# Patient Record
Sex: Male | Born: 1935
Health system: Southern US, Community
[De-identification: ages and names within clinical notes are randomized; demographics above are authoritative.]

## PROBLEM LIST (undated history)

## (undated) DIAGNOSIS — I472 Ventricular tachycardia: Secondary | ICD-10-CM

## (undated) DIAGNOSIS — Z955 Presence of coronary angioplasty implant and graft: Secondary | ICD-10-CM

## (undated) DIAGNOSIS — R319 Hematuria, unspecified: Secondary | ICD-10-CM

## (undated) DIAGNOSIS — Z950 Presence of cardiac pacemaker: Secondary | ICD-10-CM

## (undated) DIAGNOSIS — D494 Neoplasm of unspecified behavior of bladder: Secondary | ICD-10-CM

## (undated) DIAGNOSIS — I739 Peripheral vascular disease, unspecified: Secondary | ICD-10-CM

## (undated) DIAGNOSIS — H409 Unspecified glaucoma: Secondary | ICD-10-CM

## (undated) DIAGNOSIS — I519 Heart disease, unspecified: Secondary | ICD-10-CM

## (undated) DIAGNOSIS — C8590 Non-Hodgkin lymphoma, unspecified, unspecified site: Secondary | ICD-10-CM

## (undated) DIAGNOSIS — I10 Essential (primary) hypertension: Secondary | ICD-10-CM

## (undated) DIAGNOSIS — H269 Unspecified cataract: Secondary | ICD-10-CM

## (undated) DIAGNOSIS — K219 Gastro-esophageal reflux disease without esophagitis: Secondary | ICD-10-CM

## (undated) DIAGNOSIS — I2510 Atherosclerotic heart disease of native coronary artery without angina pectoris: Secondary | ICD-10-CM

## (undated) DIAGNOSIS — M1990 Unspecified osteoarthritis, unspecified site: Secondary | ICD-10-CM

## (undated) DIAGNOSIS — Z951 Presence of aortocoronary bypass graft: Secondary | ICD-10-CM

## (undated) DIAGNOSIS — I442 Atrioventricular block, complete: Secondary | ICD-10-CM

## (undated) DIAGNOSIS — Z973 Presence of spectacles and contact lenses: Secondary | ICD-10-CM

## (undated) DIAGNOSIS — I252 Old myocardial infarction: Secondary | ICD-10-CM

## (undated) DIAGNOSIS — E785 Hyperlipidemia, unspecified: Secondary | ICD-10-CM

## (undated) DIAGNOSIS — I779 Disorder of arteries and arterioles, unspecified: Secondary | ICD-10-CM

## (undated) DIAGNOSIS — I255 Ischemic cardiomyopathy: Secondary | ICD-10-CM

## (undated) HISTORY — PX: CORONARY ARTERY BYPASS GRAFT: SHX141

## (undated) HISTORY — DX: Peripheral vascular disease, unspecified: I73.9

## (undated) HISTORY — PX: POPLITEAL ARTERY STENT: SHX2243

## (undated) HISTORY — PX: SALIVARY GLAND SURGERY: SHX768

## (undated) HISTORY — DX: Essential (primary) hypertension: I10

## (undated) HISTORY — PX: CARDIAC PACEMAKER PLACEMENT: SHX583

## (undated) HISTORY — DX: Disorder of arteries and arterioles, unspecified: I77.9

## (undated) HISTORY — DX: Hyperlipidemia, unspecified: E78.5

## (undated) HISTORY — PX: ANAL FISSURE REPAIR: SHX2312

## (undated) HISTORY — DX: Atherosclerotic heart disease of native coronary artery without angina pectoris: I25.10

## (undated) HISTORY — PX: CARDIOVASCULAR STRESS TEST: SHX262

## (undated) HISTORY — DX: Heart disease, unspecified: I51.9

## (undated) HISTORY — DX: Ventricular tachycardia: I47.2

## (undated) HISTORY — PX: CORONARY ANGIOPLASTY WITH STENT PLACEMENT: SHX49

## (undated) HISTORY — PX: OTHER SURGICAL HISTORY: SHX169

---

## 1998-10-17 ENCOUNTER — Ambulatory Visit (HOSPITAL_COMMUNITY): Admission: RE | Admit: 1998-10-17 | Discharge: 1998-10-17 | Payer: Self-pay | Admitting: Cardiology

## 2000-03-13 ENCOUNTER — Encounter: Payer: Self-pay | Admitting: Cardiology

## 2000-03-13 ENCOUNTER — Encounter: Admission: RE | Admit: 2000-03-13 | Discharge: 2000-03-13 | Payer: Self-pay | Admitting: Cardiology

## 2000-04-09 ENCOUNTER — Ambulatory Visit (HOSPITAL_COMMUNITY): Admission: RE | Admit: 2000-04-09 | Discharge: 2000-04-09 | Payer: Self-pay | Admitting: Cardiology

## 2000-04-09 ENCOUNTER — Encounter: Payer: Self-pay | Admitting: Cardiology

## 2000-04-09 HISTORY — PX: CARDIAC CATHETERIZATION: SHX172

## 2000-05-01 ENCOUNTER — Encounter (HOSPITAL_COMMUNITY): Admission: RE | Admit: 2000-05-01 | Discharge: 2000-07-30 | Payer: Self-pay | Admitting: Cardiology

## 2000-10-21 ENCOUNTER — Encounter (HOSPITAL_COMMUNITY): Admission: RE | Admit: 2000-10-21 | Discharge: 2001-01-19 | Payer: Self-pay | Admitting: Cardiology

## 2000-12-09 ENCOUNTER — Ambulatory Visit (HOSPITAL_COMMUNITY): Admission: RE | Admit: 2000-12-09 | Discharge: 2000-12-09 | Payer: Self-pay | Admitting: General Surgery

## 2000-12-31 ENCOUNTER — Ambulatory Visit (HOSPITAL_COMMUNITY): Admission: RE | Admit: 2000-12-31 | Discharge: 2000-12-31 | Payer: Self-pay | Admitting: Cardiovascular Disease

## 2000-12-31 ENCOUNTER — Encounter: Payer: Self-pay | Admitting: Cardiovascular Disease

## 2006-01-01 ENCOUNTER — Encounter: Admission: RE | Admit: 2006-01-01 | Discharge: 2006-01-01 | Payer: Self-pay | Admitting: Cardiology

## 2006-01-04 ENCOUNTER — Ambulatory Visit (HOSPITAL_COMMUNITY): Admission: RE | Admit: 2006-01-04 | Discharge: 2006-01-05 | Payer: Self-pay | Admitting: Cardiology

## 2006-01-04 DIAGNOSIS — Z955 Presence of coronary angioplasty implant and graft: Secondary | ICD-10-CM

## 2006-01-04 HISTORY — DX: Presence of coronary angioplasty implant and graft: Z95.5

## 2008-04-27 ENCOUNTER — Encounter: Admission: RE | Admit: 2008-04-27 | Discharge: 2008-04-27 | Payer: Self-pay | Admitting: Cardiovascular Disease

## 2008-05-04 ENCOUNTER — Ambulatory Visit (HOSPITAL_COMMUNITY): Admission: RE | Admit: 2008-05-04 | Discharge: 2008-05-04 | Payer: Self-pay | Admitting: Cardiovascular Disease

## 2008-08-04 ENCOUNTER — Inpatient Hospital Stay (HOSPITAL_COMMUNITY): Admission: RE | Admit: 2008-08-04 | Discharge: 2008-08-05 | Payer: Self-pay | Admitting: Cardiology

## 2009-05-11 ENCOUNTER — Encounter: Admission: RE | Admit: 2009-05-11 | Discharge: 2009-05-11 | Payer: Self-pay | Admitting: Family Medicine

## 2009-05-17 ENCOUNTER — Other Ambulatory Visit: Admission: RE | Admit: 2009-05-17 | Discharge: 2009-05-17 | Payer: Self-pay | Admitting: Otolaryngology

## 2009-06-22 ENCOUNTER — Ambulatory Visit: Payer: Self-pay | Admitting: Oncology

## 2009-06-23 LAB — CBC WITH DIFFERENTIAL/PLATELET
Basophils Absolute: 0 10*3/uL (ref 0.0–0.1)
Eosinophils Absolute: 0.3 10*3/uL (ref 0.0–0.5)
HGB: 13.3 g/dL (ref 13.0–17.1)
MCV: 91.5 fL (ref 79.3–98.0)
MONO#: 0.7 10*3/uL (ref 0.1–0.9)
NEUT#: 3 10*3/uL (ref 1.5–6.5)
Platelets: 197 10*3/uL (ref 140–400)
RBC: 4.19 10*6/uL — ABNORMAL LOW (ref 4.20–5.82)
RDW: 13.4 % (ref 11.0–14.6)
WBC: 5.3 10*3/uL (ref 4.0–10.3)

## 2009-06-23 LAB — COMPREHENSIVE METABOLIC PANEL
Albumin: 4.4 g/dL (ref 3.5–5.2)
BUN: 18 mg/dL (ref 6–23)
CO2: 29 mEq/L (ref 19–32)
Calcium: 9.4 mg/dL (ref 8.4–10.5)
Glucose, Bld: 103 mg/dL — ABNORMAL HIGH (ref 70–99)
Potassium: 4.1 mEq/L (ref 3.5–5.3)
Sodium: 140 mEq/L (ref 135–145)
Total Protein: 7.5 g/dL (ref 6.0–8.3)

## 2009-06-23 LAB — LACTATE DEHYDROGENASE: LDH: 181 U/L (ref 94–250)

## 2009-06-27 ENCOUNTER — Ambulatory Visit (HOSPITAL_COMMUNITY): Admission: RE | Admit: 2009-06-27 | Discharge: 2009-06-27 | Payer: Self-pay | Admitting: Oncology

## 2009-07-28 ENCOUNTER — Ambulatory Visit: Payer: Self-pay | Admitting: Oncology

## 2009-08-06 HISTORY — PX: SUPERFICIAL LYMPH NODE BIOPSY / EXCISION: SUR127

## 2009-09-27 ENCOUNTER — Ambulatory Visit: Payer: Self-pay | Admitting: Oncology

## 2009-09-28 LAB — COMPREHENSIVE METABOLIC PANEL
ALT: 24 U/L (ref 0–53)
BUN: 18 mg/dL (ref 6–23)
CO2: 26 mEq/L (ref 19–32)
Calcium: 9.3 mg/dL (ref 8.4–10.5)
Chloride: 102 mEq/L (ref 96–112)
Creatinine, Ser: 1.1 mg/dL (ref 0.40–1.50)
Total Bilirubin: 1 mg/dL (ref 0.3–1.2)

## 2009-09-28 LAB — URIC ACID: Uric Acid, Serum: 5.5 mg/dL (ref 4.0–7.8)

## 2009-09-28 LAB — CBC WITH DIFFERENTIAL/PLATELET
BASO%: 0.3 % (ref 0.0–2.0)
Basophils Absolute: 0 10*3/uL (ref 0.0–0.1)
HCT: 40.5 % (ref 38.4–49.9)
HGB: 14.2 g/dL (ref 13.0–17.1)
LYMPH%: 19.4 % (ref 14.0–49.0)
MCH: 31.9 pg (ref 27.2–33.4)
MCHC: 35.1 g/dL (ref 32.0–36.0)
MONO#: 0.6 10*3/uL (ref 0.1–0.9)
NEUT%: 62.4 % (ref 39.0–75.0)
Platelets: 164 10*3/uL (ref 140–400)
WBC: 4.8 10*3/uL (ref 4.0–10.3)

## 2009-09-28 LAB — HEPATITIS B SURFACE ANTIGEN: Hepatitis B Surface Ag: NEGATIVE

## 2009-09-28 LAB — LACTATE DEHYDROGENASE: LDH: 191 U/L (ref 94–250)

## 2009-09-28 LAB — HEPATITIS B CORE ANTIBODY, TOTAL: Hep B Core Total Ab: NEGATIVE

## 2009-10-14 ENCOUNTER — Ambulatory Visit (HOSPITAL_COMMUNITY): Admission: RE | Admit: 2009-10-14 | Discharge: 2009-10-14 | Payer: Self-pay | Admitting: Oncology

## 2009-10-14 LAB — CBC WITH DIFFERENTIAL/PLATELET
Basophils Absolute: 0 10*3/uL (ref 0.0–0.1)
EOS%: 9.5 % — ABNORMAL HIGH (ref 0.0–7.0)
Eosinophils Absolute: 0.3 10*3/uL (ref 0.0–0.5)
HGB: 11.5 g/dL — ABNORMAL LOW (ref 13.0–17.1)
MCH: 32.6 pg (ref 27.2–33.4)
NEUT#: 2.4 10*3/uL (ref 1.5–6.5)
RDW: 13.6 % (ref 11.0–14.6)
WBC: 3.4 10*3/uL — ABNORMAL LOW (ref 4.0–10.3)
lymph#: 0.4 10*3/uL — ABNORMAL LOW (ref 0.9–3.3)

## 2009-10-14 LAB — URINALYSIS, MICROSCOPIC - CHCC
Ketones: NEGATIVE mg/dL
Leukocyte Esterase: NEGATIVE
Nitrite: NEGATIVE
Protein: <30 mg/dL
RBC count: NEGATIVE (ref 0–2)
WBC, UA: NEGATIVE (ref 0–2)

## 2009-10-14 LAB — COMPREHENSIVE METABOLIC PANEL
AST: 34 U/L (ref 0–37)
Albumin: 4.1 g/dL (ref 3.5–5.2)
BUN: 18 mg/dL (ref 6–23)
Calcium: 8.6 mg/dL (ref 8.4–10.5)
Chloride: 101 mEq/L (ref 96–112)
Potassium: 4.7 mEq/L (ref 3.5–5.3)

## 2009-10-15 LAB — URINE CULTURE

## 2009-10-27 ENCOUNTER — Ambulatory Visit: Payer: Self-pay | Admitting: Oncology

## 2009-10-31 LAB — CBC WITH DIFFERENTIAL/PLATELET
BASO%: 1.2 % (ref 0.0–2.0)
EOS%: 5.5 % (ref 0.0–7.0)
HCT: 37.3 % — ABNORMAL LOW (ref 38.4–49.9)
LYMPH%: 18.9 % (ref 14.0–49.0)
MCH: 31.5 pg (ref 27.2–33.4)
MCHC: 35.1 g/dL (ref 32.0–36.0)
MCV: 89.7 fL (ref 79.3–98.0)
NEUT%: 57.4 % (ref 39.0–75.0)
Platelets: 170 10*3/uL (ref 140–400)

## 2009-10-31 LAB — COMPREHENSIVE METABOLIC PANEL
ALT: 42 U/L (ref 0–53)
AST: 40 U/L — ABNORMAL HIGH (ref 0–37)
BUN: 15 mg/dL (ref 6–23)
Creatinine, Ser: 0.97 mg/dL (ref 0.40–1.50)
Total Bilirubin: 1.2 mg/dL (ref 0.3–1.2)

## 2009-11-15 LAB — LIPID PANEL
HDL: 44 mg/dL (ref >39)
LDL Cholesterol: 45 mg/dL (ref 0–99)
Total CHOL/HDL Ratio: 2.7 Ratio

## 2009-11-15 LAB — CBC WITH DIFFERENTIAL/PLATELET
Eosinophils Absolute: 0.2 10*3/uL (ref 0.0–0.5)
HCT: 34.1 % — ABNORMAL LOW (ref 38.4–49.9)
LYMPH%: 13.1 % — ABNORMAL LOW (ref 14.0–49.0)
MCHC: 35.5 g/dL (ref 32.0–36.0)
MCV: 90.7 fL (ref 79.3–98.0)
MONO#: 0.8 10*3/uL (ref 0.1–0.9)
MONO%: 38 % — ABNORMAL HIGH (ref 0.0–14.0)
NEUT%: 38.6 % — ABNORMAL LOW (ref 39.0–75.0)
Platelets: 123 10*3/uL — ABNORMAL LOW (ref 140–400)
WBC: 2.1 10*3/uL — ABNORMAL LOW (ref 4.0–10.3)

## 2009-11-28 ENCOUNTER — Ambulatory Visit: Payer: Self-pay | Admitting: Oncology

## 2009-11-28 LAB — CBC WITH DIFFERENTIAL/PLATELET
BASO%: 1.2 % (ref 0.0–2.0)
MCHC: 35.7 g/dL (ref 32.0–36.0)
MONO#: 0.5 10*3/uL (ref 0.1–0.9)
RBC: 3.95 10*6/uL — ABNORMAL LOW (ref 4.20–5.82)
RDW: 16.4 % — ABNORMAL HIGH (ref 11.0–14.6)
WBC: 2.6 10*3/uL — ABNORMAL LOW (ref 4.0–10.3)
lymph#: 0.5 10*3/uL — ABNORMAL LOW (ref 0.9–3.3)
nRBC: 0 % (ref 0–0)

## 2009-12-05 LAB — CBC WITH DIFFERENTIAL/PLATELET
EOS%: 4.3 % (ref 0.0–7.0)
Eosinophils Absolute: 0.1 10*3/uL (ref 0.0–0.5)
LYMPH%: 16.2 % (ref 14.0–49.0)
MCH: 32.6 pg (ref 27.2–33.4)
MCV: 91.4 fL (ref 79.3–98.0)
MONO%: 19.6 % — ABNORMAL HIGH (ref 0.0–14.0)
Platelets: 85 10*3/uL — ABNORMAL LOW (ref 140–400)
RBC: 3.83 10*6/uL — ABNORMAL LOW (ref 4.20–5.82)
RDW: 16.7 % — ABNORMAL HIGH (ref 11.0–14.6)

## 2009-12-05 LAB — COMPREHENSIVE METABOLIC PANEL
AST: 24 U/L (ref 0–37)
Albumin: 4.7 g/dL (ref 3.5–5.2)
Alkaline Phosphatase: 38 U/L — ABNORMAL LOW (ref 39–117)
BUN: 18 mg/dL (ref 6–23)
Glucose, Bld: 118 mg/dL — ABNORMAL HIGH (ref 70–99)
Potassium: 4.2 mEq/L (ref 3.5–5.3)
Sodium: 140 mEq/L (ref 135–145)
Total Bilirubin: 0.9 mg/dL (ref 0.3–1.2)

## 2009-12-14 LAB — CBC WITH DIFFERENTIAL/PLATELET
Basophils Absolute: 0 10*3/uL (ref 0.0–0.1)
EOS%: 5.3 % (ref 0.0–7.0)
Eosinophils Absolute: 0.2 10*3/uL (ref 0.0–0.5)
HGB: 12.1 g/dL — ABNORMAL LOW (ref 13.0–17.1)
LYMPH%: 9.5 % — ABNORMAL LOW (ref 14.0–49.0)
MCH: 33.1 pg (ref 27.2–33.4)
MCV: 92.9 fL (ref 79.3–98.0)
MONO%: 42 % — ABNORMAL HIGH (ref 0.0–14.0)
NEUT#: 1.2 10*3/uL — ABNORMAL LOW (ref 1.5–6.5)
NEUT%: 42.5 % (ref 39.0–75.0)
Platelets: 86 10*3/uL — ABNORMAL LOW (ref 140–400)

## 2009-12-30 ENCOUNTER — Ambulatory Visit: Payer: Self-pay | Admitting: Oncology

## 2010-01-03 LAB — CBC WITH DIFFERENTIAL/PLATELET
Basophils Absolute: 0 10*3/uL (ref 0.0–0.1)
Eosinophils Absolute: 0.3 10*3/uL (ref 0.0–0.5)
HCT: 35.6 % — ABNORMAL LOW (ref 38.4–49.9)
HGB: 12.6 g/dL — ABNORMAL LOW (ref 13.0–17.1)
LYMPH%: 10 % — ABNORMAL LOW (ref 14.0–49.0)
MCV: 97 fL (ref 79.3–98.0)
MONO#: 0.6 10*3/uL (ref 0.1–0.9)
NEUT#: 4.7 10*3/uL (ref 1.5–6.5)
Platelets: 125 10*3/uL — ABNORMAL LOW (ref 140–400)
RBC: 3.67 10*6/uL — ABNORMAL LOW (ref 4.20–5.82)
WBC: 6.2 10*3/uL (ref 4.0–10.3)

## 2010-01-03 LAB — COMPREHENSIVE METABOLIC PANEL
Alkaline Phosphatase: 49 U/L (ref 39–117)
BUN: 17 mg/dL (ref 6–23)
CO2: 22 mEq/L (ref 19–32)
Creatinine, Ser: 0.97 mg/dL (ref 0.40–1.50)
Glucose, Bld: 103 mg/dL — ABNORMAL HIGH (ref 70–99)
Total Bilirubin: 0.8 mg/dL (ref 0.3–1.2)
Total Protein: 7.1 g/dL (ref 6.0–8.3)

## 2010-01-19 LAB — CBC WITH DIFFERENTIAL/PLATELET
Basophils Absolute: 0 10*3/uL (ref 0.0–0.1)
Eosinophils Absolute: 0.2 10*3/uL (ref 0.0–0.5)
HCT: 33.6 % — ABNORMAL LOW (ref 38.4–49.9)
HGB: 12.1 g/dL — ABNORMAL LOW (ref 13.0–17.1)
LYMPH%: 12.7 % — ABNORMAL LOW (ref 14.0–49.0)
MCV: 100.8 fL — ABNORMAL HIGH (ref 79.3–98.0)
MONO%: 37.5 % — ABNORMAL HIGH (ref 0.0–14.0)
NEUT#: 0.5 10*3/uL — ABNORMAL LOW (ref 1.5–6.5)
Platelets: 148 10*3/uL (ref 140–400)

## 2010-01-19 LAB — COMPREHENSIVE METABOLIC PANEL
Albumin: 4.2 g/dL (ref 3.5–5.2)
Alkaline Phosphatase: 39 U/L (ref 39–117)
BUN: 16 mg/dL (ref 6–23)
Glucose, Bld: 115 mg/dL — ABNORMAL HIGH (ref 70–99)
Potassium: 4.2 mEq/L (ref 3.5–5.3)
Total Bilirubin: 1.3 mg/dL — ABNORMAL HIGH (ref 0.3–1.2)

## 2010-01-20 LAB — MANUAL DIFFERENTIAL
ALC: 0.1 10*3/uL — ABNORMAL LOW (ref 0.9–3.3)
Band Neutrophils: 3 % (ref 0–10)
Blasts: 0 % (ref 0–0)
EOS: 14 % — ABNORMAL HIGH (ref 0–7)
Other Cell: 0 % (ref 0–0)
PLT EST: DECREASED
PROMYELO: 0 % (ref 0–0)
SEG: 40 % (ref 38–77)
Variant Lymph: 0 % (ref 0–0)
nRBC: 0 % (ref 0–0)

## 2010-01-20 LAB — CBC WITH DIFFERENTIAL/PLATELET
HCT: 34.7 % — ABNORMAL LOW (ref 38.4–49.9)
HGB: 12.4 g/dL — ABNORMAL LOW (ref 13.0–17.1)
MCH: 35 pg — ABNORMAL HIGH (ref 27.2–33.4)
WBC: 1.8 10*3/uL — ABNORMAL LOW (ref 4.0–10.3)
nRBC: 0 % (ref 0–0)

## 2010-01-30 ENCOUNTER — Ambulatory Visit: Payer: Self-pay | Admitting: Oncology

## 2010-01-31 LAB — CBC WITH DIFFERENTIAL/PLATELET
BASO%: 1.1 % (ref 0.0–2.0)
Basophils Absolute: 0 10*3/uL (ref 0.0–0.1)
EOS%: 11.7 % — ABNORMAL HIGH (ref 0.0–7.0)
HCT: 34 % — ABNORMAL LOW (ref 38.4–49.9)
HGB: 12.1 g/dL — ABNORMAL LOW (ref 13.0–17.1)
LYMPH%: 23.3 % (ref 14.0–49.0)
MCH: 34.9 pg — ABNORMAL HIGH (ref 27.2–33.4)
MCHC: 35.6 g/dL (ref 32.0–36.0)
MCV: 98 fL (ref 79.3–98.0)
MONO%: 17.2 % — ABNORMAL HIGH (ref 0.0–14.0)
NEUT%: 46.7 % (ref 39.0–75.0)
Platelets: 77 10*3/uL — ABNORMAL LOW (ref 140–400)

## 2010-02-10 LAB — CBC WITH DIFFERENTIAL/PLATELET
Basophils Absolute: 0 10*3/uL (ref 0.0–0.1)
Eosinophils Absolute: 0.1 10*3/uL (ref 0.0–0.5)
HGB: 11.5 g/dL — ABNORMAL LOW (ref 13.0–17.1)
MCV: 97.9 fL (ref 79.3–98.0)
NEUT#: 1.6 10*3/uL (ref 1.5–6.5)
RDW: 14.4 % (ref 11.0–14.6)
lymph#: 0.5 10*3/uL — ABNORMAL LOW (ref 0.9–3.3)

## 2010-03-08 ENCOUNTER — Ambulatory Visit: Payer: Self-pay | Admitting: Oncology

## 2010-03-10 LAB — CBC WITH DIFFERENTIAL/PLATELET
BASO%: 0.1 % (ref 0.0–2.0)
Basophils Absolute: 0 10*3/uL (ref 0.0–0.1)
EOS%: 6.1 % (ref 0.0–7.0)
HGB: 12.3 g/dL — ABNORMAL LOW (ref 13.0–17.1)
MCH: 36.5 pg — ABNORMAL HIGH (ref 27.2–33.4)
RDW: 14.2 % (ref 11.0–14.6)
WBC: 2.3 10*3/uL — ABNORMAL LOW (ref 4.0–10.3)
lymph#: 0.3 10*3/uL — ABNORMAL LOW (ref 0.9–3.3)

## 2010-05-09 ENCOUNTER — Ambulatory Visit: Payer: Self-pay | Admitting: Oncology

## 2010-05-11 LAB — CBC WITH DIFFERENTIAL/PLATELET
BASO%: 0 % (ref 0.0–2.0)
EOS%: 6 % (ref 0.0–7.0)
HCT: 33.7 % — ABNORMAL LOW (ref 38.4–49.9)
MCH: 36.1 pg — ABNORMAL HIGH (ref 27.2–33.4)
MCHC: 36.3 g/dL — ABNORMAL HIGH (ref 32.0–36.0)
NEUT%: 71.7 % (ref 39.0–75.0)
RBC: 3.38 10*6/uL — ABNORMAL LOW (ref 4.20–5.82)
RDW: 13.6 % (ref 11.0–14.6)
lymph#: 0.3 10*3/uL — ABNORMAL LOW (ref 0.9–3.3)

## 2010-06-06 DIAGNOSIS — C8590 Non-Hodgkin lymphoma, unspecified, unspecified site: Secondary | ICD-10-CM

## 2010-06-06 HISTORY — DX: Non-Hodgkin lymphoma, unspecified, unspecified site: C85.90

## 2010-07-27 ENCOUNTER — Ambulatory Visit: Payer: Self-pay | Admitting: Oncology

## 2010-08-01 LAB — CBC WITH DIFFERENTIAL/PLATELET
BASO%: 0.3 % (ref 0.0–2.0)
EOS%: 7.9 % — ABNORMAL HIGH (ref 0.0–7.0)
HCT: 34.2 % — ABNORMAL LOW (ref 38.4–49.9)
MCH: 34.2 pg — ABNORMAL HIGH (ref 27.2–33.4)
MCHC: 36 g/dL (ref 32.0–36.0)
MONO#: 0.4 10*3/uL (ref 0.1–0.9)
RBC: 3.6 10*6/uL — ABNORMAL LOW (ref 4.20–5.82)
RDW: 12.9 % (ref 11.0–14.6)
WBC: 3.2 10*3/uL — ABNORMAL LOW (ref 4.0–10.3)
lymph#: 0.7 10*3/uL — ABNORMAL LOW (ref 0.9–3.3)

## 2010-12-19 NOTE — Discharge Summary (Signed)
David [name], FISCHLER NO.:  [id number]   MEDICAL RECORD NO.:  [id number]          PATIENT TYPE:  INP   LOCATION:  6524                         FACILITY:  [facility]   PHYSICIAN:  Ritta Slot, MD     DATE OF BIRTH:  10/15/1932   DATE OF ADMISSION:  08/04/2008  DATE OF DISCHARGE:  08/05/2008                               DISCHARGE SUMMARY   DISCHARGE DIAGNOSES:  1. Generator change this admission, requiring early revision because      of noncapture, stable at discharge.  2. History of complete heart block, the patient has original pacemaker      implanted in 1995 for Dr. Laneta Simmers, he is pacer dependent.  3. Coronary disease, coronary artery bypass grafting in 1990 with a      stent to the saphenous vein graft to posterior descending artery in      June 2007, this was a drug-eluting stent.  4. Peripheral vascular disease with left superficial femoral artery      occlusion, treated medically.  5. Left ventricular dysfunction with an ejection fraction of 35-45% by      echocardiogram in June 2009, the patient is essentially      asymptomatic from this.   HOSPITAL COURSE:  The patient is a 77 year old male followed by Dr.  Alanda Amass, Dr. Arvilla Market, and Dr. Elsie Lincoln.  He had bypass surgery in the  past.  He had a pacemaker for symptomatic heart block in 1995 with a  generator change in 2002.  He has been followed by Dr. Alanda Amass.  Dr.  Alanda Amass felt that the patient was not getting a full benefit for a  rate responsiveness and that he was near end-of-life and he planned to  admit him for elective generator change.  Please see Dr. Kandis Cocking  complete office note from July 28, 2008.  The patient was admitted  electively on August 04, 2008 for generator change by Dr. Lynnea Ferrier.  See his operative note for complete details.  The patient had a  temporary pacer placed because he is pacer dependent.  Generator was  changed, but within 2 hours in the holding area he had a  noncapture.  He  was taken back to the cath lab and another temporary pacer was placed  and his pacemaker was revised.  He tolerated this well.  On the morning  of August 05, 2008, his pacer has been checked out by the pacer rep  and it is felt to be functioning normally.  He is in DDDR mode.  Dr.  Lynnea Ferrier feels the patient can be discharged.  He will follow up with Dr.  Alanda Amass in a week for wound check.  Dr. Lynnea Ferrier has told of no Plavix  or Pletal until Monday, July 08, 2008.  Dr. Lynnea Ferrier has also  instructed the patient to keep the dressing on until he sees Dr.  Alanda Amass in the office.   ADMISSION LABORATORY DATA:  White count 5.1, hemoglobin 13.5, hematocrit  37.9, and platelets 162.  INR 1.0.  TSH 2.46.  Sodium 145, potassium  4.3, BUN 15, and creatinine 0.8.  Urinalysis unremarkable.  Telemetry is  paced.   DISPOSITION:  The patient is discharged in stable condition and will  have a wound check in a week in the office.   DISCHARGE MEDICATIONS:  1. Lisinopril 20 mg a day.  2. Coreg 12.5 mg b.i.d.  3. Crestor 40 mg a day.  4. Lumigan ophthalmic drops, both eyes daily.  5. Lovaza 1 gram q.i.d.  6. Glucosamine b.i.d.  7. Multivitamin daily.  8. Aspirin 81 mg a day.  9. Zantac 115 mg a day.  10.Claritin 10 mg a day.   His Pletal 50 mg twice a day and Plavix 75 mg a day will be held until  Monday and we have added Augmentin 625 mg b.i.d. for 5 days.      Abelino Derrick, P.A.      Ritta Slot, MD  Electronically Signed    [name]  D:  08/05/2008  T:  08/05/2008  Job:  [id number]   cc:   Gerlene Burdock A. Alanda Amass, M.D.  Madaline Savage, M.D.  Donia Guiles, M.D.

## 2010-12-19 NOTE — Cardiovascular Report (Signed)
NAMEZEUS, MARQUIS                 ACCOUNT NO.:  [id number]   MEDICAL RECORD NO.:  [id number]          PATIENT TYPE:  AMB   LOCATION:  SDS                          FACILITY:  [facility]   PHYSICIAN:  Richard A. Alanda Amass, M.D.DATE OF BIRTH:  [birth year]   DATE OF PROCEDURE:  05/04/2008  DATE OF DISCHARGE:  05/04/2008                            CARDIAC CATHETERIZATION   PERIPHERAL VASCULAR ANGIOGRAM   PROCEDURES:  Retrograde abdominal aortic catheterization, abdominal  aortic angiogram midstream PA projection, bilateral lower extremity  runoff using step table imaging, bilateral iliac angiography PA  projection, spot films of left popliteal angiogram, all done using  digital subtraction angiography imaging.   Visipaque dye used throughout the procedure.   OPERATING PHYSICIAN:  Richard A. Alanda Amass, MD   COMPLICATION:  None.   ACCESS:  Right common femoral artery, percutaneous technique, a 5-French  sheath and catheters.   ANESTHESIA:  Xylocaine 1%, 25 mcg of fentanyl IV, 2 mg of Versed IV, and  5 mg of Valium p.o. premedication.   BRIEF HISTORY:  Mr. Overley is a 77 year old white married father of three  who is a cardiac patient of Dr. Chanda Busing and medical patient of  Dr. Donia Guiles.  He is admitted for peripheral angiography after  informed consent was obtained because of chronic Fontaine IIb left lower  extremity claudication, and Doppler evaluation showing high grade 481  cm/sec velocity in the left popliteal compatible with high-grade left  popliteal stenosis and LABI 0.97 and RABI 1.20 with normal velocities on  duplex imaging on October 10, 2007, with associated Fontaine IIb  claudication.   He also has a history of coronary artery disease with remote CABG in  1995 by Dr. Laneta Simmers and subsequent DES stenting of the SVG to the PDA and  SVG to sequential to the marginal branches of the circumflex in 2007.  LV dysfunction with EF of 35-40% in last 2D echo of June 2009,  without  ischemia on followup Cardiolite of June 2009.  Associated PTVP implanted  by Dr. Laneta Simmers for intermittent CHB in 1995 and EOL generator change with  a Guidant discovered 2 generator by me on Dec 31, 2000, now being  followed for end-of-life characteristics with approximately 6 months  estimated battery life left on TELETRACE.  Associated history of  hypertension and hyperlipidemia on medical therapy.   The patient was brought to the second floor of CP Lab in a  postabsorptive state as a same day admission.  He was premedicated with  5 mg of Valium.  Informed consent was obtained to proceed with  diagnostic angiography.  The CRFA was entered with an anterior puncture  using 18 thin-walled needle and a 5-French short sidearm sheath was  inserted without difficulty where that would probably went through a  calcific plaque, I feel.  There was no undue bleeding during the  procedure and 5-French catheters were used throughout.  A 5-French  pigtail catheter was placed above the renal arteries and the abdominal  aorta and abdominal angiogram was done with DSA at 20 mL, 20 mL per  second.  Catheters  were down above the iliac bifurcation and repeat  angiography was done in the PA projection at 20 mL, 20 mL per second.  Bilateral lower extremity runoff was done using DSA and step table  imaging at 77 mL for 7 mL per second.  Because of delayed imaging at the  left popliteal obstruction,  it was elected to take spot films.  Using  guidewire exchange, the pigtail catheter was exchanged for a 5-French  crossover IMA catheter with access to the left iliac system.  The  guidewire was used to access the left external iliac, and the catheter  was exchanged for a 5-French indwelling catheter.  Spot DSA imaging of  the left popliteal, which was done at 10 mL through this catheter in the  oblique projection.  Digital cines were reviewed by myself and Dr.  Allyson Sabal.  Sidearm sheath was flushed, catheter  was removed, and the  patient was transferred to the holding area for sheath removal and  pressure hemostasis.  He tolerated the procedure well.   Arterial pressures were monitored throughout the procedure and average  150-160 mmHg.  He was AV pacing with full capture and a pacer-induced  LVBBB throughout the procedure.   Abdominal aortic angiogram in the midstream PA projection showed a  patent celiac and SMA axis.  Both renal arteries were widely patent and  single with no stenosis.  The abdominal aorta was calcified diffusely in  both lateral margins at about 3+, but this was nonobstructive.  The IMA  was intact.  The calcification of the infrarenal aorta extended into the  proximal iliacs bilaterally.   The common iliacs were tortuous and widely patent bilaterally.  Hypogastrics were intact.  No significant stenosis bilaterally.   The external iliacs showed one area of 20% eccentric smooth narrowing of  the EIA with a normal appearing, but tortuous LEIA.   The SFA profunda junctions were intact and the profundas were intact  bilaterally.   The right SFA had 40-50% fairly focal mid narrowing and 50% narrowing at  [facility] canal of the SFA.  There was diffuse calcification, but good  flow.  The right popliteal had no significant stenosis.  There was a 70%  lesion of eccentric of the right mid tibial peroneal extending into the  PTA branch.  There was delayed filling and only in the proximal peroneal  and RAT were well visualized.  The only vessel visualized late at the  foot was the posterior tibial, but the patient had good Doppler flow in  the other vessels preoperatively.   The left SFA had 50% mid lesion, diffuse irregularity throughout the  SFA, and then there was total occlusion of the distal SFA popliteal  junction above the knee.  This was relatively short-segment occlusion of  approximately 3-4 cm; however, there was a very large well-formed  collateral at the proximal  origin of the occlusion, which filled the  peroneal and the right posterior tibial.  Geniculate collaterals then  filled the anterior tibial artery.  The tibial peroneal was underfilled,  but did appear to be good collateral flow there, but we cannot comment  on any possible lesions in that area, and there was some underfilling of  the infragenicular popliteal artery, but this was patent below the  occlusion.  The occlusion extended to the knee joint line.  There was  three-vessel runoff to the left lower extremity.   DISCUSSION:  The patient's history is as outlined above.  He has  symptomatic Fontaine IIb claudication of the left lower extremity, which  is chronic.  He has total relatively short-segment occlusion of the  distal left SFA popliteal extending to the knee joint with collaterals  filling the tibial vessels as outlined.  These are well-formed  collaterals, so I suspect that this is a chronic total occlusion.  This  was unfavorable to consider peripheral intervention because of absence  of any nubbin at the proximal area of occlusion, and the fact that this  does cross the knee, and we would like to try to avoid stenting of this  area.  Possibility of attempting recanalization and atherectomy would  probably be the most favorable PPI consideration; however,  angiographically, it appears relatively unfavorable for intervention.   He is a potential candidate for distal fem-pop bypass consideration  depending upon his level of symptoms.  At present, we will continue  medical therapy and see him back as an outpatient.  He is on chronic  aspirin and Plavix, which we will continue and consider adding Pletal to  his regimen along with regular walking exercise program and see if I can  follow.  We will also follow him closely for end of life of his  pacemaker.   CATHETERIZATION DIAGNOSES:  1. Peripheral arterial disease - chronic left lower extremity Fontaine      IIb  claudication.  2. Total occlusion, distal superficial femoral artery popliteal on the      left with well-formed collaterals as outlined above and three-      vessel runoff in left lower extremity.  3. Moderate right tibial peroneal disease on the right with preserved      three-vessel runoff on the right.  4. Calcific abdominal aorta without stenosis or aneurysm formation.  5. Hypertension; single normal renal arteries bilaterally.  6. Remote coronary artery bypass graft in 1995.  7. Drug-eluting stent stenting saphenous vein graft to posterior      descending artery and saphenous vein graft to obtuse marginal      artery - obtuse marginal artery in 2007.  8. Percutaneous transluminal valvuloplasty in 1995, Dr. Laneta Simmers for      complete heart block with end-of-life generator change, Guidant      discovered 2 generator on Dec 31, 2000, nearing end of life with      close outpatient surveillance.  9. Hypertension.  10.Hyperlipidemia, on therapy.  11.Negative Cardiolite for ischemia in June 2009.  12.Left ventricular dysfunction without congestive heart failure.      Richard A. Alanda Amass, M.D.  Electronically Signed     RAW/MEDQ  D:  05/04/2008  T:  05/05/2008  Job:  [id number]   cc:   [facility]  Donia Guiles, M.D.  Madaline Savage, M.D.

## 2010-12-22 NOTE — Cardiovascular Report (Signed)
[place]  Patient:    David [name], David [name]                        MRN: [id number] Proc. Date: 04/09/00 Adm. Date:  [id number] Attending:  Ophelia Shoulder CC:         Desma Maxim, M.D.             [facility]                        Cardiac Catheterization  PROCEDURES PERFORMED 1. Selective coronary angiography by Judkins technique. 2. Retrograde left heart catheterization. 3. Left ventricular angiography. 4. Selective visualization of the left internal mammary artery graft. 5. Selective visualization of multiple saphenous vein grafts.  COMPLICATIONS:  None.  ENTRY SITE:  Right femoral.  DYE USED:  Omnipaque.  PATIENT PROFILE:  [name] is a 77 year old gentleman who underwent coronary artery bypass grafting in 1990; he has also had a permanent pacemaker implanted for complete heart block.  He recently complained of exertional chest discomfort for which he did not take nitroglycerin; he also complains of some increasing shortness of breath. We scheduled him for cardiac catheterization electively today.  RESULTS Pressures:  The left ventricular pressure was 140/13.  Central aortic pressure was 140/75 with a mean of 105.  Angiographic results:  The left main coronary artery is normal.  The left anterior descending coronary artery contains a 75% stenosis in the midportion of the vessel.  There is a diagonal branch arising distally that is normal. The left internal mammary artery graft fills in a retrograde fashion.  The distal LAD beyond the left internal mammary insertion site is small and essentially occluded but he does get retrograde filling to the apical LAD by way of collateral flow from a right coronary artery acute marginal branch.  The circumflex is 100% occluded at the ostium.  The right coronary artery is 100% occluded in the midportion of the vessel.  The left ventricle shows apical and inferoapical  dyskinesis, compatible with early aneurysm formation. I estimate ejection fraction 45%.  The saphenous vein graft to the first obtuse marginal branch, second obtuse marginal branch and intermediate ramus branch of the circumflex is widely patent with good runoff.  There is some tapering in the proximal portion of the graft but this does not appear to be a diseased segment.  The saphenous vein graft to the distal right coronary artery is also widely patent with good runoff.  The internal mammary artery graft to the LAD contains no lesions along its course.  This graft is becoming slightly atrophic.  The distal anastomotic site is fairly normal.  The distal runoff is poor, as previously stated, but there is collateral flow from the right coronary artery.  FINAL DIAGNOSES 1. Severe native coronary artery disease.    a. Seventy-five percent stenosis of the mid left anterior descending       coronary artery.    b. Right coronary artery occlusion in the midportion of the vessel.    c. One hundred percent occlusion of the proximal left circumflex coronary       artery. 2. Left ventricular dysfunction, new since last catheterization, with a drop    in ejection fraction from 60% to 45% and apical dyskinesis, more severe    than previously noted. 3. Patent coronary artery bypass grafts, as stated above.  PLAN:  The patient  is already on Norvasc, Prinivil and aspirin.  We will add a long-acting nitrate for preload and afterload effects and consider adding Lanoxin if symptoms require it.  Diet and exercise will continue to be stressed. DD:  04/09/00 TD:  04/09/00 Job: [id number]

## 2010-12-22 NOTE — Procedures (Signed)
[place]  Patient:    David [name], GROS                        MRN: [id number] Proc. Date: 12/31/00 Adm. Date:  [id number] Attending:  Ruta Hinds CC:         Madaline Savage, M.D.  [facility]  Desma Maxim, M.D.   Procedure Report  PROCEDURES:  Explantation of expired end-of-life DDDR Intermedics Relay [phone], serial number [id number], generator (implanted November 15, 1993), and implantation of new Guidant Discovery II-DR, model [id number], serial number [id number]. Trouble-shooting and threshold testing existing Intermedics electrodes.  IMPLANTING PHYSICIAN:  Richard A. Alanda Amass, M.D.  COMPLICATIONS:  None.  ESTIMATED BLOOD LOSS:  Approximately 30 cc.  ANESTHESIA:  5 mg Valium p.o. premedication, intermittent Versed and Nubain IV titrated during the procedure for sedation, 1% local Xylocaine.  PREOPERATIVE DIAGNOSES: 1. Implantation of permanent DDDR generator November 15, 1993, Dr. Laneta Simmers, for    complete heart block, symptomatic. 2. End-of-life generator, outpatient surveillance reaching ERI and subsequent    EOL with reversion to VVI mode. 3. Status post coronary artery bypass graft, Dr. Particia Lather, 1995. 4. Systemic hypertension. 5. Hyperlipidemia, on therapy. 6. Recent patent grafts on catheterization April 09, 2000. 7. Left ventricular dysfunction, ejection fraction 45% at last    catheterization, with mild pacer-induced left bundle branch block, not    symptomatic.  POSTOPERATIVE DIAGNOSES: 1. Implantation of permanent DDDR generator November 15, 1993, Dr. Laneta Simmers, for    complete heart block, symptomatic. 2. End-of-life generator, outpatient surveillance reaching ERI and subsequent    EOL with reversion to VVI mode. 3. Status post coronary artery bypass graft, Dr. Particia Lather, 1995. 4. Systemic hypertension. 5. Hyperlipidemia, on therapy. 6. Recent patent grafts on catheterization April 09, 2000. 7. Left ventricular  dysfunction, ejection fraction 45% at last    catheterization, with mild pacer-induced left bundle branch block, not    symptomatic.  BRIEF HISTORY:  Mr. Swing recently had his pacemaker revert to VVI mode.  In retrospect, this was probably related to electrocautery at the time of anal surgery.  He did, however, reach ERI indicators as indicated by full pacemaker interrogation, and was reprogrammed to the DDD mode.  He has chronic _____ incontinence with atrial tracking and ventricular pacing, and it had been programmed to the unipolar atrial output because of high bipolar atrial impedance and relatively increased atrial thresholds.  There was some suggestion of possible internal insulation defect.  It was programmed to the unipolar mode with excellent chronic threshold as an outpatient and good sensing of intrinsic P-waves.  At the time of generator change, we planned to do full electrode testing for adequacy of his currently implanted electrodes.  Atrial electrode is a Pacesetter screw-in, No. 438-05, serial number [id number].  Ventricular electrode 430-07; serial number .  DESCRIPTION OF PROCEDURE:  Patient brought to the sixth floor EP lab in a postabsorptive state.  He was inadvertently not given Valium p.o. on call to the lab, so he was given 2 mg of Versed initially before starting the procedure.  Then he was given intermittent Versed and Nubain for sedation, titrated during the procedure.  The previously-placed generator was in the left chest.  A left infraclavicular curvilinear incision was made below the previous incision in the left subclavicular region.  It was brought back down through the subcutaneous tissue using blunt dissection and electrocautery to control hemostasis.  The fibrous capsule of  the pulse generator was then identified.  The capsule was incised and eventually debrided and removed.  The generator was delivered from the pocket.  The patient was pacing  bipolar.  The ventricular electrode was removed, and temporary pacing was established.  The patient had his own intrinsic rhythm at about 35 per minute with underlying complete heart block.  The electrodes were removed from the generator by opening the locking caps initially.  The electrodes were intact and on inspection appeared to be intact with no insulation defects.  The IS1 pin connectors appeared intact, and there was no blood in the leads.  The atrial electrode was freed up from the proximal underlying fibrous tissue to be further inspected.  Ventricular thresholds were then done.  Unipolar ventricular:  R equals 13.4 MV, resistance 538 Ohms, ventricular thresholds at capture 0.5 V.  Bipolar ventricular:  R equals 13.0 MV, resistance 700 Ohms, minimal threshold 0.6 V.  Atrial thresholds were then recorded.  Unipolar atrial:  P equals 8.0 MV, resistance 308 Ohms, threshold 1.03.  Bipolar atrial:  P equals 6.6 MV, resistance 1480 Ohms, threshold 2.1 V. There was no pocket or pectoral stimulation or diaphragmatic stimulation on PSA testing.  It was noted that the atrial electrode was screwed into the lateral atrial wall but was stable for fluoroscopically, and the RV electrode was in the RV apex, oriented inferiorly, but there was no diaphragmatic stimulation.  The generator conformed to manufacturers specifications on PSA testing with output of 3.2 volts atrium, 3.4 volts ventricle, at pulse width of 0.4 milliseconds.  The pocket was irrigated with 500 mg of kanamycin solution. The generator was then hooked to the electrodes in the proper sequence with the two hex nuts tightened for each electrode.  The generator was delivered into the pocket with the electrodes looped behind.  Sponge count was correct, and the pocket was dry.  Subcutaneous tissue was closed with two separate  running layers of 2-0 Dexon suture, and the skin was closed with 5-0 subcuticular Dexon suture.   Steri-Strips were applied.  Magnet rate at BOL equals 100 ppm.  Magnet rate at [facility] will drop to 85 ppm.  The patient was transferred to the holding area for postoperative programming in stable condition. DD:  12/31/00 TD:  12/31/00 Job: [id number]

## 2010-12-22 NOTE — Op Note (Signed)
[facility]  Patient:    [name], David [name]                        MRN: [id number] Proc. Date: 12/09/00 Adm. Date:  [id number] Attending:  Tempie Donning CC:         Desma Maxim, M.D.   Operative Report  PREOPERATIVE DIAGNOSIS:  Chronic posterior midline anal abscess.  POSTOPERATIVE DIAGNOSIS:  Chronic posterior midline anal abscess.  OPERATION PERFORMED:  Examination under anesthesia, anoscopy, posterior anal fissurectomy and debridement of chronic anal abscess.  SURGEON:  Gita Kudo, M.D.  ANESTHESIA:  General.  INDICATIONS FOR PROCEDURE:  The patient is a 77 year old man with chronic anorectal problems.  Many years ago, he had surgery at another state for this problem.  I have been following him in the office for several months and treated him with conservative local measures for a posterior anal infection. He returned on April 25 having had some pain and drainage.  Physical examination showed posterior midline induration and he comes in for examination under anesthesia.  OPERATIVE FINDINGS:  Anoscopic exam showed no other intra-anorectal pathology except some mild hemorrhoids that did not need therapy.  He did have a posterior midline chronic fissure and small abscess cavity.  DESCRIPTION OF PROCEDURE:  Under satisfactory general anesthesia, the patient was placed in the lithotomy position.  He was prepped and draped in standard fashion.  Careful digital examination showed a  normal feeling prostate and no anorectal masses.  There was induration in the posterior midline.  Anoscope then inserted and careful examination revealed the findings above.  With good exposure, the fissure area was probed carefully and it only went out caudally. I then put a TB syringe with peroxide in the depths and instilled peroxide and saw no opening.  Therefore, I did not think we had any fistula.  Following this, with good exposure, using the  cautery, a midline transverse band of scar tissue was opened, the length of the chronic abscess cavity and the granulation tissue identified the extent.  Again, probing did not reveal any further tracts.  The base was then cauterized and the wound infiltrated with Marcaine with epinephrine for postoperative analgesia.  I would have preferred to do a sphincterotomy at this time but he has had previous surgery and I am not sure what that entailed.  Therefore, I did not wish to do sphincterotomy with the possibility of making  him incontinent.  We will follow him as an outpatient.  There were no complications. DD:  12/09/00 TD:  12/09/00 Job: [id number]

## 2010-12-22 NOTE — Cardiovascular Report (Signed)
David [name], David [name] NO.:  [id number]   MEDICAL RECORD NO.:  [id number]          PATIENT TYPE:  OIB   LOCATION:  2899                         FACILITY:  [facility]   PHYSICIAN:  David [name], M.D.DATE OF BIRTH:  04-06-1933   DATE OF PROCEDURE:  01/04/2006  DATE OF DISCHARGE:                              CARDIAC CATHETERIZATION   PROCEDURES PERFORMED:  1.  Selective coronary angiography by Judkins technique.  2.  Retrograde left heart catheterization.  3.  Left ventricular angiography.  4.  Selective visualization of multiple saphenous vein grafts.  5.  Selective visualization of the left internal mammary artery graft.  6.  Percutaneous coronary stenting of the mid saphenous vein graft to right      coronary artery PDA  7.  Percutaneous coronary stenting of the proximal saphenous vein graft to      obtuse marginal branches  #1, #2, and intermediate ramus.  8.  Right percutaneous femoral AngioSeal closure.   COMPLICATIONS:  None.   ENTRY SITE:  Right femoral.   DYE USED:  Omnipaque.   CATHETERS USED:  6-French catheters for diagnostic and for percutaneous  intervention.   MEDICATIONS GIVEN:  Intravenous Integrilin, intravenous heparin, intravenous  fentanyl for sedation, and Plavix was given orally with intravenous Pepcid.  300 mg on the Plavix and 40 mg total of Pepcid were given.   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 Donia [name].  He saw me in my office on Jan 01, 2006, reporting recent episodes of exertional pain relieved by rest or with  nitroglycerin or both.  He had had 3 or 4 of these episodes in the last 1-2  weeks before seen me in the office on Jan 01, 2006.  An EKG in the office  showed a paced rhythm with AV sequential pacing.  I discussed the  possibility of hospitalization, which the patient preferred not to do, and  we set up the current cath on an outpatient basis today.  He  has no known  drug allergies.  Past medical history shows he underwent coronary bypass  grafting September 10, 1988, by Dr. Particia [name].  A left internal mammary  artery graft was placed to the LAD.  A saphenous vein graft was placed to  the intermediate ramus branch and to obtuse marginal branches 1 and #2, and  a second saphenous vein graft was placed to the posterior descending branch  of the RCA.   Today's cardiac cath procedure resulted in not only diagnostic procedure  being done, but percutaneous stenting of the 2 vein grafts from 1990.  The  patient tolerated this procedure very well.  There was anginal pain during  the procedure, but no significant pain beyond balloon inflations.  The  patient was transferred from the cath lab table to the holding area with  stable vital signs at the end of the procedure.  He also was found to have  favorable location of his right femoral artery sheath that will allow for  AngioSeal closure which  was accomplished without complications.   RESULTS:  The left main coronary artery was calcified proximally, as was the  proximal LAD.   The left main coronary artery contained no more than a 20% lesion.   The LAD was 100% occluded proximally.   Left circumflex coronary artery was 100% occluded proximally.  Atrial  circumflex branches were noted from the circumflex, and I did see a faint  visualization of the septal perforator branch from the LAD.   RCA was 100% occluded midway down the vessel.  Before acute marginal branch  #1, there was a long stenosis of 90%.  There was intracoronary collaterals  from proximal right to distal right but very faint.  There was also a well-  developed collateral from the acute marginal branch to the apical LAD and a  right-to-left collateral flow-type pattern.   The saphenous vein graft to PDA was widely patent except in the midportion  of the vessel near a surgical clip which was 99% stenosed and fairly focal;   no calcification there.  The distal runoff into the RCA was good, and there  was some very distal disease in this vessel but it was too far be amenable  to intervention.   Saphenous vein graft sequentially placed to obtuse marginal branches 1 and  2, and intermediate ramus was proximal throughout except for the proximal  portion of the vein graft which contains a hypodense concentric 75%  stenosis.  The remainder of the vessel looked normal.   The left internal mammary artery graft was widely patent throughout and  inserted into the LAD which then blood flow was directed proximally and down  a diagonal.  The LAD about 10 mm beyond the insertion site of the LIMA was  occluded, but as one would recall, there was collateral to the apical LAD by  way of RCA graft.   Left ventricular angiogram showed a low ejection fraction estimated at 30%.  There was vigorous anterolateral wall motion, apex was slightly dyskinetic,  inferior and basal segments were severely hypokinetic.  I did not see LV  thrombus.  I did not see mitral regurgitation.  Percutaneous intervention  was accomplished by way of the right femoral artery using 6-French sheath  and guide catheters and was successful.   The RCA graft was approached with a Transport planner that was  multipurpose in configuration with use with a Prowater wire and was directly  stented using a 3.0 x 16 mm Cypher stent.  This required post dilatation  with a 3.75 Quantum Maverick to make all portions of the stent well-deployed  against the vessel wall, and a 3.75 Quantum Maverick was inflated to 17  atmospheres of pressure; thereafter, the 99% stenosis was reduced to 0%  residual and TIMI III flow was maintained.   Percutaneous intervention on the saphenous vein graft that was sequential to  OM1, OM2 and intermediate ramus was approached with a left coronary bypass guide catheter by Cordis., the same Prowater wire used with the previous   lesion and was directly stented after a filter wire was strategically placed  in a straight portion of the obtuse marginal vein graft.  I then directly  stented this vessel with a 4.0 20 Liberte balloon and then dilated that  Liberte balloon to a peak inflation pressure to ensure an anticipated  diameter of 4.5 mm.  There was preservation of TIMI III flow in this vessel  and the 75% lesion was reduced to 0% residual.  No complications occurred.  The patient tolerated the procedure well.  AngioSeal closure was accomplished in the right femoral artery and then the  patient was transferred to the holding area.   FINAL DIAGNOSES:  1.  Stable angina for the last 2 weeks in this delightful gentleman.  2.  Status post coronary bypass grafting in 1990.  3.  Three-vessel coronary artery disease.      1.  100% proximal left anterior descending occlusion      2.  100% proximal circumflex occlusion.      3.  Mid right coronary artery occlusion.  4.  Patent internal mammary artery graft to left anterior descending with      mid-to-distal left anterior descending occlusion beyond the left      internal mammary artery, but there was retrograde collateral flow to the      apical left anterior descending by way of right coronary artery      collaterals.  5.  Successful direct percutaneous stenting of the stenosed saphenous vein      graft to distal posterior descending artery with reduction of a 99%      lesion to 0% residual.  6.  Successful stenting of the saphenous vein graft to obtuse marginal      branch 1, 2 and intermediate ramus.  7.  Left ventricular systolic function is moderately depressed, ejection      fraction 30%.   PLAN:  Patient will be discharged tomorrow after continuation of Integrilin  today and then Plavix loading will be continued.  The patient needs to be  considered for an ICD at some point down the road.           ______________________________  David [name],  M.D.     WHG/MEDQ  D:  01/04/2006  T:  01/04/2006  Job:  [id number]

## 2011-01-05 ENCOUNTER — Other Ambulatory Visit: Payer: Self-pay | Admitting: Oncology

## 2011-01-05 ENCOUNTER — Encounter (HOSPITAL_BASED_OUTPATIENT_CLINIC_OR_DEPARTMENT_OTHER): Payer: Medicare Other | Admitting: Oncology

## 2011-01-05 DIAGNOSIS — D696 Thrombocytopenia, unspecified: Secondary | ICD-10-CM

## 2011-01-05 DIAGNOSIS — D702 Other drug-induced agranulocytosis: Secondary | ICD-10-CM

## 2011-01-05 DIAGNOSIS — C8298 Follicular lymphoma, unspecified, lymph nodes of multiple sites: Secondary | ICD-10-CM

## 2011-01-05 DIAGNOSIS — R21 Rash and other nonspecific skin eruption: Secondary | ICD-10-CM

## 2011-01-05 DIAGNOSIS — D6959 Other secondary thrombocytopenia: Secondary | ICD-10-CM

## 2011-01-05 DIAGNOSIS — C8291 Follicular lymphoma, unspecified, lymph nodes of head, face, and neck: Secondary | ICD-10-CM

## 2011-01-05 LAB — CBC WITH DIFFERENTIAL/PLATELET
BASO%: 0.5 % (ref 0.0–2.0)
Basophils Absolute: 0 10*3/uL (ref 0.0–0.1)
EOS%: 4.6 % (ref 0.0–7.0)
HCT: 36.2 % — ABNORMAL LOW (ref 38.4–49.9)
HGB: 12.8 g/dL — ABNORMAL LOW (ref 13.0–17.1)
MCH: 32.6 pg (ref 27.2–33.4)
MONO#: 0.5 10*3/uL (ref 0.1–0.9)
NEUT#: 2.8 10*3/uL (ref 1.5–6.5)
NEUT%: 67.1 % (ref 39.0–75.0)
RDW: 13.2 % (ref 11.0–14.6)
WBC: 4.2 10*3/uL (ref 4.0–10.3)
lymph#: 0.7 10*3/uL — ABNORMAL LOW (ref 0.9–3.3)

## 2011-05-07 ENCOUNTER — Other Ambulatory Visit: Payer: Self-pay | Admitting: Oncology

## 2011-05-07 ENCOUNTER — Encounter (HOSPITAL_BASED_OUTPATIENT_CLINIC_OR_DEPARTMENT_OTHER): Payer: Medicare Other | Admitting: Oncology

## 2011-05-07 DIAGNOSIS — C8291 Follicular lymphoma, unspecified, lymph nodes of head, face, and neck: Secondary | ICD-10-CM

## 2011-05-07 DIAGNOSIS — C8401 Mycosis fungoides, lymph nodes of head, face, and neck: Secondary | ICD-10-CM

## 2011-05-07 LAB — CBC WITH DIFFERENTIAL/PLATELET
BASO%: 0.2 % (ref 0.0–2.0)
EOS%: 4.7 % (ref 0.0–7.0)
HCT: 36.3 % — ABNORMAL LOW (ref 38.4–49.9)
LYMPH%: 13.5 % — ABNORMAL LOW (ref 14.0–49.0)
MCH: 32.3 pg (ref 27.2–33.4)
MCHC: 35.1 g/dL (ref 32.0–36.0)
NEUT%: 69.5 % (ref 39.0–75.0)
Platelets: 143 10*3/uL (ref 140–400)

## 2011-09-08 ENCOUNTER — Telehealth: Payer: Self-pay | Admitting: Oncology

## 2011-09-08 NOTE — Telephone Encounter (Signed)
Called pt, left message 10/29/11, lab and MD

## 2011-10-17 DIAGNOSIS — E782 Mixed hyperlipidemia: Secondary | ICD-10-CM | POA: Diagnosis not present

## 2011-10-26 ENCOUNTER — Encounter: Payer: Self-pay | Admitting: *Deleted

## 2011-10-26 DIAGNOSIS — E782 Mixed hyperlipidemia: Secondary | ICD-10-CM | POA: Diagnosis not present

## 2011-10-26 DIAGNOSIS — I739 Peripheral vascular disease, unspecified: Secondary | ICD-10-CM | POA: Diagnosis not present

## 2011-10-26 DIAGNOSIS — I2510 Atherosclerotic heart disease of native coronary artery without angina pectoris: Secondary | ICD-10-CM | POA: Diagnosis not present

## 2011-10-29 ENCOUNTER — Ambulatory Visit (HOSPITAL_BASED_OUTPATIENT_CLINIC_OR_DEPARTMENT_OTHER): Payer: Medicare Other | Admitting: Oncology

## 2011-10-29 ENCOUNTER — Telehealth: Payer: Self-pay | Admitting: Oncology

## 2011-10-29 ENCOUNTER — Other Ambulatory Visit: Payer: Medicare Other | Admitting: Lab

## 2011-10-29 VITALS — BP 117/65 | HR 60 | Temp 96.8°F | Ht 70.0 in | Wt 206.3 lb

## 2011-10-29 DIAGNOSIS — C8589 Other specified types of non-Hodgkin lymphoma, extranodal and solid organ sites: Secondary | ICD-10-CM | POA: Diagnosis not present

## 2011-10-29 LAB — CBC WITH DIFFERENTIAL/PLATELET
Basophils Absolute: 0 10*3/uL (ref 0.0–0.1)
Eosinophils Absolute: 0.2 10*3/uL (ref 0.0–0.5)
HGB: 13.4 g/dL (ref 13.0–17.1)
LYMPH%: 14.5 % (ref 14.0–49.0)
MCV: 88.7 fL (ref 79.3–98.0)
MONO%: 13.3 % (ref 0.0–14.0)
NEUT#: 3 10*3/uL (ref 1.5–6.5)
Platelets: 123 10*3/uL — ABNORMAL LOW (ref 140–400)

## 2011-10-29 NOTE — Telephone Encounter (Signed)
gv pt appt for sept2013 

## 2011-10-29 NOTE — Progress Notes (Signed)
OFFICE PROGRESS NOTE   INTERVAL HISTORY:   He returns as scheduled. He feels well. No fever, night sweats, or anorexia. He appreciates slight fullness in the left submandibular area. Otherwise no palpable lymph nodes.  Objective:  Vital signs in last 24 hours:  Blood pressure 117/65, pulse 60, temperature 96.8 F (36 C), temperature source Oral, height 5\' 10"  (1.778 m), weight 206 lb 4.8 oz (93.577 kg).    HEENT: Oropharynx without visible mass, neck without mass, the submandibular glands appear symmetric Lymphatics: No cervical, supraclavicular, or inguinal nodes. Left greater than right axillary fat pad versus soft mobile 1-2 cm nodes Resp: Lungs clear bilaterally Cardio: Regular rate and rhythm GI: No hepatosplenomegaly Vascular: No leg edema  Lab Results:  Lab Results  Component Value Date   WBC 4.4 10/29/2011   HGB 13.4 10/29/2011   HCT 37.6* 10/29/2011   MCV 88.7 10/29/2011   PLT 123* 10/29/2011   ANC 3.0   Medications: I have reviewed the patient's current medications.  Assessment/Plan: 1. Non-Hodgkin's lymphoma, follicular B-cell, low grade, involving a left cervical lymph node with a staging evaluation revealing evidence for advanced stage disease.  There was cervical, mediastinal, and abdominal lymphadenopathy and a question of splenic involvement on staging scans.  He completed 4 cycles of fludarabine/rituximab with resolution of the palpable lymphadenopathy.  The last chemotherapy was given in June 2011.   2. Skin rash following cycle #1 of fludarabine/rituximab, likely related to allopurinol or Bactrim.  The Bactrim prophylaxis was discontinued. 3. Thrombocytopenia secondary chemotherapy and non-Hodgkin's lymphoma, improved. 4. History of neutropenia secondary to chemotherapy. 5. Injection site reaction to a pneumococcal vaccine in October 2011. 6. History of coronary artery disease, followed by Dr. Alanda Amass.    Disposition:  There is no clinical evidence for  disease progression. The plan is to continue an observation approach. He will return for an office visit in 6 months.   Lucile Shutters, MD  10/29/2011  12:26 PM

## 2011-10-31 DIAGNOSIS — M722 Plantar fascial fibromatosis: Secondary | ICD-10-CM | POA: Diagnosis not present

## 2011-11-30 DIAGNOSIS — M722 Plantar fascial fibromatosis: Secondary | ICD-10-CM | POA: Diagnosis not present

## 2011-12-18 DIAGNOSIS — M722 Plantar fascial fibromatosis: Secondary | ICD-10-CM | POA: Diagnosis not present

## 2012-01-24 DIAGNOSIS — H40059 Ocular hypertension, unspecified eye: Secondary | ICD-10-CM | POA: Diagnosis not present

## 2012-01-24 DIAGNOSIS — H259 Unspecified age-related cataract: Secondary | ICD-10-CM | POA: Diagnosis not present

## 2012-01-24 DIAGNOSIS — H3500 Unspecified background retinopathy: Secondary | ICD-10-CM | POA: Diagnosis not present

## 2012-01-29 DIAGNOSIS — M722 Plantar fascial fibromatosis: Secondary | ICD-10-CM | POA: Diagnosis not present

## 2012-02-21 DIAGNOSIS — J069 Acute upper respiratory infection, unspecified: Secondary | ICD-10-CM | POA: Diagnosis not present

## 2012-02-25 ENCOUNTER — Other Ambulatory Visit: Payer: Self-pay | Admitting: Family Medicine

## 2012-02-25 ENCOUNTER — Ambulatory Visit
Admission: RE | Admit: 2012-02-25 | Discharge: 2012-02-25 | Disposition: A | Discharge status: Home / Self Care | Payer: Medicare Other | Source: Ambulatory Visit | Attending: Family Medicine | Admitting: Family Medicine

## 2012-02-25 DIAGNOSIS — R509 Fever, unspecified: Secondary | ICD-10-CM | POA: Diagnosis not present

## 2012-02-25 DIAGNOSIS — R0602 Shortness of breath: Secondary | ICD-10-CM

## 2012-02-25 DIAGNOSIS — R05 Cough: Secondary | ICD-10-CM | POA: Diagnosis not present

## 2012-02-25 DIAGNOSIS — J209 Acute bronchitis, unspecified: Secondary | ICD-10-CM | POA: Diagnosis not present

## 2012-02-25 DIAGNOSIS — R4182 Altered mental status, unspecified: Secondary | ICD-10-CM | POA: Diagnosis not present

## 2012-02-25 DIAGNOSIS — H109 Unspecified conjunctivitis: Secondary | ICD-10-CM | POA: Diagnosis not present

## 2012-03-04 DIAGNOSIS — E871 Hypo-osmolality and hyponatremia: Secondary | ICD-10-CM | POA: Diagnosis not present

## 2012-04-24 DIAGNOSIS — Z23 Encounter for immunization: Secondary | ICD-10-CM | POA: Diagnosis not present

## 2012-05-01 ENCOUNTER — Other Ambulatory Visit (HOSPITAL_BASED_OUTPATIENT_CLINIC_OR_DEPARTMENT_OTHER): Payer: Medicare Other

## 2012-05-01 ENCOUNTER — Ambulatory Visit (HOSPITAL_BASED_OUTPATIENT_CLINIC_OR_DEPARTMENT_OTHER): Payer: Medicare Other | Admitting: Oncology

## 2012-05-01 VITALS — BP 126/70 | HR 62 | Temp 97.1°F | Resp 18 | Ht 70.0 in | Wt 203.4 lb

## 2012-05-01 DIAGNOSIS — R21 Rash and other nonspecific skin eruption: Secondary | ICD-10-CM | POA: Diagnosis not present

## 2012-05-01 DIAGNOSIS — D696 Thrombocytopenia, unspecified: Secondary | ICD-10-CM

## 2012-05-01 DIAGNOSIS — C8589 Other specified types of non-Hodgkin lymphoma, extranodal and solid organ sites: Secondary | ICD-10-CM

## 2012-05-01 DIAGNOSIS — D702 Other drug-induced agranulocytosis: Secondary | ICD-10-CM | POA: Diagnosis not present

## 2012-05-01 DIAGNOSIS — C8299 Follicular lymphoma, unspecified, extranodal and solid organ sites: Secondary | ICD-10-CM | POA: Diagnosis not present

## 2012-05-01 DIAGNOSIS — I70219 Atherosclerosis of native arteries of extremities with intermittent claudication, unspecified extremity: Secondary | ICD-10-CM | POA: Diagnosis not present

## 2012-05-01 DIAGNOSIS — E782 Mixed hyperlipidemia: Secondary | ICD-10-CM | POA: Diagnosis not present

## 2012-05-01 DIAGNOSIS — I2510 Atherosclerotic heart disease of native coronary artery without angina pectoris: Secondary | ICD-10-CM | POA: Diagnosis not present

## 2012-05-01 LAB — CBC WITH DIFFERENTIAL/PLATELET
Basophils Absolute: 0 10*3/uL (ref 0.0–0.1)
EOS%: 5.2 % (ref 0.0–7.0)
HGB: 13.1 g/dL (ref 13.0–17.1)
MCH: 31.5 pg (ref 27.2–33.4)
MCHC: 34.7 g/dL (ref 32.0–36.0)
MCV: 90.6 fL (ref 79.3–98.0)
MONO%: 13.4 % (ref 0.0–14.0)
RBC: 4.16 10*6/uL — ABNORMAL LOW (ref 4.20–5.82)
RDW: 13.8 % (ref 11.0–14.6)

## 2012-05-01 NOTE — Progress Notes (Signed)
   [facility]    OFFICE PROGRESS NOTE   INTERVAL HISTORY:   He returns as scheduled. He feels well. No fever, night sweats, or anorexia. No palpable lymph nodes. He has claudication in the left leg. This is managed by Dr. Alanda Amass.  Objective:  Vital signs in last 24 hours:  Blood pressure 126/70, pulse 62, temperature 97.1 F (36.2 C), temperature source Oral, resp. rate 18, height 5\' 10"  (1.778 m), weight 203 lb 6.4 oz (92.262 kg).    HEENT: Neck without mass Lymphatics: No cervical, supraclavicular, or inguinal nodes. Prominent axillary fat pads versus a soft mobile 1-2 cm nodes. Resp: Lungs clear bilaterally Cardio: Regular rate and rhythm GI: No hepatosplenomegaly Vascular: No leg edema  Portacath/PICC-without erythema  Lab Results:  Lab Results  Component Value Date   WBC 4.0 05/01/2012   HGB 13.1 05/01/2012   HCT 37.7* 05/01/2012   MCV 90.6 05/01/2012   PLT 124* 05/01/2012   ANC 2.5    Medications: I have reviewed the patient's current medications.  Assessment/Plan: 1. Non-Hodgkin's lymphoma, follicular B-cell, low grade, involving a left cervical lymph node with a staging evaluation revealing evidence for advanced stage disease. There was cervical, mediastinal, and abdominal lymphadenopathy and a question of splenic involvement on staging scans. He completed 4 cycles of fludarabine/rituximab with resolution of the palpable lymphadenopathy. The last chemotherapy was given in June 2011.  2. Skin rash following cycle #1 of fludarabine/rituximab, likely related to allopurinol or Bactrim. The Bactrim prophylaxis was discontinued. 3. Thrombocytopenia secondary chemotherapy and non-Hodgkin's lymphoma, improved. 4. History of neutropenia secondary to chemotherapy. 5. Injection site reaction to a pneumococcal vaccine in October 2011. 6. History of coronary artery disease and peripheral vascular disease, followed by Dr. Alanda Amass.   Disposition:  He  remains in clinical remission from the non-Hodgkin's lymphoma. He will return for an office visit in 8 months.   Thornton Papas, MD  05/01/2012  3:12 PM

## 2012-05-02 ENCOUNTER — Telehealth: Payer: Self-pay | Admitting: Oncology

## 2012-05-02 NOTE — Telephone Encounter (Signed)
lvm for pt regarding may appt....mailed may appt schedule.

## 2012-05-05 DIAGNOSIS — E782 Mixed hyperlipidemia: Secondary | ICD-10-CM | POA: Diagnosis not present

## 2012-05-14 DIAGNOSIS — I2510 Atherosclerotic heart disease of native coronary artery without angina pectoris: Secondary | ICD-10-CM | POA: Diagnosis not present

## 2012-05-14 DIAGNOSIS — I10 Essential (primary) hypertension: Secondary | ICD-10-CM | POA: Diagnosis not present

## 2012-05-14 DIAGNOSIS — I739 Peripheral vascular disease, unspecified: Secondary | ICD-10-CM | POA: Diagnosis not present

## 2012-05-16 DIAGNOSIS — I739 Peripheral vascular disease, unspecified: Secondary | ICD-10-CM | POA: Diagnosis not present

## 2012-05-16 DIAGNOSIS — I70219 Atherosclerosis of native arteries of extremities with intermittent claudication, unspecified extremity: Secondary | ICD-10-CM | POA: Diagnosis not present

## 2012-06-12 DIAGNOSIS — I6529 Occlusion and stenosis of unspecified carotid artery: Secondary | ICD-10-CM | POA: Diagnosis not present

## 2012-06-30 DIAGNOSIS — L739 Follicular disorder, unspecified: Secondary | ICD-10-CM | POA: Diagnosis not present

## 2012-07-15 DIAGNOSIS — M779 Enthesopathy, unspecified: Secondary | ICD-10-CM | POA: Diagnosis not present

## 2012-08-07 DIAGNOSIS — C8588 Other specified types of non-Hodgkin lymphoma, lymph nodes of multiple sites: Secondary | ICD-10-CM | POA: Diagnosis not present

## 2012-08-07 DIAGNOSIS — Z1211 Encounter for screening for malignant neoplasm of colon: Secondary | ICD-10-CM | POA: Diagnosis not present

## 2012-08-07 DIAGNOSIS — I10 Essential (primary) hypertension: Secondary | ICD-10-CM | POA: Diagnosis not present

## 2012-08-07 DIAGNOSIS — I739 Peripheral vascular disease, unspecified: Secondary | ICD-10-CM | POA: Diagnosis not present

## 2012-08-07 DIAGNOSIS — Z0000 Encounter for general adult medical examination without abnormal findings: Secondary | ICD-10-CM | POA: Diagnosis not present

## 2012-08-07 DIAGNOSIS — E782 Mixed hyperlipidemia: Secondary | ICD-10-CM | POA: Diagnosis not present

## 2012-08-07 DIAGNOSIS — I2510 Atherosclerotic heart disease of native coronary artery without angina pectoris: Secondary | ICD-10-CM | POA: Diagnosis not present

## 2012-08-19 DIAGNOSIS — Z1211 Encounter for screening for malignant neoplasm of colon: Secondary | ICD-10-CM | POA: Diagnosis not present

## 2012-10-21 ENCOUNTER — Other Ambulatory Visit (HOSPITAL_COMMUNITY): Payer: Self-pay | Admitting: Cardiovascular Disease

## 2012-10-21 DIAGNOSIS — I519 Heart disease, unspecified: Secondary | ICD-10-CM

## 2012-10-21 DIAGNOSIS — R011 Cardiac murmur, unspecified: Secondary | ICD-10-CM

## 2012-10-21 DIAGNOSIS — E782 Mixed hyperlipidemia: Secondary | ICD-10-CM | POA: Diagnosis not present

## 2012-10-21 DIAGNOSIS — I447 Left bundle-branch block, unspecified: Secondary | ICD-10-CM

## 2012-10-21 DIAGNOSIS — I7389 Other specified peripheral vascular diseases: Secondary | ICD-10-CM | POA: Diagnosis not present

## 2012-10-21 DIAGNOSIS — I2510 Atherosclerotic heart disease of native coronary artery without angina pectoris: Secondary | ICD-10-CM | POA: Diagnosis not present

## 2012-10-24 DIAGNOSIS — H259 Unspecified age-related cataract: Secondary | ICD-10-CM | POA: Diagnosis not present

## 2012-10-24 DIAGNOSIS — H40019 Open angle with borderline findings, low risk, unspecified eye: Secondary | ICD-10-CM | POA: Diagnosis not present

## 2012-10-24 DIAGNOSIS — H40059 Ocular hypertension, unspecified eye: Secondary | ICD-10-CM | POA: Diagnosis not present

## 2012-10-24 DIAGNOSIS — H43819 Vitreous degeneration, unspecified eye: Secondary | ICD-10-CM | POA: Diagnosis not present

## 2012-11-03 DIAGNOSIS — J309 Allergic rhinitis, unspecified: Secondary | ICD-10-CM | POA: Diagnosis not present

## 2012-11-03 DIAGNOSIS — IMO0002 Reserved for concepts with insufficient information to code with codable children: Secondary | ICD-10-CM | POA: Diagnosis not present

## 2012-11-04 ENCOUNTER — Ambulatory Visit (HOSPITAL_COMMUNITY)
Admission: RE | Admit: 2012-11-04 | Discharge: 2012-11-04 | Disposition: A | Discharge status: Home / Self Care | Payer: Medicare Other | Source: Ambulatory Visit | Attending: Cardiovascular Disease | Admitting: Cardiovascular Disease

## 2012-11-04 DIAGNOSIS — I519 Heart disease, unspecified: Secondary | ICD-10-CM

## 2012-11-04 DIAGNOSIS — R011 Cardiac murmur, unspecified: Secondary | ICD-10-CM | POA: Diagnosis not present

## 2012-11-04 DIAGNOSIS — I2510 Atherosclerotic heart disease of native coronary artery without angina pectoris: Secondary | ICD-10-CM | POA: Insufficient documentation

## 2012-11-04 DIAGNOSIS — I2789 Other specified pulmonary heart diseases: Secondary | ICD-10-CM | POA: Diagnosis not present

## 2012-11-04 DIAGNOSIS — I059 Rheumatic mitral valve disease, unspecified: Secondary | ICD-10-CM | POA: Diagnosis not present

## 2012-11-04 DIAGNOSIS — I10 Essential (primary) hypertension: Secondary | ICD-10-CM | POA: Diagnosis not present

## 2012-11-04 DIAGNOSIS — IMO0002 Reserved for concepts with insufficient information to code with codable children: Secondary | ICD-10-CM | POA: Diagnosis not present

## 2012-11-04 DIAGNOSIS — I079 Rheumatic tricuspid valve disease, unspecified: Secondary | ICD-10-CM | POA: Diagnosis not present

## 2012-11-04 DIAGNOSIS — E785 Hyperlipidemia, unspecified: Secondary | ICD-10-CM | POA: Insufficient documentation

## 2012-11-04 DIAGNOSIS — M549 Dorsalgia, unspecified: Secondary | ICD-10-CM | POA: Diagnosis not present

## 2012-11-04 DIAGNOSIS — I447 Left bundle-branch block, unspecified: Secondary | ICD-10-CM | POA: Diagnosis not present

## 2012-11-04 DIAGNOSIS — K5900 Constipation, unspecified: Secondary | ICD-10-CM | POA: Diagnosis not present

## 2012-11-04 NOTE — Progress Notes (Signed)
2D Echo Performed 11/04/2012    David [name], RCS  

## 2012-11-20 DIAGNOSIS — L0291 Cutaneous abscess, unspecified: Secondary | ICD-10-CM | POA: Diagnosis not present

## 2012-11-20 DIAGNOSIS — M76899 Other specified enthesopathies of unspecified lower limb, excluding foot: Secondary | ICD-10-CM | POA: Diagnosis not present

## 2012-11-20 DIAGNOSIS — M25569 Pain in unspecified knee: Secondary | ICD-10-CM | POA: Diagnosis not present

## 2012-12-03 DIAGNOSIS — M9420 Chondromalacia, unspecified site: Secondary | ICD-10-CM | POA: Diagnosis not present

## 2012-12-03 DIAGNOSIS — M25569 Pain in unspecified knee: Secondary | ICD-10-CM | POA: Diagnosis not present

## 2012-12-08 DIAGNOSIS — M779 Enthesopathy, unspecified: Secondary | ICD-10-CM | POA: Diagnosis not present

## 2012-12-30 ENCOUNTER — Telehealth: Payer: Self-pay | Admitting: Oncology

## 2012-12-30 ENCOUNTER — Other Ambulatory Visit (HOSPITAL_BASED_OUTPATIENT_CLINIC_OR_DEPARTMENT_OTHER): Payer: Medicare Other | Admitting: Lab

## 2012-12-30 ENCOUNTER — Ambulatory Visit (HOSPITAL_BASED_OUTPATIENT_CLINIC_OR_DEPARTMENT_OTHER): Payer: Medicare Other | Admitting: Oncology

## 2012-12-30 VITALS — BP 112/65 | HR 62 | Temp 97.0°F | Resp 18 | Ht 70.0 in | Wt 207.6 lb

## 2012-12-30 DIAGNOSIS — C8589 Other specified types of non-Hodgkin lymphoma, extranodal and solid organ sites: Secondary | ICD-10-CM | POA: Diagnosis not present

## 2012-12-30 LAB — CBC WITH DIFFERENTIAL/PLATELET
BASO%: 0.4 % (ref 0.0–2.0)
LYMPH%: 19.5 % (ref 14.0–49.0)
MCHC: 34.7 g/dL (ref 32.0–36.0)
MONO#: 0.6 10*3/uL (ref 0.1–0.9)
MONO%: 12.3 % (ref 0.0–14.0)
Platelets: 117 10*3/uL — ABNORMAL LOW (ref 140–400)
RBC: 4.24 10*6/uL (ref 4.20–5.82)
WBC: 4.6 10*3/uL (ref 4.0–10.3)
nRBC: 0 % (ref 0–0)

## 2012-12-30 NOTE — Progress Notes (Signed)
   [facility]    OFFICE PROGRESS NOTE   INTERVAL HISTORY:   He returns as scheduled. He feels well. A right axillary "cyst "was recently drained by Dr. Clelia Croft. He has a history of intermittent sebaceous cyst. Good appetite and energy level.  Objective:  Vital signs in last 24 hours:  Blood pressure 112/65, pulse 62, temperature 97 F (36.1 C), temperature source Oral, resp. rate 18, height 5\' 10"  (1.778 m), weight 207 lb 9.6 oz (94.167 kg).    HEENT: Neck without mass Lymphatics: No cervical, supraclavicular, or inguinal nodes. Prominent, left greater than right, axillary fat pad versus a soft mobile 1-2 cm nodes. Resp: Lungs clear bilaterally Cardio: Regular rate and rhythm GI: No hepatosplenomegaly Vascular: No leg edema   Lab Results:  Lab Results  Component Value Date   WBC 4.6 12/30/2012   HGB 13.3 12/30/2012   HCT 38.3* 12/30/2012   MCV 90.3 12/30/2012   PLT 117* 12/30/2012   ANC 2.8    Medications: I have reviewed the patient's current medications.  Assessment/Plan: 1. Non-Hodgkin's lymphoma, follicular B-cell, low grade, involving a left cervical lymph node with a staging evaluation revealing evidence for advanced stage disease. There was cervical, mediastinal, and abdominal lymphadenopathy and a question of splenic involvement on staging scans. He completed 4 cycles of fludarabine/rituximab with resolution of the palpable lymphadenopathy. The last chemotherapy was given in June 2011.  2. Skin rash following cycle #1 of fludarabine/rituximab, likely related to allopurinol or Bactrim. The Bactrim prophylaxis was discontinued. 3. Thrombocytopenia secondary chemotherapy and non-Hodgkin's lymphoma, stable. 4. History of neutropenia secondary to chemotherapy. 5. Injection site reaction to a pneumococcal vaccine in October 2011. 6. History of coronary artery disease and peripheral vascular disease, followed by Dr. Alanda Amass.       Disposition:  He  remains in clinical remission from non-Hodgkin's lymphoma. He will periodically check the axillary areas. He will contact us for enlarging lymph nodes. Mr. Tanzi will return for an office visit in 8 months.   Thornton Papas, MD  12/30/2012  1:33 PM

## 2012-12-30 NOTE — Telephone Encounter (Signed)
gv and printed appt sched and avs for pt for 1.2015

## 2013-01-09 ENCOUNTER — Telehealth: Payer: Self-pay | Admitting: Cardiovascular Disease

## 2013-01-09 NOTE — Telephone Encounter (Signed)
Please call new prescriptions in to [facility]-He needs his Crestor 40 mg-[facility] said they had faxed twice!

## 2013-01-09 NOTE — Telephone Encounter (Signed)
ERx crestor to [facility]

## 2013-02-05 ENCOUNTER — Other Ambulatory Visit: Payer: Self-pay | Admitting: *Deleted

## 2013-02-05 ENCOUNTER — Encounter: Payer: Self-pay | Admitting: Cardiovascular Disease

## 2013-02-05 DIAGNOSIS — I739 Peripheral vascular disease, unspecified: Secondary | ICD-10-CM

## 2013-02-05 DIAGNOSIS — R079 Chest pain, unspecified: Secondary | ICD-10-CM

## 2013-02-05 DIAGNOSIS — E782 Mixed hyperlipidemia: Secondary | ICD-10-CM | POA: Diagnosis not present

## 2013-02-05 DIAGNOSIS — I2510 Atherosclerotic heart disease of native coronary artery without angina pectoris: Secondary | ICD-10-CM | POA: Diagnosis not present

## 2013-02-10 ENCOUNTER — Ambulatory Visit (HOSPITAL_COMMUNITY)
Admission: RE | Admit: 2013-02-10 | Discharge: 2013-02-10 | Disposition: A | Discharge status: Home / Self Care | Payer: Medicare Other | Source: Ambulatory Visit | Attending: Cardiovascular Disease | Admitting: Cardiovascular Disease

## 2013-02-10 DIAGNOSIS — I739 Peripheral vascular disease, unspecified: Secondary | ICD-10-CM | POA: Diagnosis not present

## 2013-02-10 DIAGNOSIS — I6529 Occlusion and stenosis of unspecified carotid artery: Secondary | ICD-10-CM | POA: Diagnosis not present

## 2013-02-10 NOTE — Progress Notes (Signed)
Carotid Duplex Completed. Esco Joslyn, RDMS, RVT  

## 2013-02-13 ENCOUNTER — Encounter: Payer: Self-pay | Admitting: Cardiovascular Disease

## 2013-02-13 ENCOUNTER — Ambulatory Visit (HOSPITAL_COMMUNITY)
Admission: RE | Admit: 2013-02-13 | Discharge: 2013-02-13 | Disposition: A | Discharge status: Home / Self Care | Payer: Medicare Other | Source: Ambulatory Visit | Attending: Cardiovascular Disease | Admitting: Cardiovascular Disease

## 2013-02-13 DIAGNOSIS — R079 Chest pain, unspecified: Secondary | ICD-10-CM | POA: Diagnosis not present

## 2013-02-13 DIAGNOSIS — I252 Old myocardial infarction: Secondary | ICD-10-CM | POA: Insufficient documentation

## 2013-02-13 DIAGNOSIS — I509 Heart failure, unspecified: Secondary | ICD-10-CM | POA: Insufficient documentation

## 2013-02-13 DIAGNOSIS — R0602 Shortness of breath: Secondary | ICD-10-CM | POA: Insufficient documentation

## 2013-02-13 DIAGNOSIS — R0609 Other forms of dyspnea: Secondary | ICD-10-CM | POA: Diagnosis not present

## 2013-02-13 DIAGNOSIS — R0989 Other specified symptoms and signs involving the circulatory and respiratory systems: Secondary | ICD-10-CM | POA: Insufficient documentation

## 2013-02-13 DIAGNOSIS — I2510 Atherosclerotic heart disease of native coronary artery without angina pectoris: Secondary | ICD-10-CM

## 2013-02-13 MED ORDER — TECHNETIUM TC 99M SESTAMIBI GENERIC - CARDIOLITE
10.8000 | Freq: Once | INTRAVENOUS | Status: AC | PRN
  Administered 2013-02-13: 11 via INTRAVENOUS

## 2013-02-13 MED ORDER — REGADENOSON 0.4 MG/5ML IV SOLN
0.4000 mg | Freq: Once | INTRAVENOUS | Status: AC
  Administered 2013-02-13: 0.4 mg via INTRAVENOUS

## 2013-02-13 MED ORDER — TECHNETIUM TC 99M SESTAMIBI GENERIC - CARDIOLITE
30.1000 | Freq: Once | INTRAVENOUS | Status: AC | PRN
  Administered 2013-02-13: 30.1 via INTRAVENOUS

## 2013-02-13 NOTE — Procedures (Addendum)
[facility] [address] [place] [address] [phone]  Cardiology Nuclear Med Study  David [name] is [name] 79 y.o. male     MRN : [id number]     DOB: 02-05-1933  Procedure Date: 02/13/2013  Nuclear Med Background Indication for Stress Test:  Graft Patency, Stent Patency and PTCA Patency History:  CHF;CAD;MI-1990;CABG X3--1990;STENT/PTCA--2007;PACER X3 Cardiac Risk Factors: Family History - CAD, History of Smoking, Hypertension, NIDDM and PVD  Symptoms:  Chest Pain, DOE and SOB   Nuclear Pre-Procedure Caffeine/Decaff Intake:  1:00am NPO After: 11AM   IV Site: R Hand  IV 0.9% NS with Angio Cath:  22g  Chest Size (in):  42"  IV Started by: David Pomfret, RN  Height: 5\' 10"  (1.778 m)  Cup Size: [name]  BMI:  Body mass index is 29.7 kg/(m^2). Weight:  207 lb (93.895 kg)   Tech Comments:  [name]    Nuclear Med Study 1 or 2 day study: 1 day  Stress Test Type:  Lexiscan  Order Authorizing Provider:  Susa Griffins, MD   Resting Radionuclide: Technetium 52m Sestamibi  Resting Radionuclide Dose: 10.8 mCi   Stress Radionuclide:  Technetium 86m Sestamibi  Stress Radionuclide Dose: 30.1 mCi           Stress Protocol Rest HR: 60 Stress HR: 68  Rest BP: 158/92 Stress BP: 172/79  Exercise Time (min): [name] METS: [name]   Predicted Max HR: 142 bpm % Max HR: 47.89 bpm Rate Pressure Product: 72536  Dose of Adenosine (mg):  [name] Dose of Lexiscan: 0.4 mg  Dose of Atropine (mg): [name] Dose of Dobutamine: [name] mcg/kg/min (at max HR)  Stress Test Technologist: Esperanza Sheets, CCT Nuclear Technologist: Gonzella Lex, CNMT   Rest Procedure:  Myocardial perfusion imaging was performed at rest 45 minutes following the intravenous administration of Technetium 61m Sestamibi. Stress Procedure:  The patient received IV Lexiscan 0.4 mg over 15-seconds.  Technetium 68m Sestamibi injected at 30-seconds.  There were no significant changes with Lexiscan.   Quantitative spect images were obtained after [name] 45 minute delay.  Transient Ischemic Dilatation (Normal <1.22):  0.93 Lung/Heart Ratio (Normal <0.45):  0.36 QGS EDV:  105 ml QGS ESV:  54 ml LV Ejection Fraction: 48%  Signed by       Rest ECG: AV paced rhythm  Stress ECG: No significant change from baseline ECG  QPS Raw Data Images:  Normal; no motion artifact; normal heart/lung ratio. Stress Images:  Large apical defect, moderate inferior to inferolateral defect from apex to base and mild distal anterolateral defect Rest Images:  Similar apical  And mid-distal inferior defect with improved perfusion in th upper mid to basal inferior wall and distal aneterolateral wall. Subtraction (SDS):  Moderate apical  and inferior scar with ischemia in the upper mid to basal inferior/inferolateral walls as well as apical anterolaterrally.  Impression Exercise Capacity:  Lexiscan with no exercise. BP Response:  Normal blood pressure response. Clinical Symptoms:  Mild shortness of breath ECG Impression:  Nondiagnostic ECG due to paced rhythm Comparison with Prior Nuclear Study: Similar apical scar, but Inferior scar is less and ischemia is now present in the distal anterolateral apical region and the upper mid to basal inferior/inferolateral wall.  Overall Impression:  High risk stress nuclear study demonstrated old extensive scar (similar extent 27%) with additional ischemia in multiple vascular distributions in areas that previously were felt to be scar.  LV Wall Motion:  Mild LV dysfunction with  apical hypo-akinesis, EF 48%   David Mungin A, MD  02/13/2013 6:59 PM

## 2013-02-23 ENCOUNTER — Telehealth: Payer: Self-pay | Admitting: Cardiovascular Disease

## 2013-02-23 NOTE — Telephone Encounter (Signed)
David [name] is returning your call from this am.

## 2013-02-25 ENCOUNTER — Encounter (HOSPITAL_COMMUNITY): Payer: Self-pay | Admitting: Pharmacist

## 2013-02-25 ENCOUNTER — Encounter: Payer: Self-pay | Admitting: Cardiovascular Disease

## 2013-02-26 ENCOUNTER — Telehealth: Payer: Self-pay | Admitting: Cardiovascular Disease

## 2013-02-26 ENCOUNTER — Other Ambulatory Visit: Payer: Self-pay | Admitting: Cardiovascular Disease

## 2013-02-26 ENCOUNTER — Other Ambulatory Visit: Payer: Self-pay | Admitting: *Deleted

## 2013-02-26 DIAGNOSIS — R5383 Other fatigue: Secondary | ICD-10-CM | POA: Diagnosis not present

## 2013-02-26 DIAGNOSIS — Z01812 Encounter for preprocedural laboratory examination: Secondary | ICD-10-CM

## 2013-02-26 DIAGNOSIS — R6889 Other general symptoms and signs: Secondary | ICD-10-CM | POA: Diagnosis not present

## 2013-02-26 DIAGNOSIS — R5381 Other malaise: Secondary | ICD-10-CM | POA: Diagnosis not present

## 2013-02-26 DIAGNOSIS — Z01818 Encounter for other preprocedural examination: Secondary | ICD-10-CM | POA: Diagnosis not present

## 2013-02-26 LAB — COMPREHENSIVE METABOLIC PANEL
AST: 21 U/L (ref 0–37)
Alkaline Phosphatase: 47 U/L (ref 39–117)
BUN: 18 mg/dL (ref 6–23)
Creat: 1.25 mg/dL (ref 0.50–1.35)

## 2013-02-26 NOTE — Telephone Encounter (Signed)
Need lab order

## 2013-02-27 ENCOUNTER — Ambulatory Visit
Admission: RE | Admit: 2013-02-27 | Discharge: 2013-02-27 | Disposition: A | Discharge status: Home / Self Care | Payer: Medicare Other | Source: Ambulatory Visit | Attending: Cardiovascular Disease | Admitting: Cardiovascular Disease

## 2013-02-27 ENCOUNTER — Telehealth: Payer: Self-pay | Admitting: Cardiovascular Disease

## 2013-02-27 DIAGNOSIS — R079 Chest pain, unspecified: Secondary | ICD-10-CM

## 2013-02-27 DIAGNOSIS — Z01810 Encounter for preprocedural cardiovascular examination: Secondary | ICD-10-CM

## 2013-02-27 DIAGNOSIS — R0602 Shortness of breath: Secondary | ICD-10-CM | POA: Diagnosis not present

## 2013-02-27 DIAGNOSIS — Z01818 Encounter for other preprocedural examination: Secondary | ICD-10-CM | POA: Diagnosis not present

## 2013-02-27 LAB — URINALYSIS, ROUTINE W REFLEX MICROSCOPIC
Bilirubin Urine: NEGATIVE
Glucose, UA: NEGATIVE mg/dL
Leukocytes, UA: NEGATIVE
Specific Gravity, Urine: 1.018 (ref 1.005–1.030)
Urobilinogen, UA: 0.2 mg/dL (ref 0.0–1.0)

## 2013-02-27 LAB — CBC WITH DIFFERENTIAL/PLATELET
Basophils Absolute: 0 10*3/uL (ref 0.0–0.1)
Basophils Relative: 1 % (ref 0–1)
Hemoglobin: 12.6 g/dL — ABNORMAL LOW (ref 13.0–17.0)
MCHC: 34 g/dL (ref 30.0–36.0)
Neutro Abs: 3.3 10*3/uL (ref 1.7–7.7)
Neutrophils Relative %: 65 % (ref 43–77)
Platelets: 136 10*3/uL — ABNORMAL LOW (ref 150–400)
RDW: 14 % (ref 11.5–15.5)

## 2013-02-27 LAB — PROTIME-INR
INR: 0.99 (ref <1.50)
Prothrombin Time: 13.1 seconds (ref 11.6–15.2)

## 2013-02-27 NOTE — Telephone Encounter (Signed)
Does not have an order and the patient is there waiting .Marland Kitchen Thanks

## 2013-02-27 NOTE — Telephone Encounter (Signed)
Placed ordered and notified [place] inaging

## 2013-03-04 ENCOUNTER — Ambulatory Visit (HOSPITAL_COMMUNITY)
Admission: RE | Admit: 2013-03-04 | Discharge: 2013-03-05 | Disposition: A | Discharge status: Home / Self Care | Payer: Medicare Other | Source: Ambulatory Visit | Attending: Cardiovascular Disease | Admitting: Cardiovascular Disease

## 2013-03-04 ENCOUNTER — Encounter (HOSPITAL_COMMUNITY): Payer: Self-pay | Admitting: General Practice

## 2013-03-04 ENCOUNTER — Ambulatory Visit (HOSPITAL_COMMUNITY): Payer: Medicare Other

## 2013-03-04 ENCOUNTER — Encounter (HOSPITAL_COMMUNITY)
Admission: RE | Disposition: A | Discharge status: Home / Self Care | Payer: Self-pay | Source: Ambulatory Visit | Attending: Cardiovascular Disease

## 2013-03-04 DIAGNOSIS — C8589 Other specified types of non-Hodgkin lymphoma, extranodal and solid organ sites: Secondary | ICD-10-CM | POA: Insufficient documentation

## 2013-03-04 DIAGNOSIS — R9439 Abnormal result of other cardiovascular function study: Secondary | ICD-10-CM | POA: Diagnosis not present

## 2013-03-04 DIAGNOSIS — Z95820 Peripheral vascular angioplasty status with implants and grafts: Secondary | ICD-10-CM

## 2013-03-04 DIAGNOSIS — I200 Unstable angina: Secondary | ICD-10-CM | POA: Diagnosis not present

## 2013-03-04 DIAGNOSIS — Z955 Presence of coronary angioplasty implant and graft: Secondary | ICD-10-CM

## 2013-03-04 DIAGNOSIS — I25810 Atherosclerosis of coronary artery bypass graft(s) without angina pectoris: Secondary | ICD-10-CM | POA: Diagnosis not present

## 2013-03-04 DIAGNOSIS — E785 Hyperlipidemia, unspecified: Secondary | ICD-10-CM | POA: Diagnosis present

## 2013-03-04 DIAGNOSIS — Z950 Presence of cardiac pacemaker: Secondary | ICD-10-CM | POA: Insufficient documentation

## 2013-03-04 DIAGNOSIS — R931 Abnormal findings on diagnostic imaging of heart and coronary circulation: Secondary | ICD-10-CM | POA: Diagnosis present

## 2013-03-04 DIAGNOSIS — I2589 Other forms of chronic ischemic heart disease: Secondary | ICD-10-CM | POA: Diagnosis not present

## 2013-03-04 DIAGNOSIS — C8590 Non-Hodgkin lymphoma, unspecified, unspecified site: Secondary | ICD-10-CM | POA: Diagnosis present

## 2013-03-04 DIAGNOSIS — I442 Atrioventricular block, complete: Secondary | ICD-10-CM | POA: Diagnosis present

## 2013-03-04 DIAGNOSIS — I2510 Atherosclerotic heart disease of native coronary artery without angina pectoris: Secondary | ICD-10-CM | POA: Diagnosis not present

## 2013-03-04 DIAGNOSIS — Z79899 Other long term (current) drug therapy: Secondary | ICD-10-CM | POA: Insufficient documentation

## 2013-03-04 DIAGNOSIS — I739 Peripheral vascular disease, unspecified: Secondary | ICD-10-CM | POA: Diagnosis present

## 2013-03-04 DIAGNOSIS — I209 Angina pectoris, unspecified: Secondary | ICD-10-CM | POA: Diagnosis present

## 2013-03-04 DIAGNOSIS — Z01812 Encounter for preprocedural laboratory examination: Secondary | ICD-10-CM

## 2013-03-04 DIAGNOSIS — I10 Essential (primary) hypertension: Secondary | ICD-10-CM | POA: Diagnosis not present

## 2013-03-04 DIAGNOSIS — I255 Ischemic cardiomyopathy: Secondary | ICD-10-CM | POA: Diagnosis present

## 2013-03-04 HISTORY — PX: PERCUTANEOUS CORONARY STENT INTERVENTION (PCI-S): SHX5485

## 2013-03-04 HISTORY — DX: Atrioventricular block, complete: I44.2

## 2013-03-04 HISTORY — DX: Non-Hodgkin lymphoma, unspecified, unspecified site: C85.90

## 2013-03-04 HISTORY — PX: LEFT HEART CATHETERIZATION WITH CORONARY/GRAFT ANGIOGRAM: SHX5450

## 2013-03-04 HISTORY — DX: Presence of cardiac pacemaker: Z95.0

## 2013-03-04 HISTORY — DX: Unspecified osteoarthritis, unspecified site: M19.90

## 2013-03-04 SURGERY — LEFT HEART CATHETERIZATION WITH CORONARY/GRAFT ANGIOGRAM
Anesthesia: LOCAL

## 2013-03-04 MED ORDER — SODIUM CHLORIDE 0.9 % IV SOLN
INTRAVENOUS | Status: DC
  Administered 2013-03-04: 10:00:00 via INTRAVENOUS

## 2013-03-04 MED ORDER — ASPIRIN 81 MG PO CHEW
324.0000 mg | CHEWABLE_TABLET | ORAL | Status: AC
  Administered 2013-03-04: 324 mg via ORAL
  Filled 2013-03-04: qty 4

## 2013-03-04 MED ORDER — DIAZEPAM 5 MG PO TABS
5.0000 mg | ORAL_TABLET | ORAL | Status: AC
  Administered 2013-03-04: 5 mg via ORAL
  Filled 2013-03-04: qty 1

## 2013-03-04 MED ORDER — CLOPIDOGREL BISULFATE 75 MG PO TABS: 75.0000 mg | ORAL_TABLET | Freq: Every day | ORAL | Status: DC

## 2013-03-04 MED ORDER — CARVEDILOL 6.25 MG PO TABS
6.2500 mg | ORAL_TABLET | Freq: Two times a day (BID) | ORAL | Status: DC
  Filled 2013-03-04 (×2): qty 1

## 2013-03-04 MED ORDER — FAMOTIDINE 20 MG PO TABS
20.0000 mg | ORAL_TABLET | Freq: Every day | ORAL | Status: DC
  Administered 2013-03-04 – 2013-03-05 (×2): 20 mg via ORAL
  Filled 2013-03-04 (×2): qty 1

## 2013-03-04 MED ORDER — MIDAZOLAM HCL 2 MG/2ML IJ SOLN
INTRAMUSCULAR | Status: AC
  Filled 2013-03-04: qty 2

## 2013-03-04 MED ORDER — ACETAMINOPHEN 325 MG PO TABS: 650.0000 mg | ORAL_TABLET | ORAL | Status: DC | PRN

## 2013-03-04 MED ORDER — NITROGLYCERIN 0.2 MG/ML ON CALL CATH LAB
INTRAVENOUS | Status: AC
  Filled 2013-03-04: qty 1

## 2013-03-04 MED ORDER — BIMATOPROST 0.01 % OP SOLN
1.0000 [drp] | Freq: Every day | OPHTHALMIC | Status: DC
  Administered 2013-03-04: 1 [drp] via OPHTHALMIC
  Filled 2013-03-04: qty 2.5

## 2013-03-04 MED ORDER — LORATADINE 10 MG PO TABS
10.0000 mg | ORAL_TABLET | Freq: Every day | ORAL | Status: DC | PRN
  Administered 2013-03-04: 21:00:00 10 mg via ORAL
  Filled 2013-03-04: qty 1

## 2013-03-04 MED ORDER — LISINOPRIL 20 MG PO TABS
20.0000 mg | ORAL_TABLET | Freq: Every day | ORAL | Status: DC
  Administered 2013-03-05: 10:00:00 20 mg via ORAL
  Filled 2013-03-04: qty 1

## 2013-03-04 MED ORDER — FENTANYL CITRATE 0.05 MG/ML IJ SOLN
INTRAMUSCULAR | Status: AC
  Filled 2013-03-04: qty 2

## 2013-03-04 MED ORDER — ISOSORBIDE MONONITRATE ER 30 MG PO TB24
30.0000 mg | ORAL_TABLET | Freq: Every day | ORAL | Status: DC
  Administered 2013-03-05: 10:00:00 30 mg via ORAL
  Filled 2013-03-04 (×3): qty 1

## 2013-03-04 MED ORDER — CARVEDILOL 12.5 MG PO TABS
12.5000 mg | ORAL_TABLET | Freq: Two times a day (BID) | ORAL | Status: DC
  Administered 2013-03-04 – 2013-03-05 (×2): 12.5 mg via ORAL
  Filled 2013-03-04 (×3): qty 1

## 2013-03-04 MED ORDER — SODIUM CHLORIDE 0.9 % IV SOLN
INTRAVENOUS | Status: DC
  Administered 2013-03-04: 15:00:00 via INTRAVENOUS

## 2013-03-04 MED ORDER — SODIUM CHLORIDE 0.9 % IV SOLN
0.2500 mg/kg/h | INTRAVENOUS | Status: AC
  Filled 2013-03-04: qty 250

## 2013-03-04 MED ORDER — ASPIRIN EC 81 MG PO TBEC
81.0000 mg | DELAYED_RELEASE_TABLET | Freq: Every day | ORAL | Status: DC
  Filled 2013-03-04 (×2): qty 1

## 2013-03-04 MED ORDER — CILOSTAZOL 100 MG PO TABS
100.0000 mg | ORAL_TABLET | Freq: Two times a day (BID) | ORAL | Status: DC
  Administered 2013-03-04 – 2013-03-05 (×2): 100 mg via ORAL
  Filled 2013-03-04 (×5): qty 1

## 2013-03-04 MED ORDER — OMEGA-3-ACID ETHYL ESTERS 1 G PO CAPS
2.0000 g | ORAL_CAPSULE | Freq: Two times a day (BID) | ORAL | Status: DC
  Administered 2013-03-04 – 2013-03-05 (×2): 2 g via ORAL
  Filled 2013-03-04 (×3): qty 2

## 2013-03-04 MED ORDER — RANOLAZINE ER 500 MG PO TB12
500.0000 mg | ORAL_TABLET | Freq: Two times a day (BID) | ORAL | Status: DC
  Administered 2013-03-04 – 2013-03-05 (×2): 500 mg via ORAL
  Filled 2013-03-04 (×3): qty 1

## 2013-03-04 MED ORDER — LIDOCAINE HCL (PF) 1 % IJ SOLN
INTRAMUSCULAR | Status: AC
  Filled 2013-03-04: qty 30

## 2013-03-04 MED ORDER — CLOPIDOGREL BISULFATE 300 MG PO TABS
ORAL_TABLET | ORAL | Status: AC
  Filled 2013-03-04: qty 1

## 2013-03-04 MED ORDER — ONDANSETRON HCL 4 MG/2ML IJ SOLN: 4.0000 mg | Freq: Four times a day (QID) | INTRAMUSCULAR | Status: DC | PRN

## 2013-03-04 MED ORDER — SODIUM CHLORIDE 0.9 % IJ SOLN: 3.0000 mL | INTRAMUSCULAR | Status: DC | PRN

## 2013-03-04 MED ORDER — BIVALIRUDIN 250 MG IV SOLR
INTRAVENOUS | Status: AC
  Filled 2013-03-04: qty 250

## 2013-03-04 MED ORDER — CLOPIDOGREL BISULFATE 75 MG PO TABS
75.0000 mg | ORAL_TABLET | Freq: Every day | ORAL | Status: DC
  Administered 2013-03-05: 75 mg via ORAL
  Filled 2013-03-04: qty 1

## 2013-03-04 MED ORDER — HEPARIN (PORCINE) IN NACL 2-0.9 UNIT/ML-% IJ SOLN
INTRAMUSCULAR | Status: AC
  Filled 2013-03-04: qty 1000

## 2013-03-04 NOTE — Interval H&P Note (Signed)
Cath Lab Visit (complete for each Cath Lab visit)  Clinical Evaluation Leading to the Procedure:   ACS: no  Non-ACS:    Anginal Classification: CCS III  Anti-ischemic medical therapy: Minimal Therapy (1 class of medications)  Non-Invasive Test Results: High-risk stress test findings: cardiac mortality >3%/year  Prior CABG: Previous CABG      History and Physical Interval Note:  03/04/2013 10:31 AM  David [name]  has presented today for surgery, with the diagnosis of cad/cabg  The various methods of treatment have been discussed with the patient and family. After consideration of risks, benefits and other options for treatment, the patient has consented to  Procedure(s): LEFT HEART CATHETERIZATION WITH CORONARY/GRAFT ANGIOGRAM ([name]) as [name] surgical intervention .  The patient's history has been reviewed, patient examined, no change in status, stable for surgery.  I have reviewed the patient's chart and labs.  Questions were answered to the patient's satisfaction.     David [name]

## 2013-03-04 NOTE — H&P (Signed)
Updated H&P:  See Dr Kandis Cocking complete dictated note from 02/05/13. Pt seen and examined today. No change in PEx. He has class 3 anginal symptoms on medical thrapy, 24 yrs s/p CABG and remote PCI to SVG in past. Labs reviewed. Discussed cath/?PCI  with pt and family who wish to proceed as scheduled today.

## 2013-03-04 NOTE — Progress Notes (Signed)
Site area: right groin  Site Prior to Removal:  Level 0  Pressure Applied For 25 MINUTES    Minutes Beginning at 1710  Manual:   yes  Patient Status During Pull:  AA O X 4  Post Pull Groin Site:  Level 0  Post Pull Instructions Given:  yes  Post Pull Pulses Present:  yes  Dressing Applied:  yes  Comments:  Tolerated procedure well

## 2013-03-05 DIAGNOSIS — I255 Ischemic cardiomyopathy: Secondary | ICD-10-CM | POA: Diagnosis present

## 2013-03-05 DIAGNOSIS — I2510 Atherosclerotic heart disease of native coronary artery without angina pectoris: Secondary | ICD-10-CM | POA: Diagnosis not present

## 2013-03-05 DIAGNOSIS — R9439 Abnormal result of other cardiovascular function study: Secondary | ICD-10-CM | POA: Diagnosis not present

## 2013-03-05 DIAGNOSIS — I25810 Atherosclerosis of coronary artery bypass graft(s) without angina pectoris: Secondary | ICD-10-CM | POA: Diagnosis not present

## 2013-03-05 DIAGNOSIS — I442 Atrioventricular block, complete: Secondary | ICD-10-CM | POA: Diagnosis present

## 2013-03-05 DIAGNOSIS — Z95820 Peripheral vascular angioplasty status with implants and grafts: Secondary | ICD-10-CM

## 2013-03-05 DIAGNOSIS — I200 Unstable angina: Secondary | ICD-10-CM | POA: Diagnosis not present

## 2013-03-05 DIAGNOSIS — I209 Angina pectoris, unspecified: Secondary | ICD-10-CM | POA: Diagnosis present

## 2013-03-05 DIAGNOSIS — E785 Hyperlipidemia, unspecified: Secondary | ICD-10-CM | POA: Diagnosis present

## 2013-03-05 DIAGNOSIS — C8590 Non-Hodgkin lymphoma, unspecified, unspecified site: Secondary | ICD-10-CM | POA: Diagnosis present

## 2013-03-05 DIAGNOSIS — I739 Peripheral vascular disease, unspecified: Secondary | ICD-10-CM | POA: Diagnosis present

## 2013-03-05 DIAGNOSIS — R931 Abnormal findings on diagnostic imaging of heart and coronary circulation: Secondary | ICD-10-CM | POA: Diagnosis present

## 2013-03-05 DIAGNOSIS — I10 Essential (primary) hypertension: Secondary | ICD-10-CM | POA: Diagnosis present

## 2013-03-05 LAB — BASIC METABOLIC PANEL
CO2: 23 mEq/L (ref 19–32)
Chloride: 108 mEq/L (ref 96–112)
Glucose, Bld: 109 mg/dL — ABNORMAL HIGH (ref 70–99)
Potassium: 4.1 mEq/L (ref 3.5–5.1)
Sodium: 140 mEq/L (ref 135–145)

## 2013-03-05 LAB — CBC
Hemoglobin: 11.9 g/dL — ABNORMAL LOW (ref 13.0–17.0)
MCH: 32.3 pg (ref 26.0–34.0)
RBC: 3.68 MIL/uL — ABNORMAL LOW (ref 4.22–5.81)

## 2013-03-05 MED ORDER — ACETAMINOPHEN 325 MG PO TABS: 650.0000 mg | ORAL_TABLET | ORAL | Status: DC | PRN

## 2013-03-05 MED ORDER — RANOLAZINE ER 500 MG PO TB12: 500.0000 mg | ORAL_TABLET | Freq: Two times a day (BID) | ORAL | Status: DC

## 2013-03-05 MED ORDER — NITROGLYCERIN 0.4 MG SL SUBL: 0.4000 mg | SUBLINGUAL_TABLET | SUBLINGUAL | Status: DC | PRN

## 2013-03-05 MED ORDER — ROSUVASTATIN CALCIUM 40 MG PO TABS: 40.0000 mg | ORAL_TABLET | Freq: Every day | ORAL | Status: DC

## 2013-03-05 MED ORDER — ISOSORBIDE MONONITRATE ER 30 MG PO TB24: 30.0000 mg | ORAL_TABLET | Freq: Every day | ORAL | Status: DC

## 2013-03-05 MED FILL — Sodium Chloride IV Soln 0.9%: INTRAVENOUS | Qty: 50 | Status: AC

## 2013-03-05 NOTE — Progress Notes (Signed)
CARDIAC REHAB PHASE I   PRE:  Rate/Rhythm: 61 pacing    BP: sitting 150/60    SaO2:   MODE:  Ambulation: 500 ft   POST:  Rate/Rhythm: 74 SR    BP: sitting 172/70     SaO2:   Slow walk due to tendinitis/fascitis. No angina or claudication sx. Ed completed encouraging CRPII so to build up ex tol and hopefully not have foot pain on machines. Pt agreeable and requests his name be sent to [facility] CRPII.  [id number]   Harriet Masson CES, ACSM 03/05/2013 8:32 AM

## 2013-03-05 NOTE — Discharge Summary (Signed)
Patient ID: David [name],  MRN: [id number], DOB/AGE: 10/05/1932 79 y.o.  Admit date: 03/04/2013 Discharge date: 03/05/2013  Primary Care Provider:  Primary Cardiologist: Dr Alanda Amass  Discharge Diagnoses Principal Problem:   Unstable angina Active Problems:   S/P angioplasty with stent SVG-OM 7/30./14   Abnormal nuclear cardiac imaging test- high risk Myoview 02/13/13   CAD, CABG '95. S-PD/PL and S-OM PCI in '07.  Low risk Myoview 10/13   Cardiomyopathy, ischemic- EF 40-45% by echo 4/14   Complete heart block- PTVDP '95 with Gen change '02 and '09 (BS). Pacer dependednt   PVD - Lt popliteal occlusion with collaterals '09   Non Hodgkin's lymphoma- Feb 2011- radiation Rx   Dyslipidemia   HTN (hypertension)    Procedures: Cath/ SVG-OM PCI, DES   Hospital Course:  79 y/o male followed by Dr Alanda Amass with a history of remote CABG in 1995. He had an SVG-OM1 OM2 and SVG-PD/PL PCI in 2007. Myoview Aug 2013 was low risk. Recently he had an episode of angina at church. He saw Dr Alanda Amass and a Myoview was done 02/13/13 which was read as high risk. He was admitted 03/04/13 for diagnostic cath. This was done by Dr Tresa Endo. This revealed a patent LIMA-LAD, patent SVG-PD/PL, and 80% ISR in the SVG-OM with a 90% just distal to the stent. This was treated with HSRA and placement of an overlapping DES. The pt tolerated the procedure well. Dr Tresa Endo added Imdur and Ranexa. I spoke with Dr Tresa Endo by phone the morning of the 31st and he feels the pt can be discharged. He will follow up with Dr Alanda Amass in 1-2 weeks. He has a small amount of ecchymosis at his Rt groin but no hematoma.  Discharge Vitals:  Blood pressure 127/53, pulse 60, temperature 97.7 F (36.5 C), temperature source Oral, resp. rate 20, height 5' 9.5" (1.765 m), weight 94.1 kg (207 lb 7.3 oz), SpO2 95.00%.    Labs: Results for orders placed during the hospital encounter of 03/04/13 (from the past 48 hour(s))  POCT ACTIVATED CLOTTING TIME      Status: None   Collection Time    03/04/13 11:33 AM      Result Value Range   Activated Clotting Time 385    CBC     Status: Abnormal   Collection Time    03/05/13  4:40 AM      Result Value Range   WBC 4.6  4.0 - 10.5 K/uL   RBC 3.68 (*) 4.22 - 5.81 MIL/uL   Hemoglobin 11.9 (*) 13.0 - 17.0 g/dL   HCT 30.8 (*) 65.7 - 84.6 %   MCV 90.8  78.0 - 100.0 fL   MCH 32.3  26.0 - 34.0 pg   MCHC 35.6  30.0 - 36.0 g/dL   RDW 96.2  95.2 - 84.1 %   Platelets 101 (*) 150 - 400 K/uL   Comment: PLATELET COUNT CONFIRMED BY SMEAR  BASIC METABOLIC PANEL     Status: Abnormal   Collection Time    03/05/13  4:40 AM      Result Value Range   Sodium 140  135 - 145 mEq/L   Potassium 4.1  3.5 - 5.1 mEq/L   Chloride 108  96 - 112 mEq/L   CO2 23  19 - 32 mEq/L   Glucose, Bld 109 (*) 70 - 99 mg/dL   BUN 13  6 - 23 mg/dL   Creatinine, Ser 3.24  0.50 - 1.35 mg/dL   Calcium 8.4  8.4 - 10.5 mg/dL   GFR calc non Af Amer 69 (*) >90 mL/min   GFR calc Af Amer 80 (*) >90 mL/min   Comment:            The eGFR has been calculated     using the CKD EPI equation.     This calculation has not been     validated in all clinical     situations.     eGFR's persistently     <90 mL/min signify     possible Chronic Kidney Disease.    Disposition:  Follow-up Information   Follow up with Governor Rooks, MD. (office will call you)    Contact information:   [address] [place] [address] [phone]       Discharge Medications:    Medication List         acetaminophen 325 MG tablet  Commonly known as:  TYLENOL  Take 2 tablets (650 mg total) by mouth every 4 (four) hours as needed for pain or fever.     aspirin EC 81 MG tablet  Take 81 mg by mouth at bedtime.     bimatoprost 0.01 % Soln  Commonly known as:  LUMIGAN  Place 1 drop into both eyes at bedtime.     carvedilol 12.5 MG tablet  Commonly known as:  COREG  Take 12.5 mg by mouth 2 (two) times daily with a meal.      cilostazol 100 MG tablet  Commonly known as:  PLETAL  Take 100 mg by mouth 2 (two) times daily.     clopidogrel 75 MG tablet  Commonly known as:  PLAVIX  Take 75 mg by mouth daily.     Glucosamine-Chondroitin 750-600 MG Tabs  Take 1 tablet by mouth 2 (two) times daily.     isosorbide mononitrate 30 MG 24 hr tablet  Commonly known as:  IMDUR  Take 1 tablet (30 mg total) by mouth daily.     lisinopril 20 MG tablet  Commonly known as:  PRINIVIL,ZESTRIL  Take 20 mg by mouth daily.     loratadine 10 MG tablet  Commonly known as:  CLARITIN  Take 10 mg by mouth daily as needed for allergies.     nitroGLYCERIN 0.4 MG SL tablet  Commonly known as:  NITROSTAT  Place 1 tablet (0.4 mg total) under the tongue every 5 (five) minutes as needed for chest pain.     omega-3 acid ethyl esters 1 G capsule  Commonly known as:  LOVAZA  Take 2 g by mouth 2 (two) times daily.     ranitidine 150 MG tablet  Commonly known as:  ZANTAC  Take 150 mg by mouth daily as needed for heartburn.     ranolazine 500 MG 12 hr tablet  Commonly known as:  RANEXA  Take 1 tablet (500 mg total) by mouth 2 (two) times daily.     rosuvastatin 40 MG tablet  Commonly known as:  CRESTOR  Take 1 tablet (40 mg total) by mouth at bedtime.     SENIOR MULTIVITAMIN PLUS PO  Take 1 tablet by mouth daily.         Duration of Discharge Encounter: Greater than 30 minutes including physician time.  Jolene Provost PA-C 03/05/2013 10:47 AM

## 2013-03-05 NOTE — Progress Notes (Signed)
Subjective:  No chest pain.  Objective:  Vital Signs in the last 24 hours: Temp:  [97.5 F (36.4 C)-97.7 F (36.5 C)] 97.7 F (36.5 C) (07/31 0555) Pulse Rate:  [60-61] 60 (07/31 0555) Resp:  [16-20] 20 (07/31 0555) BP: (112-145)/(53-70) 127/53 mmHg (07/31 0555) SpO2:  [95 %-98 %] 95 % (07/31 0555) Weight:  [94.1 kg (207 lb 7.3 oz)] 94.1 kg (207 lb 7.3 oz) (07/31 0011)  Intake/Output from previous day:  Intake/Output Summary (Last 24 hours) at 03/05/13 1005 Last data filed at 03/05/13 0714  Gross per 24 hour  Intake 1833.75 ml  Output   2720 ml  Net -886.25 ml    Physical Exam: General appearance: alert, cooperative and no distress Lungs: clear to auscultation bilaterally Heart: regular rate and rhythm Extremities: some eccymosis Rt groin, no hematoma   Rate: 70  Rhythm: Paced, 4 bts NSVT  Lab Results:  Recent Labs  03/05/13 0440  WBC 4.6  HGB 11.9*  PLT 101*    Recent Labs  03/05/13 0440  NA 140  K 4.1  CL 108  CO2 23  GLUCOSE 109*  BUN 13  CREATININE 1.01   No results found for this basename: TROPONINI, CK, MB,  in the last 72 hours Hepatic Function Panel No results found for this basename: PROT, ALBUMIN, AST, ALT, ALKPHOS, BILITOT, BILIDIR, IBILI,  in the last 72 hours No results found for this basename: CHOL,  in the last 72 hours No results found for this basename: INR,  in the last 72 hours  Imaging: Imaging results have been reviewed  Cardiac Studies:  Assessment/Plan:   Principal Problem:   Unstable angina Active Problems:   S/P angioplasty with stent SVG-OM 7/30./14   Abnormal nuclear cardiac imaging test- high risk Myoview 02/13/13   CAD, CABG '95. S-PD/PL and S-OM PCI in '07.  Low risk Myoview 10/13   Cardiomyopathy, ischemic- EF 40-45% by echo 4/14   Complete heart block- PTVDP '95 with Gen change '02 and '09 (BS). Pacer dependednt   PVD - Lt popliteal occlusion with collaterals '09   Non Hodgkin's lymphoma- Feb 2011- radiation  Rx   Dyslipidemia   HTN (hypertension)    PLAN: Will discuss with MD- probably OK to discharge, follow up with Dr Alanda Amass.  Corine Shelter PA-C Beeper [phone] 03/05/2013, 10:05 AM   I have seen and examined the patient along with Corine Shelter PA-C.  I have reviewed the chart, notes and new data.  I agree with PA's note.  Key new complaints: no angina, same level of intermittent claudication, no groin complaints Key examination changes: healthy access site  PLAN: Discussed role of isosorbide and ranolazine in angina prevention as well as potential side effects and drug interactions. He asked a lot of questions re: PAD and we discussed role of exercise in promoting collateral formation and role of meds. Mandatory uninterrupted dual antiplatelet therapy x 1 year reinforced.  Thurmon Fair, MD, [facility] [phone] 03/05/2013, 11:19 AM

## 2013-03-05 NOTE — Progress Notes (Signed)
Pt had 4 beats of V-tach, pt in no distress, denied SOB, Denied CP, VSS as charted will continue to monitor patient.

## 2013-03-10 ENCOUNTER — Other Ambulatory Visit: Payer: Self-pay

## 2013-03-10 MED ORDER — PANTOPRAZOLE SODIUM 40 MG PO TBEC: 40.0000 mg | DELAYED_RELEASE_TABLET | Freq: Every day | ORAL | Status: DC

## 2013-03-10 MED ORDER — CLOPIDOGREL BISULFATE 75 MG PO TABS: 75.0000 mg | ORAL_TABLET | Freq: Every day | ORAL | Status: DC

## 2013-03-10 NOTE — Telephone Encounter (Signed)
Rx was sent to pharmacy electronically via [employer].  

## 2013-03-17 DIAGNOSIS — I739 Peripheral vascular disease, unspecified: Secondary | ICD-10-CM | POA: Diagnosis not present

## 2013-03-17 DIAGNOSIS — Z4502 Encounter for adjustment and management of automatic implantable cardiac defibrillator: Secondary | ICD-10-CM | POA: Diagnosis not present

## 2013-03-17 DIAGNOSIS — I2510 Atherosclerotic heart disease of native coronary artery without angina pectoris: Secondary | ICD-10-CM | POA: Diagnosis not present

## 2013-03-17 DIAGNOSIS — E782 Mixed hyperlipidemia: Secondary | ICD-10-CM | POA: Diagnosis not present

## 2013-03-17 LAB — PACEMAKER DEVICE OBSERVATION

## 2013-03-19 ENCOUNTER — Encounter (HOSPITAL_COMMUNITY)
Admission: RE | Admit: 2013-03-19 | Discharge: 2013-03-19 | Disposition: A | Discharge status: Home / Self Care | Payer: Medicare Other | Source: Ambulatory Visit | Attending: Cardiovascular Disease | Admitting: Cardiovascular Disease

## 2013-03-19 NOTE — Progress Notes (Signed)
Cardiac Rehab Medication Review by a Pharmacist  Does the patient  feel that his/her medications are working for him/her?  yes  Has the patient been experiencing any side effects to the medications prescribed?  no  Does the patient measure his/her own blood pressure or blood glucose at home?  no   Does the patient have any problems obtaining medications due to transportation or finances?   no  Understanding of regimen: excellent Understanding of indications: excellent Potential of compliance: excellent    Pharmacist comments:  [name] is a 79 yo male who presents with list of medications, saying he "always has it." He was able to list his medications without help and displayed very good understanding of his indications. He does not take his BP at home because he says it has been good last couple times he has checked. I have updated his list of medications and have reviewed his allergies. The only changes made involved patient no longer taking protonix and reporting not having taken tylenol in a long time.  David [name]. David [name], PharmD Clinical Pharmacist - Resident Pager: [phone] Phone: [phone] 03/19/2013 8:39 AM

## 2013-03-23 ENCOUNTER — Telehealth: Payer: Self-pay | Admitting: Cardiovascular Disease

## 2013-03-23 ENCOUNTER — Encounter (HOSPITAL_COMMUNITY)
Admission: RE | Admit: 2013-03-23 | Discharge: 2013-03-23 | Disposition: A | Discharge status: Home / Self Care | Payer: Medicare Other | Source: Ambulatory Visit | Attending: Cardiovascular Disease | Admitting: Cardiovascular Disease

## 2013-03-23 NOTE — Telephone Encounter (Signed)
Byrd Hesselbach was calling in regards to this patient and asked that you call her back.

## 2013-03-23 NOTE — Progress Notes (Signed)
Roe Coombs reported for his first day of exercise at cardiac rehab today.  Resting rhythm  AV paced  Rate noted at 60.  Don began exercise on the nustep Don heart rate went up to the 130's.  Roe Coombs said he felt his heart beating and some slight chest discomfort.  Exercise stopped. Upon inspection of ECG unable to determine whether Don's rhythm was paced.  Exercise stopped. [name] PAC called and notified of Patient's rhythm with exercise and symptoms.  [name] came to cardiac rehab and evaluated Mr Dowis and his rhythm strips and determined that Mr Amano is okay to go home. Mr Goldfarb heart rate went back down to the 60's at rest. [name] PAc took the EKG tracing to the [facility] cardiologist in the hospital for review. [name] said that Mr Willette can return to exercise on Wednesday.  Roe Coombs did not have any complaints upon exit from cardiac rehab. Will fax exercise flow sheets to Dr. Alanda Amass office for review.

## 2013-03-25 ENCOUNTER — Telehealth (HOSPITAL_COMMUNITY): Payer: Self-pay | Admitting: Family Medicine

## 2013-03-25 ENCOUNTER — Encounter (HOSPITAL_COMMUNITY): Payer: Medicare Other

## 2013-03-25 ENCOUNTER — Telehealth (HOSPITAL_COMMUNITY): Payer: Self-pay | Admitting: *Deleted

## 2013-03-25 ENCOUNTER — Telehealth: Payer: Self-pay | Admitting: Cardiovascular Disease

## 2013-03-25 NOTE — Telephone Encounter (Signed)
Needs to speak to you about this pt

## 2013-03-26 ENCOUNTER — Telehealth: Payer: Self-pay | Admitting: Cardiovascular Disease

## 2013-03-26 NOTE — Telephone Encounter (Signed)
i talked to [facility] about this pt. And gave her the instructions from dr. Alanda Amass

## 2013-03-26 NOTE — Telephone Encounter (Signed)
Pt was told to call us by cardiac rehab to change his medications. Pt asked to speak to [facility].

## 2013-03-26 NOTE — Telephone Encounter (Signed)
Dr. Alanda Amass has spoken to [facility] at cardiac rehab.

## 2013-03-27 ENCOUNTER — Encounter (HOSPITAL_COMMUNITY): Payer: Medicare Other

## 2013-03-27 NOTE — Telephone Encounter (Signed)
Pt still waiting to find out how to take his Betablocker-need to know this asap.

## 2013-03-27 NOTE — Telephone Encounter (Signed)
Returned call.  Pt stated he was told by Byrd Hesselbach in Cardiac Rehab that she talked to Dr. Alanda Amass and he was going to increase his beta blocker.  Stated he doesn't know what the increase is and can't go to rehab until he does.  Pt informed Dr. Epimenio Sarin, LPN are not in the office today and will be out on Monday as well in the [place] office.  Pt also informed JC did document that Dr. Alanda Amass spoke w/ Byrd Hesselbach, but not what the plan was.  Pt advised to hold Cardiac Rehab until he hears from JC next week.  Also informed RN will call JC on Monday to discuss.  Pt verbalized understanding and agreed w/ plan.

## 2013-03-30 ENCOUNTER — Telehealth (HOSPITAL_COMMUNITY): Payer: Self-pay | Admitting: *Deleted

## 2013-03-30 ENCOUNTER — Encounter (HOSPITAL_COMMUNITY): Payer: Medicare Other

## 2013-03-30 NOTE — Telephone Encounter (Signed)
JC, LPN will discuss w/ Dr. Alanda Amass.

## 2013-03-30 NOTE — Telephone Encounter (Signed)
Still have not gotten the directions for increasing his Beta Blocker-been  waiting for several days-this is holding him up from his Rehab.

## 2013-03-30 NOTE — Telephone Encounter (Signed)
JC, LPN notified this morning and stated he will call pt.

## 2013-03-31 NOTE — Telephone Encounter (Signed)
Pt. Informed of corrected dose of coreg. Pt. Stated understanding of indtructions

## 2013-03-31 NOTE — Telephone Encounter (Signed)
See previous note

## 2013-04-01 ENCOUNTER — Encounter: Payer: Self-pay | Admitting: Cardiovascular Disease

## 2013-04-01 ENCOUNTER — Encounter (HOSPITAL_COMMUNITY): Payer: Medicare Other

## 2013-04-01 ENCOUNTER — Telehealth (HOSPITAL_COMMUNITY): Payer: Self-pay | Admitting: *Deleted

## 2013-04-01 NOTE — Telephone Encounter (Signed)
Absent today from exercise.  Inquired regarding status.  Advised to please call rehab department.

## 2013-04-03 ENCOUNTER — Encounter (HOSPITAL_COMMUNITY): Admission: RE | Admit: 2013-04-03 | Payer: Medicare Other | Source: Ambulatory Visit

## 2013-04-03 ENCOUNTER — Telehealth (HOSPITAL_COMMUNITY): Payer: Self-pay | Admitting: *Deleted

## 2013-04-03 NOTE — Telephone Encounter (Signed)
Message left on answering machine for cardiac rehab.  Pt will be absent today form exercise due to loose stools last evening.

## 2013-04-03 NOTE — Telephone Encounter (Signed)
Pt returned cal from message left on his home answering machine.  Pt did communicate with [facility] regarding medication change.  Pt is to double his usual dosage of correg.  Pt plans to start rehab on Friday 8/29.  Pt has plans to go out of town next Friday the 5th for two weeks.

## 2013-04-06 ENCOUNTER — Encounter (HOSPITAL_COMMUNITY): Payer: Medicare Other

## 2013-04-08 ENCOUNTER — Telehealth: Payer: Self-pay | Admitting: Cardiovascular Disease

## 2013-04-08 ENCOUNTER — Encounter (HOSPITAL_COMMUNITY)
Admission: RE | Admit: 2013-04-08 | Discharge: 2013-04-08 | Disposition: A | Discharge status: Home / Self Care | Payer: Medicare Other | Source: Ambulatory Visit | Attending: Cardiovascular Disease | Admitting: Cardiovascular Disease

## 2013-04-08 NOTE — Telephone Encounter (Signed)
Dr. Alanda Amass spoke to David [name] at cardiac rehab and spoke with David [name] about his systems and instructed pt. To make an appt.

## 2013-04-08 NOTE — Telephone Encounter (Signed)
Pt is at Rehab -having chest pains-need to be seen asap.

## 2013-04-08 NOTE — Telephone Encounter (Deleted)
Message forwarded to J.C. Wildman, LPN.  

## 2013-04-08 NOTE — Progress Notes (Signed)
David [name] reported for his second day of exercise today at cardiac rehab. Don experienced chest tightness on the nustep  telemetry rhythm paced rate went up to 130. Exercise stopped.  Heart rate returned to the 70's after activity stopped. [name] chest discomfort went away when he stopped exercising. Dr Kandis Cocking office called and notified. Dr Alanda Amass spoke with [name] over the phone and is okay with David [name] Going home. David [name] was planning to go to [facility] tomorrow for 2 weeks.  Dr Alanda Amass told [name] over the phone that he is okay with him going to [place] and wants David [name] to have a stress test when he returns.  We will discharge Don from cardiac rehab at this time and bring him back when after he is cleared by Dr Alanda Amass to exercise.

## 2013-04-08 NOTE — Telephone Encounter (Signed)
Patient has called to schedule a stress test that was ordered by Dr. Alanda Amass.(  I do not see a stress test mentioned in the note).  Also, he patient states that he had a similar episode a couple of weeks ago and Dr. Alanda Amass increased his Coreg to 3 daily.  This did not seem to help--can he go back to taking only 2 daily?

## 2013-04-08 NOTE — Telephone Encounter (Signed)
Message forwarded to J.C. Wildman, LPN.  

## 2013-04-08 NOTE — Telephone Encounter (Signed)
No changes in meds pt to let me know when he gets back from [place] and we will set up a stress test per Dr. Mancel Parsons instructions

## 2013-04-10 ENCOUNTER — Encounter (HOSPITAL_COMMUNITY): Payer: Medicare Other

## 2013-04-13 ENCOUNTER — Encounter (HOSPITAL_COMMUNITY): Payer: Medicare Other

## 2013-04-15 ENCOUNTER — Encounter (HOSPITAL_COMMUNITY): Payer: Medicare Other

## 2013-04-17 ENCOUNTER — Encounter (HOSPITAL_COMMUNITY): Payer: Medicare Other

## 2013-04-20 ENCOUNTER — Encounter (HOSPITAL_COMMUNITY): Payer: Medicare Other

## 2013-04-20 NOTE — Telephone Encounter (Signed)
Pt needs an order put in for a stress test. Pt will switch to Dr. Salena Saner for his physician

## 2013-04-22 ENCOUNTER — Encounter (HOSPITAL_COMMUNITY): Payer: Medicare Other

## 2013-04-24 ENCOUNTER — Encounter (HOSPITAL_COMMUNITY): Payer: Medicare Other

## 2013-04-27 ENCOUNTER — Other Ambulatory Visit (HOSPITAL_COMMUNITY): Payer: Self-pay | Admitting: Cardiovascular Disease

## 2013-04-27 ENCOUNTER — Telehealth: Payer: Self-pay | Admitting: Cardiovascular Disease

## 2013-04-27 ENCOUNTER — Encounter (HOSPITAL_COMMUNITY): Payer: Medicare Other

## 2013-04-27 ENCOUNTER — Encounter: Payer: Self-pay | Admitting: Cardiovascular Disease

## 2013-04-27 DIAGNOSIS — Z23 Encounter for immunization: Secondary | ICD-10-CM | POA: Diagnosis not present

## 2013-04-27 DIAGNOSIS — R079 Chest pain, unspecified: Secondary | ICD-10-CM

## 2013-04-27 NOTE — Telephone Encounter (Signed)
Message forwarded to [facility]. Berlinda Last, LPN to discuss w/ Dr. Alanda Amass.  This note and paper chart# [id number] placed on Dr. Kandis Cocking cart.

## 2013-04-27 NOTE — Telephone Encounter (Signed)
Pt. Will be scheduled for a lexiscan this week and he is aware of this

## 2013-04-27 NOTE — Telephone Encounter (Signed)
Patient states he needs a stress test per communication between cardiac rehad and Dr. Alanda Amass.  There is no order for a stress test.

## 2013-04-29 ENCOUNTER — Encounter (HOSPITAL_COMMUNITY): Payer: Medicare Other

## 2013-04-30 DIAGNOSIS — H259 Unspecified age-related cataract: Secondary | ICD-10-CM | POA: Diagnosis not present

## 2013-04-30 DIAGNOSIS — H40059 Ocular hypertension, unspecified eye: Secondary | ICD-10-CM | POA: Diagnosis not present

## 2013-04-30 DIAGNOSIS — H02059 Trichiasis without entropian unspecified eye, unspecified eyelid: Secondary | ICD-10-CM | POA: Diagnosis not present

## 2013-05-01 ENCOUNTER — Ambulatory Visit (HOSPITAL_COMMUNITY)
Admission: RE | Admit: 2013-05-01 | Discharge: 2013-05-01 | Disposition: A | Discharge status: Home / Self Care | Payer: Medicare Other | Source: Ambulatory Visit | Attending: Cardiovascular Disease | Admitting: Cardiovascular Disease

## 2013-05-01 ENCOUNTER — Encounter (HOSPITAL_COMMUNITY): Payer: Medicare Other

## 2013-05-01 DIAGNOSIS — R079 Chest pain, unspecified: Secondary | ICD-10-CM | POA: Insufficient documentation

## 2013-05-01 MED ORDER — REGADENOSON 0.4 MG/5ML IV SOLN
0.4000 mg | Freq: Once | INTRAVENOUS | Status: AC
  Administered 2013-05-01: 0.4 mg via INTRAVENOUS

## 2013-05-01 MED ORDER — TECHNETIUM TC 99M SESTAMIBI GENERIC - CARDIOLITE
10.8000 | Freq: Once | INTRAVENOUS | Status: AC | PRN
  Administered 2013-05-01: 11 via INTRAVENOUS

## 2013-05-01 MED ORDER — TECHNETIUM TC 99M SESTAMIBI GENERIC - CARDIOLITE
31.2000 | Freq: Once | INTRAVENOUS | Status: AC | PRN
  Administered 2013-05-01: 31.2 via INTRAVENOUS

## 2013-05-01 MED ORDER — AMINOPHYLLINE 25 MG/ML IV SOLN
75.0000 mg | Freq: Once | INTRAVENOUS | Status: AC
  Administered 2013-05-01: 75 mg via INTRAVENOUS

## 2013-05-01 NOTE — Procedures (Addendum)
[facility]ULAR [facility] [address] [place] [address] [place] [address] [phone]  Cardiology Nuclear Med Study  David [name] is a 79 y.o. male     MRN : [id number]     DOB: 1933-02-09  Procedure Date: 05/01/2013  Nuclear Med Background Indication for Stress Test:  Graft Patency and Stent Patency History:  CAD;MI--1989;CABG X5--1995;STENT/PTCA--03/04/13;PACER;CHF;LVD;CHB;ISCHEMIC CARDIOMYOPATHY;CARDIOVERSION/DEFIBRILLATION--02/05/2013 Cardiac Risk Factors: Claudication, Family History - CAD, History of Smoking, Hypertension, Lipids, Overweight and PVD  Symptoms:  Chest Pain, Dizziness, DOE and SOB   Nuclear Pre-Procedure Caffeine/Decaff Intake:  7:00pm NPO After: 5:00am   IV Site: R Forearm  IV 0.9% NS with Angio Cath:  22g  Chest Size (in):  42"  IV Started by: Emmit Pomfret, RN  Height: 5\' 10"  (1.778 m)  Cup Size: n/a  BMI:  Body mass index is 29.41 kg/(m^2). Weight:  205 lb (92.987 kg)   Tech Comments:  N/A    Nuclear Med Study 1 or 2 day study: 1 day  Stress Test Type:  Lexiscan  Order Authorizing Provider:  Hazeline Junker   Resting Radionuclide: Technetium 79m Sestamibi  Resting Radionuclide Dose: 10.8 mCi   Stress Radionuclide:  Technetium 18m Sestamibi  Stress Radionuclide Dose: 31.2 mCi           Stress Protocol Rest HR:60 Stress HR: 70  Rest BP:113/59 Stress BP: 135/69  Exercise Time (min): n/a METS: n/a          Dose of Adenosine (mg):  n/a Dose of Lexiscan: 0.4 mg  Dose of Atropine (mg): n/a Dose of Dobutamine: n/a mcg/kg/min (at max HR)  Stress Test Technologist: Ernestene Mention, CCT Nuclear Technologist: Gonzella Lex, CNMT   Rest Procedure:  Myocardial perfusion imaging was performed at rest 45 minutes following the intravenous administration of Technetium 44m Sestamibi. Stress Procedure:  The patient received IV Lexiscan 0.4 mg over 15-seconds.  Technetium 59m Sestamibi injected at 30-seconds.  Due to patient's  shortness of breath, stomach pains, drop in blood pressure and light-headedness, he was given IV Aminophylline 75 mg. Symptoms were resolved during recovery.  There were no significant changes with Lexiscan.  Quantitative spect images were obtained after a 45 minute delay.  Transient Ischemic Dilatation (Normal <1.22):  1.06 Lung/Heart Ratio (Normal <0.45):  0.34 QGS EDV:  105 ml QGS ESV:  59 ml LV Ejection Fraction: 44%  Signed by      Rest ECG: AV sequential paced rhythm  Stress ECG: No significant change from baseline ECG  QPS Raw Data Images:  Normal; no motion artifact; normal heart/lung ratio. Stress Images:  There is a moderate to large area of severe reduction in tracer uptake in the apex. There is a large area of moderate to severe reduction in tracer uptake in the inferior wall. Rest Images:  Comparison with the stress images reveals no significant change. Subtraction (SDS):  Fixed defects are compatible with prior infarction in the apex and inferior wall. No evidence of reversible ischemia. LV Wall Motion:  Apical dyskinesisInferior hypokinesis. Mildly depressed overall systolic function.Calculated EF is 44%, but may be an overestimation. Visually, LVEF is approximately 35-40%.  Impression Exercise Capacity:  Lexiscan with no exercise. BP Response:  Normal blood pressure response. Clinical Symptoms:  No significant symptoms noted. ECG Impression:  Paced rhythm  Comparison with Prior Nuclear Study: The previous area of inferolateral ischemia now shows normal perfusion. Otherwise, no significant change from previous study   Overall Impression:  Low risk stress nuclear study Extensive areas  of scar in the distribution of the distal LAD artery and right coronary artery. No reversible abnormalities are seen.Thurmon Fair, MD  05/01/2013 12:41 PM

## 2013-05-04 ENCOUNTER — Encounter (HOSPITAL_COMMUNITY): Payer: Medicare Other

## 2013-05-06 ENCOUNTER — Encounter (HOSPITAL_COMMUNITY): Payer: Medicare Other

## 2013-05-07 NOTE — CV Procedure (Signed)
ZAC TORTI is a 79 y.o. male    [id number]  [id number] LOCATION:  FACILITY: [facility]  PHYSICIAN: Lennette Bihari, MD, [facility] 04/26/1933   DATE OF PROCEDURE:  03/04/2013    [facility]  CARDIAC CATHETERIZATION     HISTORY:  Mr. Jahan Friedlander is a 79 year old male who has known coronary artery disease. He underwent CABG revascularization surgery in 1995 with LIMA to the LAD, vein to the PDA PL, and vein sequentially to the OM1 and OM 2 vessel. His last cardiac catheterization was in 2007. He status post stenting to the vein to the PDA PL vessel and the proximal portion of the vein graft supplying the circumflex marginal system. He recently experienced an episode of chest pain which led to a nuclear perfusion study prior to a trip to [place]. This was interpreted as high risk and he now presents for definitive cardiac catheterization and possible percutaneous coronary intervention. The patient also has a history of complete heart block and is status post initial permanent pacemaker insertion in 1995, with generator changes subsequently in 2002 and in 2009. Mr Petta has a history of non-Hodgkin's lymphoma, hypertension, and dyslipidemia.   PROCEDURE:  The patient was brought to the second floor [place] [facility] cath lab in the postabsorptive state. He  Was premedicated with Versed 2 mg and fentanyl micrograms intravenously. His right groin was prepped and shaved in usual sterile fashion. Xylocaine 1% was used for local anesthesia. A 5 French sheath was inserted into the right femoral artery. Diagnostic catheterizatiion was done with 5French LF4, FR4, LIMA and pigtail catheters. Left ventriculography was done with 25 cc Omnipaque contrast. With the demonstration of significant stenosis in the vein graft supplying the circumflex system 90% in-stent stenosis followed by diffuse 90% stenosis just beyond the proximally placed stent decision was made to pursue intervention.    Arterial sheath was exchanged for 6 French sheath. Angiomax bolus plus infusion was administered in addition to 300 mg of Plavix. A 6 French left bypass graft guide was used. A filter wire was able to be advanced into the vein graft distally after documentation of therapeutic anticoagulation initially to reduce potential for distal embolization during the procedure in this 79 year old graft.  A 3.5x15 mm Angiosculpt balloon was inserted and several dilatations at 4,5,7,and8 atmospheres were made within the stented segment. A 3.5x28 mm Promus Premier DES stent was then inserted which covered both the 90% in-stent stenosis site as well as the  90% stenosis just beyond the stented segment. This was dilated x2 at 15 and 16 atmospheres. A 4.0x15 mm [place] Trek was used for post stent dilatation up to 14 atmospheres. Scout angiography confirmed an excellent angiographic result. There was brisk TIMI-3 flow. There is no evidence for dissection. During the procedure the patient also received IC nitroglycerin and also received an additional milligram of Versed and fentanyl 25 mcg for sedation. He left the catheterization laboratory in stable condition with the sheath in place with plans for sheath removal later today per  HEMODYNAMICS:   Central Aorta: 122/57   Left Ventricle: 122/13  ANGIOGRAPHY:  Fluoroscopy revealed significant coronary calcification.  1. Left main: Normal vessel which essentially gave rise to an intermediate vessel and left circumflex. The LAD was occluded at its origin.  2. LAD: Occluded at the origin arising from the left main. 3. Left circumflex: Total proximal occlusion. 4. Right coronary artery: Diffusely diseased proximally then totally occluded after a small marginal branch in its mid  segment. There is faint collateralization to distal circumflex from a RV marginal branch 5.LIMA TO LAD: Widely patent supplying the mid LAD 6. SVG sequentially to the OM1 and on 2 vessel and 4  previously placed proximal stent with in-stent focal narrowing of 90% followed by narrowing of 90% beyond the stented segment proximally. The vessel and an end-to-side anastomosis of the OM1 vessel and end-to-end anastomosis and CO2 vessel. There is mild percent narrowing in the O2 vessel beyond the anastomosis  7. SVG to RCA: Patent with only visualization of the proximal limb to the PDA vessel. There was 20% mild narrowing in the distal portion of the vein graft prior to anastomosing into the PDA vessel. The sequential limb to the PLA was not visualized and was felt to be occluded.       8.  Left ventriculography revealed moderate LV dysfunction with ejection fraction approximately 40-45%. There is an area of apical akinesis to dyskinesis as well as hypokinesis in the distal inferior wall.   Following successful percutaneous coronary intervention to the vein graft supplying the circumflex system, a 90% focal in-stent stenosis followed by 90% stenosis beyond the stented segment were reduced to 0%. There was brisk TIMI-3 flow. There is no evidence for dissection.  IMPRESSION:  Moderate LV dysfunction with ejection fraction of approximately 40-45% and evidence for severe akinesis to dyskinesis involving the apex as well as hypokinesis involving the distal inferior wall.  Significant native coronary artery disease with severely calcified coronary arteries and total occlusion of the LAD at its ostium, total occlusion of the proximal circumflex coronary artery, and total occlusion of the mid right coronary artery with faint marginal collateralization to the distal circumflex.   Patent LIMA to the LAD   Patent vein graft to the PDA but with occluded sequential limb which previously supplied the PLA vessel.  Vein graft supplying the OM1 and O2 vessel with 90% focal mid in-stent restenosis  of 90% stenosis distal to the previously placed proximal stent  Successful Cutting Balloon/stenting withl 90%  segmental stenoses being reduced to 0% with insertion of a 3.5x28 mm Promus Premier DES stent post dilated to approximately 4.0 mm.  Bivalirudin/Plavix 300 mg/IC nitroglycerin.  Lennette Bihari, MD, [facility] 05/07/2013 8:45 PM

## 2013-05-08 ENCOUNTER — Encounter (HOSPITAL_COMMUNITY): Payer: Medicare Other

## 2013-05-11 ENCOUNTER — Encounter (HOSPITAL_COMMUNITY): Payer: Medicare Other

## 2013-05-13 ENCOUNTER — Encounter (HOSPITAL_COMMUNITY): Payer: Medicare Other

## 2013-05-15 ENCOUNTER — Encounter (HOSPITAL_COMMUNITY): Payer: Medicare Other

## 2013-05-15 ENCOUNTER — Telehealth: Payer: Self-pay | Admitting: *Deleted

## 2013-05-15 NOTE — Telephone Encounter (Signed)
Signed cardiac rehab order faxed to [facility]

## 2013-05-18 ENCOUNTER — Encounter (HOSPITAL_COMMUNITY): Payer: Medicare Other

## 2013-05-20 ENCOUNTER — Encounter (HOSPITAL_COMMUNITY): Payer: Medicare Other

## 2013-05-22 ENCOUNTER — Encounter (HOSPITAL_COMMUNITY): Payer: Medicare Other

## 2013-05-25 ENCOUNTER — Encounter (HOSPITAL_COMMUNITY): Payer: Medicare Other

## 2013-05-26 ENCOUNTER — Telehealth: Payer: Self-pay | Admitting: Cardiovascular Disease

## 2013-05-26 ENCOUNTER — Other Ambulatory Visit: Payer: Self-pay | Admitting: Cardiovascular Disease

## 2013-05-26 DIAGNOSIS — Z79899 Other long term (current) drug therapy: Secondary | ICD-10-CM | POA: Diagnosis not present

## 2013-05-26 NOTE — Telephone Encounter (Signed)
Paper chart requested as pt is a former [place] pt and no lab orders in Epic.

## 2013-05-26 NOTE — Telephone Encounter (Signed)
Pt has a question about his bloodwork. Can someone call him to let him know if his test is fasting.

## 2013-05-26 NOTE — Telephone Encounter (Signed)
Returned call.  Left message that call returned to mobile and message given not receiving calls and to call back before 4pm.  Lab slip in chart does not require fasting.

## 2013-05-26 NOTE — Telephone Encounter (Signed)
Pt called back and confirmed tests in chart.  Fasting not required.  Pt verbalized understanding and agreed w/ plan.

## 2013-05-27 ENCOUNTER — Encounter (HOSPITAL_COMMUNITY): Payer: Medicare Other

## 2013-05-27 LAB — CBC WITH DIFFERENTIAL/PLATELET
Basophils Relative: 0 % (ref 0–1)
Eosinophils Absolute: 0.2 10*3/uL (ref 0.0–0.7)
MCH: 30.9 pg (ref 26.0–34.0)
MCHC: 34.3 g/dL (ref 30.0–36.0)
Neutrophils Relative %: 64 % (ref 43–77)
Platelets: 152 10*3/uL (ref 150–400)
RBC: 3.88 MIL/uL — ABNORMAL LOW (ref 4.22–5.81)

## 2013-05-27 LAB — COMPREHENSIVE METABOLIC PANEL
ALT: 22 U/L (ref 0–53)
AST: 19 U/L (ref 0–37)
Alkaline Phosphatase: 46 U/L (ref 39–117)
Sodium: 139 mEq/L (ref 135–145)
Total Bilirubin: 0.7 mg/dL (ref 0.3–1.2)
Total Protein: 6.8 g/dL (ref 6.0–8.3)

## 2013-05-28 ENCOUNTER — Encounter (HOSPITAL_COMMUNITY)
Admission: RE | Admit: 2013-05-28 | Discharge: 2013-05-28 | Disposition: A | Discharge status: Home / Self Care | Payer: Medicare Other | Source: Ambulatory Visit | Attending: Cardiovascular Disease | Admitting: Cardiovascular Disease

## 2013-05-28 DIAGNOSIS — I739 Peripheral vascular disease, unspecified: Secondary | ICD-10-CM | POA: Insufficient documentation

## 2013-05-28 DIAGNOSIS — R9439 Abnormal result of other cardiovascular function study: Secondary | ICD-10-CM | POA: Insufficient documentation

## 2013-05-28 DIAGNOSIS — Z79899 Other long term (current) drug therapy: Secondary | ICD-10-CM | POA: Insufficient documentation

## 2013-05-28 DIAGNOSIS — Z950 Presence of cardiac pacemaker: Secondary | ICD-10-CM | POA: Insufficient documentation

## 2013-05-28 DIAGNOSIS — Z5189 Encounter for other specified aftercare: Secondary | ICD-10-CM | POA: Insufficient documentation

## 2013-05-28 DIAGNOSIS — I2589 Other forms of chronic ischemic heart disease: Secondary | ICD-10-CM | POA: Insufficient documentation

## 2013-05-28 DIAGNOSIS — Z9861 Coronary angioplasty status: Secondary | ICD-10-CM | POA: Insufficient documentation

## 2013-05-28 DIAGNOSIS — I25810 Atherosclerosis of coronary artery bypass graft(s) without angina pectoris: Secondary | ICD-10-CM | POA: Insufficient documentation

## 2013-05-28 DIAGNOSIS — I200 Unstable angina: Secondary | ICD-10-CM | POA: Insufficient documentation

## 2013-05-28 NOTE — Progress Notes (Signed)
Cardiac Rehab Medication Review by a Pharmacist  Does the patient  feel that his/her medications are working for him/her?  yes  Has the patient been experiencing any side effects to the medications prescribed?  no  Does the patient measure his/her own blood pressure or blood glucose at home?  no  Does the patient have any problems obtaining medications due to transportation or finances?   no  Understanding of regimen: excellent Understanding of indications: good Potential of compliance: excellent    Pharmacist comments: Mr. Baranowski demonstrates an excellent understanding of his medication regimen, directions, and indications. He states he does not check his BP at home but has frequent doctor visits so he gets it checked often. Mr. Martinson describes excellent adherence.  Jaselle Pryer C. Shawnese Magner, PharmD Clinical Pharmacist-Resident Pager: [phone] Pharmacy: [phone] 05/28/2013 8:44 AM

## 2013-05-29 ENCOUNTER — Encounter (HOSPITAL_COMMUNITY): Payer: Medicare Other

## 2013-06-01 ENCOUNTER — Encounter (HOSPITAL_COMMUNITY): Payer: Medicare Other

## 2013-06-01 ENCOUNTER — Telehealth (HOSPITAL_COMMUNITY): Payer: Self-pay | Admitting: Family Medicine

## 2013-06-03 ENCOUNTER — Encounter (HOSPITAL_COMMUNITY)
Admission: RE | Admit: 2013-06-03 | Discharge: 2013-06-03 | Disposition: A | Discharge status: Home / Self Care | Payer: Medicare Other | Source: Ambulatory Visit | Attending: Cardiovascular Disease | Admitting: Cardiovascular Disease

## 2013-06-03 ENCOUNTER — Encounter (HOSPITAL_COMMUNITY): Payer: Medicare Other

## 2013-06-03 ENCOUNTER — Telehealth: Payer: Self-pay | Admitting: Cardiovascular Disease

## 2013-06-03 DIAGNOSIS — I739 Peripheral vascular disease, unspecified: Secondary | ICD-10-CM | POA: Diagnosis not present

## 2013-06-03 DIAGNOSIS — I2589 Other forms of chronic ischemic heart disease: Secondary | ICD-10-CM | POA: Diagnosis not present

## 2013-06-03 DIAGNOSIS — Z5189 Encounter for other specified aftercare: Secondary | ICD-10-CM | POA: Diagnosis not present

## 2013-06-03 DIAGNOSIS — Z950 Presence of cardiac pacemaker: Secondary | ICD-10-CM | POA: Diagnosis not present

## 2013-06-03 DIAGNOSIS — I200 Unstable angina: Secondary | ICD-10-CM | POA: Diagnosis not present

## 2013-06-03 DIAGNOSIS — Z9861 Coronary angioplasty status: Secondary | ICD-10-CM | POA: Diagnosis not present

## 2013-06-03 DIAGNOSIS — I25810 Atherosclerosis of coronary artery bypass graft(s) without angina pectoris: Secondary | ICD-10-CM | POA: Diagnosis not present

## 2013-06-03 DIAGNOSIS — R9439 Abnormal result of other cardiovascular function study: Secondary | ICD-10-CM | POA: Diagnosis not present

## 2013-06-03 DIAGNOSIS — Z79899 Other long term (current) drug therapy: Secondary | ICD-10-CM | POA: Diagnosis not present

## 2013-06-03 NOTE — Telephone Encounter (Signed)
Call from [facility] in Cardiac Rehab.  Stated today is pt's first day at rehab and HR in 130s.  Stated pt has pacer and target HR is 114.  Pt was seen by Dr. Alanda Amass, but Dr. [name] is on new orders.  Would like parameters for HR.  Informed will notify Dr. [name] for further instructions.  Dr. [name] notified and paper chart requested.    Paper chart was received and RN was handling two urgent calls and unable to notify Dr. [name].  Per [name] called back and stated she to the information she needed from [place].

## 2013-06-03 NOTE — Progress Notes (Addendum)
Pt in today for his first day of exercise at the 9:45 exercise class time.  Pt tolerated exercise with no complaints of cp or sob.  Heart rate however did easily exceed his target HR based upon 80% of age predicted max. Heart rate of 89.  Contacted [facility] in [facility] for Dr. Rubie Maid.  Okay for heart rate during exercise- 140.  Medications reviewed PHQ2 - 0.  Continue to monitor. Alanson Aly, BSN

## 2013-06-05 ENCOUNTER — Encounter (HOSPITAL_COMMUNITY): Payer: Medicare Other

## 2013-06-05 ENCOUNTER — Encounter (HOSPITAL_COMMUNITY)
Admission: RE | Admit: 2013-06-05 | Discharge: 2013-06-05 | Disposition: A | Discharge status: Home / Self Care | Payer: Medicare Other | Source: Ambulatory Visit | Attending: Cardiovascular Disease | Admitting: Cardiovascular Disease

## 2013-06-08 ENCOUNTER — Encounter (HOSPITAL_COMMUNITY): Payer: Medicare Other

## 2013-06-08 ENCOUNTER — Ambulatory Visit: Payer: Medicare Other | Admitting: Cardiovascular Disease

## 2013-06-08 ENCOUNTER — Encounter (HOSPITAL_COMMUNITY)
Admission: RE | Admit: 2013-06-08 | Discharge: 2013-06-08 | Disposition: A | Discharge status: Home / Self Care | Payer: Medicare Other | Source: Ambulatory Visit | Attending: Cardiovascular Disease | Admitting: Cardiovascular Disease

## 2013-06-08 DIAGNOSIS — R9439 Abnormal result of other cardiovascular function study: Secondary | ICD-10-CM | POA: Insufficient documentation

## 2013-06-08 DIAGNOSIS — I200 Unstable angina: Secondary | ICD-10-CM | POA: Diagnosis not present

## 2013-06-08 DIAGNOSIS — I25810 Atherosclerosis of coronary artery bypass graft(s) without angina pectoris: Secondary | ICD-10-CM | POA: Diagnosis not present

## 2013-06-08 DIAGNOSIS — I2589 Other forms of chronic ischemic heart disease: Secondary | ICD-10-CM | POA: Insufficient documentation

## 2013-06-08 DIAGNOSIS — Z950 Presence of cardiac pacemaker: Secondary | ICD-10-CM | POA: Insufficient documentation

## 2013-06-08 DIAGNOSIS — Z5189 Encounter for other specified aftercare: Secondary | ICD-10-CM | POA: Insufficient documentation

## 2013-06-08 DIAGNOSIS — Z79899 Other long term (current) drug therapy: Secondary | ICD-10-CM | POA: Insufficient documentation

## 2013-06-08 DIAGNOSIS — I739 Peripheral vascular disease, unspecified: Secondary | ICD-10-CM | POA: Insufficient documentation

## 2013-06-08 DIAGNOSIS — Z9861 Coronary angioplasty status: Secondary | ICD-10-CM | POA: Diagnosis not present

## 2013-06-08 NOTE — Progress Notes (Signed)
Today is David [name]'s third day of exercise at cardiac rehab. David [name] reports having some mild chest tightness on the nustep rated a 1 on a 1-10 scale. David [name] does not have any discomfort anywhere else except on the nustep at cardiac rehab. When David [name] exercises using the nustep without arms, he does not having any difficulties. I called [name] to notify. [name] says the discomfort may be musculoskeletal in nature. [name] says David [name] is okay to continue exercise. David [name] has a follow up appointment with Dr [name] this Thursday. No further complaints voiced during exercise.Will continue to monitor the patient throughout  the program.

## 2013-06-09 ENCOUNTER — Encounter: Payer: Self-pay | Admitting: *Deleted

## 2013-06-10 ENCOUNTER — Encounter (HOSPITAL_COMMUNITY)
Admission: RE | Admit: 2013-06-10 | Discharge: 2013-06-10 | Disposition: A | Discharge status: Home / Self Care | Payer: Medicare Other | Source: Ambulatory Visit | Attending: Cardiovascular Disease | Admitting: Cardiovascular Disease

## 2013-06-10 ENCOUNTER — Encounter (HOSPITAL_COMMUNITY): Payer: Medicare Other

## 2013-06-10 NOTE — Progress Notes (Signed)
Reviewed home exercise with pt today.  Pt plans to continue walking at home and using treadmill for exercise.  Reviewed THR, pulse, RPE, sign and symptoms, NTG use, and when to call 911 or MD.  Pt voiced understanding. Fabio Pierce, MA, ACSM RCEP

## 2013-06-11 ENCOUNTER — Other Ambulatory Visit: Payer: Self-pay

## 2013-06-11 ENCOUNTER — Ambulatory Visit (INDEPENDENT_AMBULATORY_CARE_PROVIDER_SITE_OTHER): Payer: Medicare Other | Admitting: Cardiovascular Disease

## 2013-06-11 ENCOUNTER — Encounter: Payer: Self-pay | Admitting: Cardiovascular Disease

## 2013-06-11 VITALS — BP 108/60 | HR 60 | Resp 16 | Ht 70.0 in | Wt 208.5 lb

## 2013-06-11 DIAGNOSIS — E785 Hyperlipidemia, unspecified: Secondary | ICD-10-CM | POA: Diagnosis not present

## 2013-06-11 DIAGNOSIS — I6529 Occlusion and stenosis of unspecified carotid artery: Secondary | ICD-10-CM

## 2013-06-11 DIAGNOSIS — I442 Atrioventricular block, complete: Secondary | ICD-10-CM

## 2013-06-11 DIAGNOSIS — I6521 Occlusion and stenosis of right carotid artery: Secondary | ICD-10-CM

## 2013-06-11 DIAGNOSIS — I2589 Other forms of chronic ischemic heart disease: Secondary | ICD-10-CM

## 2013-06-11 DIAGNOSIS — I739 Peripheral vascular disease, unspecified: Secondary | ICD-10-CM | POA: Diagnosis not present

## 2013-06-11 DIAGNOSIS — I25810 Atherosclerosis of coronary artery bypass graft(s) without angina pectoris: Secondary | ICD-10-CM

## 2013-06-11 DIAGNOSIS — I255 Ischemic cardiomyopathy: Secondary | ICD-10-CM

## 2013-06-11 LAB — PACEMAKER DEVICE OBSERVATION

## 2013-06-11 NOTE — Patient Instructions (Signed)
Since it appears you have had excellent relief from angina, we will try to gradually simplify your medication regimen: - Stop taking isosorbide mononitrate (imdur) - After one to two weeks, if angina has not returned, reduce carvedilol back to your previous dose of 12.5 mg twice a day - After one to two weeks, if angina has not returned, stop Ranexa  If at any point exertional chest pain returns, please call to let us know and restart taking the medication you had just stopped.  Your physician recommends that you schedule a follow-up appointment in: 6 months with in office pacemaker check

## 2013-06-12 ENCOUNTER — Encounter (HOSPITAL_COMMUNITY)
Admission: RE | Admit: 2013-06-12 | Discharge: 2013-06-12 | Disposition: A | Discharge status: Home / Self Care | Payer: Medicare Other | Source: Ambulatory Visit | Attending: Cardiovascular Disease | Admitting: Cardiovascular Disease

## 2013-06-12 ENCOUNTER — Encounter (HOSPITAL_COMMUNITY): Payer: Medicare Other

## 2013-06-14 ENCOUNTER — Encounter: Payer: Self-pay | Admitting: Cardiovascular Disease

## 2013-06-14 DIAGNOSIS — I6521 Occlusion and stenosis of right carotid artery: Secondary | ICD-10-CM | POA: Insufficient documentation

## 2013-06-14 NOTE — Assessment & Plan Note (Signed)
Clinically he does not have manifestations of congestive heart. He is on appropriate carvedilol and ACE inhibitor therapy. He does not require diuretic therapy at this time.

## 2013-06-14 NOTE — Assessment & Plan Note (Signed)
Last had Doppler ultrasonography in July of this year with a 50-69% proximal ICA stenosis; less than 50% on the left side, antegrade flow in vertebrals bilaterally. Monitor yearly

## 2013-06-14 NOTE — Assessment & Plan Note (Addendum)
[employer] Alt[employer]a 2009. He is pacemaker dependent. He has complete heart block but also paces almost 100% of the time the atrium. His leads are unipolar and this is causing some issues with false mode switch due to noise on the atrial channel. The atrial sensitivity is ordered he said fairly high and I don't think we should increase any further since we may miss the opportunity to diagnose atrial fibrillation. The generator will be due for change out in another couple of years and we will discuss whether it is worthwhile putting in a new atrial lead at that time.

## 2013-06-14 NOTE — Progress Notes (Signed)
Patient ID: David [name], male   DOB: 1933-07-05, 79 y.o.   MRN: [id number]      Reason for office visit CAD, complete heart block status post pacemaker, PAD  [name] (he pronounces "OAKS") is here to establish new cardiology followup after David [name] retirement.  [name] returns roughly 3 months status post basement of a stent in the saphenous vein graft to the oblique marginal artery. He is now almost 20 years status post coronary bypass surgery. The same bypass graft had received a stent in 2007. He has mild ischemic cardiomyopathy with an ejection fraction of 40-45%, without clinically evident congestive heart failure. Activities are mostly limited by pain in his left calf and he is known to have occlusion of the popliteal artery in that limb with distal reconstitution via collaterals.  He is now participating in cardiac rehabilitation and occasionally has a little chest tightness, but clearly the claudication is a more important limiting factor. In fact his current chest tightness appears to be different from his previous angina. He wants to reduce the number of medications that he is taking. We've repeated a nuclear stress test on September 26. This shows resolution of the previously reversible inferolateral ischemic defect and no change in the previously described there is a scar in the apex and inferior wall. LVEF is calculated at 44%.  He is pacemaker dependent secondary to complete heart block and his dual chamber [employer] (gen change 2009) is likely to reach ERI in the next couple of years. Note that both his atrial and ventricular leads were placed in 1995 and are unipolar. (Atrial model 438-05, ventricular model 430-07). Interrogation of his device today does show some issues with "noise" on the atrial channel but none on the ventricular channel. Overall device function is normal and he has virtually 100% atrial pacing as well as 100% ventricular pacing.  Mild  carotid artery disease (50-69% proximal internal carotid artery stenosis on the right, mild plaque on the left)  He has a history of non-Hodgkin's lymphoma with a large neck mass but also mediastinal and abdominal lymphadenopathy treated with radiotherapy to the cervical area in 2011 followed by chemotherapy with Rituxan and fludarabine. He is considered to be in remission.   Allergies  Allergen Reactions  . Azithromycin Diarrhea  . Lactose Intolerance (Gi)   . Niacin And Related Diarrhea  . Sulfa Antibiotics Rash    Current Outpatient Prescriptions  Medication Sig Dispense Refill  . acetaminophen (TYLENOL) 325 MG tablet Take 2 tablets (650 mg total) by mouth every 4 (four) hours as needed for pain or fever.      Marland Kitchen aspirin EC 81 MG tablet Take 81 mg by mouth at bedtime.      . bimatoprost (LUMIGAN) 0.01 % SOLN Place 1 drop into both eyes at bedtime.       . carvedilol (COREG) 12.5 MG tablet 2 tablets (25 mg) in the morning, 1 tablet (12.5 mg) in the evening      . cilostazol (PLETAL) 100 MG tablet Take 100 mg by mouth 2 (two) times daily.      . clopidogrel (PLAVIX) 75 MG tablet Take 1 tablet (75 mg total) by mouth daily.  90 tablet  3  . Glucosamine-Chondroitin 750-600 MG TABS Take 1 tablet by mouth 2 (two) times daily.      . isosorbide mononitrate (IMDUR) 30 MG 24 hr tablet Take 1 tablet (30 mg total) by mouth daily.  30 tablet  11  .  lisinopril (PRINIVIL,ZESTRIL) 20 MG tablet Take 20 mg by mouth daily.      Marland Kitchen loratadine (CLARITIN) 10 MG tablet Take 10 mg by mouth daily as needed for allergies.       . Multiple Vitamins-Minerals (SENIOR MULTIVITAMIN PLUS PO) Take 1 tablet by mouth daily.      . nitroGLYCERIN (NITROSTAT) 0.4 MG SL tablet Place 1 tablet (0.4 mg total) under the tongue every 5 (five) minutes as needed for chest pain.  25 tablet  2  . omega-3 acid ethyl esters (LOVAZA) 1 G capsule Take 2 g by mouth 2 (two) times daily.       . ranitidine (ZANTAC) 150 MG tablet Take 150 mg  by mouth daily as needed for heartburn.       . ranolazine (RANEXA) 500 MG 12 hr tablet Take 1 tablet (500 mg total) by mouth 2 (two) times daily.  60 tablet  11  . rosuvastatin (CRESTOR) 40 MG tablet Take 1 tablet (40 mg total) by mouth at bedtime.      . pantoprazole (PROTONIX) 40 MG tablet Take 1 tablet (40 mg total) by mouth daily.  90 tablet  3   No current facility-administered medications for this visit.    Past Medical History  Diagnosis Date  . CAD (coronary artery disease)   . Hypertension   . Peripheral vascular disease with claudication   . Hyperlipidemia   . Left ventricular dysfunction   . Complete heart block   . Pacemaker   . Anginal pain   . Myocardial infarction 1989  . Arthritis     "hands" (03/04/2013)  . Non Hodgkin's lymphoma 06/2010    "excised from left neck then tx'd w/chemo" (03/04/2013)    Past Surgical History  Procedure Laterality Date  . Anal fissure repair  1990's  . 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 Cardiac catheterization  1989;   . Coronary angioplasty with stent placement  2007; 03/04/2013    ""2 +1"  . Coronary artery bypass graft  09/10/1988    "CABG X5" (03/04/2013) LIMA to LAD,SVG to imtermediate OM1 OM II,SVG to PDA  . Insert / replace / remove pacemaker  1995; 2002; 2009    [employer]  . Superficial lymph node biopsy / excision Left 2011    "neck" (03/04/2013    Family History  Problem Relation Age of Onset  . Diabetes Mother   . Stroke Mother   . Heart attack Father   . Hypertension Sister   . Hyperlipidemia Sister   . Hypertension Sister   . Hyperlipidemia Sister     History   Social History  . Marital Status: Married    Spouse Name: N/A    Number of Children: N/A  . Years of Education: N/A   Occupational History  . Not on file.   Social History Main Topics  . Smoking status: Former Smoker -- 0.12 packs/day for 1 years    Types: Cigarettes    Quit date: 08/06/1956  . Smokeless  tobacco: Never Used  . Alcohol Use: 2.4 oz/week    4 Glasses of wine per week  . Drug Use: No  . Sexual Activity: No   Other Topics Concern  . Not on file   Social History Narrative  . No narrative on file    Review of systems: Left calf intermittent claudication and possible exertional angina pectoris The patient specifically denies any chest pain at rest, dyspnea at rest or with  exertion, orthopnea, paroxysmal nocturnal dyspnea, syncope, palpitations, focal neurological deficits, lower extremity edema, unexplained weight gain, cough, hemoptysis or wheezing.  The patient also denies abdominal pain, nausea, vomiting, dysphagia, diarrhea, constipation, polyuria, polydipsia, dysuria, hematuria, frequency, urgency, abnormal bleeding or bruising, fever, chills, unexpected weight changes, mood swings, change in skin or hair texture, change in voice quality, auditory or visual problems, allergic reactions or rashes, new musculoskeletal complaints other than usual "aches and pains".   PHYSICAL EXAM BP 108/60  Pulse 123  Resp 16  Ht 5\' 10"  (1.778 m)  Wt 208 lb 8 oz (94.575 kg)  BMI 29.92 kg/m2  General: Alert, oriented x3, no distress Head: no evidence of trauma, PERRL, EOMI, no exophtalmos or lid lag, no myxedema, no xanthelasma; normal ears, nose and oropharynx Neck: normal jugular venous pulsations and no hepatojugular reflux; brisk carotid pulses without delay and bilateral carotid bruits Chest: clear to auscultation, no signs of consolidation by percussion or palpation, normal fremitus, symmetrical and full respiratory excursions; healed sternotomy and pacemaker scars Cardiovascular: normal position and quality of the apical impulse, regular rhythm, normal first and paradoxically split second heart sounds, no murmurs, rubs or gallops Abdomen: no tenderness or distention, no masses by palpation, no abnormal pulsatility or arterial bruits, normal bowel sounds, no  hepatosplenomegaly Extremities: no clubbing, cyanosis or edema; 2+ radial, ulnar and brachial pulses bilaterally; 2+ right femoral, posterior tibial and dorsalis pedis pulses; 2+ left femoral, posterior tibial and dorsalis pedis pulses; no subclavian or femoral bruits Neurological: grossly nonfocal   EKG: 100% atrioventricular sequential pacing  Lipid Panel     Component Value Date/Time   CHOL 115 05/07/2011 0903   TRIG 114 05/07/2011 0903   HDL 37* 05/07/2011 0903   CHOLHDL 3.1 05/07/2011 0903   VLDL 23 05/07/2011 0903   LDLCALC 55 05/07/2011 0903    BMET    Component Value Date/Time   NA 139 05/26/2013 1424   K 4.3 05/26/2013 1424   CL 104 05/26/2013 1424   CO2 29 05/26/2013 1424   GLUCOSE 73 05/26/2013 1424   BUN 18 05/26/2013 1424   CREATININE 1.20 05/26/2013 1424   CREATININE 1.01 03/05/2013 0440   CALCIUM 9.0 05/26/2013 1424   GFRNONAA 69* 03/05/2013 0440   GFRAA 80* 03/05/2013 0440     ASSESSMENT AND PLAN Complete heart block- PTVDP '95 with Gen change '02 and '09 (BS). Pacer dependednt [employer] Altrua 2009. He is pacemaker dependent. He has complete heart block but also paces almost 100% of the time the atrium. His leads are unipolar and this is causing some issues with false mode switch due to noise on the atrial channel. The atrial sensitivity is ordered he said fairly high and I don't think we should increase any further since we may miss the opportunity to diagnose atrial fibrillation. The generator will be due for change out in another couple of years and we will discuss whether it is worthwhile putting in a new atrial lead at that time.  CAD s/p CABG 1995, s/p stent SVG-OM 2007 and July 2014 Despite receiving a drug-eluting stent, there is still considerable risk of development of in-stent restenosis in this 79 year-old saphenous vein graft that has sort of been stented in 2007. At this point in time his angina is minimal and his nuclear perfusion study performed just a  month ago does not show any major areas of reversible ischemia. Would not be surprising for him to continue to have angina from a variety of small branches  that her disease. He is receiving near comprehensive anti-anginal therapy with carvedilol, long-acting nitrates, ranolazine, but the doses of all these medications could be increased if necessary. In fact he would like to cut down on the morning dose of carvedilol that was recently increased since he feels that this makes and sluggish. He specifically requests that we try to reduce the number of medications that he is taking. I have given him a timetable to try to wean off some of his antianginal medications.  Cardiomyopathy, ischemic- EF 40-45% by echo 4/14 Clinically he does not have manifestations of congestive heart. He is on appropriate carvedilol and ACE inhibitor therapy. He does not require diuretic therapy at this time.  Dyslipidemia Excellent lipid profile except for slightly low HDL. No changes to medications are recommended.  PVD - Lt popliteal occlusion with collaterals '09    Asymptomatic stenosis of right carotid artery without infarction Last had Doppler ultrasonography in July of this year with a 50-69% proximal ICA stenosis; less than 50% on the left side, antegrade flow in vertebrals bilaterally. Monitor yearly   Patient Instructions  Since it appears you have had excellent relief from angina, we will try to gradually simplify your medication regimen: - Stop taking isosorbide mononitrate (imdur) - After one to two weeks, if angina has not returned, reduce carvedilol back to your previous dose of 12.5 mg twice a day - After one to two weeks, if angina has not returned, stop Ranexa  If at any point exertional chest pain returns, please call to let us know and restart taking the medication you had just stopped.  Your physician recommends that you schedule a follow-up appointment in: 6 months with in office pacemaker  check     Emmanuelle Hibbitts  Thurmon Fair, MD, [facility] [phone] office [phone] pager

## 2013-06-14 NOTE — Assessment & Plan Note (Signed)
Excellent lipid profile except for slightly low HDL. No changes to medications are recommended.

## 2013-06-14 NOTE — Assessment & Plan Note (Addendum)
Despite receiving a drug-eluting stent, there is still considerable risk of development of in-stent restenosis in this 79 year-old saphenous vein graft that has sort of been stented in 2007. At this point in time his angina is minimal and his nuclear perfusion study performed just a month ago does not show any major areas of reversible ischemia. Would not be surprising for him to continue to have angina from a variety of small branches that her disease. He is receiving near comprehensive anti-anginal therapy with carvedilol, long-acting nitrates, ranolazine, but the doses of all these medications could be increased if necessary. In fact he would like to cut down on the morning dose of carvedilol that was recently increased since he feels that this makes and sluggish. He specifically requests that we try to reduce the number of medications that he is taking. I have given him a timetable to try to wean off some of his antianginal medications.

## 2013-06-15 ENCOUNTER — Encounter: Payer: Self-pay | Admitting: Cardiovascular Disease

## 2013-06-15 ENCOUNTER — Encounter (HOSPITAL_COMMUNITY): Payer: Medicare Other

## 2013-06-15 ENCOUNTER — Encounter (HOSPITAL_COMMUNITY)
Admission: RE | Admit: 2013-06-15 | Discharge: 2013-06-15 | Disposition: A | Discharge status: Home / Self Care | Payer: Medicare Other | Source: Ambulatory Visit | Attending: Cardiovascular Disease | Admitting: Cardiovascular Disease

## 2013-06-16 ENCOUNTER — Telehealth (HOSPITAL_COMMUNITY): Payer: Self-pay | Admitting: *Deleted

## 2013-06-17 ENCOUNTER — Encounter (HOSPITAL_COMMUNITY): Payer: Medicare Other

## 2013-06-17 ENCOUNTER — Encounter (HOSPITAL_COMMUNITY)
Admission: RE | Admit: 2013-06-17 | Discharge: 2013-06-17 | Disposition: A | Discharge status: Home / Self Care | Payer: Medicare Other | Source: Ambulatory Visit | Attending: Cardiovascular Disease | Admitting: Cardiovascular Disease

## 2013-06-17 LAB — MDC_IDC_ENUM_SESS_TYPE_INCLINIC
Brady Statistic RV Percent Paced: 100 %
Lead Channel Impedance Value: 470 Ohm
Lead Channel Pacing Threshold Amplitude: 0.7 V
Lead Channel Setting Pacing Amplitude: 2.4 V
Lead Channel Setting Pacing Amplitude: 2.8 V
Lead Channel Setting Sensing Sensitivity: 3.5 mV

## 2013-06-17 NOTE — Progress Notes (Signed)
David [name] 79 y.o. male Nutrition Note Spoke with pt.  Nutrition Plan and Nutrition Survey goals reviewed with pt. Pt is following Step 2 of the Therapeutic Lifestyle Changes diet. Pt wants to lose wt. Pt has been trying to lose wt by decreasing portion sizes. Wt loss tips reviewed. Pt expressed understanding of the information reviewed. Pt aware of nutrition education classes offered.  Nutrition Diagnosis   Food-and nutrition-related knowledge deficit related to lack of exposure to information as related to diagnosis of: ? CVD   Obesity related to excessive energy intake as evidenced by a BMI of 30.2  Nutrition RX/ Estimated Daily Nutrition Needs for: wt loss  1500-2000 Kcal, 40-55 gm fat, 10-13 gm sat fat, 1.5-2.0 gm trans-fat, <1500 mg sodium   Nutrition Intervention   Pt's individual nutrition plan including cholesterol goals reviewed with pt.   Benefits of adopting Therapeutic Lifestyle Changes discussed when Medficts reviewed.   Pt to attend the Portion Distortion class   Pt to attend the  ? Nutrition I class                     ? Nutrition II class - met 06/16/13   Continue client-centered nutrition education by RD, as part of interdisciplinary care.  Goal(s)   Pt to identify food quantities necessary to achieve: ? wt loss to a goal wt of 184-202 lb (83.8-92.0 kg) at graduation from cardiac rehab.   Monitor and Evaluate progress toward nutrition goal with team. Nutrition Risk:  Low   Mickle Plumb, M.Ed, RD, LDN, CDE 06/17/2013 10:36 AM

## 2013-06-19 ENCOUNTER — Encounter (HOSPITAL_COMMUNITY): Payer: Medicare Other

## 2013-06-19 ENCOUNTER — Encounter (HOSPITAL_COMMUNITY)
Admission: RE | Admit: 2013-06-19 | Discharge: 2013-06-19 | Disposition: A | Discharge status: Home / Self Care | Payer: Medicare Other | Source: Ambulatory Visit | Attending: Cardiovascular Disease | Admitting: Cardiovascular Disease

## 2013-06-22 ENCOUNTER — Encounter (HOSPITAL_COMMUNITY)
Admission: RE | Admit: 2013-06-22 | Discharge: 2013-06-22 | Disposition: A | Discharge status: Home / Self Care | Payer: Medicare Other | Source: Ambulatory Visit | Attending: Cardiovascular Disease | Admitting: Cardiovascular Disease

## 2013-06-22 ENCOUNTER — Encounter (HOSPITAL_COMMUNITY): Payer: Medicare Other

## 2013-06-24 ENCOUNTER — Encounter (HOSPITAL_COMMUNITY)
Admission: RE | Admit: 2013-06-24 | Discharge: 2013-06-24 | Disposition: A | Discharge status: Home / Self Care | Payer: Medicare Other | Source: Ambulatory Visit | Attending: Cardiovascular Disease | Admitting: Cardiovascular Disease

## 2013-06-24 ENCOUNTER — Encounter (HOSPITAL_COMMUNITY): Payer: Medicare Other

## 2013-06-26 ENCOUNTER — Encounter (HOSPITAL_COMMUNITY)
Admission: RE | Admit: 2013-06-26 | Discharge: 2013-06-26 | Disposition: A | Discharge status: Home / Self Care | Payer: Medicare Other | Source: Ambulatory Visit | Attending: Cardiovascular Disease | Admitting: Cardiovascular Disease

## 2013-06-26 ENCOUNTER — Encounter (HOSPITAL_COMMUNITY): Payer: Medicare Other

## 2013-06-29 ENCOUNTER — Encounter (HOSPITAL_COMMUNITY): Admission: RE | Admit: 2013-06-29 | Payer: Medicare Other | Source: Ambulatory Visit

## 2013-06-29 ENCOUNTER — Encounter (HOSPITAL_COMMUNITY)
Admission: RE | Admit: 2013-06-29 | Discharge: 2013-06-29 | Disposition: A | Discharge status: Home / Self Care | Payer: Medicare Other | Source: Ambulatory Visit | Attending: Cardiovascular Disease | Admitting: Cardiovascular Disease

## 2013-06-30 ENCOUNTER — Other Ambulatory Visit (HOSPITAL_COMMUNITY): Payer: Self-pay | Admitting: Cardiovascular Disease

## 2013-06-30 ENCOUNTER — Ambulatory Visit (HOSPITAL_COMMUNITY)
Admission: RE | Admit: 2013-06-30 | Discharge: 2013-06-30 | Disposition: A | Discharge status: Home / Self Care | Payer: Medicare Other | Source: Ambulatory Visit | Attending: Cardiovascular Disease | Admitting: Cardiovascular Disease

## 2013-06-30 ENCOUNTER — Telehealth: Payer: Self-pay | Admitting: Internal Medicine

## 2013-06-30 DIAGNOSIS — I739 Peripheral vascular disease, unspecified: Secondary | ICD-10-CM

## 2013-06-30 DIAGNOSIS — I70219 Atherosclerosis of native arteries of extremities with intermittent claudication, unspecified extremity: Secondary | ICD-10-CM

## 2013-06-30 NOTE — Progress Notes (Signed)
Arterial Duplex Lower Ext. Completed. Carmin Dibartolo, BS, RDMS, RVT  

## 2013-06-30 NOTE — Telephone Encounter (Signed)
Pt had

## 2013-07-01 ENCOUNTER — Encounter (HOSPITAL_COMMUNITY)
Admission: RE | Admit: 2013-07-01 | Discharge: 2013-07-01 | Disposition: A | Discharge status: Home / Self Care | Payer: Medicare Other | Source: Ambulatory Visit | Attending: Cardiovascular Disease | Admitting: Cardiovascular Disease

## 2013-07-01 ENCOUNTER — Encounter (HOSPITAL_COMMUNITY): Payer: Medicare Other

## 2013-07-06 ENCOUNTER — Encounter (HOSPITAL_COMMUNITY): Payer: Medicare Other

## 2013-07-06 ENCOUNTER — Encounter (HOSPITAL_COMMUNITY)
Admission: RE | Admit: 2013-07-06 | Discharge: 2013-07-06 | Disposition: A | Discharge status: Home / Self Care | Payer: Medicare Other | Source: Ambulatory Visit | Attending: Cardiovascular Disease | Admitting: Cardiovascular Disease

## 2013-07-06 DIAGNOSIS — I25810 Atherosclerosis of coronary artery bypass graft(s) without angina pectoris: Secondary | ICD-10-CM | POA: Diagnosis not present

## 2013-07-06 DIAGNOSIS — I200 Unstable angina: Secondary | ICD-10-CM | POA: Insufficient documentation

## 2013-07-06 DIAGNOSIS — I2589 Other forms of chronic ischemic heart disease: Secondary | ICD-10-CM | POA: Diagnosis not present

## 2013-07-06 DIAGNOSIS — Z9861 Coronary angioplasty status: Secondary | ICD-10-CM | POA: Insufficient documentation

## 2013-07-06 DIAGNOSIS — Z5189 Encounter for other specified aftercare: Secondary | ICD-10-CM | POA: Insufficient documentation

## 2013-07-06 DIAGNOSIS — Z79899 Other long term (current) drug therapy: Secondary | ICD-10-CM | POA: Insufficient documentation

## 2013-07-06 DIAGNOSIS — Z950 Presence of cardiac pacemaker: Secondary | ICD-10-CM | POA: Diagnosis not present

## 2013-07-06 DIAGNOSIS — R9439 Abnormal result of other cardiovascular function study: Secondary | ICD-10-CM | POA: Diagnosis not present

## 2013-07-06 DIAGNOSIS — I739 Peripheral vascular disease, unspecified: Secondary | ICD-10-CM | POA: Insufficient documentation

## 2013-07-06 NOTE — Progress Notes (Signed)
[facility] for LE doppler results.

## 2013-07-08 ENCOUNTER — Encounter (HOSPITAL_COMMUNITY): Payer: Medicare Other

## 2013-07-08 ENCOUNTER — Encounter (HOSPITAL_COMMUNITY)
Admission: RE | Admit: 2013-07-08 | Discharge: 2013-07-08 | Disposition: A | Discharge status: Home / Self Care | Payer: Medicare Other | Source: Ambulatory Visit | Attending: Cardiovascular Disease | Admitting: Cardiovascular Disease

## 2013-07-09 NOTE — Progress Notes (Signed)
Appt. Scheduled w/Dr. Allyson Sabal 07/23/13.

## 2013-07-10 ENCOUNTER — Encounter (HOSPITAL_COMMUNITY)
Admission: RE | Admit: 2013-07-10 | Discharge: 2013-07-10 | Disposition: A | Discharge status: Home / Self Care | Payer: Medicare Other | Source: Ambulatory Visit | Attending: Cardiovascular Disease | Admitting: Cardiovascular Disease

## 2013-07-10 ENCOUNTER — Encounter (HOSPITAL_COMMUNITY): Payer: Medicare Other

## 2013-07-13 ENCOUNTER — Encounter (HOSPITAL_COMMUNITY)
Admission: RE | Admit: 2013-07-13 | Discharge: 2013-07-13 | Disposition: A | Discharge status: Home / Self Care | Payer: Medicare Other | Source: Ambulatory Visit | Attending: Cardiovascular Disease | Admitting: Cardiovascular Disease

## 2013-07-13 ENCOUNTER — Encounter (HOSPITAL_COMMUNITY): Payer: Medicare Other

## 2013-07-15 ENCOUNTER — Encounter (HOSPITAL_COMMUNITY): Payer: Medicare Other

## 2013-07-15 ENCOUNTER — Encounter (HOSPITAL_COMMUNITY)
Admission: RE | Admit: 2013-07-15 | Discharge: 2013-07-15 | Disposition: A | Discharge status: Home / Self Care | Payer: Medicare Other | Source: Ambulatory Visit | Attending: Cardiovascular Disease | Admitting: Cardiovascular Disease

## 2013-07-17 ENCOUNTER — Encounter (HOSPITAL_COMMUNITY)
Admission: RE | Admit: 2013-07-17 | Discharge: 2013-07-17 | Disposition: A | Discharge status: Home / Self Care | Payer: Medicare Other | Source: Ambulatory Visit | Attending: Cardiovascular Disease | Admitting: Cardiovascular Disease

## 2013-07-17 ENCOUNTER — Encounter (HOSPITAL_COMMUNITY): Payer: Medicare Other

## 2013-07-20 ENCOUNTER — Encounter (HOSPITAL_COMMUNITY)
Admission: RE | Admit: 2013-07-20 | Discharge: 2013-07-20 | Disposition: A | Discharge status: Home / Self Care | Payer: Medicare Other | Source: Ambulatory Visit | Attending: Cardiovascular Disease | Admitting: Cardiovascular Disease

## 2013-07-20 ENCOUNTER — Encounter (HOSPITAL_COMMUNITY): Payer: Medicare Other

## 2013-07-22 ENCOUNTER — Encounter (HOSPITAL_COMMUNITY): Payer: Medicare Other

## 2013-07-22 ENCOUNTER — Telehealth (HOSPITAL_COMMUNITY): Payer: Self-pay | Admitting: Family Medicine

## 2013-07-23 ENCOUNTER — Encounter: Payer: Self-pay | Admitting: Cardiovascular Disease

## 2013-07-23 ENCOUNTER — Ambulatory Visit (INDEPENDENT_AMBULATORY_CARE_PROVIDER_SITE_OTHER): Payer: Medicare Other | Admitting: Cardiovascular Disease

## 2013-07-23 VITALS — BP 130/70 | HR 62 | Ht 69.75 in | Wt 208.0 lb

## 2013-07-23 DIAGNOSIS — D689 Coagulation defect, unspecified: Secondary | ICD-10-CM | POA: Diagnosis not present

## 2013-07-23 DIAGNOSIS — Z79899 Other long term (current) drug therapy: Secondary | ICD-10-CM | POA: Diagnosis not present

## 2013-07-23 DIAGNOSIS — R5381 Other malaise: Secondary | ICD-10-CM | POA: Diagnosis not present

## 2013-07-23 DIAGNOSIS — I739 Peripheral vascular disease, unspecified: Secondary | ICD-10-CM

## 2013-07-23 DIAGNOSIS — Z01818 Encounter for other preprocedural examination: Secondary | ICD-10-CM

## 2013-07-23 DIAGNOSIS — R5383 Other fatigue: Secondary | ICD-10-CM

## 2013-07-23 NOTE — Assessment & Plan Note (Signed)
Patient has a known occluded popliteal artery dating back at least to his angiogram performed by Dr. Alanda Amass 05/04/08. He had large geniculate collaterals with three-vessel runoff. He does have a Lendell Caprice claudication which has improved somewhat on Pletal however he wishes to have this percutaneously revascularized. Most recent Dopplers performed 06/30/13 revealed a right ABI of 1, left of 0.7 with a popliteal artery. The plans are to perform angiography and potential percutaneous transposition using the Viance CTO catheter plus or minus directional atherectomy to debulk plus or minus IDEV stenting.

## 2013-07-23 NOTE — Progress Notes (Signed)
07/23/2013 David [name]   [birth year]  [id number]  Primary Physician Lupita Raider, MD Primary Cardiologist: Runell Gess MD Roseanne Reno   HPI:  Mr. Recinos is  a35 year old married Caucasian male father of 3 children is retired Transport planner for a company. He was initially patient of Dr. Annette Stable Gamble's followed by Dr. Susa Griffins and now Dr. [name]. He has a history of peripheral arterial disease with known moderate right internal carotid artery stenosis but with ultrasound as well as an occluded left popliteal artery by angiography back in 2009. He has geniculate collaterals with three-vessel runoff and lifestyle limiting claudication. His other problems include a history of hypertension and hyperlipidemia. He had coronary bypass grafting 09/10/88 and has had 3 stent procedures since most recently by Dr. Daphene Jaeger on 03/04/13 which time he placed a drug-eluting stent in the obtuse marginal vein graft. He denies chest pain or shortness of breath. He is currently displaying chronic rehabilitation.   Current Outpatient Prescriptions  Medication Sig Dispense Refill  . acetaminophen (TYLENOL) 325 MG tablet Take 2 tablets (650 mg total) by mouth every 4 (four) hours as needed for pain or fever.      Marland Kitchen aspirin EC 81 MG tablet Take 81 mg by mouth at bedtime.      . bimatoprost (LUMIGAN) 0.01 % SOLN Place 1 drop into both eyes at bedtime.       . carvedilol (COREG) 12.5 MG tablet Take 12.5 mg by mouth 2 (two) times daily with a meal.       . cetirizine (ZYRTEC) 10 MG tablet Take 10 mg by mouth daily as needed for allergies.      . cilostazol (PLETAL) 100 MG tablet Take 100 mg by mouth 2 (two) times daily.      . clopidogrel (PLAVIX) 75 MG tablet Take 1 tablet (75 mg total) by mouth daily.  90 tablet  3  . Glucosamine-Chondroitin 750-600 MG TABS Take 1 tablet by mouth 2 (two) times daily.      Marland Kitchen lisinopril (PRINIVIL,ZESTRIL) 20 MG tablet Take 20 mg by mouth daily.      .  Multiple Vitamins-Minerals (SENIOR MULTIVITAMIN PLUS PO) Take 1 tablet by mouth daily.      . nitroGLYCERIN (NITROSTAT) 0.4 MG SL tablet Place 1 tablet (0.4 mg total) under the tongue every 5 (five) minutes as needed for chest pain.  25 tablet  2  . omega-3 acid ethyl esters (LOVAZA) 1 G capsule Take 2 g by mouth 2 (two) times daily.       . pantoprazole (PROTONIX) 40 MG tablet Take 1 tablet (40 mg total) by mouth daily.  90 tablet  3  . rosuvastatin (CRESTOR) 40 MG tablet Take 1 tablet (40 mg total) by mouth at bedtime.       No current facility-administered medications for this visit.    Allergies  Allergen Reactions  . Azithromycin Diarrhea  . Lactose Intolerance (Gi)   . Niacin And Related Diarrhea  . Sulfa Antibiotics Rash    History   Social History  . Marital Status: Married    Spouse Name: N/A    Number of Children: N/A  . Years of Education: N/A   Occupational History  . Not on file.   Social History Main Topics  . Smoking status: Former Smoker -- 0.12 packs/day for 1 years    Types: Cigarettes    Quit date: 08/06/1956  . Smokeless tobacco: Never Used  . Alcohol Use: 2.4 oz/week  4 Glasses of wine per week  . Drug Use: No  . Sexual Activity: No   Other Topics Concern  . Not on file   Social History Narrative  . No narrative on file     Review of Systems: General: negative for chills, fever, night sweats or weight changes.  Cardiovascular: negative for chest pain, dyspnea on exertion, edema, orthopnea, palpitations, paroxysmal nocturnal dyspnea or shortness of breath Dermatological: negative for rash Respiratory: negative for cough or wheezing Urologic: negative for hematuria Abdominal: negative for nausea, vomiting, diarrhea, bright red blood per rectum, melena, or hematemesis Neurologic: negative for visual changes, syncope, or dizziness All other systems reviewed and are otherwise negative except as noted above.    Blood pressure 130/70, pulse 62,  height 5' 9.75" (1.772 m), weight 208 lb (94.348 kg).  General appearance: alert and no distress Neck: no adenopathy, no JVD, supple, symmetrical, trachea midline, thyroid not enlarged, symmetric, no tenderness/mass/nodules and well right carotid bruit Lungs: clear to auscultation bilaterally Heart: regular rate and rhythm, S1, S2 normal, no murmur, click, rub or gallop Extremities: extremities normal, atraumatic, no cyanosis or edema  EKG not performed today  ASSESSMENT AND PLAN:   PVD - Lt popliteal occlusion with collaterals '09 Patient has a known occluded popliteal artery dating back at least to his angiogram performed by Dr. Alanda Amass 05/04/08. He had large geniculate collaterals with three-vessel runoff. He does have a Lendell Caprice claudication which has improved somewhat on Pletal however he wishes to have this percutaneously revascularized. Most recent Dopplers performed 06/30/13 revealed a right ABI of 1, left of 0.7 with a popliteal artery. The plans are to perform angiography and potential percutaneous transposition using the Viance CTO catheter plus or minus directional atherectomy to debulk plus or minus IDEV stenting.      Runell Gess MD FACP,FACC,FAHA, [facility] 07/23/2013 2:55 PM

## 2013-07-23 NOTE — Patient Instructions (Addendum)
Dr. Allyson Sabal has ordered a peripheral angiogram to be done at [facility].  This procedure is going to look at the bloodflow in your lower extremities.  If Dr. Allyson Sabal is able to open up the arteries, you will have to spend one night in the hospital.  If he is not able to open the arteries, you will be able to go home that same day.    After the procedure, you will not be allowed to drive for 3 days or push, pull, or lift anything greater than 10 lbs for one week.    You will be required to have bloodwork prior to your procedure.  Our scheduler will advise you on when these items need to be done.     [employer]

## 2013-07-24 ENCOUNTER — Encounter (HOSPITAL_COMMUNITY): Payer: Medicare Other

## 2013-07-24 ENCOUNTER — Encounter (HOSPITAL_COMMUNITY)
Admission: RE | Admit: 2013-07-24 | Discharge: 2013-07-24 | Disposition: A | Discharge status: Home / Self Care | Payer: Medicare Other | Source: Ambulatory Visit | Attending: Cardiovascular Disease | Admitting: Cardiovascular Disease

## 2013-07-27 ENCOUNTER — Encounter (HOSPITAL_COMMUNITY): Payer: Medicare Other

## 2013-07-27 ENCOUNTER — Encounter (HOSPITAL_COMMUNITY)
Admission: RE | Admit: 2013-07-27 | Discharge: 2013-07-27 | Disposition: A | Discharge status: Home / Self Care | Payer: Medicare Other | Source: Ambulatory Visit | Attending: Cardiovascular Disease | Admitting: Cardiovascular Disease

## 2013-07-29 ENCOUNTER — Encounter (HOSPITAL_COMMUNITY)
Admission: RE | Admit: 2013-07-29 | Discharge: 2013-07-29 | Disposition: A | Discharge status: Home / Self Care | Payer: Medicare Other | Source: Ambulatory Visit | Attending: Cardiovascular Disease | Admitting: Cardiovascular Disease

## 2013-07-29 ENCOUNTER — Encounter (HOSPITAL_COMMUNITY): Payer: Medicare Other

## 2013-08-03 ENCOUNTER — Encounter (HOSPITAL_COMMUNITY)
Admission: RE | Admit: 2013-08-03 | Discharge: 2013-08-03 | Disposition: A | Discharge status: Home / Self Care | Payer: Medicare Other | Source: Ambulatory Visit | Attending: Cardiovascular Disease | Admitting: Cardiovascular Disease

## 2013-08-03 ENCOUNTER — Encounter (HOSPITAL_COMMUNITY): Payer: Medicare Other

## 2013-08-05 ENCOUNTER — Encounter (HOSPITAL_COMMUNITY)
Admission: RE | Admit: 2013-08-05 | Discharge: 2013-08-05 | Disposition: A | Discharge status: Home / Self Care | Payer: Medicare Other | Source: Ambulatory Visit | Attending: Cardiovascular Disease | Admitting: Cardiovascular Disease

## 2013-08-05 ENCOUNTER — Encounter (HOSPITAL_COMMUNITY): Payer: Medicare Other

## 2013-08-07 ENCOUNTER — Encounter (HOSPITAL_COMMUNITY): Payer: Medicare Other

## 2013-08-07 ENCOUNTER — Encounter (HOSPITAL_COMMUNITY)
Admission: RE | Admit: 2013-08-07 | Discharge: 2013-08-07 | Disposition: A | Discharge status: Home / Self Care | Payer: Medicare Other | Source: Ambulatory Visit | Attending: Cardiovascular Disease | Admitting: Cardiovascular Disease

## 2013-08-07 DIAGNOSIS — I200 Unstable angina: Secondary | ICD-10-CM | POA: Insufficient documentation

## 2013-08-07 DIAGNOSIS — I739 Peripheral vascular disease, unspecified: Secondary | ICD-10-CM | POA: Diagnosis not present

## 2013-08-07 DIAGNOSIS — I2589 Other forms of chronic ischemic heart disease: Secondary | ICD-10-CM | POA: Insufficient documentation

## 2013-08-07 DIAGNOSIS — Z950 Presence of cardiac pacemaker: Secondary | ICD-10-CM | POA: Diagnosis not present

## 2013-08-07 DIAGNOSIS — R9439 Abnormal result of other cardiovascular function study: Secondary | ICD-10-CM | POA: Diagnosis not present

## 2013-08-07 DIAGNOSIS — Z5189 Encounter for other specified aftercare: Secondary | ICD-10-CM | POA: Diagnosis not present

## 2013-08-07 DIAGNOSIS — Z79899 Other long term (current) drug therapy: Secondary | ICD-10-CM | POA: Diagnosis not present

## 2013-08-07 DIAGNOSIS — Z9861 Coronary angioplasty status: Secondary | ICD-10-CM | POA: Insufficient documentation

## 2013-08-07 DIAGNOSIS — I25810 Atherosclerosis of coronary artery bypass graft(s) without angina pectoris: Secondary | ICD-10-CM | POA: Diagnosis not present

## 2013-08-10 ENCOUNTER — Encounter (HOSPITAL_COMMUNITY)
Admission: RE | Admit: 2013-08-10 | Discharge: 2013-08-10 | Disposition: A | Discharge status: Home / Self Care | Payer: Medicare Other | Source: Ambulatory Visit | Attending: Cardiovascular Disease | Admitting: Cardiovascular Disease

## 2013-08-10 ENCOUNTER — Encounter (HOSPITAL_COMMUNITY): Payer: Medicare Other

## 2013-08-12 ENCOUNTER — Encounter (HOSPITAL_COMMUNITY)
Admission: RE | Admit: 2013-08-12 | Discharge: 2013-08-12 | Disposition: A | Discharge status: Home / Self Care | Payer: Medicare Other | Source: Ambulatory Visit | Attending: Cardiovascular Disease | Admitting: Cardiovascular Disease

## 2013-08-12 ENCOUNTER — Encounter (HOSPITAL_COMMUNITY): Payer: Medicare Other

## 2013-08-13 ENCOUNTER — Other Ambulatory Visit (HOSPITAL_BASED_OUTPATIENT_CLINIC_OR_DEPARTMENT_OTHER): Payer: Medicare Other

## 2013-08-13 ENCOUNTER — Telehealth: Payer: Self-pay | Admitting: Oncology

## 2013-08-13 ENCOUNTER — Ambulatory Visit (HOSPITAL_BASED_OUTPATIENT_CLINIC_OR_DEPARTMENT_OTHER): Payer: Medicare Other | Admitting: Oncology

## 2013-08-13 VITALS — BP 121/65 | HR 59 | Temp 97.2°F | Resp 18 | Ht 69.75 in | Wt 207.0 lb

## 2013-08-13 DIAGNOSIS — T451X5A Adverse effect of antineoplastic and immunosuppressive drugs, initial encounter: Secondary | ICD-10-CM

## 2013-08-13 DIAGNOSIS — C8590 Non-Hodgkin lymphoma, unspecified, unspecified site: Secondary | ICD-10-CM

## 2013-08-13 DIAGNOSIS — C8589 Other specified types of non-Hodgkin lymphoma, extranodal and solid organ sites: Secondary | ICD-10-CM | POA: Diagnosis not present

## 2013-08-13 DIAGNOSIS — D6959 Other secondary thrombocytopenia: Secondary | ICD-10-CM

## 2013-08-13 DIAGNOSIS — I2510 Atherosclerotic heart disease of native coronary artery without angina pectoris: Secondary | ICD-10-CM

## 2013-08-13 LAB — CBC WITH DIFFERENTIAL/PLATELET
BASO%: 0.3 % (ref 0.0–2.0)
BASOS ABS: 0 10*3/uL (ref 0.0–0.1)
EOS ABS: 0.2 10*3/uL (ref 0.0–0.5)
EOS%: 6 % (ref 0.0–7.0)
HEMATOCRIT: 36.5 % — AB (ref 38.4–49.9)
HEMOGLOBIN: 12.8 g/dL — AB (ref 13.0–17.1)
LYMPH#: 0.6 10*3/uL — AB (ref 0.9–3.3)
LYMPH%: 14 % (ref 14.0–49.0)
MCH: 32 pg (ref 27.2–33.4)
MCHC: 35.1 g/dL (ref 32.0–36.0)
MCV: 91.3 fL (ref 79.3–98.0)
MONO#: 0.5 10*3/uL (ref 0.1–0.9)
MONO%: 12.5 % (ref 0.0–14.0)
NEUT%: 67.2 % (ref 39.0–75.0)
NEUTROS ABS: 2.7 10*3/uL (ref 1.5–6.5)
Platelets: 119 10*3/uL — ABNORMAL LOW (ref 140–400)
RBC: 4 10*6/uL — ABNORMAL LOW (ref 4.20–5.82)
RDW: 13.5 % (ref 11.0–14.6)
WBC: 4 10*3/uL (ref 4.0–10.3)
nRBC: 0 % (ref 0–0)

## 2013-08-13 NOTE — Progress Notes (Signed)
   David [name]    OFFICE PROGRESS NOTE   INTERVAL HISTORY:   He returns for scheduled followup of non-Hodgkin's lymphoma. He had a cardiac stent placed in July. He is in the [facility] program at [facility]. He has left lower leg claudication and will undergo an intervention procedure within the next few weeks. No fever, night sweats, or anorexia. No palpable lymph nodes.  Objective:  Vital signs in last 24 hours:  Blood pressure 121/65, pulse 59, temperature 97.2 F (36.2 C), temperature source Oral, resp. rate 18, height 5' 9.75" (1.772 m), weight 207 lb (93.895 kg).    HEENT: Neck without mass Lymphatics: No cervical, supraclavicular, or inguinal nodes. Prominent, left greater than right axillary fat pads versus a soft mobile 1-2 cm nodes. Resp: Lungs clear bilaterally Cardio: Regular rate and rhythm GI: No hepatosplenomegaly Vascular: No leg edema  Lab Results:  Lab Results  Component Value Date   WBC 4.0 08/13/2013   HGB 12.8* 08/13/2013   HCT 36.5* 08/13/2013   MCV 91.3 08/13/2013   PLT 119* 08/13/2013   NEUTROABS 2.7 08/13/2013      Medications: I have reviewed the patient's current medications.  Assessment/Plan: 1. Non-Hodgkin's lymphoma, follicular B-cell, low grade, involving a left cervical lymph node with a staging evaluation revealing evidence for advanced stage disease. There was cervical, mediastinal, and abdominal lymphadenopathy and a question of splenic involvement on staging scans. He completed 4 cycles of fludarabine/rituximab with resolution of the palpable lymphadenopathy. The last chemotherapy was given in June 2011.  2. Skin rash following cycle #1 of fludarabine/rituximab, likely related to allopurinol or Bactrim. The Bactrim prophylaxis was discontinued. 3. Chronic Thrombocytopenia secondary chemotherapy and non-Hodgkin's lymphoma, stable. 4. History of neutropenia secondary to chemotherapy. 5. Injection site reaction to a pneumococcal  vaccine in October 2011. 6. History of coronary artery disease and peripheral vascular disease, followed by cardiology  Disposition:  He remains in clinical remission from non-Hodgkin's lymphoma. He will return for an office and lab visit in 8 months.   Betsy Coder, MD  08/13/2013  6:16 PM

## 2013-08-13 NOTE — Telephone Encounter (Signed)
Gave pt appt for lab and MD for Septrember 2015

## 2013-08-14 ENCOUNTER — Encounter (HOSPITAL_COMMUNITY)
Admission: RE | Admit: 2013-08-14 | Discharge: 2013-08-14 | Disposition: A | Discharge status: Home / Self Care | Payer: Medicare Other | Source: Ambulatory Visit | Attending: Cardiovascular Disease | Admitting: Cardiovascular Disease

## 2013-08-14 ENCOUNTER — Encounter (HOSPITAL_COMMUNITY): Payer: Medicare Other

## 2013-08-17 ENCOUNTER — Encounter (HOSPITAL_COMMUNITY): Payer: Medicare Other

## 2013-08-17 ENCOUNTER — Encounter (HOSPITAL_COMMUNITY)
Admission: RE | Admit: 2013-08-17 | Discharge: 2013-08-17 | Disposition: A | Discharge status: Home / Self Care | Payer: Medicare Other | Source: Ambulatory Visit | Attending: Cardiovascular Disease | Admitting: Cardiovascular Disease

## 2013-08-19 ENCOUNTER — Encounter (HOSPITAL_COMMUNITY): Payer: Medicare Other

## 2013-08-19 ENCOUNTER — Encounter (HOSPITAL_COMMUNITY)
Admission: RE | Admit: 2013-08-19 | Discharge: 2013-08-19 | Disposition: A | Discharge status: Home / Self Care | Payer: Medicare Other | Source: Ambulatory Visit | Attending: Cardiovascular Disease | Admitting: Cardiovascular Disease

## 2013-08-20 ENCOUNTER — Encounter (HOSPITAL_COMMUNITY): Payer: Self-pay | Admitting: Pharmacy Technician

## 2013-08-21 ENCOUNTER — Encounter (HOSPITAL_COMMUNITY)
Admission: RE | Admit: 2013-08-21 | Discharge: 2013-08-21 | Disposition: A | Discharge status: Home / Self Care | Payer: Medicare Other | Source: Ambulatory Visit | Attending: Cardiovascular Disease | Admitting: Cardiovascular Disease

## 2013-08-21 ENCOUNTER — Encounter (HOSPITAL_COMMUNITY): Payer: Medicare Other

## 2013-08-24 ENCOUNTER — Encounter (HOSPITAL_COMMUNITY)
Admission: RE | Admit: 2013-08-24 | Discharge: 2013-08-24 | Disposition: A | Discharge status: Home / Self Care | Payer: Medicare Other | Source: Ambulatory Visit | Attending: Cardiovascular Disease | Admitting: Cardiovascular Disease

## 2013-08-24 ENCOUNTER — Encounter (HOSPITAL_COMMUNITY): Payer: Medicare Other

## 2013-08-25 DIAGNOSIS — R5381 Other malaise: Secondary | ICD-10-CM | POA: Diagnosis not present

## 2013-08-25 DIAGNOSIS — R5383 Other fatigue: Secondary | ICD-10-CM | POA: Diagnosis not present

## 2013-08-25 DIAGNOSIS — Z79899 Other long term (current) drug therapy: Secondary | ICD-10-CM | POA: Diagnosis not present

## 2013-08-25 DIAGNOSIS — D689 Coagulation defect, unspecified: Secondary | ICD-10-CM | POA: Diagnosis not present

## 2013-08-25 LAB — CBC
HCT: 39.4 % (ref 39.0–52.0)
Hemoglobin: 13.5 g/dL (ref 13.0–17.0)
MCH: 31.5 pg (ref 26.0–34.0)
MCHC: 34.3 g/dL (ref 30.0–36.0)
MCV: 91.8 fL (ref 78.0–100.0)
Platelets: 138 10*3/uL — ABNORMAL LOW (ref 150–400)
RBC: 4.29 MIL/uL (ref 4.22–5.81)
RDW: 14.4 % (ref 11.5–15.5)
WBC: 4.8 10*3/uL (ref 4.0–10.5)

## 2013-08-25 LAB — BASIC METABOLIC PANEL
BUN: 17 mg/dL (ref 6–23)
CO2: 29 mEq/L (ref 19–32)
Calcium: 8.8 mg/dL (ref 8.4–10.5)
Chloride: 105 mEq/L (ref 96–112)
Creat: 1.11 mg/dL (ref 0.50–1.35)
Glucose, Bld: 92 mg/dL (ref 70–99)
Potassium: 4.3 mEq/L (ref 3.5–5.3)
Sodium: 140 mEq/L (ref 135–145)

## 2013-08-25 LAB — PROTIME-INR
INR: 1.04 (ref <1.50)
Prothrombin Time: 13.5 seconds (ref 11.6–15.2)

## 2013-08-25 LAB — APTT: aPTT: 32 seconds (ref 24–37)

## 2013-08-26 ENCOUNTER — Encounter (HOSPITAL_COMMUNITY)
Admission: RE | Admit: 2013-08-26 | Discharge: 2013-08-26 | Disposition: A | Discharge status: Home / Self Care | Payer: Medicare Other | Source: Ambulatory Visit | Attending: Cardiovascular Disease | Admitting: Cardiovascular Disease

## 2013-08-26 ENCOUNTER — Encounter (HOSPITAL_COMMUNITY): Payer: Medicare Other

## 2013-08-26 LAB — TSH: TSH: 2.428 u[IU]/mL (ref 0.350–4.500)

## 2013-08-27 ENCOUNTER — Telehealth: Payer: Self-pay | Admitting: *Deleted

## 2013-08-27 NOTE — Telephone Encounter (Signed)
Cardiac rehab notified that Dr. Loletha Grayer review the strips that were faxed over and he is not changing any meds and he wants David [name] to continue exercising.  Voiced understanding.

## 2013-08-28 ENCOUNTER — Encounter (HOSPITAL_COMMUNITY)
Admission: RE | Admit: 2013-08-28 | Discharge: 2013-08-28 | Disposition: A | Discharge status: Home / Self Care | Payer: Medicare Other | Source: Ambulatory Visit | Attending: Cardiovascular Disease | Admitting: Cardiovascular Disease

## 2013-08-28 ENCOUNTER — Encounter (HOSPITAL_COMMUNITY): Payer: Medicare Other

## 2013-08-28 NOTE — Progress Notes (Signed)
Pt graduates today from the phase II cardiac rehab with the completion of 36 exercise sessions.  Pt has made excellent progress and will do well with home exercise.  Medication list reconciled.  Repeat PHQ2 score 0.  Pt commented that he was a bit scared at the beginning because of his heart.  Pt remarked that attending cardiac rehab gave him the confidence that he was ok and what symptoms he should report to his doctor.  Pt plans to continue home exercise with treadmill that he has at home and silver sneaker.  Cherre Huger, BSN

## 2013-08-31 ENCOUNTER — Encounter (HOSPITAL_COMMUNITY)
Admission: RE | Disposition: A | Discharge status: Home / Self Care | Payer: Self-pay | Source: Ambulatory Visit | Attending: Cardiovascular Disease

## 2013-08-31 ENCOUNTER — Encounter (HOSPITAL_COMMUNITY): Payer: Medicare Other

## 2013-08-31 ENCOUNTER — Ambulatory Visit (HOSPITAL_COMMUNITY)
Admission: RE | Admit: 2013-08-31 | Discharge: 2013-09-01 | Disposition: A | Discharge status: Home / Self Care | Payer: Medicare Other | Source: Ambulatory Visit | Attending: Cardiovascular Disease | Admitting: Cardiovascular Disease

## 2013-08-31 DIAGNOSIS — Z950 Presence of cardiac pacemaker: Secondary | ICD-10-CM | POA: Diagnosis not present

## 2013-08-31 DIAGNOSIS — I252 Old myocardial infarction: Secondary | ICD-10-CM | POA: Diagnosis not present

## 2013-08-31 DIAGNOSIS — Z9861 Coronary angioplasty status: Secondary | ICD-10-CM | POA: Diagnosis not present

## 2013-08-31 DIAGNOSIS — E785 Hyperlipidemia, unspecified: Secondary | ICD-10-CM | POA: Diagnosis not present

## 2013-08-31 DIAGNOSIS — C8581 Other specified types of non-Hodgkin lymphoma, lymph nodes of head, face, and neck: Secondary | ICD-10-CM | POA: Diagnosis not present

## 2013-08-31 DIAGNOSIS — I442 Atrioventricular block, complete: Secondary | ICD-10-CM | POA: Diagnosis not present

## 2013-08-31 DIAGNOSIS — Z951 Presence of aortocoronary bypass graft: Secondary | ICD-10-CM | POA: Diagnosis not present

## 2013-08-31 DIAGNOSIS — Z95820 Peripheral vascular angioplasty status with implants and grafts: Secondary | ICD-10-CM

## 2013-08-31 DIAGNOSIS — M19049 Primary osteoarthritis, unspecified hand: Secondary | ICD-10-CM | POA: Diagnosis not present

## 2013-08-31 DIAGNOSIS — I70219 Atherosclerosis of native arteries of extremities with intermittent claudication, unspecified extremity: Secondary | ICD-10-CM | POA: Diagnosis not present

## 2013-08-31 DIAGNOSIS — I7092 Chronic total occlusion of artery of the extremities: Secondary | ICD-10-CM | POA: Insufficient documentation

## 2013-08-31 DIAGNOSIS — I25810 Atherosclerosis of coronary artery bypass graft(s) without angina pectoris: Secondary | ICD-10-CM

## 2013-08-31 DIAGNOSIS — I10 Essential (primary) hypertension: Secondary | ICD-10-CM | POA: Diagnosis present

## 2013-08-31 DIAGNOSIS — I519 Heart disease, unspecified: Secondary | ICD-10-CM | POA: Insufficient documentation

## 2013-08-31 DIAGNOSIS — I2510 Atherosclerotic heart disease of native coronary artery without angina pectoris: Secondary | ICD-10-CM | POA: Diagnosis not present

## 2013-08-31 DIAGNOSIS — Z7982 Long term (current) use of aspirin: Secondary | ICD-10-CM | POA: Insufficient documentation

## 2013-08-31 DIAGNOSIS — Z01818 Encounter for other preprocedural examination: Secondary | ICD-10-CM

## 2013-08-31 DIAGNOSIS — Z9221 Personal history of antineoplastic chemotherapy: Secondary | ICD-10-CM | POA: Diagnosis not present

## 2013-08-31 DIAGNOSIS — Z87891 Personal history of nicotine dependence: Secondary | ICD-10-CM | POA: Diagnosis not present

## 2013-08-31 DIAGNOSIS — C8590 Non-Hodgkin lymphoma, unspecified, unspecified site: Secondary | ICD-10-CM | POA: Diagnosis present

## 2013-08-31 DIAGNOSIS — Z7902 Long term (current) use of antithrombotics/antiplatelets: Secondary | ICD-10-CM | POA: Insufficient documentation

## 2013-08-31 DIAGNOSIS — I6529 Occlusion and stenosis of unspecified carotid artery: Secondary | ICD-10-CM | POA: Insufficient documentation

## 2013-08-31 DIAGNOSIS — I2589 Other forms of chronic ischemic heart disease: Secondary | ICD-10-CM | POA: Diagnosis not present

## 2013-08-31 DIAGNOSIS — I739 Peripheral vascular disease, unspecified: Secondary | ICD-10-CM

## 2013-08-31 DIAGNOSIS — I255 Ischemic cardiomyopathy: Secondary | ICD-10-CM | POA: Diagnosis present

## 2013-08-31 HISTORY — PX: LOWER EXTREMITY ANGIOGRAM: SHX5508

## 2013-08-31 LAB — POCT ACTIVATED CLOTTING TIME
ACTIVATED CLOTTING TIME: 199 s
ACTIVATED CLOTTING TIME: 227 s
Activated Clotting Time: 155 seconds

## 2013-08-31 SURGERY — ANGIOGRAM, LOWER EXTREMITY
Anesthesia: LOCAL

## 2013-08-31 MED ORDER — ASPIRIN 81 MG PO CHEW
CHEWABLE_TABLET | ORAL | Status: AC
  Filled 2013-08-31: qty 1

## 2013-08-31 MED ORDER — CARVEDILOL 12.5 MG PO TABS
12.5000 mg | ORAL_TABLET | Freq: Two times a day (BID) | ORAL | Status: DC
  Administered 2013-08-31 – 2013-09-01 (×2): 12.5 mg via ORAL
  Filled 2013-08-31 (×4): qty 1

## 2013-08-31 MED ORDER — DIAZEPAM 5 MG PO TABS
ORAL_TABLET | ORAL | Status: AC
  Filled 2013-08-31: qty 1

## 2013-08-31 MED ORDER — HEPARIN (PORCINE) IN NACL 2-0.9 UNIT/ML-% IJ SOLN
INTRAMUSCULAR | Status: AC
  Filled 2013-08-31: qty 1000

## 2013-08-31 MED ORDER — HEPARIN SODIUM (PORCINE) 1000 UNIT/ML IJ SOLN
INTRAMUSCULAR | Status: AC
  Filled 2013-08-31: qty 1

## 2013-08-31 MED ORDER — LORATADINE 10 MG PO TABS
10.0000 mg | ORAL_TABLET | Freq: Every day | ORAL | Status: DC
  Filled 2013-08-31 (×2): qty 1

## 2013-08-31 MED ORDER — SODIUM CHLORIDE 0.9 % IJ SOLN: 3.0000 mL | INTRAMUSCULAR | Status: DC | PRN

## 2013-08-31 MED ORDER — NITROGLYCERIN 0.4 MG SL SUBL: 0.4000 mg | SUBLINGUAL_TABLET | SUBLINGUAL | Status: DC | PRN

## 2013-08-31 MED ORDER — CILOSTAZOL 100 MG PO TABS
100.0000 mg | ORAL_TABLET | Freq: Two times a day (BID) | ORAL | Status: DC
  Administered 2013-08-31: 17:00:00 100 mg via ORAL
  Filled 2013-08-31 (×3): qty 1

## 2013-08-31 MED ORDER — FENTANYL CITRATE 0.05 MG/ML IJ SOLN
INTRAMUSCULAR | Status: AC
  Filled 2013-08-31: qty 2

## 2013-08-31 MED ORDER — SODIUM CHLORIDE 0.9 % IV SOLN
INTRAVENOUS | Status: AC
  Administered 2013-08-31: 17:00:00 via INTRAVENOUS

## 2013-08-31 MED ORDER — SODIUM CHLORIDE 0.9 % IV SOLN
INTRAVENOUS | Status: DC
  Administered 2013-08-31: 10:00:00 via INTRAVENOUS

## 2013-08-31 MED ORDER — MIDAZOLAM HCL 2 MG/2ML IJ SOLN
INTRAMUSCULAR | Status: AC
  Filled 2013-08-31: qty 2

## 2013-08-31 MED ORDER — ACETAMINOPHEN 325 MG PO TABS: 650.0000 mg | ORAL_TABLET | ORAL | Status: DC | PRN

## 2013-08-31 MED ORDER — DIAZEPAM 5 MG PO TABS
5.0000 mg | ORAL_TABLET | ORAL | Status: AC
  Administered 2013-08-31: 5 mg via ORAL

## 2013-08-31 MED ORDER — CLOPIDOGREL BISULFATE 75 MG PO TABS
75.0000 mg | ORAL_TABLET | Freq: Every day | ORAL | Status: DC
  Administered 2013-08-31: 75 mg via ORAL
  Filled 2013-08-31: qty 1

## 2013-08-31 MED ORDER — ASPIRIN EC 81 MG PO TBEC
81.0000 mg | DELAYED_RELEASE_TABLET | Freq: Every day | ORAL | Status: DC
  Filled 2013-08-31 (×3): qty 1

## 2013-08-31 MED ORDER — MORPHINE SULFATE 2 MG/ML IJ SOLN
1.0000 mg | INTRAMUSCULAR | Status: DC | PRN
  Filled 2013-08-31: qty 1

## 2013-08-31 MED ORDER — PANTOPRAZOLE SODIUM 40 MG PO TBEC
40.0000 mg | DELAYED_RELEASE_TABLET | Freq: Every day | ORAL | Status: DC
  Administered 2013-08-31: 40 mg via ORAL
  Filled 2013-08-31: qty 1

## 2013-08-31 MED ORDER — ONDANSETRON HCL 4 MG/2ML IJ SOLN: 4.0000 mg | Freq: Four times a day (QID) | INTRAMUSCULAR | Status: DC | PRN

## 2013-08-31 MED ORDER — LISINOPRIL 20 MG PO TABS
20.0000 mg | ORAL_TABLET | Freq: Every day | ORAL | Status: DC
  Filled 2013-08-31 (×2): qty 1

## 2013-08-31 MED ORDER — LIDOCAINE HCL (PF) 1 % IJ SOLN
INTRAMUSCULAR | Status: AC
  Filled 2013-08-31: qty 30

## 2013-08-31 MED ORDER — HYDRALAZINE HCL 20 MG/ML IJ SOLN
10.0000 mg | Freq: Once | INTRAMUSCULAR | Status: AC
  Administered 2013-08-31: 16:00:00 10 mg via INTRAVENOUS

## 2013-08-31 MED ORDER — ACETAMINOPHEN 325 MG PO TABS
650.0000 mg | ORAL_TABLET | ORAL | Status: DC | PRN
  Administered 2013-09-01: 650 mg via ORAL
  Filled 2013-08-31: qty 2

## 2013-08-31 MED ORDER — ASPIRIN 81 MG PO CHEW: 81.0000 mg | CHEWABLE_TABLET | ORAL | Status: DC

## 2013-08-31 MED ORDER — HYDRALAZINE HCL 20 MG/ML IJ SOLN
INTRAMUSCULAR | Status: AC
  Administered 2013-08-31: 20:00:00 10 mg
  Filled 2013-08-31: qty 1

## 2013-08-31 NOTE — Progress Notes (Signed)
Site area: right groin  Site Prior to Removal:  Level 0  Pressure Applied For 20 MINUTES    Minutes Beginning at 1610  Manual:   yes  Patient Status During Pull:  stable  Post Pull Groin Site:  Level 0  Post Pull Instructions Given:  yes  Post Pull Pulses Present:  yes  Dressing Applied:  yes  Comments:

## 2013-08-31 NOTE — H&P (Signed)
  The patient presents today for abdominal aortography, bifemoral runoff potential endovascular treatment of chronic totally occluded left popliteal artery.  Pt was reexamined and existing H & P reviewed. No changes found.  Lorretta Harp, MD [facility] 08/31/2013 11:08 AM

## 2013-08-31 NOTE — CV Procedure (Signed)
David [name] is a 80 y.o. male    [id number] LOCATION:  FACILITY: [facility]  PHYSICIAN: David [name], M.D. [birth year]   DATE OF PROCEDURE:  08/31/2013  DATE OF DISCHARGE:     PV Angiogram/Intervention    History obtained from chart review.David [name] is a50 year old married Caucasian male father of 3 children is retired Tree surgeon for a company. He was initially patient of Dr. Rush Landmark [name]'s followed by Dr. Terance [name] and now Dr. Sallyanne [name]. He has a history of peripheral arterial disease with known moderate right internal carotid artery stenosis but with ultrasound as well as an occluded left popliteal artery by angiography back in 2009. He has geniculate collaterals with three-vessel runoff and lifestyle limiting claudication. His other problems include a history of hypertension and hyperlipidemia. He had coronary bypass grafting 09/10/88 and has had 3 stent procedures since most recently by Dr. Ellouise [name] on 03/04/13 which time he placed a drug-eluting stent in the obtuse marginal vein graft. He denies chest pain or shortness of breath. He is currently displaying chronic rehabilitation. Because of ongoing claudication left greater than right as well as advances in technology it was decided to attempt percutaneous transposition of the left popliteal chronic total occlusion.    PROCEDURE DESCRIPTION:   The patient was brought to the second floor [facility] cath lab in the postabsorptive state. He was  premedicated with Valium 5 mg by mouth, IV Versed and fentanyl. His right groin was prepped and shaved in usual sterile fashion. Xylocaine 1% was used for local anesthesia. A 5 French sheath was inserted into the right common femoral artery using standard Seldinger technique.the 5 French pigtail catheter was placed in the distal abdominal aorta. Abdominal aortography, bilateral iliac angiography and bifemoral runoff using posthaste digital subtraction step table technique was performed.  Visipaque dye was used for the entirety of the case. Retrograde aortic pressure was monitored during the case.   HEMODYNAMICS:    AO SYSTOLIC/AO DIASTOLIC: 086/57   Angiographic Data:   1: Abdominal aortogram-the distal abdominal aorta was free of significant disease  2: Left lower extremity-there is a heavily calcified fluoroscopically left popliteal artery chronic total occlusion beginning in the P1 segment and extending to the P3 segment. There was a 95% tibioperoneal trunk stenosis as well. The tibial vessels filled by geniculate collaterals.  3: Right lower extremity-there was a 95-99% calcified fairly focal popliteal artery stenosis (P2 segment) with three-vessel runoff.  IMPRESSION:moderately long-standing calcified chronic total occlusion left popliteal artery. Will proceed with attempts at crossing using the [place] catheter.  Procedure Description:contralateral access obtained with a 5 French crossover catheter, 035 Rosen wire, and 7 French/65 cm long destination sheath. A total of 6000 units of heparin was administered and ACT of 227. Total contrast administered the patient was 155 cc. I was able to get down to the proximal In the P3 segment with an 014 [place] core wire loaded in the Viance crossing catheter. I attempted for a significant time to cross the CTO with the crossing catheter unsuccessfully. I ultimately aborted the case.  Final Impression: unsuccessful attempt at crossing a chronic total occlusion in the left popliteal artery in the setting of lifestyle limiting claudication. The patient would be a candidate for above to below the knee left femoropopliteal bypass grafting. He is complaining of claudication in his right calf as well and I believe his popliteal lesion on the right is more easily revascularizable percutaneously. Because I used a 7 [place] sheath and he was  fully heparinized I plan to keep him overnight and discharged him home in the morning. The sheath  will be removed once the ACT falls below 170 pressure will be held on the groin to achieve hemostasis.    Lorretta Harp MD, [facility] 08/31/2013 1:04 PM

## 2013-09-01 ENCOUNTER — Encounter (HOSPITAL_COMMUNITY): Payer: Self-pay | Admitting: *Deleted

## 2013-09-01 DIAGNOSIS — I25810 Atherosclerosis of coronary artery bypass graft(s) without angina pectoris: Secondary | ICD-10-CM

## 2013-09-01 DIAGNOSIS — I739 Peripheral vascular disease, unspecified: Secondary | ICD-10-CM

## 2013-09-01 LAB — CBC
HCT: 33.1 % — ABNORMAL LOW (ref 39.0–52.0)
Hemoglobin: 11.6 g/dL — ABNORMAL LOW (ref 13.0–17.0)
MCH: 31.9 pg (ref 26.0–34.0)
MCHC: 35 g/dL (ref 30.0–36.0)
MCV: 90.9 fL (ref 78.0–100.0)
PLATELETS: 113 10*3/uL — AB (ref 150–400)
RBC: 3.64 MIL/uL — AB (ref 4.22–5.81)
RDW: 13.2 % (ref 11.5–15.5)
WBC: 5.3 10*3/uL (ref 4.0–10.5)

## 2013-09-01 LAB — BASIC METABOLIC PANEL
BUN: 16 mg/dL (ref 6–23)
CALCIUM: 8.2 mg/dL — AB (ref 8.4–10.5)
CO2: 23 meq/L (ref 19–32)
CREATININE: 1.16 mg/dL (ref 0.50–1.35)
Chloride: 102 mEq/L (ref 96–112)
GFR calc non Af Amer: 58 mL/min — ABNORMAL LOW (ref >90)
GFR, EST AFRICAN AMERICAN: 68 mL/min — AB (ref >90)
Glucose, Bld: 110 mg/dL — ABNORMAL HIGH (ref 70–99)
Potassium: 4.3 mEq/L (ref 3.7–5.3)
SODIUM: 138 meq/L (ref 137–147)

## 2013-09-01 NOTE — H&P (Signed)
Patient ID: David [name] MRN: [id number], DOB/AGE: [birth year]   Admit date: 08/31/2013   Primary Physician: Mayra Neer, MD Primary Cardiologist: Dr Gwenlyn Found Dr Croitoru  HPI: David [name] is a80 year old married Caucasian male father of 3 children is retired Tree surgeon for a company. He was initially patient of Dr. Merri Ray, then followed by Dr. Terance Ice and now Dr. [name]. He has a history of peripheral arterial disease with known moderate right internal carotid artery stenosis but with ultrasound, as well as an occluded left popliteal artery by angiography back in 2009. He has geniculate collaterals with three-vessel runoff and lifestyle limiting claudication. Because of ongoing claudication, left greater than right, as well as advances in technology it was decided to attempt percutaneous transposition of the left popliteal chronic total occlusion.            His other problems include a history of hypertension and hyperlipidemia. He had coronary bypass grafting 09/10/88 and has had 3 stent procedures since, most recently by Dr. Ellouise Newer 03/04/13 at which time he had a DES placed to the obtuse marginal vein graft. Myoview in Sept 2014 was low risk. He denies chest pain or shortness of breath.     Problem List: Past Medical History  Diagnosis Date  . CAD (coronary artery disease)   . Hypertension   . Peripheral vascular disease with claudication   . Hyperlipidemia   . Left ventricular dysfunction   . Complete heart block   . Pacemaker   . Anginal pain   . Myocardial infarction 1989  . Arthritis     "hands" (03/04/2013)  . Non Hodgkin's lymphoma 06/2010    "excised from left neck then tx'd w/chemo" (03/04/2013)  . Carotid artery disease     Past Surgical History  Procedure Laterality Date  . Anal fissure repair  1990's  . 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 Cardiac catheterization  1989;   . Coronary angioplasty with stent placement  2007;  03/04/2013    ""2 +1"  . Coronary artery bypass graft  09/10/1988    "CABG X5" (03/04/2013) LIMA to LAD,SVG to imtermediate OM1 OM II,SVG to PDA  . Insert / replace / remove pacemaker  1995; 2002; 2009    [employer]  . Superficial lymph node biopsy / excision Left 2011    "neck" (03/04/2013     Allergies:  Allergies  Allergen Reactions  . Azithromycin Diarrhea  . Lactose Intolerance (Gi)   . Niacin And Related Diarrhea  . Sulfa Antibiotics Rash     Home Medications Current Facility-Administered Medications  Medication Dose Route Frequency Provider Last Rate Last Dose  . acetaminophen (TYLENOL) tablet 650 mg  650 mg Oral Q4H PRN Lorretta Harp, MD   650 mg at 09/01/13 0525  . aspirin EC tablet 81 mg  81 mg Oral QHS Lorretta Harp, MD      . carvedilol (COREG) tablet 12.5 mg  12.5 mg Oral BID WC Lorretta Harp, MD   12.5 mg at 09/01/13 0109  . cilostazol (PLETAL) tablet 100 mg  100 mg Oral BID Lorretta Harp, MD   100 mg at 08/31/13 1721  . clopidogrel (PLAVIX) tablet 75 mg  75 mg Oral Daily Lorretta Harp, MD   75 mg at 08/31/13 1720  . lisinopril (PRINIVIL,ZESTRIL) tablet 20 mg  20 mg Oral Daily Lorretta Harp, MD      . loratadine (CLARITIN) tablet 10 mg  10 mg  Oral Daily Lorretta Harp, MD      . morphine 2 MG/ML injection 1 mg  1 mg Intravenous Q1H PRN Lorretta Harp, MD      . nitroGLYCERIN (NITROSTAT) SL tablet 0.4 mg  0.4 mg Sublingual Q5 min PRN Lorretta Harp, MD      . ondansetron [facility]) injection 4 mg  4 mg Intravenous Q6H PRN Lorretta Harp, MD      . pantoprazole (PROTONIX) EC tablet 40 mg  40 mg Oral Daily Lorretta Harp, MD   40 mg at 08/31/13 1721     Family History  Problem Relation Age of Onset  . Diabetes Mother   . Stroke Mother   . Heart attack Father   . Hypertension Sister   . Hyperlipidemia Sister   . Hypertension Sister   . Hyperlipidemia Sister      History   Social History  . Marital Status: Married    Spouse  Name: N/A    Number of Children: N/A  . Years of Education: N/A   Occupational History  . Not on file.   Social History Main Topics  . Smoking status: Former Smoker -- 0.12 packs/day for 1 years    Types: Cigarettes    Quit date: 08/06/1956  . Smokeless tobacco: Never Used  . Alcohol Use: 2.4 oz/week    4 Glasses of wine per week  . Drug Use: No  . Sexual Activity: No   Other Topics Concern  . Not on file   Social History Narrative  . No narrative on file     Review of Systems: General: negative for chills, fever, night sweats or weight changes.  Cardiovascular: negative for chest pain, dyspnea on exertion, edema, orthopnea, palpitations, paroxysmal nocturnal dyspnea or shortness of breath Dermatological: negative for rash Respiratory: negative for cough or wheezing Urologic: negative for hematuria Abdominal: negative for nausea, vomiting, diarrhea, bright red blood per rectum, melena, or hematemesis Neurologic: negative for visual changes, syncope, or dizziness All other systems reviewed and are otherwise negative except as noted above.  Physical Exam: Blood pressure 116/60, pulse 61, temperature 97.4 F (36.3 C), temperature source Oral, resp. rate 18, height 5\' 10"  (1.778 m), weight 206 lb 9.1 oz (93.7 kg), SpO2 94.00%.  General appearance: alert, cooperative and no distress Neck: no JVD Lungs: clear to auscultation bilaterally Heart: regular rate and rhythm Abdomen: soft, non-tender; bowel sounds normal; no masses,  no organomegaly Extremities: no edema Pulses: diminnished Skin: Skin color, texture, turgor normal. No rashes or lesions Neurologic: Grossly normal    Labs:   Results for orders placed during the hospital encounter of 08/31/13 (from the past 24 hour(s))  POCT ACTIVATED CLOTTING TIME     Status: None   Collection Time    08/31/13 12:17 PM      Result Value Range   Activated Clotting Time 199    POCT ACTIVATED CLOTTING TIME     Status: None    Collection Time    08/31/13 12:35 PM      Result Value Range   Activated Clotting Time 227    POCT ACTIVATED CLOTTING TIME     Status: None   Collection Time    08/31/13  2:43 PM      Result Value Range   Activated Clotting Time 155    CBC     Status: Abnormal   Collection Time    09/01/13  7:25 AM      Result Value Range  WBC 5.3  4.0 - 10.5 K/uL   RBC 3.64 (*) 4.22 - 5.81 MIL/uL   Hemoglobin 11.6 (*) 13.0 - 17.0 g/dL   HCT 33.1 (*) 39.0 - 52.0 %   MCV 90.9  78.0 - 100.0 fL   MCH 31.9  26.0 - 34.0 pg   MCHC 35.0  30.0 - 36.0 g/dL   RDW 13.2  11.5 - 15.5 %   Platelets 113 (*) 150 - 400 K/uL  BASIC METABOLIC PANEL     Status: Abnormal   Collection Time    09/01/13  7:25 AM      Result Value Range   Sodium 138  137 - 147 mEq/L   Potassium 4.3  3.7 - 5.3 mEq/L   Chloride 102  96 - 112 mEq/L   CO2 23  19 - 32 mEq/L   Glucose, Bld 110 (*) 70 - 99 mg/dL   BUN 16  6 - 23 mg/dL   Creatinine, Ser 1.16  0.50 - 1.35 mg/dL   Calcium 8.2 (*) 8.4 - 10.5 mg/dL   GFR calc non Af Amer 58 (*) >90 mL/min   GFR calc Af Amer 68 (*) >90 mL/min     Radiology/Studies: No results found.  EKG: Paced  ASSESSMENT AND PLAN:  Active Problems:   Claudication- unsuccessful PTA attempt Lt popliteal 08/31/13   CAD s/p CABG 1995, s/p stent SVG-OM 2007 and July 2014   S/P angioplasty with stent SVG-OM 7/30./14   PVD - Lt popliteal occlusion with collaterals '09   Cardiomyopathy, ischemic- EF 40-45% by echo 4/14   Complete heart block- PTVDP '95 with Gen change '02 and '09 (BS). Pacer dependednt   Non Hodgkin's lymphoma- Feb 2011- radiation Rx   Dyslipidemia   HTN (hypertension)   PLAN:  Admit for elective PV angiogram.   Signed, Erlene Quan, PA-C 09/01/2013, 10:20 AM

## 2013-09-01 NOTE — Progress Notes (Addendum)
Subjective:  No complaints  Objective:  Vital Signs in the last 24 hours: Temp:  [97.4 F (36.3 C)-98 F (36.7 C)] 97.4 F (36.3 C) (01/27 0752) Pulse Rate:  [59-70] 61 (01/27 0752) Resp:  [16-20] 18 (01/27 0752) BP: (96-189)/(41-71) 116/60 mmHg (01/27 0752) SpO2:  [94 %-100 %] 94 % (01/27 0752) Weight:  [206 lb 9.1 oz (93.7 kg)] 206 lb 9.1 oz (93.7 kg) (01/27 0048)  Intake/Output from previous day:  Intake/Output Summary (Last 24 hours) at 09/01/13 0958 Last data filed at 09/01/13 9326  Gross per 24 hour  Intake   1095 ml  Output    600 ml  Net    495 ml    Physical Exam: General appearance: alert, cooperative and no distress No JVD Lungs: clear to auscultation bilaterally Heart: regular rate and rhythm Abd: soft: BS + Extremities: Rt groin without hematoma   Rate: 62  Rhythm: normal sinus rhythm  Lab Results:  Recent Labs  09/01/13 0725  WBC 5.3  HGB 11.6*  PLT 113*    Recent Labs  09/01/13 0725  NA 138  K 4.3  CL 102  CO2 23  GLUCOSE 110*  BUN 16  CREATININE 1.16   No results found for this basename: TROPONINI, CK, MB,  in the last 72 hours No results found for this basename: INR,  in the last 72 hours  Imaging: Imaging results have been reviewed  Cardiac Studies:  Assessment/Plan:   Active Problems:   CAD s/p CABG 1995, s/p stent SVG-OM 2007 and July 2014   S/P angioplasty with stent SVG-OM 7/30./14   Complete heart block- PTVDP '95 with Gen change '02 and '09 (BS). Pacer dependednt   PVD - Lt popliteal occlusion with collaterals '09   Non Hodgkin's lymphoma- Feb 2011- radiation Rx   Dyslipidemia   HTN (hypertension)   Cardiomyopathy, ischemic- EF 40-45% by echo 4/14   Claudication- unsuccessful PTA attempt Lt popliteal 08/31/13    PLAN: Follow up with Dr Gwenlyn Found.   Kerin Ransom PA-C [facility] [phone] 09/01/2013, 9:58 AM   Patient seen and examined. Agree with assessment and plan. No chest pain/ s/p PCI to svg-om in July. Attempted  PCI to chronically occluded popliteal artery since 2009. Pt has been hydrated overnight. Cr 1.16.  DC today with f/u with Dr. Gwenlyn Found to arrange for subsequent potential fem-pop surgery.   Troy Sine, MD, [facility] 09/01/2013 9:58 AM

## 2013-09-01 NOTE — Discharge Summary (Signed)
Patient ID: David [name],  MRN: [id number], DOB/AGE: Jan 02, 1933 80 y.o.  Admit date: 08/31/2013 Discharge date: 09/01/2013  Primary Care Provider: Dr Serita Grammes Primary Cardiologist: Dr Gwenlyn Found Dr Croitoru  Discharge Diagnoses Active Problems:   Claudication- unsuccessful PTA attempt Lt popliteal 08/31/13   CAD s/p CABG 1995, s/p stent SVG-OM 2007 and July 2014   S/P angioplasty with stent SVG-OM 7/30./14   PVD - Lt popliteal occlusion with collaterals '09   Cardiomyopathy, ischemic- EF 40-45% by echo 4/14   Complete heart block- PTVDP '95 with Gen change '02 and '09 (BS). 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 He was initially patient of Dr. Merri Ray, then  followed by Dr. Terance Ice and now Dr. [name]. He has a history of peripheral arterial disease with known moderate right internal carotid artery stenosis but with ultrasound, as well as an occluded left popliteal artery by angiography back in 2009. He has geniculate collaterals with three-vessel runoff and lifestyle limiting claudication. Because of ongoing claudication, left greater than right, as well as advances in technology it was decided to attempt percutaneous transposition of the left popliteal chronic total occlusion.                 His other problems include a history of hypertension and hyperlipidemia. He had coronary bypass grafting 09/10/88 and has had 3 stent procedures since, most recently by Dr. Ellouise Newer 03/04/13 at which time he had a DES placed to the obtuse marginal vein graft. Myoview in Sept 2014 was low risk. He denies chest pain or shortness of breath.                 On 08/31/13 he underwent PV angiogram with an unsuccessful attempt at crossing a chronic total  occlusion in the left popliteal artery. The patient would be a candidate for above to below the knee left femoropopliteal bypass grafting. He is complaining of claudication in his right calf as well and Dr Gwenlyn Found believes his popliteal lesion on the right is more easily revascularizable percutaneously. He was kept overnight and felt to be stable for discharge 09/01/13. He'll follow up with Dr Gwenlyn Found in 1-2 weeks.     Discharge Vitals:  Blood pressure 116/60, pulse 61, temperature 97.4 F (36.3 C), temperature source Oral, resp. rate 18, height 5' 10" (1.778 m), weight 206 lb 9.1 oz (93.7 kg), SpO2 94.00%.    Labs: Results for orders placed during the hospital encounter of 08/31/13 (from the past 48 hour(s))  POCT ACTIVATED CLOTTING TIME     Status: None   Collection Time    08/31/13 12:17 PM      Result Value Range   Activated Clotting Time 199    POCT ACTIVATED CLOTTING TIME     Status: None   Collection Time    08/31/13 12:35 PM      Result Value Range   Activated Clotting Time 227    POCT ACTIVATED CLOTTING TIME     Status: None   Collection Time    08/31/13  2:43 PM      Result Value Range   Activated Clotting Time 155    CBC     Status: Abnormal   Collection Time    09/01/13  7:25 AM      Result Value Range   WBC 5.3  4.0 - 10.5 K/uL   RBC 3.64 (*) 4.22 - 5.81 MIL/uL   Hemoglobin 11.6 (*) 13.0 - 17.0 g/dL   HCT 33.1 (*) 39.0 - 52.0 %   MCV 90.9  78.0 - 100.0 fL   MCH 31.9  26.0 - 34.0 pg   MCHC 35.0  30.0 - 36.0 g/dL   RDW 13.2  11.5 - 15.5 %   Platelets 113 (*) 150 - 400 K/uL   Comment: CONSISTENT WITH PREVIOUS RESULT  BASIC METABOLIC PANEL     Status: Abnormal   Collection Time    09/01/13  7:25 AM      Result Value Range   Sodium 138  137 - 147 mEq/L   Potassium 4.3  3.7 - 5.3 mEq/L   Chloride 102  96 - 112 mEq/L   CO2 23  19 - 32 mEq/L   Glucose, Bld 110 (*) 70 - 99 mg/dL   BUN 16  6 - 23 mg/dL   Creatinine, Ser 1.16  0.50 - 1.35 mg/dL   Calcium 8.2 (*) 8.4 -  10.5 mg/dL   GFR calc non Af Amer 58 (*) >90 mL/min   GFR calc Af Amer 68 (*) >90 mL/min   Comment: (NOTE)     The eGFR has been calculated using the CKD EPI equation.     This calculation has not been validated in all clinical situations.     eGFR's persistently <90 mL/min signify possible Chronic Kidney     Disease.    Disposition: Discharged in stable condition   Discharge Medications:    Medication List    ASK your doctor about these medications       acetaminophen 325 MG tablet  Commonly known as:  TYLENOL  Take 2 tablets (650 mg total) by mouth every 4 (four) hours as needed for pain or fever.     aspirin EC 81 MG tablet  Take 81 mg by mouth at bedtime.     bimatoprost 0.01 % Soln  Commonly known as:  LUMIGAN  Place 1 drop into both eyes at bedtime.     carvedilol 12.5 MG tablet  Commonly known as:  COREG  Take 12.5 mg by mouth 2 (two) times daily with a meal.     cetirizine 10 MG tablet  Commonly known as:  ZYRTEC  Take 10 mg by mouth daily as needed for allergies.     cilostazol 100 MG tablet  Commonly known as:  PLETAL  Take 100 mg by mouth 2 (two) times daily.     clopidogrel 75 MG tablet  Commonly known as:  PLAVIX  Take 1 tablet (75 mg total) by mouth daily.     Glucosamine-Chondroitin 750-600 MG Tabs  Take 1 tablet by mouth 2 (two) times daily.     lisinopril 20 MG tablet  Commonly known as:  PRINIVIL,ZESTRIL  Take 20 mg by mouth daily.     nitroGLYCERIN 0.4 MG SL tablet  Commonly known as:  NITROSTAT  Place 1 tablet (0.4 mg total) under the tongue every 5 (five) minutes as needed for chest pain.     omega-3 acid ethyl esters 1 G capsule  Commonly known as:  LOVAZA  Take 2 g by mouth 2 (two) times daily.     pantoprazole 40 MG tablet  Commonly known as:  PROTONIX  Take 1 tablet (40 mg total) by mouth daily.  rosuvastatin 40 MG tablet  Commonly known as:  CRESTOR  Take 1 tablet (40 mg total) by mouth at bedtime.     SENIOR  MULTIVITAMIN PLUS PO  Take 1 tablet by mouth daily.         Duration of Discharge Encounter: Greater than 30 minutes including physician time.  Angelena Form PA-C 09/01/2013 9:55 AM

## 2013-09-02 ENCOUNTER — Encounter (HOSPITAL_COMMUNITY): Payer: Medicare Other

## 2013-09-04 ENCOUNTER — Encounter (HOSPITAL_COMMUNITY): Payer: Medicare Other

## 2013-09-08 ENCOUNTER — Telehealth: Payer: Self-pay | Admitting: Cardiovascular Disease

## 2013-09-08 NOTE — Telephone Encounter (Signed)
Returned call and pt verified x 2.  Pt c/o small lump in groin area that is tender to touch.  Stated he was calling b/c of the directions he was given and wasn't sure if he needed to be seen.  Pt informed he is scheduled for next week, but this does warrant a sooner appt.  Next availalble on 2.5.15 at 10:30am w/ Lyda Jester, PA-C.  Pt advised to call back for sooner appt if increased swelling, tenderness, hardness at site.  Pt verbalized understanding and agreed w/ plan.

## 2013-09-08 NOTE — Telephone Encounter (Signed)
He had an angiogram last week,he has a hard lump in the groin area, It is only sore when he touch it,wonders if he need to be seen?

## 2013-09-10 ENCOUNTER — Ambulatory Visit (INDEPENDENT_AMBULATORY_CARE_PROVIDER_SITE_OTHER): Payer: Medicare Other | Admitting: Cardiology

## 2013-09-10 VITALS — BP 112/70 | HR 62 | Ht 70.0 in | Wt 207.6 lb

## 2013-09-10 DIAGNOSIS — I739 Peripheral vascular disease, unspecified: Secondary | ICD-10-CM

## 2013-09-10 DIAGNOSIS — IMO0002 Reserved for concepts with insufficient information to code with codable children: Secondary | ICD-10-CM

## 2013-09-10 DIAGNOSIS — I97410 Intraoperative hemorrhage and hematoma of a circulatory system organ or structure complicating a cardiac catheterization: Secondary | ICD-10-CM

## 2013-09-10 DIAGNOSIS — S301XXA Contusion of abdominal wall, initial encounter: Secondary | ICD-10-CM

## 2013-09-10 NOTE — Patient Instructions (Signed)
Refrain from heavy lifting  Call our office if you notice any increased swelling, bruising of develop severe groin or low back pain Follow-up with Dr. Gwenlyn Found in March

## 2013-09-11 ENCOUNTER — Encounter: Payer: Self-pay | Admitting: Cardiology

## 2013-09-11 DIAGNOSIS — S301XXA Contusion of abdominal wall, initial encounter: Secondary | ICD-10-CM | POA: Insufficient documentation

## 2013-09-11 NOTE — Assessment & Plan Note (Signed)
Stable. + ecchymosis but surrounding area is moderately soft. Non tender. No bruit. BP stable.

## 2013-09-11 NOTE — Assessment & Plan Note (Addendum)
He continues to have bilateral claudication, however it has improved slightly with cilostazol. Right femoral access site continues to have small but stable hematoma. No pain. No bruit.

## 2013-09-11 NOTE — Progress Notes (Signed)
Patient ID: David [name], male   DOB: October 07, 1932, 80 y.o.   MRN: [id number] 09/11/2013 David [name]   July 18, 1933  [id number]  Primary Physicia SHAW,KIMBERLEE, MD Primary Cardiologist: Dr. [name]  HPI:  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 He was initially patient of Dr. Merri Ray, then followed by Dr. Terance Ice and now Dr. [name]. He has a history of peripheral arterial disease with known moderate right internal carotid artery stenosis by ultrasound, as well as an occluded left popliteal artery by angiography back in 2009. He has geniculate collaterals with three-vessel runoff and lifestyle limiting claudication. Because of ongoing claudication, left greater than right, as well as advances in technology it was decided to attempt percutaneous transposition of the left popliteal chronic total occlusion.  His other problems include a history of hypertension and hyperlipidemia. He had coronary bypass grafting 09/10/88 and has had 3 stent procedures since, most recently by Dr. Ellouise Newer 03/04/13 at which time he had a DES placed to the obtuse marginal vein graft. Myoview in Sept 2014 was low risk. He denies chest pain or shortness of breath.   On 08/31/13 he underwent PV angiogram with an unsuccessful attempt at crossing a chronic total occlusion in the left popliteal artery. He returns to clinic today for post-hospital f/u. He continues to have bilateral lower extremity claudication, but this has improved some with the addition of cilostazol. He continues to have a small groin hematoma and mild ecchymosis, but he denies anterior groin, flank and back pain. He denies dizziness, SOB, syncope/ near syncope.    Current Outpatient Prescriptions  Medication Sig Dispense Refill  . acetaminophen (TYLENOL) 325 MG tablet Take 2 tablets (650 mg total) by mouth every 4 (four) hours as needed for pain or fever.      Marland Kitchen aspirin EC 81 MG  tablet Take 81 mg by mouth at bedtime.      . bimatoprost (LUMIGAN) 0.01 % SOLN Place 1 drop into both eyes at bedtime.       . carvedilol (COREG) 12.5 MG tablet Take 12.5 mg by mouth 2 (two) times daily with a meal.       . cetirizine (ZYRTEC) 10 MG tablet Take 10 mg by mouth daily as needed for allergies.      . cilostazol (PLETAL) 100 MG tablet Take 100 mg by mouth 2 (two) times daily.      . clopidogrel (PLAVIX) 75 MG tablet Take 1 tablet (75 mg total) by mouth daily.  90 tablet  3  . Glucosamine-Chondroitin 750-600 MG TABS Take 1 tablet by mouth 2 (two) times daily.      Marland Kitchen lisinopril (PRINIVIL,ZESTRIL) 20 MG tablet Take 20 mg by mouth daily.      . Multiple Vitamins-Minerals (SENIOR MULTIVITAMIN PLUS PO) Take 1 tablet by mouth daily.      . nitroGLYCERIN (NITROSTAT) 0.4 MG SL tablet Place 1 tablet (0.4 mg total) under the tongue every 5 (five) minutes as needed for chest pain.  25 tablet  2  . omega-3 acid ethyl esters (LOVAZA) 1 G capsule Take 2 g by mouth 2 (two) times daily.       . pantoprazole (PROTONIX) 40 MG tablet Take 1 tablet (40 mg total) by mouth daily.  90 tablet  3  . rosuvastatin (CRESTOR) 40 MG tablet Take 1 tablet (40 mg total) by mouth at bedtime.       No current  facility-administered medications for this visit.    Allergies  Allergen Reactions  . Azithromycin Diarrhea  . Lactose Intolerance (Gi)   . Niacin And Related Diarrhea  . Sulfa Antibiotics Rash    History   Social History  . Marital Status: Married    Spouse Name: N/A    Number of Children: N/A  . Years of Education: N/A   Occupational History  . Not on file.   Social History Main Topics  . Smoking status: Former Smoker -- 0.12 packs/day for 1 years    Types: Cigarettes    Quit date: 08/06/1956  . Smokeless tobacco: Never Used  . Alcohol Use: 2.4 oz/week    4 Glasses of wine per week  . Drug Use: No  . Sexual Activity: No   Other Topics Concern  . Not on file   Social History Narrative   . No narrative on file     Review of Systems: General: negative for chills, fever, night sweats or weight changes.  Cardiovascular: negative for chest pain, dyspnea on exertion, edema, orthopnea, palpitations, paroxysmal nocturnal dyspnea or shortness of breath Dermatological: negative for rash Respiratory: negative for cough or wheezing Urologic: negative for hematuria Abdominal: negative for nausea, vomiting, diarrhea, bright red blood per rectum, melena, or hematemesis Neurologic: negative for visual changes, syncope, or dizziness All other systems reviewed and are otherwise negative except as noted above.    Blood pressure 112/70, pulse 62, height 5\' 10"  (1.778 m), weight 207 lb 9.6 oz (94.167 kg).  Physical Exam  Constitutional: He is well-developed, well-nourished, and in no distress. No distress.  Neck: No JVD present. Carotid bruit is not present.  Cardiovascular: Normal rate, regular rhythm, normal heart sounds and intact distal pulses.  Exam reveals no gallop.   No murmur heard. Pulses:      Radial pulses are 2+ on the right side, and 2+ on the left side.       Dorsalis pedis pulses are 1+ on the right side, and 1+ on the left side.  Pulmonary/Chest: Effort normal and breath sounds normal. No respiratory distress. He has no wheezes. He has no rales.  Musculoskeletal: He exhibits no edema.  Neurological: He is alert.  Skin: Skin is warm and dry. He is not diaphoretic.  Psychiatric: Mood, memory, affect and judgment normal.     ASSESSMENT AND PLAN:   Claudication- unsuccessful PTA attempt Lt popliteal 08/31/13 He continues to have bilateral claudication, however it has improved slightly with cilostazol. Right femoral access site continues to have small but stable hematoma. No pain. No bruit.   Hematoma of groin -right sided; s/p PV angio Stable. + ecchymosis but surrounding area is moderately soft. Non tender. No bruit. BP stable.     PLAN  Pt returns for  post-hospital f/u after undergoing a PV angio. Attempt to intervene  on the left popliteal was unsuccessful. The patient may be a candidate for above to below the knee left femoropopliteal bypass grafting. We will have him keep his f/u appointment with Dr. Gwenlyn Found in a few weeks to discuss options. I reassured him that his right groin appears stable and informed him that it will take several weeks for hematoma and ecchymosis to completely resolve. I did instruct him to continue to refrain from heavy lifting for the next week, as an extra precaution.    SIMMONS, BRITTAINYPA-C 09/11/2013 4:09 PM

## 2013-09-15 NOTE — H&P (Signed)
Lorretta Harp, M.D., [place], [facility], [place] [facility] [address] [place], [place]  [id number]  [phone] 09/15/2013 2:44 PM

## 2013-09-17 ENCOUNTER — Ambulatory Visit: Payer: Medicare Other | Admitting: Cardiology

## 2013-09-17 ENCOUNTER — Telehealth: Payer: Self-pay | Admitting: Cardiovascular Disease

## 2013-09-17 NOTE — Telephone Encounter (Signed)
Does not think the pacemaker is not working right.. Heartbeat slowing down likes he does not have a pacemaker , then it will come back .Marland Kitchen Please Call

## 2013-09-17 NOTE — Telephone Encounter (Signed)
Appointment made for 09-18-13 @ 11:15 with [address] [facility]. Patient voiced understanding. I advised patient to call 911 if sxms develop before appointment.

## 2013-09-18 ENCOUNTER — Ambulatory Visit (INDEPENDENT_AMBULATORY_CARE_PROVIDER_SITE_OTHER): Payer: Medicare Other | Admitting: *Deleted

## 2013-09-18 DIAGNOSIS — Z950 Presence of cardiac pacemaker: Secondary | ICD-10-CM

## 2013-09-18 LAB — MDC_IDC_ENUM_SESS_TYPE_INCLINIC
Brady Statistic RA Percent Paced: 96 %
Brady Statistic RV Percent Paced: 100 %
Date Time Interrogation Session: 20150213050000
Implantable Pulse Generator Serial Number: 594681
Lead Channel Impedance Value: 470 Ohm
Lead Channel Pacing Threshold Amplitude: 0.8 V
Lead Channel Pacing Threshold Amplitude: 1.6 V
Lead Channel Pacing Threshold Pulse Width: 0.4 ms
Lead Channel Pacing Threshold Pulse Width: 0.8 ms
Lead Channel Setting Pacing Amplitude: 3.2 V
Lead Channel Setting Pacing Pulse Width: 0.4 ms
MDC IDC MSMT LEADCHNL RV IMPEDANCE VALUE: 640 Ohm
MDC IDC SET LEADCHNL RV PACING AMPLITUDE: 2.4 V
MDC IDC SET LEADCHNL RV SENSING SENSITIVITY: 3.5 mV

## 2013-09-18 NOTE — Progress Notes (Signed)
Pt c/o of pulse rate in the 40s with intermittent accompanying head aches.  Presenting rhythm is Ap/Vp w/ bigeminal PVCs. PVC count 76.5K out of 20.5+ million since 02/05/2013.  Pacemaker check in clinic. Normal device function. Thresholds, sensing, impedances consistent with previous measurements. Device programmed to maximize longevity. 1.1K mode switches, longest 0.39min, avg 0.54min---noise--decreased Atrial sensitivity from 3.0 to 4.57mV. No high ventricular rates noted. Device programmed at appropriate safety margins---changed Atrial pulse width from 0.6 to 0.82ms, Atrial output from 2.8 to 3.2V. Histogram distribution appropriate for patient activity level. Device programmed to optimize intrinsic conduction. Estimated longevity 2.2yrs.   ROV w/ Dr. Loletha Grayer 12/18/13 @ 9:00

## 2013-10-13 ENCOUNTER — Encounter: Payer: Self-pay | Admitting: Cardiovascular Disease

## 2013-10-13 ENCOUNTER — Ambulatory Visit (INDEPENDENT_AMBULATORY_CARE_PROVIDER_SITE_OTHER): Payer: Medicare Other | Admitting: Cardiovascular Disease

## 2013-10-13 ENCOUNTER — Encounter: Payer: Self-pay | Admitting: *Deleted

## 2013-10-13 VITALS — BP 148/82 | HR 64 | Ht 70.0 in | Wt 208.0 lb

## 2013-10-13 DIAGNOSIS — D689 Coagulation defect, unspecified: Secondary | ICD-10-CM

## 2013-10-13 DIAGNOSIS — I739 Peripheral vascular disease, unspecified: Secondary | ICD-10-CM

## 2013-10-13 DIAGNOSIS — Z01818 Encounter for other preprocedural examination: Secondary | ICD-10-CM | POA: Diagnosis not present

## 2013-10-13 DIAGNOSIS — Z79899 Other long term (current) drug therapy: Secondary | ICD-10-CM

## 2013-10-13 NOTE — Patient Instructions (Signed)
Dr. Gwenlyn Found has ordered a peripheral angiogram to be done at [facility].  This procedure is going to look at the bloodflow in your lower extremities.  If Dr. Gwenlyn Found is able to open up the arteries, you will have to spend one night in the hospital.  If he is not able to open the arteries, you will be able to go home that same day.    After the procedure, you will not be allowed to drive for 3 days or push, pull, or lift anything greater than 10 lbs for one week.    You will be required to have bloodwork prior to your procedure.  Our scheduler will advise you on when this item need to be done.    REPS: Randall Hiss and Altamese Dilling

## 2013-10-13 NOTE — Progress Notes (Signed)
10/13/2013 David [name]   1932-09-03  [id number]  Primary Physician Mayra Neer, MD Primary Cardiologist: Lorretta Harp MD Renae Gloss   HPI:  .David [name] is a69 year old married Caucasian male father of 3 children is retired Tree surgeon for a company. He was initially patient of Dr. Rush Landmark [name]'s followed by Dr. Terance [name] and now Dr. Sallyanne [name]. He has a history of peripheral arterial disease with known moderate right internal carotid artery stenosis but with ultrasound as well as an occluded left popliteal artery by angiography back in 2009. He has geniculate collaterals with three-vessel runoff and lifestyle limiting claudication. His other problems include a history of hypertension and hyperlipidemia. He had coronary bypass grafting 09/10/88 and has had 3 stent procedures since most recently by Dr. Ellouise [name] on 03/04/13 which time he placed a drug-eluting stent in the obtuse marginal vein graft. He denies chest pain or shortness of breath. He is currently displaying chronic rehabilitation. Because of ongoing claudication left greater than right as well as advances in technology it was decided to attempt percutaneous revascularization of the left popliteal chronic total occlusion.I attempted this on 08/31/2013 unsuccessfully. The patient is returns in followup of left greater than right lower extremity lifestyle limiting claudication. The decision was to attempt percutaneous transposition of the right popliteal artery and then potentially refer him to Dr. Brunetta [name] at [facility] in [place] for attempt at tibial pedal access retrograde recanalization of the left popliteal artery    Current Outpatient Prescriptions  Medication Sig Dispense Refill  . acetaminophen (TYLENOL) 325 MG tablet Take 2 tablets (650 mg total) by mouth every 4 (four) hours as needed for pain or fever.      Marland Kitchen aspirin EC 81 MG tablet Take 81 mg by mouth at bedtime.      . bimatoprost (LUMIGAN)  0.01 % SOLN Place 1 drop into both eyes at bedtime.       . carvedilol (COREG) 12.5 MG tablet Take 12.5 mg by mouth 2 (two) times daily with a meal.       . cetirizine (ZYRTEC) 10 MG tablet Take 10 mg by mouth daily as needed for allergies.      . cilostazol (PLETAL) 100 MG tablet Take 100 mg by mouth 2 (two) times daily.      . clopidogrel (PLAVIX) 75 MG tablet Take 1 tablet (75 mg total) by mouth daily.  90 tablet  3  . Glucosamine-Chondroitin 750-600 MG TABS Take 1 tablet by mouth 2 (two) times daily.      Marland Kitchen lisinopril (PRINIVIL,ZESTRIL) 20 MG tablet Take 20 mg by mouth daily.      . Multiple Vitamins-Minerals (SENIOR MULTIVITAMIN PLUS PO) Take 1 tablet by mouth daily.      . nitroGLYCERIN (NITROSTAT) 0.4 MG SL tablet Place 1 tablet (0.4 mg total) under the tongue every 5 (five) minutes as needed for chest pain.  25 tablet  2  . omega-3 acid ethyl esters (LOVAZA) 1 G capsule Take 2 g by mouth 2 (two) times daily.       . pantoprazole (PROTONIX) 40 MG tablet Take 1 tablet (40 mg total) by mouth daily.  90 tablet  3  . rosuvastatin (CRESTOR) 40 MG tablet Take 1 tablet (40 mg total) by mouth at bedtime.       No current facility-administered medications for this visit.    Allergies  Allergen Reactions  . Azithromycin Diarrhea  . Lactose Intolerance (Gi)   . Niacin And Related  Diarrhea  . Sulfa Antibiotics Rash    History   Social History  . Marital Status: Married    Spouse Name: N/A    Number of Children: N/A  . Years of Education: N/A   Occupational History  . Not on file.   Social History Main Topics  . Smoking status: Former Smoker -- 0.12 packs/day for 1 years    Types: Cigarettes    Quit date: 08/06/1956  . Smokeless tobacco: Never Used  . Alcohol Use: 2.4 oz/week    4 Glasses of wine per week  . Drug Use: No  . Sexual Activity: No   Other Topics Concern  . Not on file   Social History Narrative  . No narrative on file     Review of Systems: General:  negative for chills, fever, night sweats or weight changes.  Cardiovascular: negative for chest pain, dyspnea on exertion, edema, orthopnea, palpitations, paroxysmal nocturnal dyspnea or shortness of breath Dermatological: negative for rash Respiratory: negative for cough or wheezing Urologic: negative for hematuria Abdominal: negative for nausea, vomiting, diarrhea, bright red blood per rectum, melena, or hematemesis Neurologic: negative for visual changes, syncope, or dizziness All other systems reviewed and are otherwise negative except as noted above.    Blood pressure 148/82, pulse 64, height 5\' 10"  (1.778 m), weight 208 lb (94.348 kg).  General appearance: alert and no distress Neck: no adenopathy, no carotid bruit, no JVD, supple, symmetrical, trachea midline and thyroid not enlarged, symmetric, no tenderness/mass/nodules Lungs: clear to auscultation bilaterally Heart: regular rate and rhythm, S1, S2 normal, no murmur, click, rub or gallop Extremities: extremities normal, atraumatic, no cyanosis or edema  EKG not performed today  ASSESSMENT AND PLAN:   PVD - Lt popliteal occlusion with collaterals '09 Status post failed attempt at antegrade recanalization of a chronic totally occluded distal left the same popliteal artery which was calcified. He had not represent right popliteal artery stenosis that was highly calcified as well. He does have left greater than right lower extremity was followed and claudication. He was admitted for coronary bypass grafting I'm hesitant to recommend him below the knee bypass grafting using his only remaining conduits in the event that he should need it these in the future for his heart. We talked about percutaneously fixing his right popliteal artery using diamondback will atherectomy potentially having him see Dr. Brunetta [name] at [facility] for consideration of tibial pedal access retrograde recanalization.      Lorretta Harp MD  FACP,FACC,FAHA, [facility] 10/13/2013 3:39 PM .David [name]

## 2013-10-13 NOTE — Assessment & Plan Note (Signed)
Status post failed attempt at antegrade recanalization of a chronic totally occluded distal left the same popliteal artery which was calcified. He had not represent right popliteal artery stenosis that was highly calcified as well. He does have left greater than right lower extremity was followed and claudication. He was admitted for coronary bypass grafting I'm hesitant to recommend him below the knee bypass grafting using his only remaining conduits in the event that he should need it these in the future for his heart. We talked about percutaneously fixing his right popliteal artery using diamondback will atherectomy potentially having him see Dr. Brunetta Jeans at [facility] for consideration of tibial pedal access retrograde recanalization.

## 2013-10-14 DIAGNOSIS — D689 Coagulation defect, unspecified: Secondary | ICD-10-CM | POA: Diagnosis not present

## 2013-10-14 DIAGNOSIS — I739 Peripheral vascular disease, unspecified: Secondary | ICD-10-CM | POA: Diagnosis not present

## 2013-10-14 DIAGNOSIS — Z0000 Encounter for general adult medical examination without abnormal findings: Secondary | ICD-10-CM | POA: Diagnosis not present

## 2013-10-14 DIAGNOSIS — C8588 Other specified types of non-Hodgkin lymphoma, lymph nodes of multiple sites: Secondary | ICD-10-CM | POA: Diagnosis not present

## 2013-10-14 DIAGNOSIS — E782 Mixed hyperlipidemia: Secondary | ICD-10-CM | POA: Diagnosis not present

## 2013-10-14 DIAGNOSIS — I2510 Atherosclerotic heart disease of native coronary artery without angina pectoris: Secondary | ICD-10-CM | POA: Diagnosis not present

## 2013-10-14 DIAGNOSIS — Z1331 Encounter for screening for depression: Secondary | ICD-10-CM | POA: Diagnosis not present

## 2013-10-14 DIAGNOSIS — I119 Hypertensive heart disease without heart failure: Secondary | ICD-10-CM | POA: Diagnosis not present

## 2013-10-14 DIAGNOSIS — Z1211 Encounter for screening for malignant neoplasm of colon: Secondary | ICD-10-CM | POA: Diagnosis not present

## 2013-10-19 ENCOUNTER — Encounter (HOSPITAL_COMMUNITY): Payer: Self-pay | Admitting: General Practice

## 2013-10-19 ENCOUNTER — Encounter (HOSPITAL_COMMUNITY)
Admission: RE | Disposition: A | Discharge status: Home / Self Care | Payer: Self-pay | Source: Ambulatory Visit | Attending: Cardiovascular Disease

## 2013-10-19 ENCOUNTER — Ambulatory Visit (HOSPITAL_COMMUNITY)
Admission: RE | Admit: 2013-10-19 | Discharge: 2013-10-21 | Disposition: A | Discharge status: Home / Self Care | Payer: Medicare Other | Source: Ambulatory Visit | Attending: Cardiovascular Disease | Admitting: Cardiovascular Disease

## 2013-10-19 DIAGNOSIS — Z7982 Long term (current) use of aspirin: Secondary | ICD-10-CM | POA: Insufficient documentation

## 2013-10-19 DIAGNOSIS — Z95820 Peripheral vascular angioplasty status with implants and grafts: Secondary | ICD-10-CM

## 2013-10-19 DIAGNOSIS — Y9210 Unspecified residential institution as the place of occurrence of the external cause: Secondary | ICD-10-CM | POA: Insufficient documentation

## 2013-10-19 DIAGNOSIS — I428 Other cardiomyopathies: Secondary | ICD-10-CM | POA: Diagnosis not present

## 2013-10-19 DIAGNOSIS — D62 Acute posthemorrhagic anemia: Secondary | ICD-10-CM

## 2013-10-19 DIAGNOSIS — Y849 Medical procedure, unspecified as the cause of abnormal reaction of the patient, or of later complication, without mention of misadventure at the time of the procedure: Secondary | ICD-10-CM | POA: Insufficient documentation

## 2013-10-19 DIAGNOSIS — E785 Hyperlipidemia, unspecified: Secondary | ICD-10-CM | POA: Diagnosis present

## 2013-10-19 DIAGNOSIS — I739 Peripheral vascular disease, unspecified: Secondary | ICD-10-CM

## 2013-10-19 DIAGNOSIS — Z951 Presence of aortocoronary bypass graft: Secondary | ICD-10-CM | POA: Insufficient documentation

## 2013-10-19 DIAGNOSIS — Z87898 Personal history of other specified conditions: Secondary | ICD-10-CM | POA: Diagnosis not present

## 2013-10-19 DIAGNOSIS — I729 Aneurysm of unspecified site: Secondary | ICD-10-CM | POA: Diagnosis not present

## 2013-10-19 DIAGNOSIS — Z9861 Coronary angioplasty status: Secondary | ICD-10-CM | POA: Diagnosis not present

## 2013-10-19 DIAGNOSIS — I442 Atrioventricular block, complete: Secondary | ICD-10-CM | POA: Diagnosis present

## 2013-10-19 DIAGNOSIS — Z7902 Long term (current) use of antithrombotics/antiplatelets: Secondary | ICD-10-CM | POA: Insufficient documentation

## 2013-10-19 DIAGNOSIS — IMO0002 Reserved for concepts with insufficient information to code with codable children: Secondary | ICD-10-CM | POA: Insufficient documentation

## 2013-10-19 DIAGNOSIS — I10 Essential (primary) hypertension: Secondary | ICD-10-CM | POA: Insufficient documentation

## 2013-10-19 DIAGNOSIS — S301XXA Contusion of abdominal wall, initial encounter: Secondary | ICD-10-CM

## 2013-10-19 DIAGNOSIS — I70219 Atherosclerosis of native arteries of extremities with intermittent claudication, unspecified extremity: Secondary | ICD-10-CM

## 2013-10-19 DIAGNOSIS — C8590 Non-Hodgkin lymphoma, unspecified, unspecified site: Secondary | ICD-10-CM | POA: Diagnosis present

## 2013-10-19 DIAGNOSIS — Z01818 Encounter for other preprocedural examination: Secondary | ICD-10-CM

## 2013-10-19 DIAGNOSIS — I25810 Atherosclerosis of coronary artery bypass graft(s) without angina pectoris: Secondary | ICD-10-CM

## 2013-10-19 DIAGNOSIS — I255 Ischemic cardiomyopathy: Secondary | ICD-10-CM | POA: Diagnosis present

## 2013-10-19 DIAGNOSIS — T81718A Complication of other artery following a procedure, not elsewhere classified, initial encounter: Secondary | ICD-10-CM

## 2013-10-19 HISTORY — DX: Peripheral vascular disease, unspecified: I73.9

## 2013-10-19 LAB — CBC
HEMATOCRIT: 37.8 % — AB (ref 39.0–52.0)
Hemoglobin: 13.3 g/dL (ref 13.0–17.0)
MCH: 31.8 pg (ref 26.0–34.0)
MCHC: 35.2 g/dL (ref 30.0–36.0)
MCV: 90.4 fL (ref 78.0–100.0)
Platelets: 130 10*3/uL — ABNORMAL LOW (ref 150–400)
RBC: 4.18 MIL/uL — ABNORMAL LOW (ref 4.22–5.81)
RDW: 13.5 % (ref 11.5–15.5)
WBC: 4.2 10*3/uL (ref 4.0–10.5)

## 2013-10-19 LAB — POCT ACTIVATED CLOTTING TIME
ACTIVATED CLOTTING TIME: 171 s
ACTIVATED CLOTTING TIME: 299 s
ACTIVATED CLOTTING TIME: 155 s
Activated Clotting Time: 243 seconds
Activated Clotting Time: 210 seconds

## 2013-10-19 LAB — BASIC METABOLIC PANEL
BUN: 20 mg/dL (ref 6–23)
CHLORIDE: 102 meq/L (ref 96–112)
CO2: 23 mEq/L (ref 19–32)
Calcium: 9.1 mg/dL (ref 8.4–10.5)
Creatinine, Ser: 1.05 mg/dL (ref 0.50–1.35)
GFR, EST AFRICAN AMERICAN: 76 mL/min — AB (ref >90)
GFR, EST NON AFRICAN AMERICAN: 66 mL/min — AB (ref >90)
Glucose, Bld: 112 mg/dL — ABNORMAL HIGH (ref 70–99)
POTASSIUM: 4.2 meq/L (ref 3.7–5.3)
SODIUM: 141 meq/L (ref 137–147)

## 2013-10-19 LAB — PROTIME-INR
INR: 1.04 (ref 0.00–1.49)
Prothrombin Time: 13.4 seconds (ref 11.6–15.2)

## 2013-10-19 LAB — APTT: APTT: 30 s (ref 24–37)

## 2013-10-19 SURGERY — ATHERECTOMY PERIPHERAL ARTERY
Laterality: Right

## 2013-10-19 MED ORDER — ASPIRIN EC 81 MG PO TBEC: 81.0000 mg | DELAYED_RELEASE_TABLET | Freq: Every day | ORAL | Status: DC

## 2013-10-19 MED ORDER — MORPHINE SULFATE 2 MG/ML IJ SOLN
2.0000 mg | INTRAMUSCULAR | Status: DC | PRN
  Administered 2013-10-19 – 2013-10-20 (×2): 2 mg via INTRAVENOUS
  Filled 2013-10-19 (×2): qty 1

## 2013-10-19 MED ORDER — CARVEDILOL 12.5 MG PO TABS
12.5000 mg | ORAL_TABLET | Freq: Two times a day (BID) | ORAL | Status: DC
  Administered 2013-10-19 – 2013-10-21 (×4): 12.5 mg via ORAL
  Filled 2013-10-19 (×6): qty 1

## 2013-10-19 MED ORDER — CLOPIDOGREL BISULFATE 75 MG PO TABS
75.0000 mg | ORAL_TABLET | Freq: Every day | ORAL | Status: DC
  Administered 2013-10-20: 17:00:00 75 mg via ORAL
  Filled 2013-10-19 (×2): qty 1

## 2013-10-19 MED ORDER — HYDRALAZINE HCL 20 MG/ML IJ SOLN
10.0000 mg | INTRAMUSCULAR | Status: DC
  Administered 2013-10-19: 10 mg via INTRAVENOUS
  Filled 2013-10-19: qty 1

## 2013-10-19 MED ORDER — DIAZEPAM 5 MG PO TABS
5.0000 mg | ORAL_TABLET | ORAL | Status: AC
  Administered 2013-10-19: 5 mg via ORAL
  Filled 2013-10-19: qty 1

## 2013-10-19 MED ORDER — FENTANYL CITRATE 0.05 MG/ML IJ SOLN
INTRAMUSCULAR | Status: AC
Start: 2013-10-19 — End: 2013-10-19
  Filled 2013-10-19: qty 2

## 2013-10-19 MED ORDER — ACETAMINOPHEN 325 MG PO TABS: 650.0000 mg | ORAL_TABLET | ORAL | Status: DC | PRN

## 2013-10-19 MED ORDER — NITROGLYCERIN 0.4 MG SL SUBL: 0.4000 mg | SUBLINGUAL_TABLET | SUBLINGUAL | Status: DC | PRN

## 2013-10-19 MED ORDER — ASPIRIN 81 MG PO CHEW
81.0000 mg | CHEWABLE_TABLET | ORAL | Status: AC
  Administered 2013-10-19: 81 mg via ORAL
  Filled 2013-10-19: qty 1

## 2013-10-19 MED ORDER — CLOPIDOGREL BISULFATE 75 MG PO TABS
75.0000 mg | ORAL_TABLET | Freq: Once | ORAL | Status: AC
  Administered 2013-10-19: 17:00:00 75 mg via ORAL

## 2013-10-19 MED ORDER — SODIUM CHLORIDE 0.9 % IJ SOLN: 3.0000 mL | INTRAMUSCULAR | Status: DC | PRN

## 2013-10-19 MED ORDER — PANTOPRAZOLE SODIUM 40 MG PO TBEC
40.0000 mg | DELAYED_RELEASE_TABLET | Freq: Every day | ORAL | Status: DC
  Administered 2013-10-20 – 2013-10-21 (×2): 40 mg via ORAL
  Filled 2013-10-19 (×3): qty 1

## 2013-10-19 MED ORDER — MIDAZOLAM HCL 2 MG/2ML IJ SOLN
INTRAMUSCULAR | Status: AC
  Filled 2013-10-19: qty 2

## 2013-10-19 MED ORDER — CLOPIDOGREL BISULFATE 75 MG PO TABS: 75.0000 mg | ORAL_TABLET | Freq: Every day | ORAL | Status: DC

## 2013-10-19 MED ORDER — LIDOCAINE HCL (PF) 1 % IJ SOLN
INTRAMUSCULAR | Status: AC
  Filled 2013-10-19: qty 30

## 2013-10-19 MED ORDER — LORATADINE 10 MG PO TABS
10.0000 mg | ORAL_TABLET | Freq: Every day | ORAL | Status: DC
  Filled 2013-10-19 (×3): qty 1

## 2013-10-19 MED ORDER — VERAPAMIL HCL 2.5 MG/ML IV SOLN
INTRAVENOUS | Status: AC
  Filled 2013-10-19: qty 2

## 2013-10-19 MED ORDER — SODIUM CHLORIDE 0.9 % IV SOLN
INTRAVENOUS | Status: DC
Start: 2013-10-20 — End: 2013-10-19
  Administered 2013-10-19: 09:00:00 via INTRAVENOUS

## 2013-10-19 MED ORDER — ASPIRIN EC 325 MG PO TBEC
325.0000 mg | DELAYED_RELEASE_TABLET | Freq: Every day | ORAL | Status: DC
  Filled 2013-10-19: qty 1

## 2013-10-19 MED ORDER — HEPARIN (PORCINE) IN NACL 2-0.9 UNIT/ML-% IJ SOLN
INTRAMUSCULAR | Status: AC
  Filled 2013-10-19: qty 1000

## 2013-10-19 MED ORDER — LISINOPRIL 20 MG PO TABS
20.0000 mg | ORAL_TABLET | Freq: Every day | ORAL | Status: DC
  Filled 2013-10-19: qty 1

## 2013-10-19 MED ORDER — HEPARIN SODIUM (PORCINE) 1000 UNIT/ML IJ SOLN
INTRAMUSCULAR | Status: AC
  Filled 2013-10-19: qty 1

## 2013-10-19 MED ORDER — CILOSTAZOL 100 MG PO TABS
100.0000 mg | ORAL_TABLET | Freq: Two times a day (BID) | ORAL | Status: DC
  Administered 2013-10-19 – 2013-10-21 (×4): 100 mg via ORAL
  Filled 2013-10-19 (×5): qty 1

## 2013-10-19 MED ORDER — SODIUM CHLORIDE 0.9 % IV SOLN
INTRAVENOUS | Status: AC
  Administered 2013-10-19: 13:00:00 via INTRAVENOUS

## 2013-10-19 NOTE — CV Procedure (Signed)
David [name] is a 80 y.o. male    [id number] LOCATION:  FACILITY: [facility]  PHYSICIAN: Quay Burow, M.D. 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 He was initially patient of Dr. Rush Landmark Gamble's followed by Dr. Terance Ice and now Dr. [name]. He has a history of peripheral arterial disease with known moderate right internal carotid artery stenosis but with ultrasound as well as an occluded left popliteal artery by angiography back in 2009. He has geniculate collaterals with three-vessel runoff and lifestyle limiting claudication. His other problems include a history of hypertension and hyperlipidemia. He had coronary bypass grafting 09/10/88 and has had 3 stent procedures since most recently by Dr. Ellouise Newer on 03/04/13 which time he placed a drug-eluting stent in the obtuse marginal vein graft. He denies chest pain or shortness of breath. He is currently displaying chronic rehabilitation. Because of ongoing claudication left greater than right as well as advances in technology it was decided to attempt percutaneous revascularization of the left popliteal chronic total occlusion.I attempted this on 08/31/2013 unsuccessfully. The patient is returns in followup of left greater than right lower extremity lifestyle limiting claudication. The decision was to attempt percutaneous transposition of the right popliteal artery and then potentially refer him to Dr. Brunetta Jeans at [facility] in [place] for attempt at tibial pedal access retrograde recanalization of the left popliteal artery    PROCEDURE DESCRIPTION:   The patient was brought to the second floor [facility] cath lab in the postabsorptive state. He was premedicated with Valium 5 mg by mouth and IV Versed and fentanyl. His left  groinwas prepped and shaved in usual sterile fashion. Xylocaine 1% was used for local anesthesia. A 5 French sheath was inserted into the left common femoral artery using standard Seldinger technique.contralateral access was obtained with a 5 [place] crossover catheter, 0.35 Versed for wire and a 7 French/55 cm Ansel Sheath. Visipaque allergies for the entirety of the case (155 cc the patient total), retrograde aortic pressure was monitored during the case.   HEMODYNAMICS:    AO SYSTOLIC/AO DIASTOLIC: 063/01   Angiographic Data:   The patient received 10,000 units of heparin intravenously with an ACT of 210. I was able to cross the highly calcified high-grade popliteal lesion with an 0.35 angled Navicross endhole catheter and a 0.35  angled Glidewire. I then exchanged for a 0.14/300 cm length viper wire. I performed diamondback orbital rotational atherectomy with a 2 mm Stealth Burr up to 120,000 rpm's. This resulted in significant debulking of the highly calcified segment. Prior to performing atherectomy I placed a NAV  6 filter for distal protection just above the takeoff of the anterior tibial artery. I then performed into possibly with a 6 mm x 40 mm long chocolate balloon upgrading to a 7 mm x 60 mm long sterling balloon and finally stenting with a 6.5 mm x 60 mm long IDEV self-expanding stent. Copious amount of intra-arterial NTG  was given during atherectomy. Completion angiography was then performed revealing reduction of a 99% calcified popliteal artery stenosis to 0% residual with excellent flow. The sheath was then withdrawn across the bifurcation exchanged over a 0.35 wire for a short 7 [place] sheath.  IMPRESSION:successful diamondback orbital rotational atherectomy, PTA and stenting using an IDEV stent for high-grade calcified  right popliteal artery stenosis in the setting of lifestyle limiting claudication. The patient is already on dual antibiotic therapy. The sheath will be removed once the  ACT falls below 170 and he will be hydrated overnight, remain recumbent for 6 hours. He will be discharged home in the morning and will get lower extremity arterial Doppler studies in our [place] office after which he will see me back.   Lorretta Harp MD, [facility] 10/19/2013 1:02 PM

## 2013-10-19 NOTE — Progress Notes (Signed)
Site area: left groin  Site Prior to Removal:  Level 1  Pressure Applied For 30 MINUTES    Minutes Beginning at 1500  Manual:   yes  Patient Status During Pull:  see comment  Post Pull Groin Site:  Level 2  Post Pull Instructions Given:  yes  Post Pull Pulses Present:  yes  Dressing Applied:  yes  Comments:  Hematoma prior to pull; increased with sheath removal.  Pressure held above and below site with minor bleeding.  Small hematoma remains with stable margins; margins marked.  Pedal pulses bilateral by doppler; unchanged vs pre sheath removal.  At 1615 patient experienced pressure drop (delayed vagal?) relieved by 10" T-berg and 200 cc NS bolus.  Patient resting comfortably at this time.

## 2013-10-20 DIAGNOSIS — I724 Aneurysm of artery of lower extremity: Secondary | ICD-10-CM | POA: Diagnosis not present

## 2013-10-20 DIAGNOSIS — D62 Acute posthemorrhagic anemia: Secondary | ICD-10-CM | POA: Diagnosis not present

## 2013-10-20 DIAGNOSIS — I25810 Atherosclerosis of coronary artery bypass graft(s) without angina pectoris: Secondary | ICD-10-CM

## 2013-10-20 DIAGNOSIS — I739 Peripheral vascular disease, unspecified: Secondary | ICD-10-CM | POA: Diagnosis not present

## 2013-10-20 LAB — URINALYSIS, ROUTINE W REFLEX MICROSCOPIC
Bilirubin Urine: NEGATIVE
Glucose, UA: NEGATIVE mg/dL
Hgb urine dipstick: NEGATIVE
KETONES UR: NEGATIVE mg/dL
LEUKOCYTES UA: NEGATIVE
NITRITE: NEGATIVE
PH: 6.5 (ref 5.0–8.0)
Protein, ur: NEGATIVE mg/dL
Specific Gravity, Urine: 1.008 (ref 1.005–1.030)
Urobilinogen, UA: 0.2 mg/dL (ref 0.0–1.0)

## 2013-10-20 LAB — CBC
HCT: 31.4 % — ABNORMAL LOW (ref 39.0–52.0)
HCT: 32.1 % — ABNORMAL LOW (ref 39.0–52.0)
HEMOGLOBIN: 10.9 g/dL — AB (ref 13.0–17.0)
Hemoglobin: 11.3 g/dL — ABNORMAL LOW (ref 13.0–17.0)
MCH: 31.5 pg (ref 26.0–34.0)
MCH: 32 pg (ref 26.0–34.0)
MCHC: 34.7 g/dL (ref 30.0–36.0)
MCHC: 35.2 g/dL (ref 30.0–36.0)
MCV: 90.8 fL (ref 78.0–100.0)
MCV: 90.9 fL (ref 78.0–100.0)
PLATELETS: 110 10*3/uL — AB (ref 150–400)
PLATELETS: 115 10*3/uL — AB (ref 150–400)
RBC: 3.46 MIL/uL — ABNORMAL LOW (ref 4.22–5.81)
RBC: 3.53 MIL/uL — AB (ref 4.22–5.81)
RDW: 13.8 % (ref 11.5–15.5)
RDW: 13.7 % (ref 11.5–15.5)
WBC: 5.8 10*3/uL (ref 4.0–10.5)
WBC: 5.2 10*3/uL (ref 4.0–10.5)

## 2013-10-20 LAB — BASIC METABOLIC PANEL
BUN: 20 mg/dL (ref 6–23)
CALCIUM: 8.6 mg/dL (ref 8.4–10.5)
CO2: 23 mEq/L (ref 19–32)
Chloride: 103 mEq/L (ref 96–112)
Creatinine, Ser: 1.11 mg/dL (ref 0.50–1.35)
GFR calc Af Amer: 71 mL/min — ABNORMAL LOW (ref >90)
GFR calc non Af Amer: 62 mL/min — ABNORMAL LOW (ref >90)
Glucose, Bld: 115 mg/dL — ABNORMAL HIGH (ref 70–99)
Potassium: 3.9 mEq/L (ref 3.7–5.3)
Sodium: 140 mEq/L (ref 137–147)

## 2013-10-20 MED ORDER — SODIUM CHLORIDE 0.9 % IV SOLN
INTRAVENOUS | Status: AC
  Administered 2013-10-20: 12:00:00 via INTRAVENOUS

## 2013-10-20 MED ORDER — ASPIRIN EC 81 MG PO TBEC
81.0000 mg | DELAYED_RELEASE_TABLET | Freq: Every day | ORAL | Status: DC
  Administered 2013-10-20 – 2013-10-21 (×2): 81 mg via ORAL
  Filled 2013-10-20 (×2): qty 1

## 2013-10-20 NOTE — Progress Notes (Addendum)
Vascular duplex noted; pseudoaneurysm present with 2 chambers already thrombosed. Hopeful that 3rd chamber will spontaneously close. Will repeat duplex in AM. Dayna Dunn PA-C  Addendum at 2:29 PM: CBC stable. Plan as above. Dayna Dunn PA-C

## 2013-10-20 NOTE — Progress Notes (Signed)
VASCULAR LAB PRELIMINARY  PRELIMINARY  PRELIMINARY  PRELIMINARY  Left femoral artery duplex completed.    Preliminary report:  Positive for a multi chambered pseudoaneurysm 3 chambers, Two have thrombosed and the third is small with a small short neck. Unable to visualize the native vessel due to bruising.  Danni Leabo, RVS 10/20/2013, 11:40 AM

## 2013-10-20 NOTE — Progress Notes (Signed)
  Patient: David [name] / Admit Date: 10/19/2013 / Date of Encounter: 10/20/2013, 7:09 AM  Subjective  Feeling tired. Small amount of bleeding at femoral site this AM with moderate ecchymosis. No CP, SOB, leg pain.  Objective   Telemetry: AV paced, rare PVC  Physical Exam: Blood pressure 120/98, pulse 60, temperature 97.5 F (36.4 C), temperature source Oral, resp. rate 20, height 5' 9.5" (1.765 m), weight 206 lb 2.1 oz (93.5 kg), SpO2 93.00%. General: Well developed, well nourished WM, in no acute distress. Head: Normocephalic, atraumatic, sclera non-icteric, no xanthomas, nares are without discharge. Neck: JVP not elevated. Lungs: Clear bilaterally to auscultation without wheezes, rales, or rhonchi. Breathing is unlabored. Heart: RRR S1 S2 without murmurs, rubs, or gallops.  Abdomen: Soft, non-tender, non-distended with normoactive bowel sounds. No rebound/guarding. Extremities: No clubbing or cyanosis. No edema. Pedal pulses diminished bilaterally, unable to palpate pulse on L. L groin with moderate ecchymosis from crook of groin extending around to LLQ abdomen. No extension of hematoma beyond axillary line or back. Bleeding has stopped. No bruit. Neuro: Alert and oriented X 3. Moves all extremities spontaneously. Psych:  Responds to questions appropriately with a normal affect.   Intake/Output Summary (Last 24 hours) at 10/20/13 0709 Last data filed at 10/20/13 0645  Gross per 24 hour  Intake 1722.5 ml  Output   1625 ml  Net   97.5 ml    Inpatient Medications:  . aspirin EC  325 mg Oral Daily  . carvedilol  12.5 mg Oral BID WC  . cilostazol  100 mg Oral BID  . clopidogrel  75 mg Oral Q breakfast  . hydrALAZINE  10 mg Intravenous UD  . lisinopril  20 mg Oral Daily  . loratadine  10 mg Oral Daily  . pantoprazole  40 mg Oral Daily   Infusions:    Labs:  Recent Labs  10/19/13 0857  NA 141  K 4.2  CL 102  CO2 23  GLUCOSE 112*  BUN 20  CREATININE 1.05  CALCIUM 9.1      Recent Labs  10/19/13 0857  WBC 4.2  HGB 13.3  HCT 37.8*  MCV 90.4  PLT 130*   Radiology/Studies:  PV angio   Assessment and Plan  1. PVD with lifestyle-limiting claudication and prior intervention - s/p successful diamondback orbital rotational atherectomy, PTA, stenting to right popliteal artery stenosis 2. Post-angio ecchymosis and bleeding 3. Hypotension during sheath pull ? vagal response  4. CAD s/p prior CABG, PCI  Will get MD opinion on ecchymosis. Oozing has stopped. BP is much better this AM. Continue home regimen. Await followup labs this AM. Dr. Claiborne Billings - should we decrease ASA to 81mg  daily? He is on 325mg  daily (in addition to Plavix and Pletal).  Signed, Melina Copa PA-C  Patient seen and examined. Agree with assessment and plan. No chest pain. Moderate ecchymosis at left groin site. No oozing or hematoma prior to sheath pull; suspect developed with sheath pull. No bruit. Doubt pseudoaneurysm or AV fistula. Await labs today. Probable dc later today if H/H not significantly dropped. Decrease ASA to 81 mg.   Troy Sine, MD, [facility] 10/20/2013 8:04 AM

## 2013-10-20 NOTE — Progress Notes (Signed)
Hgb 13.3->10.9, Plt 130->110. D/w Dr. Claiborne Billings. Would prefer for patient safety to keep and observe for today. Recheck CBC at 2pm. Check art duplex to rule out blood vessel injury. Gentle IV hydration as BP has been on softer side - 95's-120 (EF 40-45% in 2014). He received Coreg today as planned but will hold Lisinopril as a precaution and keep eye on BP. Decreasing ASA to 81mg . Pt aware and agreeable of plan. Bethaney Oshana PA-C

## 2013-10-21 ENCOUNTER — Other Ambulatory Visit: Payer: Self-pay | Admitting: Physician Assistant

## 2013-10-21 DIAGNOSIS — I739 Peripheral vascular disease, unspecified: Secondary | ICD-10-CM

## 2013-10-21 DIAGNOSIS — I25810 Atherosclerosis of coronary artery bypass graft(s) without angina pectoris: Secondary | ICD-10-CM | POA: Diagnosis not present

## 2013-10-21 DIAGNOSIS — D62 Acute posthemorrhagic anemia: Secondary | ICD-10-CM | POA: Diagnosis not present

## 2013-10-21 DIAGNOSIS — I10 Essential (primary) hypertension: Secondary | ICD-10-CM

## 2013-10-21 DIAGNOSIS — I2589 Other forms of chronic ischemic heart disease: Secondary | ICD-10-CM | POA: Diagnosis not present

## 2013-10-21 DIAGNOSIS — Z48812 Encounter for surgical aftercare following surgery on the circulatory system: Secondary | ICD-10-CM | POA: Diagnosis not present

## 2013-10-21 DIAGNOSIS — I729 Aneurysm of unspecified site: Secondary | ICD-10-CM

## 2013-10-21 LAB — BASIC METABOLIC PANEL
BUN: 22 mg/dL (ref 6–23)
CALCIUM: 8.3 mg/dL — AB (ref 8.4–10.5)
CO2: 21 mEq/L (ref 19–32)
CREATININE: 1.18 mg/dL (ref 0.50–1.35)
Chloride: 105 mEq/L (ref 96–112)
GFR calc Af Amer: 66 mL/min — ABNORMAL LOW (ref >90)
GFR calc non Af Amer: 57 mL/min — ABNORMAL LOW (ref >90)
Glucose, Bld: 108 mg/dL — ABNORMAL HIGH (ref 70–99)
POTASSIUM: 4 meq/L (ref 3.7–5.3)
SODIUM: 141 meq/L (ref 137–147)

## 2013-10-21 LAB — CBC
HCT: 28.4 % — ABNORMAL LOW (ref 39.0–52.0)
HEMOGLOBIN: 9.9 g/dL — AB (ref 13.0–17.0)
MCH: 31.4 pg (ref 26.0–34.0)
MCHC: 34.9 g/dL (ref 30.0–36.0)
MCV: 90.2 fL (ref 78.0–100.0)
PLATELETS: 101 10*3/uL — AB (ref 150–400)
RBC: 3.15 MIL/uL — ABNORMAL LOW (ref 4.22–5.81)
RDW: 13.5 % (ref 11.5–15.5)
WBC: 4.9 10*3/uL (ref 4.0–10.5)

## 2013-10-21 LAB — HEMOGLOBIN AND HEMATOCRIT, BLOOD
HCT: 30.7 % — ABNORMAL LOW (ref 39.0–52.0)
Hemoglobin: 10.5 g/dL — ABNORMAL LOW (ref 13.0–17.0)

## 2013-10-21 MED ORDER — LISINOPRIL 20 MG PO TABS
20.0000 mg | ORAL_TABLET | Freq: Every day | ORAL | Status: DC
  Administered 2013-10-21: 20 mg via ORAL
  Filled 2013-10-21: qty 1

## 2013-10-21 NOTE — Plan of Care (Signed)
Problem: Phase II Progression Outcomes Goal: Vascular site scale level 0 - I Vascular Site Scale Level 0: No bruising/bleeding/hematoma Level I (Mild): Bruising/Ecchymosis, minimal bleeding/ooozing, palpable hematoma < 3 cm Level II (Moderate): Bleeding not affecting hemodynamic parameters, pseudoaneurysm, palpable hematoma > 3 cm Level III (Severe) Bleeding which affects hemodynamic parameters or retroperitoneal hemorrhage  Outcome: Not Applicable Date Met:  16/94/50 Level two; documented in chart.

## 2013-10-21 NOTE — Progress Notes (Signed)
VASCULAR LAB PRELIMINARY  PRELIMINARY  PRELIMINARY  PRELIMINARY  Left femoral artery duplex for pseudoaneurysm completed.    Preliminary report:  Pseudoaneurysm remains.  Measures 0.63cm x 0.66cm at approximately 3cm deep.    Briteny Fulghum, RVT 10/21/2013, 9:14 AM

## 2013-10-21 NOTE — Progress Notes (Signed)
I have seen and evaluated the patient this AM along with Perry Mount, PA-C. I agree with her findings, examination as well as impression recommendations.  Pt monitored o/n due to Post Cath - sheath pull related Pseudoaneurysm.  -- 2 of 3 lobes closed yesterday/  Significant ecchymoses --> with tenderness & hardened hematoma on lateral aspect of access site.  No obvious bruit.  On his way down for repeat US  Hgb dropped a bit -> not unexpected with extent of ecchymoses & IVF for sheath pull Vagal hypotension. Recheck this AM  BP stable - restart ACE-I HR mostly paced --> no sign of continued bleed.  Needs to walk today == R foot warm with palpable DP  If h/h stable & Pseudoaneurysm continues to be small, anticpate d/c later today.  Will need f/u LE Atery Dopplers & ABIs with L Fem A doppler (specifically for PseudoA) next week prior to ROV with Dr. Gwenlyn Found or PA.   Leonie Man, M.D., M.S. Interventional Cardiologist  [place] Pager # [phone] 10/21/2013

## 2013-10-21 NOTE — Discharge Summary (Signed)
Discharge Summary   Patient ID: David [name] MRN: [id number], DOB/AGE: August 12, 1932 80 y.o. Admit date: 10/19/2013 D/C date:     10/21/2013  Primary Cardiologist: Dr. Gwenlyn Found (PAD) Dr. [name] (primary cardiology)  Active Problems:   CAD s/p CABG 1995, s/p stent SVG-OM 2007 and July 2014   Complete heart block- PTVDP '95 with Gen change '02 and '09 (BS). Pacer dependednt   PVD - Lt popliteal occlusion with collaterals '09   Non Hodgkin's lymphoma- Feb 2011- radiation Rx   Dyslipidemia   HTN (hypertension)   Cardiomyopathy, ischemic- EF 40-45% by echo 4/14   Claudication- unsuccessful PTA attempt Lt popliteal 08/31/13   Hematoma of groin -right sided; w/p PV angio   Claudication in peripheral vascular disease   Admission Dates: 10/19/13- 10/22/13 Discharge Diagnosis: PVD s/p successful diamondback orbital rotational atherectomy, PTA and stenting using an IDEV stent.  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 He continued to have bilateral lower extremity claudication, L>R, and was scheduled for another PV angiogram on 10/19/12 to attempt a percutaneous transposition of the right popliteal artery.  He is now s/p successful diamondback orbital rotational atherectomy, PTA and stenting using an IDEV stent. His procedure was complicated by a multi-chambered pseudoaneurysm at the catheter site in the left groin, which prolonged his stay.    Hospital Course:  PVD- s/p successful diamondback orbital rotational atherectomy, PTA and stenting using an IDEV stent for high-grade calcified right popliteal artery stenosis in the setting of lifestyle limiting  claudication.  --Continue DAPT w/ ASA/Plavix and cilostazol   L inguinal multi-chambered pseudoaneurysm- Doppler revealed 3 chambers and it had to be held under compression for some time.  Repeat duplex this morning showed the pseudoaneurysm remained stable. At that time it measured 0.63cm x 0.66cm and ~3cm deep. His H/H was noted to drop that that time, and was 9.9 at its lowest. His hemoglobin stabilized on the day of discharge to 10.5.   Hypotension: The patient became hypotensive during the sheath pull which was thought to possibly be a vagal response. His blood pressures were soft so he was given IVF and his lisnopril was held with improvement in BPs. His lisinopril was resumed with stabilized blood pressures.   CAD s/p prior CABG, PCI x3 with DES to obtuse marg vein graft- stable. He had no chest pain on this admission. His troponin was normal and there were no acute ST or TW changes on EKG.  The patient is recovering well and the femoral catheter site is thought to be stable. The patient has been seen by Dr. Ellyn Hack today and deemed safe for discharge home. All follow-up appointments have been scheduled including LE dopplers and L femoral doppler at the pseudoaneurysm site. He will follow up with Dr. Gwenlyn Found in 1 month and has a previously scheduled appointment with Dr. [name] in May. No new medications were added on this admission and he will resume his home medical regimen.    Discharge Vitals: Blood pressure 116/58, pulse 62, temperature 98.5 F (36.9 C), temperature source Oral, resp. rate 18, height 5' 9.5" (1.765 m), weight 207 lb 3.7 oz (  94 kg), SpO2 95.00%.  Labs: Lab Results  Component Value Date   WBC 4.9 10/21/2013   HGB 10.5* 10/21/2013   HCT 30.7* 10/21/2013   MCV 90.2 10/21/2013   PLT 101* 10/21/2013     Lab Results  Component Value Date   CHOL 115 05/07/2011   HDL 37* 05/07/2011   LDLCALC 55 05/07/2011   TRIG 114 05/07/2011     Diagnostic Studies/Procedures    10/19/13 PV Angiogram/Intervention  History obtained from chart review.David [name] is a80 year old married Caucasian male father of 3 children is retired Tree surgeon for a company. He was initially patient of Dr. Rush Landmark Gamble's followed by Dr. Terance Ice and now Dr. [name]. He has a history of peripheral arterial disease with known moderate right internal carotid artery stenosis but with ultrasound as well as an occluded left popliteal artery by angiography back in 2009. He has geniculate collaterals with three-vessel runoff and lifestyle limiting claudication. His other problems include a history of hypertension and hyperlipidemia. He had coronary bypass grafting 09/10/88 and has had 3 stent procedures since most recently by Dr. Ellouise Newer on 03/04/13 which time he placed a drug-eluting stent in the obtuse marginal vein graft. He denies chest pain or shortness of breath. He is currently displaying chronic rehabilitation. Because of ongoing claudication left greater than right as well as advances in technology it was decided to attempt percutaneous revascularization of the left popliteal chronic total occlusion.I attempted this on 08/31/2013 unsuccessfully. The patient is returns in followup of left greater than right lower extremity lifestyle limiting claudication. The decision was to attempt percutaneous transposition of the right popliteal artery and then potentially refer him to Dr. Brunetta Jeans at [facility] in [place] for attempt at tibial pedal access retrograde recanalization of the left popliteal artery  PROCEDURE DESCRIPTION:  The patient was brought to the second floor [place] cath lab in the postabsorptive state. He was premedicated with Valium 5 mg by mouth and IV Versed and fentanyl. His left groinwas prepped and shaved in usual sterile fashion. Xylocaine 1% was used for local anesthesia. A 5 French sheath was inserted into the left common femoral artery using standard Seldinger  technique.contralateral access was obtained with a 5 [place] crossover catheter, 0.35 Versed for wire and a 7 French/55 cm Ansel Sheath. Visipaque allergies for the entirety of the case (155 cc the patient total), retrograde aortic pressure was monitored during the case.  HEMODYNAMICS:  AO SYSTOLIC/AO DIASTOLIC: 151/76  Angiographic Data:  The patient received 10,000 units of heparin intravenously with an ACT of 210. I was able to cross the highly calcified high-grade popliteal lesion with an 0.35 angled Navicross endhole catheter and a 0.35 angled Glidewire. I then exchanged for a 0.14/300 cm length viper wire. I performed diamondback orbital rotational atherectomy with a 2 mm Stealth Burr up to 120,000 rpm's. This resulted in significant debulking of the highly calcified segment. Prior to performing atherectomy I placed a NAV 6 filter for distal protection just above the takeoff of the anterior tibial artery. I then performed into possibly with a 6 mm x 40 mm long chocolate balloon upgrading to a 7 mm x 60 mm long sterling balloon and finally stenting with a 6.5 mm x 60 mm long IDEV self-expanding stent. Copious amount of intra-arterial NTG was given during atherectomy. Completion angiography was then performed revealing reduction of a 99% calcified popliteal artery stenosis to 0% residual with excellent flow. The sheath was then withdrawn across the bifurcation  exchanged over a 0.35 wire for a short 7 [place] sheath.  IMPRESSION:successful diamondback orbital rotational atherectomy, PTA and stenting using an IDEV stent for high-grade calcified right popliteal artery stenosis in the setting of lifestyle limiting claudication. The patient is already on dual antibiotic therapy. The sheath will be removed once the ACT falls below 170 and he will be hydrated overnight, remain recumbent for 6 hours. He will be discharged home in the morning and will get lower extremity arterial Doppler studies in our [facility]  office after which he will see me back.   Discharge Medications     Medication List         acetaminophen 325 MG tablet  Commonly known as:  TYLENOL  Take 2 tablets (650 mg total) by mouth every 4 (four) hours as needed for pain or fever.     aspirin EC 81 MG tablet  Take 81 mg by mouth at bedtime.     bimatoprost 0.01 % Soln  Commonly known as:  LUMIGAN  Place 1 drop into both eyes at bedtime.     carvedilol 12.5 MG tablet  Commonly known as:  COREG  Take 12.5 mg by mouth 2 (two) times daily with a meal.     cetirizine 10 MG tablet  Commonly known as:  ZYRTEC  Take 10 mg by mouth daily as needed for allergies.     cilostazol 100 MG tablet  Commonly known as:  PLETAL  Take 100 mg by mouth 2 (two) times daily.     clopidogrel 75 MG tablet  Commonly known as:  PLAVIX  Take 1 tablet (75 mg total) by mouth daily.     Glucosamine-Chondroitin 750-600 MG Tabs  Take 1 tablet by mouth 2 (two) times daily.     lisinopril 20 MG tablet  Commonly known as:  PRINIVIL,ZESTRIL  Take 20 mg by mouth daily.     nitroGLYCERIN 0.4 MG SL tablet  Commonly known as:  NITROSTAT  Place 1 tablet (0.4 mg total) under the tongue every 5 (five) minutes as needed for chest pain.     omega-3 acid ethyl esters 1 G capsule  Commonly known as:  LOVAZA  Take 2 g by mouth 2 (two) times daily.     pantoprazole 40 MG tablet  Commonly known as:  PROTONIX  Take 1 tablet (40 mg total) by mouth daily.     rosuvastatin 40 MG tablet  Commonly known as:  CRESTOR  Take 1 tablet (40 mg total) by mouth at bedtime.     SENIOR MULTIVITAMIN PLUS PO  Take 1 tablet by mouth daily.        Disposition   The patient will be discharged in stable condition to home.  Future Appointments Provider Department Dept Phone   10/28/2013 12:30 PM [facility] [address] [id number]   11/04/2013 9:00 AM [facility] [address]  [id number]   12/18/2013 9:00 AM Sanda Klein, MD [facility] [id number]   04/16/2014 9:15 AM [facility] [phone]   04/16/2014 9:45 AM Ladell Pier, MD [facility] [phone]     Follow-up Information   Follow up with Lorretta Harp, MD On 11/19/2013. (The office will call  you to schedule an appointment with Dr. Gwenlyn Found in [place] after your ultrasounds of your legs have been completed. If you do not hear from them in the next couple  days please call them yourself. )    Specialty:  Cardiology   Contact information:   [address] [place] [address] [phone]         Duration of Discharge Encounter: Greater than 30 minutes including physician and PA time.  SignedPerry Mount PA-C 10/21/2013, 1:59 PM  I saw examined the patient on the day of discharge. He had extensive ecchymosis with a mild hematoma, and confirmed small residual pseudoaneurysm with one of 3 lobes remaining open. He was able ambulate without too much difficulty. His hemoglobin was stable.  I discussed with Dr. Gwenlyn Found, he felt the patient should be stable for discharge the plan followup LEA Dopplers along with left femoral arterial Doppler next week prior to followup with Dr. Gwenlyn Found or PA.  Leonie Man, M.D., M.S. Interventional Cardiologist  [place] Pager # [phone] 10/21/2013

## 2013-10-21 NOTE — Progress Notes (Signed)
Patient Name: David [name] Date of Encounter: 10/21/2013     Active Problems:   Claudication in peripheral vascular disease    SUBJECTIVE  NO CP or SOB. Has had issues with pseudoaneurysms before with percutaneous procedures but never this bad. No lightheadedness or dizziness. No abdominal pain, n/v. He has been up moving around very minimally.    CURRENT MEDS . aspirin EC  81 mg Oral Daily  . carvedilol  12.5 mg Oral BID WC  . cilostazol  100 mg Oral BID  . clopidogrel  75 mg Oral Q breakfast  . hydrALAZINE  10 mg Intravenous UD  . loratadine  10 mg Oral Daily  . pantoprazole  40 mg Oral Daily    OBJECTIVE  Filed Vitals:   10/20/13 1532 10/20/13 2118 10/21/13 0007 10/21/13 0445  BP: 101/51 113/49 118/44 124/51  Pulse: 61 60 67 63  Temp: 98.1 F (36.7 C) 99 F (37.2 C) 98.4 F (36.9 C) 98.5 F (36.9 C)  TempSrc: Oral Oral Oral Oral  Resp: 18 16 18 20   Height:      Weight:   207 lb 3.7 oz (94 kg)   SpO2: 96% 94% 95% 93%    Intake/Output Summary (Last 24 hours) at 10/21/13 0710 Last data filed at 10/21/13 0446  Gross per 24 hour  Intake 1824.17 ml  Output   1800 ml  Net  24.17 ml   Filed Weights   10/19/13 0837 10/20/13 0003 10/21/13 0007  Weight: 205 lb (92.987 kg) 206 lb 2.1 oz (93.5 kg) 207 lb 3.7 oz (94 kg)    PHYSICAL EXAM  General: Pleasant, NAD. Neuro: Alert and oriented X 3. Moves all extremities spontaneously. Psych: Normal affect. HEENT:  Normal  Neck: Supple without bruits or JVD. Lungs:  Resp regular and unlabored, CTA. Heart: RRR no s3, s4, or murmurs. Abdomen: Soft, non-tender, non-distended, BS + x 4.  Extremities: No clubbing, cyanosis or edema. LLL slightly cooler than R. L inguinal hematoma and swelling. LLE pulse 1+, R 2+  Accessory Clinical Findings  CBC  Recent Labs  10/20/13 1352 10/21/13 0440  WBC 5.2 4.9  HGB 11.3* 9.9*  HCT 32.1* 28.4*  MCV 90.9 90.2  PLT 115* 182*   Basic Metabolic Panel  Recent Labs  10/20/13 0757 10/21/13 0440  NA 140 141  K 3.9 4.0  CL 103 105  CO2 23 21  GLUCOSE 115* 108*  BUN 20 22  CREATININE 1.11 1.18  CALCIUM 8.6 8.3*    TELE  A and V paced. Few PVCs  Radiology/Studies  No results found.  ASSESSMENT AND PLAN David [name] is a 80 y.o. male with a history of CAD s/p CABG and PCI, carotid artery disease, HTN, HLD, and PVD who presented on 10/19/13 for elective percutaneous transposition of the right popliteal artery with Dr. Gwenlyn Found.  PVD- s/p :successful diamondback orbital rotational atherectomy, PTA and stenting using an IDEV stent for high-grade calcified right popliteal artery stenosis in the setting of lifestyle limiting claudication.  --Continue DAPT w/ ASA/Plavix  Multi-chambered pseudoaneurysm- 3 chambers: two have thrombosed and the third is small with a small short neck. Unable to visualize the native vessel due to bruising. Hopeful that 3rd chamber will spontaneously close. Repeat duplex this AM. -- H/H. Hgb 13.3->10.9->11.3->9.9. Will continue to follow CBC. Repeat H/H ~2pm -- Plt 130->110->115.>101  Hypotension during sheath pull ? vagal response. Soft BPs, given IVF and Lisnopril held with improvement in BPs. BP stable will resume lisinopril  with close monitoring.   CAD s/p prior CABG, PCI x3 with DES to obtuse marg vein graft- stable. No chest pain     Signed, Perry Mount PA-C  Pager [phone]

## 2013-10-21 NOTE — Discharge Instructions (Signed)

## 2013-10-22 DIAGNOSIS — T81718A Complication of other artery following a procedure, not elsewhere classified, initial encounter: Secondary | ICD-10-CM | POA: Diagnosis not present

## 2013-10-22 DIAGNOSIS — I729 Aneurysm of unspecified site: Secondary | ICD-10-CM | POA: Diagnosis not present

## 2013-10-28 ENCOUNTER — Ambulatory Visit (HOSPITAL_COMMUNITY)
Admission: RE | Admit: 2013-10-28 | Discharge: 2013-10-28 | Disposition: A | Discharge status: Home / Self Care | Payer: Medicare Other | Source: Ambulatory Visit | Attending: Cardiovascular Disease | Admitting: Cardiovascular Disease

## 2013-10-28 DIAGNOSIS — I739 Peripheral vascular disease, unspecified: Secondary | ICD-10-CM

## 2013-10-28 NOTE — Progress Notes (Signed)
Left Groin Pseudoaneurysm Duplex Completed. Minimal flow visualized, existing pseudoaneurysm is almost completely closed. [place]

## 2013-10-29 DIAGNOSIS — H40019 Open angle with borderline findings, low risk, unspecified eye: Secondary | ICD-10-CM | POA: Diagnosis not present

## 2013-10-29 DIAGNOSIS — H40059 Ocular hypertension, unspecified eye: Secondary | ICD-10-CM | POA: Diagnosis not present

## 2013-10-29 DIAGNOSIS — H259 Unspecified age-related cataract: Secondary | ICD-10-CM | POA: Diagnosis not present

## 2013-11-04 ENCOUNTER — Ambulatory Visit (HOSPITAL_COMMUNITY)
Admission: RE | Admit: 2013-11-04 | Discharge: 2013-11-04 | Disposition: A | Discharge status: Home / Self Care | Payer: Medicare Other | Source: Ambulatory Visit | Attending: Cardiovascular Disease | Admitting: Cardiovascular Disease

## 2013-11-04 ENCOUNTER — Other Ambulatory Visit (HOSPITAL_COMMUNITY): Payer: Self-pay | Admitting: Cardiovascular Disease

## 2013-11-04 DIAGNOSIS — I729 Aneurysm of unspecified site: Secondary | ICD-10-CM

## 2013-11-04 DIAGNOSIS — I739 Peripheral vascular disease, unspecified: Secondary | ICD-10-CM | POA: Diagnosis not present

## 2013-11-04 NOTE — Progress Notes (Signed)
Left Lower Extremity Groin Duplex Completed. [place]

## 2013-11-10 ENCOUNTER — Ambulatory Visit (INDEPENDENT_AMBULATORY_CARE_PROVIDER_SITE_OTHER): Payer: Medicare Other | Admitting: Cardiology

## 2013-11-10 ENCOUNTER — Encounter: Payer: Self-pay | Admitting: Cardiology

## 2013-11-10 VITALS — BP 122/70 | HR 60 | Ht 69.0 in | Wt 207.4 lb

## 2013-11-10 DIAGNOSIS — I442 Atrioventricular block, complete: Secondary | ICD-10-CM

## 2013-11-10 DIAGNOSIS — I998 Other disorder of circulatory system: Secondary | ICD-10-CM | POA: Diagnosis not present

## 2013-11-10 DIAGNOSIS — I739 Peripheral vascular disease, unspecified: Secondary | ICD-10-CM | POA: Diagnosis not present

## 2013-11-10 DIAGNOSIS — I729 Aneurysm of unspecified site: Secondary | ICD-10-CM

## 2013-11-10 DIAGNOSIS — Z9889 Other specified postprocedural states: Secondary | ICD-10-CM | POA: Diagnosis not present

## 2013-11-10 DIAGNOSIS — I25810 Atherosclerosis of coronary artery bypass graft(s) without angina pectoris: Secondary | ICD-10-CM

## 2013-11-10 DIAGNOSIS — I2589 Other forms of chronic ischemic heart disease: Secondary | ICD-10-CM

## 2013-11-10 DIAGNOSIS — Z959 Presence of cardiac and vascular implant and graft, unspecified: Secondary | ICD-10-CM

## 2013-11-10 DIAGNOSIS — T81718A Complication of other artery following a procedure, not elsewhere classified, initial encounter: Secondary | ICD-10-CM

## 2013-11-10 NOTE — Progress Notes (Signed)
11/12/2013   PCP: Mayra Neer, MD   Chief Complaint  Patient presents with  . Follow-up    PV Angio stent in right leg. C/o leg pain.    Primary Cardiologist: Dr. [name] PV: Dr. Gwenlyn Found  HPI: 80 year old white married male followed by cardiology Dr. Olevia Perches /peripheral vascular disease: Dr. Gwenlyn Found. Has a history of peripheral arterial disease with known moderate right internal carotid artery stenosis.  He also has an occluded left popliteal artery by angiography in 2009.  He had collaterals with three-vessel runoff and continues with lifestyle limiting claudication.  Due to ongoing claudication of left greater than right and advanced into allergy it was decided to attempt percutaneous revascularization of the left popliteal chronic total occlusion. This was attempted in January of this year but was unsuccessful. On March 16 he returned to the [facility] lab for attempted percutaneous  revascularization of the right popliteal artery, and then successful diamondback orbital rotational atherectomy PTA and stent using an IDEV stent for high-grade calcified right popliteal artery stenosi potentially refer him to Dr. Brunetta Jeans at [facility] in [place] for attempt at tibial pedal access retrograde recanalization of the left popliteal artery.    On March 16 the patient underwent s in the setting of lifestyle limiting claudication patient is on dual anti-platelet therapy.  Post procedure he did develop a left inguinal multichambered pseudoaneurysm.  Follow up in hospital Doppler revealed 2 thrombosed pseudoaneurysms the third was small with a small short. Patient was discharged and has had followup here in the office April 1 with continued hematoma but resolution of left groin pseudoaneurysm.  His followup lower extremity arterial Dopplers of the right leg have not yet been done.  Patient is here for continued pain in his left leg and inability to walk very far due to claudication symptoms.  He wanted to go ahead and arrange referral to Dr. Andree Elk at [facility].  He can only walk approximately 10 minutes without having to stop due to claudication.    He is also being seen for continued pain in the left flank at the site of the hematoma. He has ecchymosis on the left lateral thigh most likely due to the controls on the Cath Lab table.    His other problems include a history of hypertension and hyperlipidemia. He had coronary bypass grafting 09/10/88 and has had 3 stent procedures since most recently by Dr. Ellouise Newer on 03/04/13 which time he placed a drug-eluting stent in the obtuse marginal vein graft. He denies chest pain or shortness of breath      Allergies  Allergen Reactions  . Azithromycin Diarrhea  . Lactose Intolerance (Gi)   . Niacin And Related Diarrhea  . Sulfa Antibiotics Rash    Current Outpatient Prescriptions  Medication Sig Dispense Refill  . acetaminophen (TYLENOL) 325 MG tablet Take 2 tablets (650 mg total) by mouth every 4 (four) hours as needed for pain or fever.      Marland Kitchen aspirin EC 81 MG tablet Take 81 mg by mouth at bedtime.      . bimatoprost (LUMIGAN) 0.01 % SOLN Place 1 drop into both eyes at bedtime.       . carvedilol (COREG) 12.5 MG tablet Take 12.5 mg by mouth 2 (two) times daily with a meal.       . cetirizine (ZYRTEC) 10 MG tablet Take 10 mg by mouth daily as needed for allergies.      . cilostazol (  PLETAL) 100 MG tablet Take 100 mg by mouth 2 (two) times daily.      . clopidogrel (PLAVIX) 75 MG tablet Take 1 tablet (75 mg total) by mouth daily.  90 tablet  3  . Glucosamine-Chondroitin 750-600 MG TABS Take 1 tablet by mouth 2 (two) times daily.      Marland Kitchen lisinopril (PRINIVIL,ZESTRIL) 20 MG tablet Take 20 mg by mouth daily.      . Multiple Vitamins-Minerals (SENIOR MULTIVITAMIN PLUS PO) Take 1 tablet by mouth daily.      . nitroGLYCERIN (NITROSTAT) 0.4 MG SL tablet Place 1 tablet (0.4 mg total) under the tongue every 5 (five) minutes as needed for  chest pain.  25 tablet  2  . omega-3 acid ethyl esters (LOVAZA) 1 G capsule Take 2 g by mouth 2 (two) times daily.       . pantoprazole (PROTONIX) 40 MG tablet Take 1 tablet (40 mg total) by mouth daily.  90 tablet  3  . rosuvastatin (CRESTOR) 40 MG tablet Take 1 tablet (40 mg total) by mouth at bedtime.       No current facility-administered medications for this visit.    Past Medical History  Diagnosis Date  . CAD (coronary artery disease)   . Hypertension   . Peripheral vascular disease with claudication   . Hyperlipidemia   . Left ventricular dysfunction   . Complete heart block   . Pacemaker   . Anginal pain   . Myocardial infarction 1989  . Carotid artery disease   . PAD (peripheral artery disease)   . Arthritis     "hands" (10/19/2013)  . Non Hodgkin's lymphoma 06/2010    "excised from left neck then tx'd w/chemo"    Past Surgical History  Procedure Laterality Date  . Anal fissure repair  1990's  . 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 Insert / replace / remove pacemaker  1995; 2002; 2009    [employer]  . Superficial lymph node biopsy / excision Left 2011    "neck; turned out to be non Hodgkins lymphoma"  . Popliteal artery stent Right 10/19/2013  . Cardiac catheterization  1989  . Coronary angioplasty with stent placement  2007; 03/04/2013    ""2 +1"  . Coronary artery bypass graft  09/10/1988    "CABG X5" (03/04/2013) LIMA to LAD,SVG to imtermediate OM1 OM II,SVG to PDA    OEV:OJJKKXF:GH colds or fevers, no weight changes Skin:no rashes or ulcers HEENT:no blurred vision, no congestion CV:see HPI PUL:see HPI GI:no diarrhea constipation or melena, no indigestion GU:no hematuria, no dysuria MS:no joint pain, ++ claudication left leg. Positive bruising of the left leg  Neuro:no syncope, no lightheadedness Endo:no diabetes, no thyroid disease  PHYSICAL EXAM BP 122/70  Pulse 60  Ht 5\' 9"  (1.753 m)  Wt 207 lb 6.4 oz (94.076 kg)   BMI 30.61 kg/m2 General:Pleasant affect, NAD Skin:Warm and dry, brisk capillary refill HEENT:normocephalic, sclera clear, mucus membranes moist Neck:supple, no JVD, no bruits  Heart:S1S2 RRR without murmur, gallup, rub or click Lungs:clear without raIes , rhonchi, or wheezes WEX:HBZJ, non tender, + BS, do not palpate liver spleen or masses Ext:no lower ext edema, left lateral thigh ecchymosis, now green, remnants of bruising across the thigh, 2 cm hematoma at the cath site in the left groin which he states is smaller than previously, I do not hear a bruites  Neuro:alert and oriented, MAE, follows commands, + facial symmetry   ASSESSMENT AND  PLAN Pseudoaneurysm following procedure Pseudoaneurysm has resolved, continues with hematoma and ecchymosis of the left thigh but he was reassured as it is resolving may take another one to 2 months.  PVD - Lt popliteal occlusion with collaterals '09; rt popliteal successful diamondback orbital rotational atherectomy PTA and stent using an IDEV stent 10/21/13 Patient underwent right popliteal successful diamondback orbital rotational atherectomy PTA and stent using an IDEV stent for high-grade calcified right popliteal artery stenosis. We'll schedule right lower extreme arterial Dopplers to evaluate post procedure.  Lt pop. Vessel will be discussed with Dr. Alvester Chou on visit within the next few weeks and possible consultation with Dr. Andree Elk at [facility] for tibial/pedal access retrograde recanalization of the left popliteal artery  CAD s/p CABG 1995, s/p stent SVG-OM 2007 and July 2014 No chest pain  Complete heart block- PTVDP '95 with Gen change '02 and '09 (BS). Pacer dependednt stable  Claudication- unsuccessful PTA attempt Lt popliteal 08/31/13 See above, claudication continues on the lt leg    Pt will follow up in May with Dr. [name]

## 2013-11-10 NOTE — Patient Instructions (Signed)
Have rt leg arterial dopplers.   Follow up with Dr. Gwenlyn Found after doppler to decide further intervention.  Lt froin hematoma will slowly resolve.

## 2013-11-12 NOTE — Assessment & Plan Note (Signed)
Pseudoaneurysm has resolved, continues with hematoma and ecchymosis of the left thigh but he was reassured as it is resolving may take another one to 2 months.

## 2013-11-12 NOTE — Assessment & Plan Note (Signed)
Patient underwent right popliteal successful diamondback orbital rotational atherectomy PTA and stent using an IDEV stent for high-grade calcified right popliteal artery stenosis. We'll schedule right lower extreme arterial Dopplers to evaluate post procedure.  Lt pop. Vessel will be discussed with Dr. Alvester Chou on visit within the next few weeks and possible consultation with [facility]. Andree Elk at [facility] for tibial/pedal access retrograde recanalization of the left popliteal artery

## 2013-11-12 NOTE — Assessment & Plan Note (Signed)
stable °

## 2013-11-12 NOTE — Assessment & Plan Note (Signed)
See above, claudication continues on the lt leg

## 2013-11-12 NOTE — Assessment & Plan Note (Signed)
No chest pain

## 2013-11-13 ENCOUNTER — Ambulatory Visit (HOSPITAL_COMMUNITY)
Admission: RE | Admit: 2013-11-13 | Discharge: 2013-11-13 | Disposition: A | Discharge status: Home / Self Care | Payer: Medicare Other | Source: Ambulatory Visit | Attending: Internal Medicine | Admitting: Internal Medicine

## 2013-11-13 DIAGNOSIS — Z48812 Encounter for surgical aftercare following surgery on the circulatory system: Secondary | ICD-10-CM | POA: Insufficient documentation

## 2013-11-13 DIAGNOSIS — I70219 Atherosclerosis of native arteries of extremities with intermittent claudication, unspecified extremity: Secondary | ICD-10-CM | POA: Diagnosis not present

## 2013-11-13 DIAGNOSIS — I739 Peripheral vascular disease, unspecified: Secondary | ICD-10-CM

## 2013-11-13 DIAGNOSIS — Z959 Presence of cardiac and vascular implant and graft, unspecified: Secondary | ICD-10-CM

## 2013-11-13 NOTE — Progress Notes (Signed)
Right Lower Ext. Arterial Duplex Completed. Kalifa Cadden, BS, RDMS, RVT  

## 2013-11-25 NOTE — Telephone Encounter (Signed)
Encounter has been closed--TP 11/25/13 

## 2013-12-01 ENCOUNTER — Ambulatory Visit (INDEPENDENT_AMBULATORY_CARE_PROVIDER_SITE_OTHER): Payer: Medicare Other | Admitting: Cardiovascular Disease

## 2013-12-01 ENCOUNTER — Telehealth: Payer: Self-pay | Admitting: Cardiovascular Disease

## 2013-12-01 ENCOUNTER — Encounter: Payer: Self-pay | Admitting: Cardiovascular Disease

## 2013-12-01 VITALS — BP 156/77 | HR 62 | Ht 69.5 in | Wt 208.3 lb

## 2013-12-01 DIAGNOSIS — I739 Peripheral vascular disease, unspecified: Secondary | ICD-10-CM

## 2013-12-01 DIAGNOSIS — I2589 Other forms of chronic ischemic heart disease: Secondary | ICD-10-CM | POA: Diagnosis not present

## 2013-12-01 NOTE — Progress Notes (Signed)
12/01/2013 David [name]   11-19-1932  [id number]  Primary Physician David Neer, MD Primary Cardiologist: Lorretta Harp MD Renae Gloss   HPI:  [name] is a47 year old married Caucasian male father of 3 children is retired Tree surgeon for a company. He was initially patient of Dr. Rush Landmark [name]'s followed by Dr. Terance [name] and now Dr. Sallyanne [name]. He has a history of peripheral arterial disease with known moderate right internal carotid artery stenosis but with ultrasound as well as an occluded left popliteal artery by angiography back in 2009. He has geniculate collaterals with three-vessel runoff and lifestyle limiting claudication. His other problems include a history of hypertension and hyperlipidemia. He had coronary bypass grafting 09/10/88 and has had 3 stent procedures since most recently by Dr. Ellouise [name] on 03/04/13 which time he placed a drug-eluting stent in the obtuse marginal vein graft. He denies chest pain or shortness of breath. He is currently displaying chronic rehabilitation. Because of ongoing claudication left greater than right as well as advances in technology it was decided to attempt percutaneous revascularization of the left popliteal chronic total occlusion.I attempted this on 08/31/2013 unsuccessfully. I then performed staged right SFA diamondback orbital rotational atherectomy, PT and stenting using an IDEV stent with excellent angiographic result. Followup Dopplers performed on 11/13/13 revealed an ABI 1.1 on the right. The procedure was complicated by a left common femoral pseudoaneurysm which was compressed and ultimately closed confirmed by ultrasound.   Current Outpatient Prescriptions  Medication Sig Dispense Refill  . acetaminophen (TYLENOL) 325 MG tablet Take 2 tablets (650 mg total) by mouth every 4 (four) hours as needed for pain or fever.      Marland Kitchen aspirin EC 81 MG tablet Take 81 mg by mouth at bedtime.      . bimatoprost (LUMIGAN) 0.01 %  SOLN Place 1 drop into both eyes at bedtime.       . carvedilol (COREG) 12.5 MG tablet Take 12.5 mg by mouth 2 (two) times daily with a meal.       . cetirizine (ZYRTEC) 10 MG tablet Take 10 mg by mouth daily as needed for allergies.      . cilostazol (PLETAL) 100 MG tablet Take 100 mg by mouth 2 (two) times daily.      . clopidogrel (PLAVIX) 75 MG tablet Take 1 tablet (75 mg total) by mouth daily.  90 tablet  3  . Glucosamine-Chondroitin 750-600 MG TABS Take 1 tablet by mouth 2 (two) times daily.      Marland Kitchen lisinopril (PRINIVIL,ZESTRIL) 20 MG tablet Take 20 mg by mouth daily.      . Multiple Vitamins-Minerals (SENIOR MULTIVITAMIN PLUS PO) Take 1 tablet by mouth daily.      . nitroGLYCERIN (NITROSTAT) 0.4 MG SL tablet Place 1 tablet (0.4 mg total) under the tongue every 5 (five) minutes as needed for chest pain.  25 tablet  2  . omega-3 acid ethyl esters (LOVAZA) 1 G capsule Take 2 g by mouth 2 (two) times daily.       . pantoprazole (PROTONIX) 40 MG tablet Take 1 tablet (40 mg total) by mouth daily.  90 tablet  3  . rosuvastatin (CRESTOR) 40 MG tablet Take 1 tablet (40 mg total) by mouth at bedtime.       No current facility-administered medications for this visit.    Allergies  Allergen Reactions  . Azithromycin Diarrhea  . Lactose Intolerance (Gi)   . Niacin And Related Diarrhea  . Sulfa  Antibiotics Rash    History   Social History  . Marital Status: Married    Spouse Name: N/A    Number of Children: N/A  . Years of Education: N/A   Occupational History  . Not on file.   Social History Main Topics  . Smoking status: Former Smoker -- 0.12 packs/day for 1 years    Types: Cigarettes    Quit date: 08/06/1956  . Smokeless tobacco: Never Used  . Alcohol Use: 1.2 oz/week    2 Glasses of wine per week  . Drug Use: No  . Sexual Activity: No   Other Topics Concern  . Not on file   Social History Narrative  . No narrative on file     Review of Systems: General: negative for  chills, fever, night sweats or weight changes.  Cardiovascular: negative for chest pain, dyspnea on exertion, edema, orthopnea, palpitations, paroxysmal nocturnal dyspnea or shortness of breath Dermatological: negative for rash Respiratory: negative for cough or wheezing Urologic: negative for hematuria Abdominal: negative for nausea, vomiting, diarrhea, bright red blood per rectum, melena, or hematemesis Neurologic: negative for visual changes, syncope, or dizziness All other systems reviewed and are otherwise negative except as noted above.    Blood pressure 156/77, pulse 62, height 5' 9.5" (1.765 m), weight 208 lb 4.8 oz (94.484 kg).  General appearance: alert and no distress Neck: no adenopathy, no JVD, supple, symmetrical, trachea midline, thyroid not enlarged, symmetric, no tenderness/mass/nodules and soft right carotid bruit Lungs: clear to auscultation bilaterally Heart: regular rate and rhythm, S1, S2 normal, no murmur, click, rub or gallop Extremities: extremities normal, atraumatic, no cyanosis or edema and well-healed left common femoral arterial puncture site, 2+ right pedal pulse  EKG not performed today  ASSESSMENT AND PLAN:   PVD - Lt popliteal occlusion with collaterals '09; rt popliteal successful diamondback orbital rotational atherectomy PTA and stent using an IDEV stent 10/21/13 Post failed attempt at percutaneous recatheterization of a left popliteal calcified chronic total occlusion. He has staged successful right popliteal diamondback atherectomy, PTA and stent procedure on 10/19/13 reducing 95% stenosis to 0% residual. His followup Dopplers performed on 11/13/13 showed a right ABI 1.1 with excellent velocities. He is yet to walk enough to know significant however. I may refer him to Dr. Brunetta [name] at [facility] for consideration of tibial pedal access.      Lorretta Harp MD FACP,FACC,FAHA, [place] 12/01/2013 2:00 PM

## 2013-12-01 NOTE — Patient Instructions (Signed)
Your physician wants you to follow-up in: 6 months with Dr Gwenlyn Found. You will receive a reminder letter in the mail two months in advance. If you don't receive a letter, please call our office to schedule the follow-up appointment.  Dr Gwenlyn Found has referred you to Dr Brunetta Jeans in [place] for your peripheral vascular disease.

## 2013-12-01 NOTE — Assessment & Plan Note (Signed)
Post failed attempt at percutaneous recatheterization of a left popliteal calcified chronic total occlusion. He has staged successful right popliteal diamondback atherectomy, PTA and stent procedure on 10/19/13 reducing 95% stenosis to 0% residual. His followup Dopplers performed on 11/13/13 showed a right ABI 1.1 with excellent velocities. He is yet to walk enough to know significant however. I may refer him to Dr. Brunetta Jeans at [facility] for consideration of tibial pedal access.

## 2013-12-01 NOTE — Telephone Encounter (Signed)
Faxed records to [facility] - Dr. Brunetta Jeans.

## 2013-12-14 ENCOUNTER — Other Ambulatory Visit: Payer: Self-pay

## 2013-12-14 MED ORDER — ROSUVASTATIN CALCIUM 40 MG PO TABS: 40.0000 mg | ORAL_TABLET | Freq: Every day | ORAL | Status: DC

## 2013-12-14 NOTE — Telephone Encounter (Signed)
Rx was sent to pharmacy electronically. 

## 2013-12-18 ENCOUNTER — Ambulatory Visit (INDEPENDENT_AMBULATORY_CARE_PROVIDER_SITE_OTHER): Payer: Medicare Other | Admitting: Cardiovascular Disease

## 2013-12-18 ENCOUNTER — Encounter: Payer: Self-pay | Admitting: Cardiovascular Disease

## 2013-12-18 VITALS — BP 150/79 | HR 61 | Resp 18 | Ht 69.0 in | Wt 207.7 lb

## 2013-12-18 DIAGNOSIS — I6529 Occlusion and stenosis of unspecified carotid artery: Secondary | ICD-10-CM | POA: Diagnosis not present

## 2013-12-18 LAB — MDC_IDC_ENUM_SESS_TYPE_INCLINIC
Brady Statistic AP VP Percent: 98 %
Brady Statistic AP VS Percent: 0 %
Brady Statistic AS VS Percent: 0 %
Implantable Pulse Generator Serial Number: 594681
MDC IDC STAT BRADY AS VP PERCENT: 2 %

## 2013-12-18 NOTE — Patient Instructions (Signed)
Your physician has requested that you have a carotid duplex after February 10, 2014. This test is an ultrasound of the carotid arteries in your neck. It looks at blood flow through these arteries that supply the brain with blood. Allow one hour for this exam. There are no restrictions or special instructions.  In Office Pacemaker check in 3 months at [facility].  Dr. [name] recommends that you schedule a follow-up appointment in: 6 months with a pacemaker check.

## 2013-12-20 ENCOUNTER — Encounter: Payer: Self-pay | Admitting: Cardiovascular Disease

## 2013-12-20 NOTE — Progress Notes (Signed)
Patient ID: David [name], male   DOB: 1933-05-25, 80 y.o.   MRN: [id number]      Reason for office visit [facility] - pacemaker f/u, CAD, PAD  David [name] returns roughly 9 months status post basement of a stent in the saphenous vein graft to the oblique marginal artery. He is now almost 20 years status post coronary bypass surgery. The same bypass graft had received a stent in 2007. He has mild ischemic cardiomyopathy with an ejection fraction of 40-45%, without clinically evident congestive heart failure. He completed cardiac rehabilitation, but the claudication was a limiting factor.   Activities are mostly limited by pain in his left calf and he is known to have occlusion of the popliteal artery in that limb with distal reconstitution via collaterals. Attempt at recanalization via the femoral approach was not successful. He is scheduled to see Dr. Andree Elk to discuss another attempt from a distal approach.  The nuclear stress test on May 01, 2013 shows resolution of the previously reversible inferolateral ischemic defect and no change in the previously described scar in the apex and inferior wall. LVEF is calculated at 44%.   He is pacemaker dependent secondary to complete heart block and his dual chamber Actor (gen change 2009) is likely to reach ERI in the next 18 months. Note that both his atrial and ventricular leads were placed in 1995 and are unipolar. (Atrial model 438-05, ventricular model 430-07). Interrogation of his device today again shows some issues with "noise" on the atrial channel but none on the ventricular channel. Overall device function is normal and he has virtually 100% atrial pacing as well as 100% ventricular pacing.   Mild carotid artery disease (50-69% proximal internal carotid artery stenosis on the right, mild plaque on the left), last duplex US was February 10, 2013.  He has a history of non-Hodgkin's lymphoma with a large neck mass but also mediastinal and  abdominal lymphadenopathy treated with radiotherapy to the cervical area in 2011 followed by chemotherapy with Rituxan and fludarabine. He is considered to be in remission.    Allergies  Allergen Reactions  . Azithromycin Diarrhea  . Lactose Intolerance (Gi)   . Niacin And Related Diarrhea  . Sulfa Antibiotics Rash    Current Outpatient Prescriptions  Medication Sig Dispense Refill  . acetaminophen (TYLENOL) 325 MG tablet Take 2 tablets (650 mg total) by mouth every 4 (four) hours as needed for pain or fever.      Marland Kitchen aspirin EC 81 MG tablet Take 81 mg by mouth at bedtime.      . bimatoprost (LUMIGAN) 0.01 % SOLN Place 1 drop into both eyes at bedtime.       . carvedilol (COREG) 12.5 MG tablet Take 12.5 mg by mouth 2 (two) times daily with a meal.       . cetirizine (ZYRTEC) 10 MG tablet Take 10 mg by mouth daily as needed for allergies.      . cilostazol (PLETAL) 100 MG tablet Take 100 mg by mouth 2 (two) times daily.      . clopidogrel (PLAVIX) 75 MG tablet Take 1 tablet (75 mg total) by mouth daily.  90 tablet  3  . Glucosamine-Chondroitin 750-600 MG TABS Take 1 tablet by mouth 2 (two) times daily.      Marland Kitchen lisinopril (PRINIVIL,ZESTRIL) 20 MG tablet Take 20 mg by mouth daily.      . Multiple Vitamins-Minerals (SENIOR MULTIVITAMIN PLUS PO) Take 1 tablet by mouth daily.      Marland Kitchen  nitroGLYCERIN (NITROSTAT) 0.4 MG SL tablet Place 1 tablet (0.4 mg total) under the tongue every 5 (five) minutes as needed for chest pain.  25 tablet  2  . omega-3 acid ethyl esters (LOVAZA) 1 G capsule Take 2 g by mouth 2 (two) times daily.       . pantoprazole (PROTONIX) 40 MG tablet Take 1 tablet (40 mg total) by mouth daily.  90 tablet  3  . rosuvastatin (CRESTOR) 40 MG tablet Take 1 tablet (40 mg total) by mouth at bedtime.  90 tablet  3   No current facility-administered medications for this visit.    Past Medical History  Diagnosis Date  . CAD (coronary artery disease)   . Hypertension   . Peripheral  vascular disease with claudication   . Hyperlipidemia   . Left ventricular dysfunction   . Complete heart block   . Pacemaker   . Anginal pain   . Myocardial infarction 1989  . Carotid artery disease   . PAD (peripheral artery disease)   . Arthritis     "hands" (10/19/2013)  . Non Hodgkin's lymphoma 06/2010    "excised from left neck then tx'd w/chemo"    Past Surgical History  Procedure Laterality Date  . Anal fissure repair  1990's  . 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 Insert / replace / remove pacemaker  1995; 2002; 2009    [employer]  . Superficial lymph node biopsy / excision Left 2011    "neck; turned out to be non Hodgkins lymphoma"  . Popliteal artery stent Right 10/19/2013  . Cardiac catheterization  1989  . Coronary angioplasty with stent placement  2007; 03/04/2013    ""2 +1"  . Coronary artery bypass graft  09/10/1988    "CABG X5" (03/04/2013) LIMA to LAD,SVG to imtermediate OM1 OM II,SVG to PDA    Family History  Problem Relation Age of Onset  . Diabetes Mother   . Stroke Mother   . Heart attack Father   . Hypertension Sister   . Hyperlipidemia Sister   . Hypertension Sister   . Hyperlipidemia Sister     History   Social History  . Marital Status: Married    Spouse Name: N/A    Number of Children: N/A  . Years of Education: N/A   Occupational History  . Not on file.   Social History Main Topics  . Smoking status: Former Smoker -- 0.12 packs/day for 1 years    Types: Cigarettes    Quit date: 08/06/1956  . Smokeless tobacco: Never Used  . Alcohol Use: 1.2 oz/week    2 Glasses of wine per week  . Drug Use: No  . Sexual Activity: No   Other Topics Concern  . Not on file   Social History Narrative  . No narrative on file    Review of systems: Intermittent claudication left foot, lifestyle limiting - "I'll  Have bypass for it if I have to". The patient specifically denies any chest pain at rest or with  exertion, dyspnea at rest or with exertion, orthopnea, paroxysmal nocturnal dyspnea, syncope, palpitations, focal neurological deficits, lower extremity edema, unexplained weight gain, cough, hemoptysis or wheezing.  The patient also denies abdominal pain, nausea, vomiting, dysphagia, diarrhea, constipation, polyuria, polydipsia, dysuria, hematuria, frequency, urgency, abnormal bleeding or bruising, fever, chills, unexpected weight changes, mood swings, change in skin or hair texture, change in voice quality, auditory or visual problems, allergic reactions or rashes, new musculoskeletal  complaints other than usual "aches and pains".   PHYSICAL EXAM BP 150/79  Pulse 61  Resp 18  Ht 5\' 9"  (1.753 m)  Wt 207 lb 11.2 oz (94.212 kg)  BMI 30.66 kg/m2 General: Alert, oriented x3, no distress  Head: no evidence of trauma, PERRL, EOMI, no exophtalmos or lid lag, no myxedema, no xanthelasma; normal ears, nose and oropharynx  Neck: normal jugular venous pulsations and no hepatojugular reflux; brisk carotid pulses without delay and bilateral carotid bruits  Chest: clear to auscultation, no signs of consolidation by percussion or palpation, normal fremitus, symmetrical and full respiratory excursions; healed sternotomy and pacemaker scars  Cardiovascular: normal position and quality of the apical impulse, regular rhythm, normal first and paradoxically split second heart sounds, no murmurs, rubs or gallops  Abdomen: no tenderness or distention, no masses by palpation, no abnormal pulsatility or arterial bruits, normal bowel sounds, no hepatosplenomegaly  Extremities: no clubbing, cyanosis or edema; 2+ radial, ulnar and brachial pulses bilaterally; 2+ right femoral, posterior tibial and dorsalis pedis pulses; 2+ left femoral, posterior tibial and dorsalis pedis pulses; no subclavian or femoral bruits  Neurological: grossly nonfocal   EKG: 100% atrioventricular sequential pacing   Lipid Panel  10/14/2013  Chol 123, TG 183, HDL 37, LDL 50 Creat 1.22, Hgb 13.6  BMET    Component Value Date/Time   NA 141 10/21/2013 0440   K 4.0 10/21/2013 0440   CL 105 10/21/2013 0440   CO2 21 10/21/2013 0440   GLUCOSE 108* 10/21/2013 0440   BUN 22 10/21/2013 0440   CREATININE 1.18 10/21/2013 0440   CREATININE 1.11 08/25/2013 1145   CALCIUM 8.3* 10/21/2013 0440   GFRNONAA 57* 10/21/2013 0440   GFRAA 66* 10/21/2013 0440     ASSESSMENT AND PLAN Complete heart block- PTVDP '95 with Gen change '02 and '09 (BS). Pacer dependednt  [employer] 2009. He is pacemaker dependent. He has complete heart block but also paces almost 100% of the time the atrium. His leads are unipolar and this is causing some issues with false mode switch due to noise on the atrial channel. The atrial sensitivity is already set fairly high and I don't think we should increase any further since we may miss the opportunity to diagnose atrial fibrillation. The generator will be due for change out in another couple of years and we will discuss whether it is worthwhile putting in a new atrial lead at that time.   CAD s/p CABG 1995, s/p stent SVG-OM 2007 and July 2014  Despite receiving a drug-eluting stent, there is still considerable risk of development of in-stent restenosis in this 80 year-old saphenous vein graft that was initially stented in 2007. At this point in time his angina is gone and his nuclear perfusion study performed in September does not show any major areas of reversible ischemia. Would not be surprising for him to continue to have angina from a variety of small branches that have disease. He is receiving near comprehensive anti-anginal therapy with carvedilol, long-acting nitrates, ranolazine,   Cardiomyopathy, ischemic- EF 40-45% by echo 4/14  Clinically he does not have manifestations of congestive heart. He is on appropriate carvedilol and ACE inhibitor therapy. He does not require diuretic therapy at this time.   Dyslipidemia   Excellent lipid profile except for slightly low HDL. No changes to medications are recommended.   PVD - Lt popliteal occlusion with collaterals '09  Due to undergo an attempt at recanalization with a distal access approach, Dr. Andree Elk.  Asymptomatic stenosis of right carotid artery without infarction  Last had Doppler ultrasonography in July 2014 with a 50-69% proximal ICA stenosis; less than 50% on the left side, antegrade flow in vertebrals bilaterally. Schedule a follow up study.  HTN BP high today, but his ome monitor (which has been validated) usually records BP around 125/65. No changes made.   Orders Placed This Encounter  Procedures  . Doppler carotid   No orders of the defined types were placed in this encounter.    Dorien Mayotte  Sanda Klein, MD, [facility] [phone] office [phone] pager

## 2013-12-23 IMAGING — CR DG CHEST 2V
2 series · 2 of 2 positions shown · non-contrast
Comparison: 02/25/2012

CLINICAL DATA: Preop for cardiac catheterization.  Chest pain and
shortness of breath.

CHEST - 2 VIEW

[w chest pa]
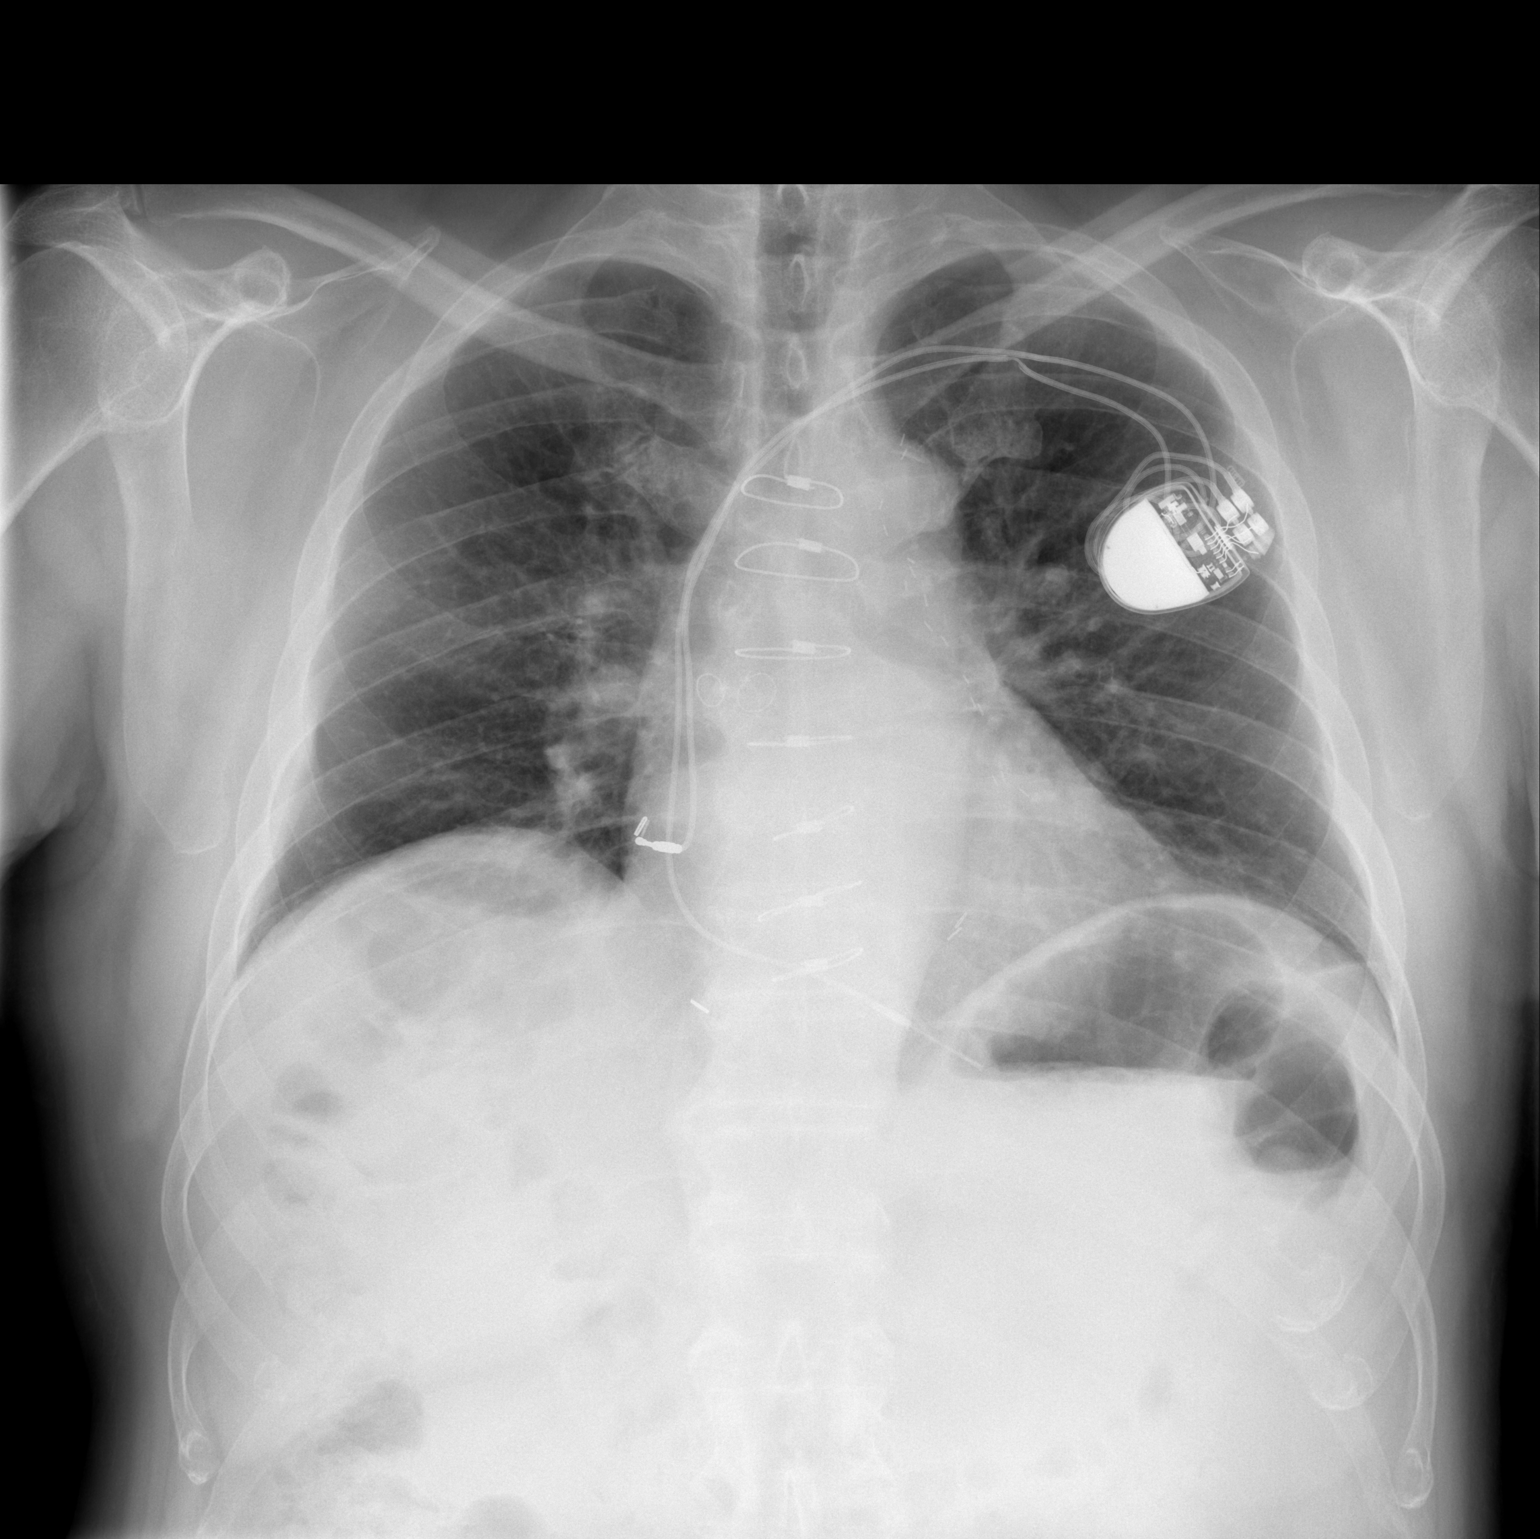

[w chest lat]
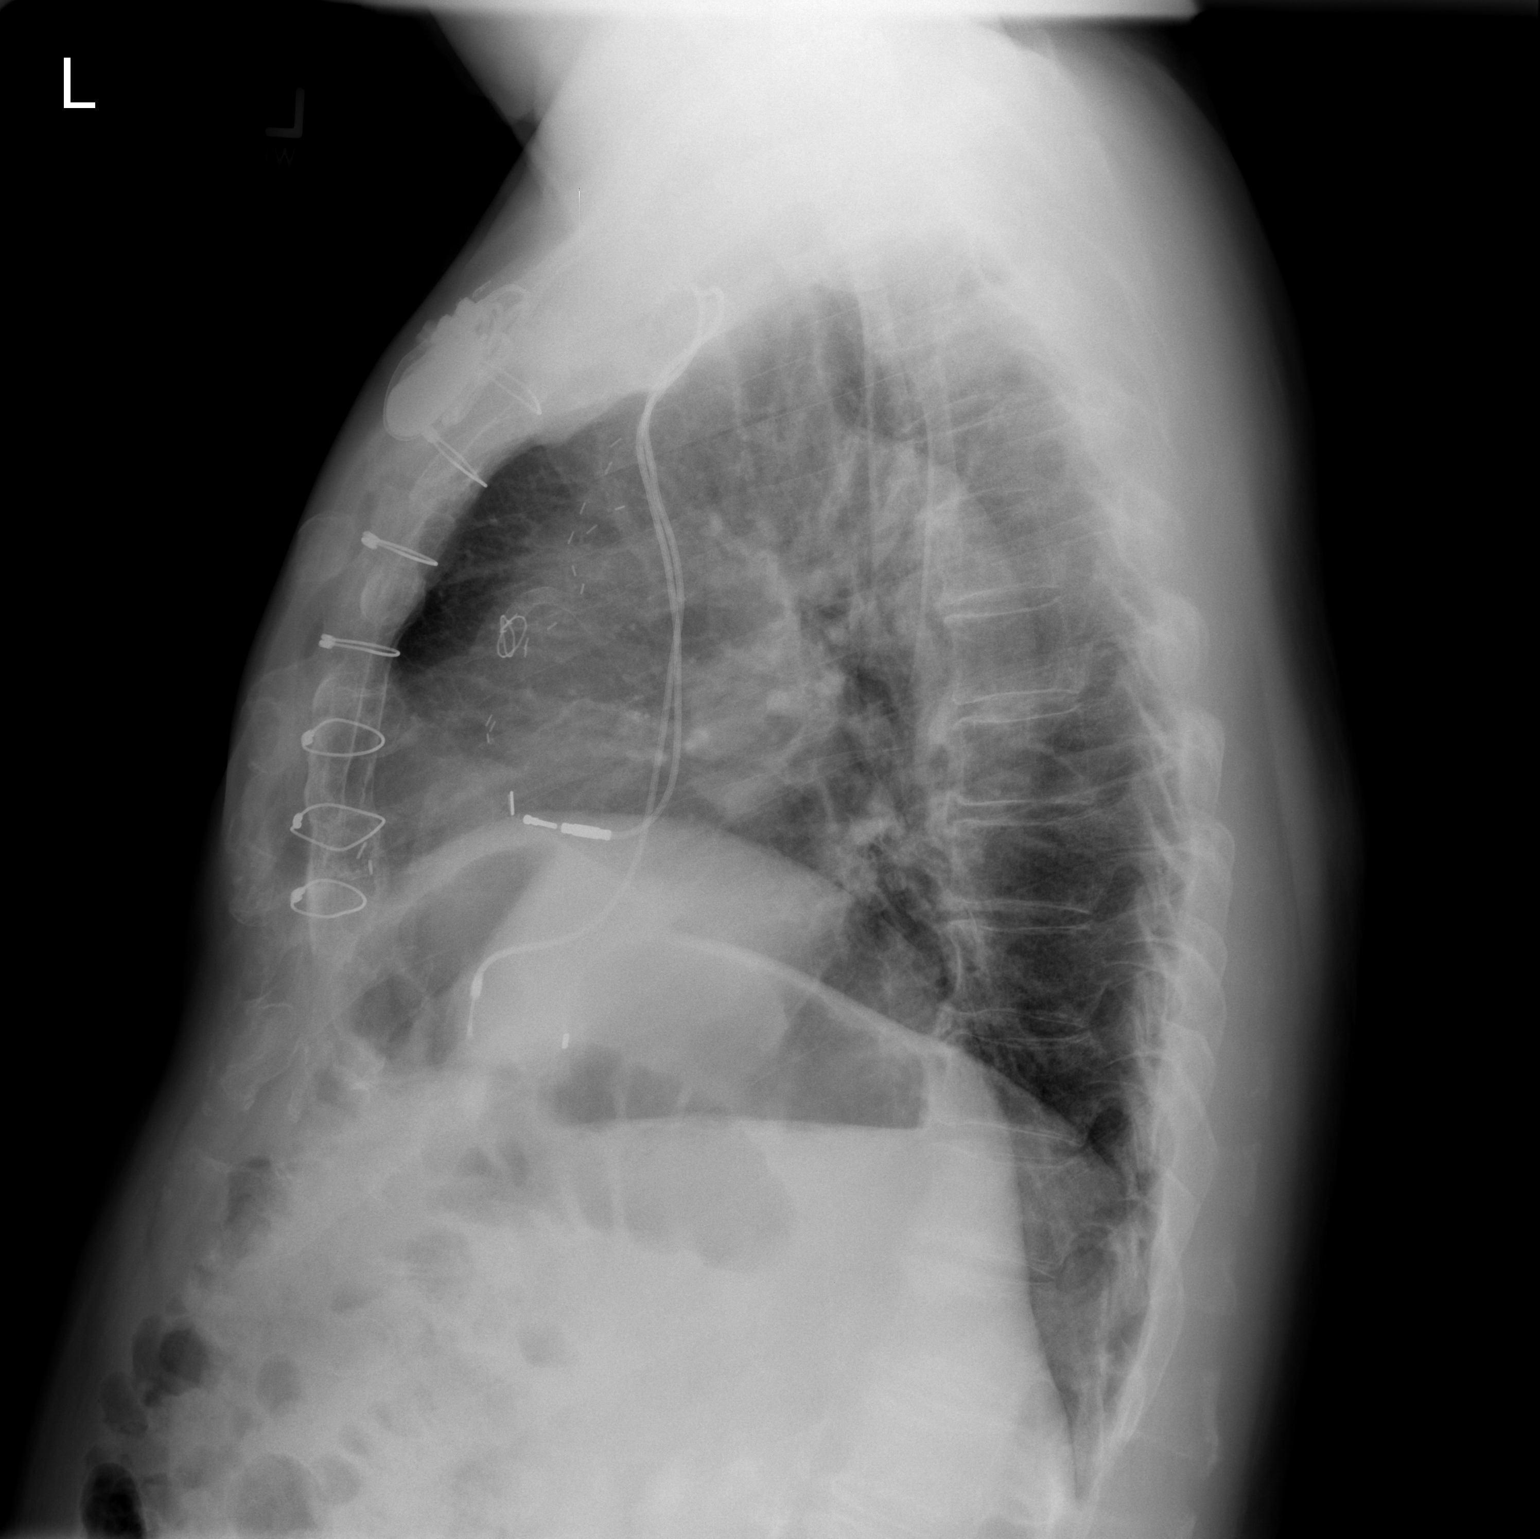

[2 of 2 positions shown; findings below may reference images not displayed]

FINDINGS: Prior median sternotomy.  Dual lead pacer with leads
right atrium right ventricle.  There is irregularity of the antral
pacer.

Mild right hemidiaphragm elevation. Midline trachea.  Normal heart
size and mediastinal contours. No pleural effusion or pneumothorax.
No congestive failure.

Mild atelectasis or scarring at the left lung base.
IMPRESSION: No acute cardiopulmonary disease.

Irregularity of the atrial wire of the dual lead pacer.  Correlate
with pacer function.  Of note, this appears similar back to
10/14/2009.

## 2014-01-11 ENCOUNTER — Other Ambulatory Visit: Payer: Self-pay | Admitting: *Deleted

## 2014-01-11 MED ORDER — OMEGA-3-ACID ETHYL ESTERS 1 G PO CAPS: 2.0000 g | ORAL_CAPSULE | Freq: Two times a day (BID) | ORAL | Status: DC

## 2014-01-11 NOTE — Telephone Encounter (Signed)
Rx was sent to pharmacy electronically. 

## 2014-01-13 DIAGNOSIS — I442 Atrioventricular block, complete: Secondary | ICD-10-CM | POA: Diagnosis not present

## 2014-01-13 DIAGNOSIS — I739 Peripheral vascular disease, unspecified: Secondary | ICD-10-CM | POA: Diagnosis not present

## 2014-01-13 DIAGNOSIS — I2510 Atherosclerotic heart disease of native coronary artery without angina pectoris: Secondary | ICD-10-CM | POA: Diagnosis not present

## 2014-01-13 DIAGNOSIS — C8589 Other specified types of non-Hodgkin lymphoma, extranodal and solid organ sites: Secondary | ICD-10-CM | POA: Diagnosis not present

## 2014-01-18 ENCOUNTER — Telehealth (HOSPITAL_COMMUNITY): Payer: Self-pay | Admitting: *Deleted

## 2014-01-20 DIAGNOSIS — I2589 Other forms of chronic ischemic heart disease: Secondary | ICD-10-CM | POA: Diagnosis not present

## 2014-01-20 DIAGNOSIS — M79609 Pain in unspecified limb: Secondary | ICD-10-CM | POA: Diagnosis not present

## 2014-01-20 DIAGNOSIS — I2510 Atherosclerotic heart disease of native coronary artery without angina pectoris: Secondary | ICD-10-CM | POA: Diagnosis not present

## 2014-01-20 DIAGNOSIS — E785 Hyperlipidemia, unspecified: Secondary | ICD-10-CM | POA: Diagnosis not present

## 2014-01-20 DIAGNOSIS — I10 Essential (primary) hypertension: Secondary | ICD-10-CM | POA: Diagnosis not present

## 2014-01-20 DIAGNOSIS — C8589 Other specified types of non-Hodgkin lymphoma, extranodal and solid organ sites: Secondary | ICD-10-CM | POA: Diagnosis not present

## 2014-01-20 DIAGNOSIS — I739 Peripheral vascular disease, unspecified: Secondary | ICD-10-CM | POA: Diagnosis not present

## 2014-01-20 DIAGNOSIS — I442 Atrioventricular block, complete: Secondary | ICD-10-CM | POA: Diagnosis not present

## 2014-01-28 DIAGNOSIS — I2589 Other forms of chronic ischemic heart disease: Secondary | ICD-10-CM | POA: Diagnosis not present

## 2014-01-28 DIAGNOSIS — I739 Peripheral vascular disease, unspecified: Secondary | ICD-10-CM | POA: Diagnosis not present

## 2014-01-28 DIAGNOSIS — E7800 Pure hypercholesterolemia, unspecified: Secondary | ICD-10-CM | POA: Diagnosis not present

## 2014-01-28 DIAGNOSIS — Z951 Presence of aortocoronary bypass graft: Secondary | ICD-10-CM | POA: Diagnosis not present

## 2014-01-28 DIAGNOSIS — Z9861 Coronary angioplasty status: Secondary | ICD-10-CM | POA: Diagnosis not present

## 2014-01-28 DIAGNOSIS — Z950 Presence of cardiac pacemaker: Secondary | ICD-10-CM | POA: Diagnosis not present

## 2014-01-28 DIAGNOSIS — E785 Hyperlipidemia, unspecified: Secondary | ICD-10-CM | POA: Diagnosis not present

## 2014-01-28 DIAGNOSIS — Z87891 Personal history of nicotine dependence: Secondary | ICD-10-CM | POA: Diagnosis not present

## 2014-01-28 DIAGNOSIS — I10 Essential (primary) hypertension: Secondary | ICD-10-CM | POA: Diagnosis not present

## 2014-01-28 DIAGNOSIS — Z7902 Long term (current) use of antithrombotics/antiplatelets: Secondary | ICD-10-CM | POA: Diagnosis not present

## 2014-01-28 DIAGNOSIS — Z87898 Personal history of other specified conditions: Secondary | ICD-10-CM | POA: Diagnosis not present

## 2014-01-28 DIAGNOSIS — I442 Atrioventricular block, complete: Secondary | ICD-10-CM | POA: Diagnosis not present

## 2014-01-28 DIAGNOSIS — I2510 Atherosclerotic heart disease of native coronary artery without angina pectoris: Secondary | ICD-10-CM | POA: Diagnosis not present

## 2014-01-28 DIAGNOSIS — I70219 Atherosclerosis of native arteries of extremities with intermittent claudication, unspecified extremity: Secondary | ICD-10-CM | POA: Diagnosis not present

## 2014-01-28 DIAGNOSIS — Z7982 Long term (current) use of aspirin: Secondary | ICD-10-CM | POA: Diagnosis not present

## 2014-01-28 DIAGNOSIS — I7092 Chronic total occlusion of artery of the extremities: Secondary | ICD-10-CM | POA: Diagnosis not present

## 2014-01-28 DIAGNOSIS — Z006 Encounter for examination for normal comparison and control in clinical research program: Secondary | ICD-10-CM | POA: Diagnosis not present

## 2014-01-29 DIAGNOSIS — I7092 Chronic total occlusion of artery of the extremities: Secondary | ICD-10-CM | POA: Diagnosis not present

## 2014-01-29 DIAGNOSIS — I2589 Other forms of chronic ischemic heart disease: Secondary | ICD-10-CM | POA: Diagnosis not present

## 2014-01-29 DIAGNOSIS — I2510 Atherosclerotic heart disease of native coronary artery without angina pectoris: Secondary | ICD-10-CM | POA: Diagnosis not present

## 2014-01-29 DIAGNOSIS — I70219 Atherosclerosis of native arteries of extremities with intermittent claudication, unspecified extremity: Secondary | ICD-10-CM | POA: Diagnosis not present

## 2014-01-29 DIAGNOSIS — I739 Peripheral vascular disease, unspecified: Secondary | ICD-10-CM | POA: Diagnosis not present

## 2014-01-29 DIAGNOSIS — Z006 Encounter for examination for normal comparison and control in clinical research program: Secondary | ICD-10-CM | POA: Diagnosis not present

## 2014-02-01 ENCOUNTER — Other Ambulatory Visit: Payer: Self-pay | Admitting: *Deleted

## 2014-02-01 MED ORDER — CARVEDILOL 12.5 MG PO TABS: 12.5000 mg | ORAL_TABLET | Freq: Two times a day (BID) | ORAL | Status: DC

## 2014-02-01 MED ORDER — CLOPIDOGREL BISULFATE 75 MG PO TABS: 75.0000 mg | ORAL_TABLET | Freq: Every day | ORAL | Status: DC

## 2014-02-10 DIAGNOSIS — I442 Atrioventricular block, complete: Secondary | ICD-10-CM | POA: Diagnosis not present

## 2014-02-10 DIAGNOSIS — I2510 Atherosclerotic heart disease of native coronary artery without angina pectoris: Secondary | ICD-10-CM | POA: Diagnosis not present

## 2014-02-10 DIAGNOSIS — I739 Peripheral vascular disease, unspecified: Secondary | ICD-10-CM | POA: Diagnosis not present

## 2014-02-10 DIAGNOSIS — I10 Essential (primary) hypertension: Secondary | ICD-10-CM | POA: Diagnosis not present

## 2014-02-15 ENCOUNTER — Ambulatory Visit (HOSPITAL_COMMUNITY)
Admission: RE | Admit: 2014-02-15 | Discharge: 2014-02-15 | Disposition: A | Discharge status: Home / Self Care | Payer: Medicare Other | Source: Ambulatory Visit | Attending: Cardiology | Admitting: Cardiology

## 2014-02-15 DIAGNOSIS — I6529 Occlusion and stenosis of unspecified carotid artery: Secondary | ICD-10-CM | POA: Diagnosis not present

## 2014-02-15 DIAGNOSIS — I6521 Occlusion and stenosis of right carotid artery: Secondary | ICD-10-CM

## 2014-02-15 NOTE — Progress Notes (Signed)
Carotid Duplex Completed. Kaci Freel, BS, RDMS, RVT  

## 2014-02-23 ENCOUNTER — Other Ambulatory Visit (HOSPITAL_COMMUNITY): Payer: Self-pay | Admitting: Cardiovascular Disease

## 2014-02-23 ENCOUNTER — Telehealth: Payer: Self-pay | Admitting: *Deleted

## 2014-02-23 DIAGNOSIS — I6523 Occlusion and stenosis of bilateral carotid arteries: Secondary | ICD-10-CM

## 2014-02-23 DIAGNOSIS — I739 Peripheral vascular disease, unspecified: Secondary | ICD-10-CM

## 2014-02-23 NOTE — Telephone Encounter (Signed)
LM with carotid doppler results.  Will need a repeat in 12 months - order placed.

## 2014-02-23 NOTE — Telephone Encounter (Signed)
Message copied by Tressa Busman on Tue Feb 23, 2014  4:48 PM ------      Message from: Sanda Klein      Created: Tue Feb 23, 2014 11:25 AM       Very slight worsening in right carotid (still around 50% narrowing). Please repeat in 12 months. ------

## 2014-02-24 ENCOUNTER — Telehealth (HOSPITAL_COMMUNITY): Payer: Self-pay | Admitting: *Deleted

## 2014-02-24 ENCOUNTER — Ambulatory Visit (INDEPENDENT_AMBULATORY_CARE_PROVIDER_SITE_OTHER): Payer: Medicare Other | Admitting: *Deleted

## 2014-02-24 DIAGNOSIS — I2589 Other forms of chronic ischemic heart disease: Secondary | ICD-10-CM | POA: Diagnosis not present

## 2014-02-24 DIAGNOSIS — I255 Ischemic cardiomyopathy: Secondary | ICD-10-CM

## 2014-02-24 LAB — MDC_IDC_ENUM_SESS_TYPE_INCLINIC
Battery Remaining Longevity: 18 mo
Brady Statistic RA Percent Paced: 99 %
Brady Statistic RV Percent Paced: 100 %
Implantable Pulse Generator Serial Number: 594681
Lead Channel Impedance Value: 490 Ohm
Lead Channel Impedance Value: 650 Ohm
Lead Channel Pacing Threshold Amplitude: 0.8 V
Lead Channel Pacing Threshold Pulse Width: 0.4 ms
Lead Channel Pacing Threshold Pulse Width: 0.8 ms
Lead Channel Setting Pacing Amplitude: 2.4 V
Lead Channel Setting Pacing Amplitude: 3 V
Lead Channel Setting Sensing Sensitivity: 3.5 mV
MDC IDC MSMT LEADCHNL RA PACING THRESHOLD AMPLITUDE: 1.4 V
MDC IDC SET LEADCHNL RV PACING PULSEWIDTH: 0.4 ms

## 2014-02-24 NOTE — Progress Notes (Signed)
Pacemaker check in clinic. Normal device function. Thresholds, sensing, impedances consistent with previous measurements. Device programmed to maximize longevity. 90  mode switches that appear to be chronic noise on the atrial lead.  No high ventricular rates noted. 120.3K PVC's since last check in May.  Device programmed at appropriate safety margins. Histogram distribution appropriate for patient activity level. Device programmed to optimize intrinsic conduction. Estimated longevity 1.5 years.  Patient education completed.  ROV in August with Dr. [name].

## 2014-03-02 ENCOUNTER — Encounter: Payer: Self-pay | Admitting: Cardiovascular Disease

## 2014-03-03 ENCOUNTER — Ambulatory Visit (HOSPITAL_COMMUNITY)
Admission: RE | Admit: 2014-03-03 | Discharge: 2014-03-03 | Disposition: A | Discharge status: Home / Self Care | Payer: Medicare Other | Source: Ambulatory Visit | Attending: Cardiovascular Disease | Admitting: Cardiovascular Disease

## 2014-03-03 DIAGNOSIS — I739 Peripheral vascular disease, unspecified: Secondary | ICD-10-CM

## 2014-03-03 NOTE — Progress Notes (Signed)
Left Lower Extremity Arterial Duplex Completed. °Brianna L Mazza,RVT °

## 2014-03-08 ENCOUNTER — Other Ambulatory Visit: Payer: Self-pay | Admitting: Physician Assistant

## 2014-03-08 ENCOUNTER — Ambulatory Visit
Admission: RE | Admit: 2014-03-08 | Discharge: 2014-03-08 | Disposition: A | Discharge status: Home / Self Care | Payer: Medicare Other | Source: Ambulatory Visit | Attending: Physician Assistant | Admitting: Physician Assistant

## 2014-03-08 DIAGNOSIS — M19049 Primary osteoarthritis, unspecified hand: Secondary | ICD-10-CM | POA: Diagnosis not present

## 2014-03-08 DIAGNOSIS — C8588 Other specified types of non-Hodgkin lymphoma, lymph nodes of multiple sites: Secondary | ICD-10-CM | POA: Diagnosis not present

## 2014-03-08 DIAGNOSIS — Z8572 Personal history of non-Hodgkin lymphomas: Secondary | ICD-10-CM

## 2014-03-08 DIAGNOSIS — M898X9 Other specified disorders of bone, unspecified site: Secondary | ICD-10-CM

## 2014-03-08 DIAGNOSIS — M6740 Ganglion, unspecified site: Secondary | ICD-10-CM | POA: Diagnosis not present

## 2014-03-11 ENCOUNTER — Encounter: Payer: Self-pay | Admitting: Cardiovascular Disease

## 2014-03-26 ENCOUNTER — Ambulatory Visit (INDEPENDENT_AMBULATORY_CARE_PROVIDER_SITE_OTHER): Payer: Medicare Other | Admitting: Cardiovascular Disease

## 2014-03-26 ENCOUNTER — Encounter: Payer: Self-pay | Admitting: Cardiovascular Disease

## 2014-03-26 VITALS — BP 118/72 | HR 82 | Resp 16 | Ht 70.0 in | Wt 205.0 lb

## 2014-03-26 DIAGNOSIS — I442 Atrioventricular block, complete: Secondary | ICD-10-CM

## 2014-03-26 DIAGNOSIS — Z9889 Other specified postprocedural states: Secondary | ICD-10-CM

## 2014-03-26 DIAGNOSIS — I6521 Occlusion and stenosis of right carotid artery: Secondary | ICD-10-CM

## 2014-03-26 DIAGNOSIS — I10 Essential (primary) hypertension: Secondary | ICD-10-CM | POA: Diagnosis not present

## 2014-03-26 DIAGNOSIS — I739 Peripheral vascular disease, unspecified: Secondary | ICD-10-CM

## 2014-03-26 DIAGNOSIS — I209 Angina pectoris, unspecified: Secondary | ICD-10-CM | POA: Diagnosis not present

## 2014-03-26 DIAGNOSIS — I6529 Occlusion and stenosis of unspecified carotid artery: Secondary | ICD-10-CM

## 2014-03-26 DIAGNOSIS — Z95820 Peripheral vascular angioplasty status with implants and grafts: Secondary | ICD-10-CM

## 2014-03-26 DIAGNOSIS — I255 Ischemic cardiomyopathy: Secondary | ICD-10-CM

## 2014-03-26 DIAGNOSIS — I25718 Atherosclerosis of autologous vein coronary artery bypass graft(s) with other forms of angina pectoris: Secondary | ICD-10-CM

## 2014-03-26 DIAGNOSIS — I25810 Atherosclerosis of coronary artery bypass graft(s) without angina pectoris: Secondary | ICD-10-CM

## 2014-03-26 DIAGNOSIS — E785 Hyperlipidemia, unspecified: Secondary | ICD-10-CM

## 2014-03-26 DIAGNOSIS — I2589 Other forms of chronic ischemic heart disease: Secondary | ICD-10-CM

## 2014-03-26 MED ORDER — METOPROLOL TARTRATE 50 MG PO TABS: 50.0000 mg | ORAL_TABLET | Freq: Two times a day (BID) | ORAL | Status: DC

## 2014-03-26 NOTE — Progress Notes (Signed)
Patient ID: David [name], male   DOB: [birth year], 80 y.o.   MRN: [id number]     Reason for office visit [facility] - pacemaker f/u, CAD, PAD   David [name] is here for pacemaker followup.  He sees David [name] for his vascular problems. He recently had a successful left popliteal angioplasty/stent by David [name] with remarkable reduction in claudication symptoms. He is markedly more active and despite this has not helped angina or dyspnea on exertion. It has been almost exactly one year since his last coronary procedure when he received a drug-eluting stent to the saphenous vein graft to the oblique marginal artery. He does not have any symptoms of restenosis.  Pacemaker interrogation 7/22 shows normal device function. He is essentially 100% paced in both the right atrium and right ventricle and is pacemaker dependent. Chronic "noise" as seen on his unipolar atrial lead.  David [name] has had numerous coronary revascularization procedures. He was recently received a stent in the saphenous vein graft to the oblique marginal artery in July 2014. He is now almost 20 years status post coronary bypass surgery. The same bypass graft had received a stent in 2007. He has mild ischemic cardiomyopathy with an ejection fraction of 40-45%, without clinically evident congestive heart failure. The nuclear stress test on May 01, 2013 shows resolution of the previously reversible inferolateral ischemic defect and no change in the previously described scar in the apex and inferior wall. LVEF is calculated at 44%.  He had occlusion of the left popliteal artery with distal reconstitution via collaterals. He had successful recanalization with placement of 2 stents by David [name] in 2015 . This led to marked improvement in his claudication He is pacemaker dependent secondary to complete heart block and his dual chamber Actor (gen change 2009) is likely to reach ERI in the next 18 months. Note that both his atrial and  ventricular leads were placed in 1995 and are unipolar. (Atrial model 438-05, ventricular model 430-07). Interrogation of his device today again shows some issues with "noise" on the atrial channel but none on the ventricular channel. Overall device function is normal and he has virtually 100% atrial pacing as well as 100% ventricular pacing.  Mild carotid artery disease (50-69% proximal internal carotid artery stenosis on the right, mild plaque on the left), last duplex US was February 10, 2013.  He has a history of non-Hodgkin's lymphoma with a large neck mass but also mediastinal and abdominal lymphadenopathy treated with radiotherapy to the cervical area in 2011 followed by chemotherapy with Rituxan and fludarabine. He is considered to be in remission.    Allergies  Allergen Reactions  . Azithromycin Diarrhea  . Lactose Intolerance (Gi)   . Niacin And Related Diarrhea  . Sulfa Antibiotics Rash    Current Outpatient Prescriptions  Medication Sig Dispense Refill  . acetaminophen (TYLENOL) 325 MG tablet Take 2 tablets (650 mg total) by mouth every 4 (four) hours as needed for pain or fever.      Marland Kitchen aspirin EC 81 MG tablet Take 81 mg by mouth at bedtime.      . bimatoprost (LUMIGAN) 0.01 % SOLN Place 1 drop into both eyes at bedtime.       . carvedilol (COREG) 12.5 MG tablet Take 1 tablet (12.5 mg total) by mouth 2 (two) times daily with a meal.  180 tablet  3  . cilostazol (PLETAL) 100 MG tablet Take 100 mg by mouth 2 (two) times daily.      Marland Kitchen  clopidogrel (PLAVIX) 75 MG tablet Take 1 tablet (75 mg total) by mouth daily.  90 tablet  3  . Glucosamine-Chondroitin 750-600 MG TABS Take 1 tablet by mouth 2 (two) times daily.      Marland Kitchen lisinopril (PRINIVIL,ZESTRIL) 20 MG tablet Take 20 mg by mouth daily.      . Multiple Vitamins-Minerals (SENIOR MULTIVITAMIN PLUS PO) Take 1 tablet by mouth daily.      . nitroGLYCERIN (NITROSTAT) 0.4 MG SL tablet Place 1 tablet (0.4 mg total) under the tongue every 5 (five)  minutes as needed for chest pain.  25 tablet  2  . omega-3 acid ethyl esters (LOVAZA) 1 G capsule Take 2 capsules (2 g total) by mouth 2 (two) times daily.  360 capsule  3  . pantoprazole (PROTONIX) 40 MG tablet Take 1 tablet (40 mg total) by mouth daily.  90 tablet  3  . Probiotic Product (PROBIOTIC DAILY PO) Take 1 tablet by mouth daily.      . rosuvastatin (CRESTOR) 40 MG tablet Take 1 tablet (40 mg total) by mouth at bedtime.  90 tablet  3   No current facility-administered medications for this visit.    Past Medical History  Diagnosis Date  . CAD (coronary artery disease)   . Hypertension   . Peripheral vascular disease with claudication   . Hyperlipidemia   . Left ventricular dysfunction   . Complete heart block   . Pacemaker   . Anginal pain   . Myocardial infarction 1989  . Carotid artery disease   . PAD (peripheral artery disease)   . Arthritis     "hands" (10/19/2013)  . Non Hodgkin's lymphoma 06/2010    "excised from left neck then tx'd w/chemo"    Past Surgical History  Procedure Laterality Date  . Anal fissure repair  1990's  . 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 Insert / replace / remove pacemaker  1995; 2002; 2009    [employer]  . Superficial lymph node biopsy / excision Left 2011    "neck; turned out to be non Hodgkins lymphoma"  . Popliteal artery stent Right 10/19/2013  . Cardiac catheterization  1989  . Coronary angioplasty with stent placement  2007; 03/04/2013    ""2 +1"  . Coronary artery bypass graft  09/10/1988    "CABG X5" (03/04/2013) LIMA to LAD,SVG to imtermediate OM1 OM II,SVG to PDA    Family History  Problem Relation Age of Onset  . Diabetes Mother   . Stroke Mother   . Heart attack Father   . Hypertension Sister   . Hyperlipidemia Sister   . Hypertension Sister   . Hyperlipidemia Sister     History   Social History  . Marital Status: Married    Spouse Name: N/A    Number of Children: N/A  . Years  of Education: N/A   Occupational History  . Not on file.   Social History Main Topics  . Smoking status: Former Smoker -- 0.12 packs/day for 1 years    Types: Cigarettes    Quit date: 08/06/1956  . Smokeless tobacco: Never Used  . Alcohol Use: 1.2 oz/week    2 Glasses of wine per week  . Drug Use: No  . Sexual Activity: No   Other Topics Concern  . Not on file   Social History Narrative  . No narrative on file    Review of systems: The patient specifically denies any chest pain at  rest or with exertion, dyspnea at rest or with exertion, orthopnea, paroxysmal nocturnal dyspnea, syncope, palpitations, focal neurological deficits, lower extremity edema, unexplained weight gain, cough, hemoptysis or wheezing.  The patient also denies abdominal pain, nausea, vomiting, dysphagia, diarrhea, constipation, polyuria, polydipsia, dysuria, hematuria, frequency, urgency, abnormal bleeding or bruising, fever, chills, unexpected weight changes, mood swings, change in skin or hair texture, change in voice quality, auditory or visual problems, allergic reactions or rashes, new musculoskeletal complaints other than usual "aches and pains".   PHYSICAL EXAM BP 118/72  Pulse 82  Resp 16  Ht 5\' 10"  (1.778 m)  Wt 205 lb (92.987 kg)  BMI 29.41 kg/m2 General: Alert, oriented x3, no distress  Head: no evidence of trauma, PERRL, EOMI, no exophtalmos or lid lag, no myxedema, no xanthelasma; normal ears, nose and oropharynx  Neck: normal jugular venous pulsations and no hepatojugular reflux; brisk carotid pulses without delay and bilateral carotid bruits  Chest: clear to auscultation, no signs of consolidation by percussion or palpation, normal fremitus, symmetrical and full respiratory excursions; healed sternotomy and pacemaker scars  Cardiovascular: normal position and quality of the apical impulse, regular rhythm, normal first and paradoxically split second heart sounds, no murmurs, rubs or gallops    Abdomen: no tenderness or distention, no masses by palpation, no abnormal pulsatility or arterial bruits, normal bowel sounds, no hepatosplenomegaly  Extremities: no clubbing, cyanosis or edema; 2+ radial, ulnar and brachial pulses bilaterally; 2+ right femoral, posterior tibial and dorsalis pedis pulses; 2+ left femoral, posterior tibial and dorsalis pedis pulses; no subclavian or femoral bruits  Neurological: grossly nonfocal   EKG: 100% atrioventricular sequential pacing   Lipid Panel     Component Value Date/Time   CHOL 115 05/07/2011 0903   TRIG 114 05/07/2011 0903   HDL 37* 05/07/2011 0903   CHOLHDL 3.1 05/07/2011 0903   VLDL 23 05/07/2011 0903   LDLCALC 55 05/07/2011 0903    BMET    Component Value Date/Time   NA 141 10/21/2013 0440   K 4.0 10/21/2013 0440   CL 105 10/21/2013 0440   CO2 21 10/21/2013 0440   GLUCOSE 108* 10/21/2013 0440   BUN 22 10/21/2013 0440   CREATININE 1.18 10/21/2013 0440   CREATININE 1.11 08/25/2013 1145   CALCIUM 8.3* 10/21/2013 0440   GFRNONAA 57* 10/21/2013 0440   GFRAA 66* 10/21/2013 0440     ASSESSMENT AND PLAN  Complete heart block- PM '95 with Gen change '02 and '09 [employer] [facility] [employer] 2009. He is pacemaker dependent. He has complete heart block but also paces almost 100% of the time the atrium. His leads are unipolar and this is causing some issues with false mode switch due to noise on the atrial channel. The atrial sensitivity is already set fairly high and I don't think we should increase any further since we may miss the opportunity to diagnose atrial fibrillation. The generator will be due for change out in another couple of years and we will discuss whether it is worthwhile putting in a new atrial lead at that time.   CAD s/p CABG 1995, s/p stent SVG-OM 2007 and July 2014  Despite receiving a drug-eluting stent, there is still considerable risk of development of in-stent restenosis in this 80 year-old saphenous vein graft that was initially stented  in 2007. At this point in time his angina is gone and his nuclear perfusion study performed in September does not show any major areas of reversible ischemia. He is symptom-free despite being much more  active after his PAD procedure. He is receiving near comprehensive anti-anginal therapy with carvedilol, long-acting nitrates, ranolazine,   Cardiomyopathy, ischemic- EF 40-45% by echo 4/14  Clinically he does not have manifestations of congestive heart. He is on appropriate carvedilol and ACE inhibitor therapy. He does not require diuretic therapy at this time.   Dyslipidemia  Excellent lipid profile except for slightly low HDL. No changes to medications are recommended.   PVD - Lt popliteal occlusion s/p PTA stents 2015  Marked improvement in claudication symptoms. He is walking much more now.  Asymptomatic stenosis of right carotid artery without infarction  Last had Doppler ultrasonography in July 2014 with a 50-69% proximal ICA stenosis; less than 50% on the left side, antegrade flow in vertebrals bilaterally. Schedule a follow up study.   HTN  BP excellent. No changes made.  Meds ordered this encounter  Medications  . Probiotic Product (PROBIOTIC DAILY PO)    Sig: Take 1 tablet by mouth daily.    Holli Humbles, MD, [place] [phone] office [phone] pager

## 2014-03-26 NOTE — Patient Instructions (Signed)
STOP Carvedilol.  START Metoprolol Tartrate 50mg  twice a day.  Dr. [name] recommends that you schedule a follow-up appointment in: 3 months.

## 2014-03-29 ENCOUNTER — Ambulatory Visit (HOSPITAL_COMMUNITY)
Admission: RE | Admit: 2014-03-29 | Discharge: 2014-03-29 | Disposition: A | Discharge status: Home / Self Care | Payer: Medicare Other | Source: Ambulatory Visit | Attending: Cardiology | Admitting: Cardiology

## 2014-03-29 DIAGNOSIS — I658 Occlusion and stenosis of other precerebral arteries: Secondary | ICD-10-CM | POA: Diagnosis not present

## 2014-03-29 DIAGNOSIS — I6523 Occlusion and stenosis of bilateral carotid arteries: Secondary | ICD-10-CM

## 2014-03-29 DIAGNOSIS — I6529 Occlusion and stenosis of unspecified carotid artery: Secondary | ICD-10-CM | POA: Diagnosis not present

## 2014-03-29 NOTE — Progress Notes (Signed)
Carotid Duplex Completed. Faithlyn Recktenwald, BS, RDMS, RVT  

## 2014-04-07 ENCOUNTER — Other Ambulatory Visit: Payer: Self-pay | Admitting: *Deleted

## 2014-04-07 DIAGNOSIS — I6523 Occlusion and stenosis of bilateral carotid arteries: Secondary | ICD-10-CM

## 2014-04-09 DIAGNOSIS — H43819 Vitreous degeneration, unspecified eye: Secondary | ICD-10-CM | POA: Diagnosis not present

## 2014-04-09 DIAGNOSIS — H40019 Open angle with borderline findings, low risk, unspecified eye: Secondary | ICD-10-CM | POA: Diagnosis not present

## 2014-04-09 DIAGNOSIS — H40059 Ocular hypertension, unspecified eye: Secondary | ICD-10-CM | POA: Diagnosis not present

## 2014-04-09 DIAGNOSIS — H259 Unspecified age-related cataract: Secondary | ICD-10-CM | POA: Diagnosis not present

## 2014-04-14 DIAGNOSIS — Z23 Encounter for immunization: Secondary | ICD-10-CM | POA: Diagnosis not present

## 2014-04-16 ENCOUNTER — Ambulatory Visit (HOSPITAL_BASED_OUTPATIENT_CLINIC_OR_DEPARTMENT_OTHER): Payer: Medicare Other | Admitting: Oncology

## 2014-04-16 ENCOUNTER — Telehealth: Payer: Self-pay | Admitting: Oncology

## 2014-04-16 ENCOUNTER — Other Ambulatory Visit (HOSPITAL_BASED_OUTPATIENT_CLINIC_OR_DEPARTMENT_OTHER): Payer: Medicare Other

## 2014-04-16 VITALS — BP 138/60 | HR 62 | Temp 97.8°F | Resp 18 | Ht 70.0 in | Wt 205.0 lb

## 2014-04-16 DIAGNOSIS — C8291 Follicular lymphoma, unspecified, lymph nodes of head, face, and neck: Secondary | ICD-10-CM

## 2014-04-16 DIAGNOSIS — C8590 Non-Hodgkin lymphoma, unspecified, unspecified site: Secondary | ICD-10-CM

## 2014-04-16 DIAGNOSIS — D6959 Other secondary thrombocytopenia: Secondary | ICD-10-CM | POA: Diagnosis not present

## 2014-04-16 LAB — CBC WITH DIFFERENTIAL/PLATELET
BASO%: 0.6 % (ref 0.0–2.0)
Basophils Absolute: 0 10*3/uL (ref 0.0–0.1)
EOS ABS: 0.3 10*3/uL (ref 0.0–0.5)
EOS%: 7.8 % — ABNORMAL HIGH (ref 0.0–7.0)
HEMATOCRIT: 37 % — AB (ref 38.4–49.9)
HGB: 12.9 g/dL — ABNORMAL LOW (ref 13.0–17.1)
LYMPH%: 20.6 % (ref 14.0–49.0)
MCH: 31.3 pg (ref 27.2–33.4)
MCHC: 34.9 g/dL (ref 32.0–36.0)
MCV: 89.8 fL (ref 79.3–98.0)
MONO#: 0.4 10*3/uL (ref 0.1–0.9)
MONO%: 11.3 % (ref 0.0–14.0)
NEUT%: 59.7 % (ref 39.0–75.0)
NEUTROS ABS: 2 10*3/uL (ref 1.5–6.5)
Platelets: 109 10*3/uL — ABNORMAL LOW (ref 140–400)
RBC: 4.12 10*6/uL — ABNORMAL LOW (ref 4.20–5.82)
RDW: 14.1 % (ref 11.0–14.6)
WBC: 3.4 10*3/uL — AB (ref 4.0–10.3)
lymph#: 0.7 10*3/uL — ABNORMAL LOW (ref 0.9–3.3)

## 2014-04-16 NOTE — Telephone Encounter (Signed)
Pt confirmed labs/ov per 09/11 POF, gave pt AVS...Marland KitchenMarland KitchenKJ

## 2014-04-16 NOTE — Progress Notes (Signed)
  [facility] OFFICE PROGRESS NOTE   Diagnosis: Non-Hodgkin's lymphoma  INTERVAL HISTORY:   David [name] returns as scheduled. David [name] feels well. David [name] has undergone several vascular stent placement procedures this year for management of peripheral vascular disease. David [name] reports resolution of left lower leg pain following the most recent stent placement. No fever, night sweats, anorexia, or palpable lymph nodes.  Objective:  Vital signs in last 24 hours:  Blood pressure 138/60, pulse 62, temperature 97.8 F (36.6 C), temperature source Oral, resp. rate 18, height 5\' 10"  (1.778 m), weight 205 lb (92.987 kg), SpO2 100.00%.    HEENT: Neck without mass Lymphatics: No cervical, supraclavicular, or inguinal nodes. Prominent left greater than right axillary fat pad. Resp: Lungs clear bilaterally Cardio: Regular rate and rhythm GI: No hepatosplenomegaly, nontender, no mass Vascular: No leg edema   Lab Results:  Lab Results  Component Value Date   WBC 3.4* 04/16/2014   HGB 12.9* 04/16/2014   HCT 37.0* 04/16/2014   MCV 89.8 04/16/2014   PLT 109* 04/16/2014   NEUTROABS 2.0 04/16/2014     Medications: I have reviewed the patient's current medications.  Assessment/Plan: 1. Non-Hodgkin's lymphoma, follicular B-cell, low grade, involving a left cervical lymph node with a staging evaluation revealing evidence for advanced stage disease. There was cervical, mediastinal, and abdominal lymphadenopathy and a question of splenic involvement on staging scans. David [name] completed 4 cycles of fludarabine/rituximab with resolution of the palpable lymphadenopathy. The last chemotherapy was given in June 2011.  2. Skin rash following cycle #1 of fludarabine/rituximab, likely related to allopurinol or Bactrim. The Bactrim prophylaxis was discontinued. 3. Chronic Thrombocytopenia secondary chemotherapy and non-Hodgkin's lymphoma, stable. 4. History of neutropenia secondary to chemotherapy. 5. Injection site reaction  to a pneumococcal vaccine in October 2011. 6. History of coronary artery disease and peripheral vascular disease, followed by cardiology   Disposition:  David [name] remains in clinical remission from non-Hodgkin's lymphoma. David [name] will return for an office visit in 8 months. David [name] has received an influenza vaccine this year. David [name] does not wish to receive a pneumococcal vaccine secondary to a previous local reaction.    Betsy Coder, MD  04/16/2014  9:54 AM

## 2014-05-06 ENCOUNTER — Encounter: Payer: Self-pay | Admitting: Cardiovascular Disease

## 2014-06-01 ENCOUNTER — Other Ambulatory Visit: Payer: Self-pay

## 2014-06-01 MED ORDER — CILOSTAZOL 100 MG PO TABS: 100.0000 mg | ORAL_TABLET | Freq: Two times a day (BID) | ORAL | Status: DC

## 2014-06-01 NOTE — Telephone Encounter (Signed)
Rx was sent to pharmacy electronically. 

## 2014-06-07 DIAGNOSIS — L299 Pruritus, unspecified: Secondary | ICD-10-CM | POA: Diagnosis not present

## 2014-06-24 ENCOUNTER — Encounter: Payer: Self-pay | Admitting: Cardiovascular Disease

## 2014-06-24 ENCOUNTER — Ambulatory Visit (INDEPENDENT_AMBULATORY_CARE_PROVIDER_SITE_OTHER): Payer: Medicare Other | Admitting: Cardiovascular Disease

## 2014-06-24 VITALS — BP 122/66 | HR 68 | Resp 16 | Ht 70.0 in | Wt 207.7 lb

## 2014-06-24 DIAGNOSIS — I739 Peripheral vascular disease, unspecified: Secondary | ICD-10-CM | POA: Diagnosis not present

## 2014-06-24 DIAGNOSIS — Z79899 Other long term (current) drug therapy: Secondary | ICD-10-CM | POA: Diagnosis not present

## 2014-06-24 DIAGNOSIS — I6521 Occlusion and stenosis of right carotid artery: Secondary | ICD-10-CM | POA: Diagnosis not present

## 2014-06-24 DIAGNOSIS — E785 Hyperlipidemia, unspecified: Secondary | ICD-10-CM | POA: Diagnosis not present

## 2014-06-24 DIAGNOSIS — I255 Ischemic cardiomyopathy: Secondary | ICD-10-CM | POA: Diagnosis not present

## 2014-06-24 DIAGNOSIS — I472 Ventricular tachycardia: Secondary | ICD-10-CM | POA: Diagnosis not present

## 2014-06-24 DIAGNOSIS — I442 Atrioventricular block, complete: Secondary | ICD-10-CM

## 2014-06-24 DIAGNOSIS — I6529 Occlusion and stenosis of unspecified carotid artery: Secondary | ICD-10-CM | POA: Diagnosis not present

## 2014-06-24 DIAGNOSIS — I25718 Atherosclerosis of autologous vein coronary artery bypass graft(s) with other forms of angina pectoris: Secondary | ICD-10-CM

## 2014-06-24 DIAGNOSIS — I4729 Other ventricular tachycardia: Secondary | ICD-10-CM

## 2014-06-24 HISTORY — DX: Other ventricular tachycardia: I47.29

## 2014-06-24 HISTORY — DX: Ventricular tachycardia: I47.2

## 2014-06-24 LAB — MDC_IDC_ENUM_SESS_TYPE_INCLINIC
Brady Statistic RA Percent Paced: 99 %
Brady Statistic RV Percent Paced: 100 %
Date Time Interrogation Session: 20151119050000
Implantable Pulse Generator Serial Number: 594681
Lead Channel Pacing Threshold Amplitude: 0.7 V
Lead Channel Pacing Threshold Amplitude: 1.5 V
Lead Channel Pacing Threshold Pulse Width: 0.4 ms
Lead Channel Pacing Threshold Pulse Width: 0.8 ms
Lead Channel Setting Pacing Amplitude: 3 V
Lead Channel Setting Sensing Sensitivity: 3.5 mV
MDC IDC MSMT LEADCHNL RA IMPEDANCE VALUE: 520 Ohm
MDC IDC MSMT LEADCHNL RV IMPEDANCE VALUE: 670 Ohm
MDC IDC SET LEADCHNL RV PACING AMPLITUDE: 2.4 V
MDC IDC SET LEADCHNL RV PACING PULSEWIDTH: 0.4 ms

## 2014-06-24 MED ORDER — METOPROLOL TARTRATE 50 MG PO TABS: 75.0000 mg | ORAL_TABLET | Freq: Two times a day (BID) | ORAL | Status: DC

## 2014-06-24 NOTE — Patient Instructions (Addendum)
INCREASE Metoprolol to 75mg  twice a day.  A new Rx has been sent to your pharmacy.  Your physician recommends that you return for lab work in: FASTING at [facility] at [facility].  Dr. [name] recommends that you schedule a follow-up appointment in: 3 months with device.

## 2014-06-24 NOTE — Assessment & Plan Note (Signed)
Continues to have sustained benefit with reduction in claudication after his popliteal artery intervention.

## 2014-06-24 NOTE — Assessment & Plan Note (Signed)
No clinical evidence of congestive heart failure. On appropriate therapy with beta blockers and ace inhibitors and on high-dose statin, aspirin, clopidogrel

## 2014-06-24 NOTE — Assessment & Plan Note (Signed)
Angina free, relatively recent nuclear stress test without reversible ischemia. He should be outside the typical time range for in-stent restenosis at this point, although he is at high risk for restenosis since his most recent procedure was performed for a restenotic lesion. It is probably worthwhile to reevaluate for ischemia.

## 2014-06-24 NOTE — Progress Notes (Signed)
Patient ID: David [name], male   DOB: 1933/02/13, 80 y.o.   MRN: [id number]      Reason for office visit Pacemaker check (complete heart block), CAD   David [name] feels great and has no complaints of syncope/presyncope and has had remarkable improvement in palpitations with treatment with metoprolol. He has not had angina pectoris and denies exertional dyspnea. He continues to have a major reduction in intermittent claudication after his popliteal artery intervention.  Interrogation of his pacemaker shows normal device function with 100% ventricular pacing due to complete heart block. Lead parameters are great. However his device has recorded a lengthy episode of nonsustained ventricular tachycardia, most 9 seconds in duration, 200 beats a minute. This occurred around 10:00 in the morning just last Sunday when he was in [place] visiting his son. He remembers no problems around that time. He was preparing to go to Sunday morning services.  David [name] has had numerous coronary revascularization procedures. He was recently received a stent in the saphenous vein graft to the oblique marginal artery in July 2014. He is now almost 20 years status post coronary bypass surgery. The same bypass graft had received a stent in 2007. He has mild ischemic cardiomyopathy with an ejection fraction of 40-45%, without clinically evident congestive heart failure. The nuclear stress test on May 01, 2013 shows resolution of the previously reversible inferolateral ischemic defect and no change in the previously described scar in the apex and inferior wall. LVEF is calculated at 44%.  He had occlusion of the left popliteal artery with distal reconstitution via collaterals. He had successful recanalization with placement of 2 stents by Dr. Andree Elk in 2015 . This led to marked improvement in his claudication He is pacemaker dependent secondary to complete heart block and his dual chamber Actor (gen change  2009) is likely to reach ERI in the next 18 months. Note that both his atrial and ventricular leads were placed in 1995 and are unipolar. (Atrial model 438-05, ventricular model 430-07). Interrogation of his device today again shows some issues with "noise" on the atrial channel but none on the ventricular channel. Overall device function is normal and he has virtually 100% atrial pacing as well as 100% ventricular pacing.  Mild carotid artery disease (50-69% proximal internal carotid artery stenosis on the right, mild plaque on the left), last duplex US was February 10, 2013.  He has a history of non-Hodgkin's lymphoma with a large neck mass but also mediastinal and abdominal lymphadenopathy treated with radiotherapy to the cervical area in 2011 followed by chemotherapy with Rituxan and fludarabine. He is considered to be in remission.    Allergies  Allergen Reactions  . Azithromycin Diarrhea  . Lactose Intolerance (Gi)   . Niacin And Related Diarrhea  . Sulfa Antibiotics Rash    Current Outpatient Prescriptions  Medication Sig Dispense Refill  . acetaminophen (TYLENOL) 325 MG tablet Take 2 tablets (650 mg total) by mouth every 4 (four) hours as needed for pain or fever.    Marland Kitchen aspirin EC 81 MG tablet Take 81 mg by mouth at bedtime.    . bimatoprost (LUMIGAN) 0.01 % SOLN Place 1 drop into both eyes at bedtime.     . cilostazol (PLETAL) 100 MG tablet Take 1 tablet (100 mg total) by mouth 2 (two) times daily. 180 tablet 3  . clopidogrel (PLAVIX) 75 MG tablet Take 1 tablet (75 mg total) by mouth daily. 90 tablet 3  . Glucosamine-Chondroitin 750-600 MG TABS Take  1 tablet by mouth 2 (two) times daily.    Marland Kitchen lisinopril (PRINIVIL,ZESTRIL) 20 MG tablet Take 20 mg by mouth daily.    Marland Kitchen loratadine (CLARITIN) 10 MG tablet Take 10 mg by mouth daily as needed for allergies.    . metoprolol (LOPRESSOR) 50 MG tablet Take 1.5 tablets (75 mg total) by mouth 2 (two) times daily. 270 tablet 3  . Multiple  Vitamins-Minerals (SENIOR MULTIVITAMIN PLUS PO) Take 1 tablet by mouth daily.    . nitroGLYCERIN (NITROSTAT) 0.4 MG SL tablet Place 1 tablet (0.4 mg total) under the tongue every 5 (five) minutes as needed for chest pain. 25 tablet 2  . omega-3 acid ethyl esters (LOVAZA) 1 G capsule Take 2 capsules (2 g total) by mouth 2 (two) times daily. 360 capsule 3  . pantoprazole (PROTONIX) 40 MG tablet Take 1 tablet (40 mg total) by mouth daily. 90 tablet 3  . Probiotic Product (PROBIOTIC DAILY PO) Take 1 tablet by mouth daily.    . rosuvastatin (CRESTOR) 40 MG tablet Take 1 tablet (40 mg total) by mouth at bedtime. 90 tablet 3   No current facility-administered medications for this visit.    Past Medical History  Diagnosis Date  . CAD (coronary artery disease)   . Hypertension   . Peripheral vascular disease with claudication   . Hyperlipidemia   . Left ventricular dysfunction   . Complete heart block   . Pacemaker   . Anginal pain   . Myocardial infarction 1989  . Carotid artery disease   . PAD (peripheral artery disease)   . Arthritis     "hands" (10/19/2013)  . Non Hodgkin's lymphoma 06/2010    "excised from left neck then tx'd w/chemo"  . NSVT (nonsustained ventricular tachycardia) 06/24/2014    Max rate 200 bpm, 28-beat, nearly 9-second episode recorded by pacemaker on November 15, asymptomatic     Past Surgical History  Procedure Laterality Date  . Anal fissure repair  1990's  . 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 Insert / replace / remove pacemaker  1995; 2002; 2009    [employer]  . Superficial lymph node biopsy / excision Left 2011    "neck; turned out to be non Hodgkins lymphoma"  . Popliteal artery stent Right 10/19/2013  . Cardiac catheterization  1989  . Coronary angioplasty with stent placement  2007; 03/04/2013    ""2 +1"  . Coronary artery bypass graft  09/10/1988    "CABG X5" (03/04/2013) LIMA to LAD,SVG to imtermediate OM1 OM II,SVG to  PDA    Family History  Problem Relation Age of Onset  . Diabetes Mother   . Stroke Mother   . Heart attack Father   . Hypertension Sister   . Hyperlipidemia Sister   . Hypertension Sister   . Hyperlipidemia Sister     History   Social History  . Marital Status: Married    Spouse Name: N/A    Number of Children: N/A  . Years of Education: N/A   Occupational History  . Not on file.   Social History Main Topics  . Smoking status: Former Smoker -- 0.12 packs/day for 1 years    Types: Cigarettes    Quit date: 08/06/1956  . Smokeless tobacco: Never Used  . Alcohol Use: 1.2 oz/week    2 Glasses of wine per week  . Drug Use: No  . Sexual Activity: No   Other Topics Concern  . Not on file  Social History Narrative    Review of systems: The patient specifically denies any chest pain at rest or with exertion, dyspnea at rest or with exertion, orthopnea, paroxysmal nocturnal dyspnea, syncope, palpitations, focal neurological deficits, intermittent claudication, lower extremity edema, unexplained weight gain, cough, hemoptysis or wheezing.  The patient also denies abdominal pain, nausea, vomiting, dysphagia, diarrhea, constipation, polyuria, polydipsia, dysuria, hematuria, frequency, urgency, abnormal bleeding or bruising, fever, chills, unexpected weight changes, mood swings, change in skin or hair texture, change in voice quality, auditory or visual problems, allergic reactions or rashes, new musculoskeletal complaints other than usual "aches and pains".   PHYSICAL EXAM BP 122/66 mmHg  Pulse 68  Resp 16  Ht 5\' 10"  (1.778 m)  Wt 207 lb 11.2 oz (94.212 kg)  BMI 29.80 kg/m2 General: Alert, oriented x3, no distress  Head: no evidence of trauma, PERRL, EOMI, no exophtalmos or lid lag, no myxedema, no xanthelasma; normal ears, nose and oropharynx  Neck: normal jugular venous pulsations and no hepatojugular reflux; brisk carotid pulses without delay and bilateral carotid  bruits  Chest: clear to auscultation, no signs of consolidation by percussion or palpation, normal fremitus, symmetrical and full respiratory excursions; healed sternotomy and pacemaker scars  Cardiovascular: normal position and quality of the apical impulse, regular rhythm, normal first and paradoxically split second heart sounds, no murmurs, rubs or gallops  Abdomen: no tenderness or distention, no masses by palpation, no abnormal pulsatility or arterial bruits, normal bowel sounds, no hepatosplenomegaly  Extremities: no clubbing, cyanosis or edema; 2+ radial, ulnar and brachial pulses bilaterally; 2+ right femoral, posterior tibial and dorsalis pedis pulses; 2+ left femoral, posterior tibial and dorsalis pedis pulses; no subclavian or femoral bruits  Neurological: grossly nonfocal   ECG: AV Paced  Lipid Panel     Component Value Date/Time   CHOL 115 05/07/2011 0903   TRIG 114 05/07/2011 0903   HDL 37* 05/07/2011 0903   CHOLHDL 3.1 05/07/2011 0903   VLDL 23 05/07/2011 0903   LDLCALC 55 05/07/2011 0903    BMET    Component Value Date/Time   NA 141 10/21/2013 0440   K 4.0 10/21/2013 0440   CL 105 10/21/2013 0440   CO2 21 10/21/2013 0440   GLUCOSE 108* 10/21/2013 0440   BUN 22 10/21/2013 0440   CREATININE 1.18 10/21/2013 0440   CREATININE 1.11 08/25/2013 1145   CALCIUM 8.3* 10/21/2013 0440   GFRNONAA 57* 10/21/2013 0440   GFRAA 66* 10/21/2013 0440     ASSESSMENT AND PLAN NSVT (nonsustained ventricular tachycardia) Check electrolytes. Increase metoprolol to 75 mg twice a day. He had a nuclear scan roughly 12 months ago that showed a large anterior scar but no reversible ischemia. Asked him to call us immediately should he develop symptoms of presyncope or syncope and to come for a pacemaker check showed that occur. We'll see him back in the office in 3 months.  Cardiomyopathy, ischemic- EF 40-45% by echo 4/14 No clinical evidence of congestive heart failure. On  appropriate therapy with beta blockers and ace inhibitors and on high-dose statin, aspirin, clopidogrel  PVD - Lt popliteal occlusion with collaterals '09; rt popliteal successful diamondback orbital rotational atherectomy PTA and stent using an IDEV stent 10/21/13 Continues to have sustained benefit with reduction in claudication after his popliteal artery intervention.  Asymptomatic stenosis of right carotid artery without infarction No progression in moderate stenosis by repeat carotid duplex ultrasound performed a few months ago.  CAD s/p CABG 1995, s/p stent SVG-OM 2007 and July  2014 Angina free, relatively recent nuclear stress test without reversible ischemia. He should be outside the typical time range for in-stent restenosis at this point, although he is at high risk for restenosis since his most recent procedure was performed for a restenotic lesion. It is probably worthwhile to reevaluate for ischemia.   Orders Placed This Encounter  Procedures  . Comprehensive metabolic panel  . Lipid panel   Meds ordered this encounter  Medications  . loratadine (CLARITIN) 10 MG tablet    Sig: Take 10 mg by mouth daily as needed for allergies.  . metoprolol (LOPRESSOR) 50 MG tablet    Sig: Take 1.5 tablets (75 mg total) by mouth 2 (two) times daily.    Dispense:  270 tablet    Refill:  Maize Danyal Adorno, MD, [facility] [phone] office [phone] pager

## 2014-06-24 NOTE — Assessment & Plan Note (Addendum)
Check electrolytes. Increase metoprolol to 75 mg twice a day. He had a nuclear scan roughly 12 months ago that showed a large anterior scar but no reversible ischemia. This was a study performed roughly 2 months after intervention for a restenotic lesion in the left circumflex territory saphenous vein bypass graft. I worry that his lengthy episode of nonsustained VT might be a sign of ischemia Asked him to call us immediately should he develop symptoms of presyncope or syncope and to come for a pacemaker check showed that occur. We'll see him back in the office in 3 months.

## 2014-06-24 NOTE — Assessment & Plan Note (Signed)
No progression in moderate stenosis by repeat carotid duplex ultrasound performed a few months ago.

## 2014-06-25 DIAGNOSIS — Z79899 Other long term (current) drug therapy: Secondary | ICD-10-CM | POA: Diagnosis not present

## 2014-06-25 DIAGNOSIS — E785 Hyperlipidemia, unspecified: Secondary | ICD-10-CM | POA: Diagnosis not present

## 2014-06-25 LAB — COMPREHENSIVE METABOLIC PANEL
ALK PHOS: 47 U/L (ref 39–117)
ALT: 27 U/L (ref 0–53)
AST: 26 U/L (ref 0–37)
Albumin: 4.6 g/dL (ref 3.5–5.2)
BILIRUBIN TOTAL: 0.6 mg/dL (ref 0.2–1.2)
BUN: 19 mg/dL (ref 6–23)
CO2: 27 mEq/L (ref 19–32)
CREATININE: 1.2 mg/dL (ref 0.50–1.35)
Calcium: 9.2 mg/dL (ref 8.4–10.5)
Chloride: 101 mEq/L (ref 96–112)
Glucose, Bld: 101 mg/dL — ABNORMAL HIGH (ref 70–99)
Potassium: 4.3 mEq/L (ref 3.5–5.3)
SODIUM: 140 meq/L (ref 135–145)
Total Protein: 6.8 g/dL (ref 6.0–8.3)

## 2014-06-25 LAB — LIPID PANEL
CHOL/HDL RATIO: 3.4 ratio
Cholesterol: 119 mg/dL (ref 0–200)
HDL: 35 mg/dL — ABNORMAL LOW (ref >39)
LDL CALC: 47 mg/dL (ref 0–99)
Triglycerides: 185 mg/dL — ABNORMAL HIGH (ref <150)
VLDL: 37 mg/dL (ref 0–40)

## 2014-06-29 ENCOUNTER — Telehealth (HOSPITAL_COMMUNITY): Payer: Self-pay

## 2014-06-29 NOTE — Telephone Encounter (Signed)
Encounter complete. 

## 2014-07-05 ENCOUNTER — Encounter: Payer: Self-pay | Admitting: Cardiovascular Disease

## 2014-07-06 ENCOUNTER — Ambulatory Visit (HOSPITAL_COMMUNITY)
Admission: RE | Admit: 2014-07-06 | Discharge: 2014-07-06 | Disposition: A | Discharge status: Home / Self Care | Payer: Medicare Other | Source: Ambulatory Visit | Attending: Cardiovascular Disease | Admitting: Cardiovascular Disease

## 2014-07-06 DIAGNOSIS — Z950 Presence of cardiac pacemaker: Secondary | ICD-10-CM | POA: Insufficient documentation

## 2014-07-06 DIAGNOSIS — I739 Peripheral vascular disease, unspecified: Secondary | ICD-10-CM | POA: Insufficient documentation

## 2014-07-06 DIAGNOSIS — Z951 Presence of aortocoronary bypass graft: Secondary | ICD-10-CM | POA: Insufficient documentation

## 2014-07-06 DIAGNOSIS — I2510 Atherosclerotic heart disease of native coronary artery without angina pectoris: Secondary | ICD-10-CM | POA: Diagnosis not present

## 2014-07-06 DIAGNOSIS — Z87891 Personal history of nicotine dependence: Secondary | ICD-10-CM | POA: Insufficient documentation

## 2014-07-06 DIAGNOSIS — I25718 Atherosclerosis of autologous vein coronary artery bypass graft(s) with other forms of angina pectoris: Secondary | ICD-10-CM

## 2014-07-06 DIAGNOSIS — I252 Old myocardial infarction: Secondary | ICD-10-CM | POA: Diagnosis not present

## 2014-07-06 DIAGNOSIS — Z8249 Family history of ischemic heart disease and other diseases of the circulatory system: Secondary | ICD-10-CM | POA: Insufficient documentation

## 2014-07-06 DIAGNOSIS — Z95818 Presence of other cardiac implants and grafts: Secondary | ICD-10-CM | POA: Diagnosis not present

## 2014-07-06 DIAGNOSIS — R0600 Dyspnea, unspecified: Secondary | ICD-10-CM | POA: Diagnosis not present

## 2014-07-06 MED ORDER — AMINOPHYLLINE 25 MG/ML IV SOLN
75.0000 mg | Freq: Once | INTRAVENOUS | Status: AC
  Administered 2014-07-06: 75 mg via INTRAVENOUS

## 2014-07-06 MED ORDER — REGADENOSON 0.4 MG/5ML IV SOLN
0.4000 mg | Freq: Once | INTRAVENOUS | Status: AC
  Administered 2014-07-06: 0.4 mg via INTRAVENOUS

## 2014-07-06 MED ORDER — TECHNETIUM TC 99M SESTAMIBI GENERIC - CARDIOLITE
10.8000 | Freq: Once | INTRAVENOUS | Status: AC | PRN
  Administered 2014-07-06: 11 via INTRAVENOUS

## 2014-07-06 MED ORDER — TECHNETIUM TC 99M SESTAMIBI GENERIC - CARDIOLITE
30.6000 | Freq: Once | INTRAVENOUS | Status: AC | PRN
  Administered 2014-07-06: 31 via INTRAVENOUS

## 2014-07-06 NOTE — Procedures (Addendum)
[name] [phone]  Cardiology Nuclear Med Study  David [name] is a 80 y.o. male     MRN : [id number]     DOB: [birth year]  Procedure Date: 07/06/2014  Nuclear Med Background Indication for Stress Test:  Stent Patency History:  Prior MI, Prior stent placement, Prior CABGx5, Pacer Cardiac Risk Factors: Carotid Disease, Family History - CAD, History of Smoking, Lipids and PVD  Symptoms:  DOE   Nuclear Pre-Procedure Caffeine/Decaff Intake:  7:00pm NPO After: 5:00am   IV Site: R Antecubital  IV 0.9% NS with Angio Cath:  22g  Chest Size (in):  44 IV Started by: Otho Perl, CNMT  Height: 5\' 10"  (1.778 m)  Cup Size: n/a  BMI:  Body mass index is 29.7 kg/(m^2). Weight:  207 lb (93.895 kg)   Tech Comments:  n/a    Nuclear Med Study 1 or 2 day study: 1 day  Stress Test Type:  [place] Provider:  Sanda Klein, MD   Resting Radionuclide: Technetium 3m Sestamibi  Resting Radionuclide Dose: 10.8 mCi   Stress Radionuclide:  Technetium 47m Sestamibi  Stress Radionuclide Dose: 30.6 mCi           Stress Protocol Rest HR: 60 Stress HR: 68  Rest BP: 159/92 Stress BP: 158/111  Exercise Time (min): n/a METS: n/a   Predicted Max HR: 141 bpm % Max HR: 53.9 bpm Rate Pressure Product: 12084  Dose of Adenosine (mg):  n/a Dose of Lexiscan: 0.4 mg  Dose of Atropine (mg): n/a Dose of Dobutamine: n/a mcg/kg/min (at max HR)  Stress Test Technologist: Leane Para, CCT Nuclear Technologist: Imagene Riches, CNMT   Rest Procedure:  Myocardial perfusion imaging was performed at rest 45 minutes following the intravenous administration of Technetium 58m Sestamibi. Stress Procedure:  The patient received IV Lexiscan 0.4 mg over 15-seconds.  Technetium 43m Sestamibi injected IV at 30-seconds.  Patient experienced SOB and stomach discomfort and 75 mg Aminophylline IV was  administered.There were no significant changes with Lexiscan.  Quantitative spect images were obtained after a 45 minute delay.  Transient Ischemic Dilatation (Normal <1.22):  1.10  QGS EDV:  130 ml QGS ESV:  75 ml LV Ejection Fraction: 43%       Rest ECG: AV paced rhythm  Stress ECG: No significant change from baseline ECG  QPS Raw Data Images:  There is interference from nuclear activity from structures below the diaphragm. This does not affect the ability to read the study. Stress Images:  There is decreased uptake in the inferior wall. Rest Images:  There is decreased uptake in the inferior wall. Subtraction (SDS):  There is a fixed defect that is most consistent with a previous infarction.  Impression Exercise Capacity:  Lexiscan with no exercise. BP Response:  Normal blood pressure response. Clinical Symptoms:  No significant symptoms noted. ECG Impression:  No significant ST segment change suggestive of ischemia. Comparison with Prior Nuclear Study: No significant change from previous study  Overall Impression:  Low risk stress nuclear study Moderate sized inferior/apical scar w/o ischemia.  LV Wall Motion:  Modertate LV dysfunction with inferior and apical WMA consistant with scar   Lorretta Harp, MD  07/06/2014 12:33 PM

## 2014-07-09 ENCOUNTER — Other Ambulatory Visit: Payer: Self-pay

## 2014-07-09 MED ORDER — PANTOPRAZOLE SODIUM 40 MG PO TBEC: 40.0000 mg | DELAYED_RELEASE_TABLET | Freq: Every day | ORAL | Status: DC

## 2014-07-09 NOTE — Telephone Encounter (Signed)
Rx was sent to pharmacy electronically. 

## 2014-07-13 ENCOUNTER — Telehealth: Payer: Self-pay | Admitting: Cardiovascular Disease

## 2014-07-13 NOTE — Telephone Encounter (Signed)
Spoke with pt, aware of nuclear stress testing results

## 2014-07-13 NOTE — Telephone Encounter (Signed)
Pt would like his stress test results from last Tuesday please.

## 2014-07-15 ENCOUNTER — Encounter (HOSPITAL_COMMUNITY): Payer: Self-pay | Admitting: Cardiovascular Disease

## 2014-08-10 ENCOUNTER — Other Ambulatory Visit (HOSPITAL_COMMUNITY): Payer: Self-pay | Admitting: Cardiovascular Disease

## 2014-08-10 DIAGNOSIS — I739 Peripheral vascular disease, unspecified: Secondary | ICD-10-CM

## 2014-08-31 ENCOUNTER — Ambulatory Visit (HOSPITAL_COMMUNITY)
Admission: RE | Admit: 2014-08-31 | Discharge: 2014-08-31 | Disposition: A | Discharge status: Home / Self Care | Payer: Medicare Other | Source: Ambulatory Visit | Attending: Cardiology | Admitting: Cardiology

## 2014-08-31 DIAGNOSIS — I739 Peripheral vascular disease, unspecified: Secondary | ICD-10-CM | POA: Diagnosis not present

## 2014-08-31 NOTE — Progress Notes (Signed)
Lower Extremity Arterial Duplex Completed. °Brianna L Mazza,RVT °

## 2014-09-17 DIAGNOSIS — R1032 Left lower quadrant pain: Secondary | ICD-10-CM | POA: Diagnosis not present

## 2014-09-17 DIAGNOSIS — R312 Other microscopic hematuria: Secondary | ICD-10-CM | POA: Diagnosis not present

## 2014-09-22 ENCOUNTER — Encounter: Payer: Medicare Other | Admitting: Cardiovascular Disease

## 2014-09-26 DIAGNOSIS — R197 Diarrhea, unspecified: Secondary | ICD-10-CM | POA: Diagnosis not present

## 2014-09-26 DIAGNOSIS — Z7982 Long term (current) use of aspirin: Secondary | ICD-10-CM | POA: Diagnosis not present

## 2014-09-26 DIAGNOSIS — I2510 Atherosclerotic heart disease of native coronary artery without angina pectoris: Secondary | ICD-10-CM | POA: Diagnosis not present

## 2014-09-26 DIAGNOSIS — Z7902 Long term (current) use of antithrombotics/antiplatelets: Secondary | ICD-10-CM | POA: Diagnosis not present

## 2014-09-26 DIAGNOSIS — Z87891 Personal history of nicotine dependence: Secondary | ICD-10-CM | POA: Diagnosis not present

## 2014-09-26 DIAGNOSIS — R112 Nausea with vomiting, unspecified: Secondary | ICD-10-CM | POA: Diagnosis not present

## 2014-09-26 DIAGNOSIS — E780 Pure hypercholesterolemia: Secondary | ICD-10-CM | POA: Diagnosis not present

## 2014-09-26 DIAGNOSIS — Z79899 Other long term (current) drug therapy: Secondary | ICD-10-CM | POA: Diagnosis not present

## 2014-09-26 DIAGNOSIS — Z8572 Personal history of non-Hodgkin lymphomas: Secondary | ICD-10-CM | POA: Diagnosis not present

## 2014-09-26 DIAGNOSIS — Z955 Presence of coronary angioplasty implant and graft: Secondary | ICD-10-CM | POA: Diagnosis not present

## 2014-09-26 DIAGNOSIS — Z951 Presence of aortocoronary bypass graft: Secondary | ICD-10-CM | POA: Diagnosis not present

## 2014-09-26 DIAGNOSIS — I10 Essential (primary) hypertension: Secondary | ICD-10-CM | POA: Diagnosis not present

## 2014-10-11 DIAGNOSIS — H2513 Age-related nuclear cataract, bilateral: Secondary | ICD-10-CM | POA: Diagnosis not present

## 2014-10-11 DIAGNOSIS — H40053 Ocular hypertension, bilateral: Secondary | ICD-10-CM | POA: Diagnosis not present

## 2014-10-12 ENCOUNTER — Encounter: Payer: Medicare Other | Admitting: Cardiovascular Disease

## 2014-10-13 DIAGNOSIS — R312 Other microscopic hematuria: Secondary | ICD-10-CM | POA: Diagnosis not present

## 2014-11-09 ENCOUNTER — Telehealth: Payer: Self-pay | Admitting: Cardiovascular Disease

## 2014-11-09 ENCOUNTER — Other Ambulatory Visit: Payer: Self-pay | Admitting: *Deleted

## 2014-11-09 MED ORDER — OMEGA-3-ACID ETHYL ESTERS 1 G PO CAPS: 2.0000 g | ORAL_CAPSULE | Freq: Two times a day (BID) | ORAL | Status: DC

## 2014-11-09 NOTE — Telephone Encounter (Signed)
David [name] is calling because he wants to know based on all the different medications he take is it all right to take tyenol arthritis pain. Please call   Thanks

## 2014-11-09 NOTE — Telephone Encounter (Signed)
Returned call to patient spoke to wife advised ok to take Tylenol for arthritis pain.

## 2014-11-09 NOTE — Telephone Encounter (Signed)
Rx(s) sent to pharmacy electronically.  

## 2014-11-12 DIAGNOSIS — M25552 Pain in left hip: Secondary | ICD-10-CM | POA: Diagnosis not present

## 2014-11-19 ENCOUNTER — Other Ambulatory Visit: Payer: Self-pay | Admitting: Family Medicine

## 2014-11-19 ENCOUNTER — Ambulatory Visit
Admission: RE | Admit: 2014-11-19 | Discharge: 2014-11-19 | Disposition: A | Discharge status: Home / Self Care | Payer: Medicare Other | Source: Ambulatory Visit | Attending: Family Medicine | Admitting: Family Medicine

## 2014-11-19 DIAGNOSIS — M25552 Pain in left hip: Secondary | ICD-10-CM

## 2014-11-19 DIAGNOSIS — M160 Bilateral primary osteoarthritis of hip: Secondary | ICD-10-CM | POA: Diagnosis not present

## 2014-11-30 ENCOUNTER — Other Ambulatory Visit: Payer: Self-pay | Admitting: Family Medicine

## 2014-11-30 ENCOUNTER — Ambulatory Visit
Admission: RE | Admit: 2014-11-30 | Discharge: 2014-11-30 | Disposition: A | Discharge status: Home / Self Care | Payer: Medicare Other | Source: Ambulatory Visit | Attending: Family Medicine | Admitting: Family Medicine

## 2014-11-30 DIAGNOSIS — M79606 Pain in leg, unspecified: Secondary | ICD-10-CM

## 2014-11-30 DIAGNOSIS — M79605 Pain in left leg: Secondary | ICD-10-CM | POA: Diagnosis not present

## 2014-11-30 DIAGNOSIS — M5137 Other intervertebral disc degeneration, lumbosacral region: Secondary | ICD-10-CM | POA: Diagnosis not present

## 2014-11-30 DIAGNOSIS — M47817 Spondylosis without myelopathy or radiculopathy, lumbosacral region: Secondary | ICD-10-CM | POA: Diagnosis not present

## 2014-12-07 ENCOUNTER — Other Ambulatory Visit: Payer: Self-pay | Admitting: Cardiovascular Disease

## 2014-12-16 ENCOUNTER — Other Ambulatory Visit (HOSPITAL_BASED_OUTPATIENT_CLINIC_OR_DEPARTMENT_OTHER): Payer: Medicare Other

## 2014-12-16 ENCOUNTER — Telehealth: Payer: Self-pay | Admitting: Oncology

## 2014-12-16 ENCOUNTER — Ambulatory Visit (HOSPITAL_BASED_OUTPATIENT_CLINIC_OR_DEPARTMENT_OTHER): Payer: Medicare Other | Admitting: Oncology

## 2014-12-16 VITALS — BP 127/66 | HR 66 | Temp 97.7°F | Resp 18 | Ht 70.0 in | Wt 201.6 lb

## 2014-12-16 DIAGNOSIS — C8280 Other types of follicular lymphoma, unspecified site: Secondary | ICD-10-CM

## 2014-12-16 DIAGNOSIS — D6959 Other secondary thrombocytopenia: Secondary | ICD-10-CM

## 2014-12-16 DIAGNOSIS — C8590 Non-Hodgkin lymphoma, unspecified, unspecified site: Secondary | ICD-10-CM

## 2014-12-16 DIAGNOSIS — M79605 Pain in left leg: Secondary | ICD-10-CM | POA: Diagnosis not present

## 2014-12-16 DIAGNOSIS — M25552 Pain in left hip: Secondary | ICD-10-CM

## 2014-12-16 LAB — CBC WITH DIFFERENTIAL/PLATELET
BASO%: 0.2 % (ref 0.0–2.0)
Basophils Absolute: 0 10*3/uL (ref 0.0–0.1)
EOS ABS: 0.1 10*3/uL (ref 0.0–0.5)
EOS%: 2.3 % (ref 0.0–7.0)
HEMATOCRIT: 42.1 % (ref 38.4–49.9)
HEMOGLOBIN: 14.5 g/dL (ref 13.0–17.1)
LYMPH#: 0.9 10*3/uL (ref 0.9–3.3)
LYMPH%: 15.6 % (ref 14.0–49.0)
MCH: 31.8 pg (ref 27.2–33.4)
MCHC: 34.4 g/dL (ref 32.0–36.0)
MCV: 92.3 fL (ref 79.3–98.0)
MONO#: 0.5 10*3/uL (ref 0.1–0.9)
MONO%: 8.4 % (ref 0.0–14.0)
NEUT%: 73.5 % (ref 39.0–75.0)
NEUTROS ABS: 4.1 10*3/uL (ref 1.5–6.5)
Platelets: 96 10*3/uL — ABNORMAL LOW (ref 140–400)
RBC: 4.56 10*6/uL (ref 4.20–5.82)
RDW: 13.7 % (ref 11.0–14.6)
WBC: 5.6 10*3/uL (ref 4.0–10.3)
nRBC: 0 % (ref 0–0)

## 2014-12-16 NOTE — Progress Notes (Signed)
  [facility] OFFICE PROGRESS NOTE   Diagnosis: Non-Hodgkin's lymphoma  INTERVAL HISTORY:   Mr. Kraemer returns as scheduled. He feels well. No fever, night sweats, or palpable lymph nodes. Good appetite. He reports discomfort at the left anterior lower leg and left trochanter area. He is being evaluated by Dr. Alroy Dust. He reports improvement with "prednisone ".  Objective:  Vital signs in last 24 hours:  Blood pressure 127/66, pulse 66, temperature 97.7 F (36.5 C), temperature source Oral, resp. rate 18, height 5\' 10"  (1.778 m), weight 201 lb 9.6 oz (91.445 kg), SpO2 98 %.    HEENT: Neck without mass, oral cavity without visible mass Lymphatics: No cervical, supra-clavicular, axillary, or inguinal nodes Resp: Lungs are bilaterally Cardio: Regular rate and rhythm GI: No hepatosplenomegaly, nontender, no mass Vascular: No leg edema Musculoskeletal: No pain with motion at the left hip.  Neurologic: The leg strength is intact bilaterally     Lab Results:  Lab Results  Component Value Date   WBC 5.6 12/16/2014   HGB 14.5 12/16/2014   HCT 42.1 12/16/2014   MCV 92.3 12/16/2014   PLT 96* 12/16/2014   NEUTROABS 4.1 12/16/2014    Medications: I have reviewed the patient's current medications.  Assessment/Plan: 1. Non-Hodgkin's lymphoma, follicular B-cell, low grade, involving a left cervical lymph node with a staging evaluation revealing evidence for advanced stage disease. There was cervical, mediastinal, and abdominal lymphadenopathy and a question of splenic involvement on staging scans. He completed 4 cycles of fludarabine/rituximab with resolution of the palpable lymphadenopathy. The last chemotherapy was given in June 2011.  2. Skin rash following cycle #1 of fludarabine/rituximab, likely related to allopurinol or Bactrim. The Bactrim prophylaxis was discontinued. 3. Chronic Thrombocytopenia secondary chemotherapy and non-Hodgkin's lymphoma,  stable. 4. History of neutropenia secondary to chemotherapy. 5. Injection site reaction to a pneumococcal vaccine in October 2011. 6. History of coronary artery disease and peripheral vascular disease, followed by cardiology  Disposition:  He remains in clinical remission from non-Hodgkin's lymphoma. He has stable chronic thrombocytopenia. He will contact us for new symptoms and spontaneous bleeding or bruising. He will return for an office visit in 8 months. I think it is unlikely the leg/hip discomfort are related to the diagnosis of non-Hodgkin's lymphoma. He will continue evaluation of this by his primary physician.  Betsy Coder, MD  12/16/2014  9:38 AM

## 2014-12-16 NOTE — Telephone Encounter (Signed)
Gave and printed appt sched and avs fo rpt for Jan °

## 2014-12-21 ENCOUNTER — Ambulatory Visit (INDEPENDENT_AMBULATORY_CARE_PROVIDER_SITE_OTHER): Payer: Medicare Other | Admitting: Cardiovascular Disease

## 2014-12-21 ENCOUNTER — Encounter: Payer: Self-pay | Admitting: Cardiovascular Disease

## 2014-12-21 VITALS — BP 128/78 | HR 68 | Resp 16 | Ht 70.0 in | Wt 199.8 lb

## 2014-12-21 DIAGNOSIS — I4729 Other ventricular tachycardia: Secondary | ICD-10-CM

## 2014-12-21 DIAGNOSIS — I472 Ventricular tachycardia: Secondary | ICD-10-CM

## 2014-12-21 DIAGNOSIS — I10 Essential (primary) hypertension: Secondary | ICD-10-CM | POA: Diagnosis not present

## 2014-12-21 DIAGNOSIS — I255 Ischemic cardiomyopathy: Secondary | ICD-10-CM | POA: Diagnosis not present

## 2014-12-21 DIAGNOSIS — I442 Atrioventricular block, complete: Secondary | ICD-10-CM | POA: Diagnosis not present

## 2014-12-21 NOTE — Progress Notes (Signed)
Patient ID: David [name], male   DOB: [birth year], 81 y.o.   MRN: [id number]     Cardiology Office Note   Date:  12/21/2014   ID:  David [name], DOB 11-Feb-1933, MRN [id number]  PCP:  David Neer, MD  Cardiologist:   David Klein, MD   Chief Complaint  Patient presents with  . Follow-up    6 months follow-up,pt denied chest pain and SOB      History of Present Illness: David [name] is a 81 y.o. male who presents for follow-up on his pacemaker (pacemaker dependent due to complete heart block), coronary artery disease and symptomatic PVCs.  After increasing his beta blocker dose (for lengthy episode of nonsustained VT late last year) he has been completely free of symptomatic palpitations. He denies angina or dyspnea with exertion. He has not aspirin syncope or near-syncope. He denies lower extremity edema and his claudication is mild and unchanged.  Interrogation of his pacemaker shows a few issues. His dual-chamber device is approaching ERI) was implanted in 2009) and will probably require replacement in the next 3-6 months. He has unipolar atrial and ventricular leads were implanted in 1995. His atrial lead shows intermittent episodes of "noise" and over sensing, but the ventricular lead function is normal.  David [name] has had numerous coronary revascularization procedures. He was recently received a stent in the saphenous vein graft to the oblique marginal artery in July 2014. He is now almost 20 years status post coronary bypass surgery. The same bypass graft had received a stent in 2007. He has mild ischemic cardiomyopathy with an ejection fraction of 40-45%, without clinically evident congestive heart failure. The nuclear stress test on May 01, 2013 shows resolution of the previously reversible inferolateral ischemic defect and no change in the previously described scar in the apex and inferior wall. LVEF is calculated at 44%.  He had occlusion of the left popliteal artery with  distal reconstitution via collaterals. He had successful recanalization with placement of 2 stents by Dr. Andree [name] in 2015 . This led to marked improvement in his claudication He is pacemaker dependent secondary to complete heart block and his dual chamber Actor (gen change 2009) is likely to reach ERI in the next 18 months. Note that both his atrial and ventricular leads were placed in 1995 and are unipolar. (Atrial model 438-05, ventricular model 430-07). Interrogation of his device today again shows some issues with "noise" on the atrial channel but none on the ventricular channel. Overall device function is normal and he has virtually 100% atrial pacing as well as 100% ventricular pacing.  Mild carotid artery disease (50-69% proximal internal carotid artery stenosis on the right, mild plaque on the left), last duplex US was February 10, 2013.  He has a history of non-Hodgkin's lymphoma with a large neck mass but also mediastinal and abdominal lymphadenopathy treated with radiotherapy to the cervical area in 2011 followed by chemotherapy with David [name] and [name]. He is considered to be in remission.   Past Medical History  Diagnosis Date  . CAD (coronary artery disease)   . Hypertension   . Peripheral vascular disease with claudication   . Hyperlipidemia   . Left ventricular dysfunction   . Complete heart block   . Pacemaker   . Anginal pain   . Myocardial infarction 1989  . Carotid artery disease   . PAD (peripheral artery disease)   . Arthritis     "hands" (10/19/2013)  . Non Hodgkin's lymphoma 06/2010    "  excised from left neck then tx'd w/chemo"  . NSVT (nonsustained ventricular tachycardia) 06/24/2014    Max rate 200 bpm, 28-beat, nearly 9-second episode recorded by pacemaker on November 15, asymptomatic     Past Surgical History  Procedure Laterality Date  . Anal fissure repair  1990's  . 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 Insert /  replace / remove pacemaker  1995; 2002; 2009    [name]  . Superficial lymph node biopsy / excision Left 2011    "neck; turned out to be non Hodgkins lymphoma"  . Popliteal artery stent Right 10/19/2013  . Cardiac catheterization  1989  . Coronary angioplasty with stent placement  2007; 03/04/2013    ""2 +1"  . Coronary artery bypass graft  09/10/1988    "CABG X5" (03/04/2013) LIMA to LAD,SVG to imtermediate OM1 OM II,SVG to PDA  . Left heart catheterization with coronary/graft angiogram N/A 03/04/2013    Procedure: LEFT HEART CATHETERIZATION WITH [name];  Surgeon: David Sine, MD;  Location: [name];  Service: Cardiovascular;  Laterality: N/A;  . Percutaneous coronary stent intervention (pci-s)  03/04/2013    Procedure: PERCUTANEOUS CORONARY STENT INTERVENTION (PCI-S);  Surgeon: David Sine, MD;  Location: [name];  Service: Cardiovascular;;  . Lower extremity angiogram N/A 08/31/2013    Procedure: LOWER EXTREMITY ANGIOGRAM;  Surgeon: David Harp, MD;  Location: [name];  Service: Cardiovascular;  Laterality: N/A;     Current Outpatient Prescriptions  Medication Sig Dispense Refill  . acetaminophen (TYLENOL) 325 MG tablet Take 2 tablets (650 mg total) by mouth every 4 (four) hours as needed for pain or fever.    Marland Kitchen aspirin EC 81 MG tablet Take 81 mg by mouth at bedtime.    . bimatoprost (LUMIGAN) 0.01 % SOLN Place 1 drop into both eyes at bedtime.     . cilostazol (PLETAL) 100 MG tablet Take 1 tablet (100 mg total) by mouth 2 (two) times daily. 180 tablet 3  . clopidogrel (PLAVIX) 75 MG tablet Take 1 tablet (75 mg total) by mouth daily. 90 tablet 3  . CRESTOR 40 MG tablet Take 1 tablet by mouth at  bedtime 90 tablet 1  . Glucosamine-Chondroitin 750-600 MG TABS Take 1 tablet by mouth 2 (two) times daily.    Marland Kitchen lisinopril (PRINIVIL,ZESTRIL) 20 MG tablet Take 20 mg by mouth daily.    Marland Kitchen loratadine (CLARITIN) 10 MG tablet Take 10 mg by mouth daily as  needed for allergies.    . metoprolol (LOPRESSOR) 50 MG tablet Take 1.5 tablets (75 mg total) by mouth 2 (two) times daily. 270 tablet 3  . Multiple Vitamins-Minerals (SENIOR MULTIVITAMIN PLUS PO) Take 1 tablet by mouth daily.    . nitroGLYCERIN (NITROSTAT) 0.4 MG SL tablet Place 1 tablet (0.4 mg total) under the tongue every 5 (five) minutes as needed for chest pain. 25 tablet 2  . omega-3 acid ethyl esters (LOVAZA) 1 G capsule Take 2 capsules (2 g total) by mouth 2 (two) times daily. 360 capsule 1  . pantoprazole (PROTONIX) 40 MG tablet Take 1 tablet (40 mg total) by mouth daily. 90 tablet 3  . Probiotic Product (PROBIOTIC DAILY PO) Take 1 tablet by mouth daily.     No current facility-administered medications for this visit.    Allergies:   Azithromycin; Lactose intolerance (gi); Niacin and related; and Sulfa antibiotics    Social History:  The patient  reports that he quit  smoking about 58 years ago. His smoking use included Cigarettes. He has a .12 pack-year smoking history. He has never used smokeless tobacco. He reports that he drinks about 1.2 oz of alcohol per week. He reports that he does not use illicit drugs.   Family History:  The patient's family history includes Diabetes in his mother; Heart attack in his father; Hyperlipidemia in his sister and sister; Hypertension in his sister and sister; Stroke in his mother.    ROS:  Please see the history of present illness.    Otherwise, review of systems positive for none.   All other systems are reviewed and negative.    PHYSICAL EXAM: VS:  BP 128/78 mmHg  Pulse 68  Resp 16  Ht 5\' 10"  (1.778 m)  Wt 199 lb 12.8 oz (90.629 kg)  BMI 28.67 kg/m2 , BMI Body mass index is 28.67 kg/(m^2).  General: Alert, oriented x3, no distress Head: no evidence of trauma, PERRL, EOMI, no exophtalmos or lid lag, no myxedema, no xanthelasma; normal ears, nose and oropharynx Neck: normal jugular venous pulsations and no hepatojugular reflux; brisk  carotid pulses without delay and no carotid bruits Chest: clear to auscultation, no signs of consolidation by percussion or palpation, normal fremitus, symmetrical and full respiratory excursions; healed sternotomy and pacemaker scars  Cardiovascular: normal position and quality of the apical impulse, regular rhythm, normal first and second heart sounds, no murmurs, rubs or gallops Abdomen: no tenderness or distention, no masses by palpation, no abnormal pulsatility or arterial bruits, normal bowel sounds, no hepatosplenomegaly Extremities: no clubbing, cyanosis or edema; 2+ radial, ulnar and brachial pulses bilaterally; 2+ right femoral, posterior tibial and dorsalis pedis pulses; 2+ left femoral, posterior tibial and dorsalis pedis pulses; no subclavian or femoral bruits Neurological: grossly nonfocal Psych: euthymic mood, full affect   EKG:  EKG is  ordered today. The ekg ordered today demonstrates: A paced V paced rhythm    Recent Labs: 06/24/2014: ALT 27; BUN 19; Creatinine 1.20; Potassium 4.3; Sodium 140 12/16/2014: Hemoglobin 14.5; Platelets 96*    Lipid Panel    Component Value Date/Time   CHOL 119 06/24/2014 0855   TRIG 185* 06/24/2014 0855   HDL 35* 06/24/2014 0855   CHOLHDL 3.4 06/24/2014 0855   VLDL 37 06/24/2014 0855   LDLCALC 47 06/24/2014 0855      Wt Readings from Last 3 Encounters:  12/21/14 199 lb 12.8 oz (90.629 kg)  12/16/14 201 lb 9.6 oz (91.445 kg)  07/06/14 207 lb (93.895 kg)     ASSESSMENT AND PLAN:  Complete heart block  He is pacemaker dependent secondary to complete heart block and has 78 year old unipolar leads. The atrial lead showing evidence of over sensing due to "noise". The ventricular lead is still working well. He will likely reach elective replacement indicator on his pacemaker in the next several months. May have to go ahead with this earlier if there is clear evidence of interference from his pacemaker lead. He is planning a vacation to  [place] in late September. Should probably go ahead with pacemaker change out before the end of August. It'll probably be best to place new atrial and ventricular leads at the time of generator change out to allow for the benefit of he most modern pacemaker technology.  NSVT (nonsustained ventricular tachycardia) He is now free of palpitations. No evidence of recurrent NSVT on device check today  Cardiomyopathy, ischemic- EF 40-45% by echo 4/14 No clinical evidence of congestive heart failure. On appropriate therapy with  beta blockers and ace inhibitors and on high-dose statin, aspirin, clopidogrel. No indication for CRT at this point.  PVD - Lt popliteal occlusion with collaterals '09; rt popliteal successful diamondback orbital rotational atherectomy PTA and stent using an IDEV stent 10/21/13 Continues to have sustained benefit with reduction in claudication after his popliteal artery intervention.  Asymptomatic stenosis of right carotid artery without infarction No progression in moderate stenosis by repeat carotid duplex ultrasound performed a few months ago.  CAD s/p CABG 1995, s/p stent SVG-OM 2007 and July 2014 Angina free, relatively recent nuclear stress test without reversible ischemia. He should be outside the typical time range for in-stent restenosis at this point, although he is at high risk for restenosis since his most recent procedure was performed for a restenotic lesion. It is probably worthwhile to reevaluate for ischemia.   Current medicines are reviewed at length with the patient today.  The patient does not have concerns regarding medicines.  The following changes have been made:  no change  Labs/ tests ordered today include:  Orders Placed This Encounter  Procedures  . EKG 12-Lead   Patient Instructions  Dr. [name] recommends that you schedule a follow-up appointment in: 1st week of August with pacer check.        Mikael Spray, MD  12/21/2014  11:14 AM    David Klein, MD, [facility] [phone] office [phone] pager

## 2014-12-21 NOTE — Patient Instructions (Signed)
Dr. [name] recommends that you schedule a follow-up appointment in: 1st week of August with pacer check.

## 2014-12-22 LAB — CUP PACEART INCLINIC DEVICE CHECK
Brady Statistic RA Percent Paced: 100 %
Date Time Interrogation Session: 20160518105525
Lead Channel Impedance Value: 470 Ohm
Lead Channel Impedance Value: 670 Ohm
Lead Channel Pacing Threshold Amplitude: 0.7 V
Lead Channel Pacing Threshold Pulse Width: 0.4 ms
Lead Channel Pacing Threshold Pulse Width: 0.8 ms
Lead Channel Setting Pacing Amplitude: 3 V
Lead Channel Setting Pacing Pulse Width: 0.4 ms
MDC IDC MSMT BATTERY REMAINING LONGEVITY: 6 mo — AB
MDC IDC MSMT LEADCHNL RA PACING THRESHOLD AMPLITUDE: 1.5 V
MDC IDC PG SERIAL: 594681
MDC IDC SET LEADCHNL RV PACING AMPLITUDE: 2.4 V
MDC IDC SET LEADCHNL RV SENSING SENSITIVITY: 3.5 mV
MDC IDC STAT BRADY RV PERCENT PACED: 100 %

## 2015-01-17 ENCOUNTER — Encounter: Payer: Self-pay | Admitting: Cardiovascular Disease

## 2015-01-31 ENCOUNTER — Other Ambulatory Visit: Payer: Self-pay

## 2015-02-01 ENCOUNTER — Telehealth (HOSPITAL_COMMUNITY): Payer: Self-pay | Admitting: *Deleted

## 2015-02-02 ENCOUNTER — Other Ambulatory Visit: Payer: Self-pay | Admitting: Cardiovascular Disease

## 2015-02-02 DIAGNOSIS — I6529 Occlusion and stenosis of unspecified carotid artery: Secondary | ICD-10-CM

## 2015-02-11 ENCOUNTER — Ambulatory Visit (HOSPITAL_COMMUNITY)
Admission: RE | Admit: 2015-02-11 | Discharge: 2015-02-11 | Disposition: A | Discharge status: Home / Self Care | Payer: Medicare Other | Source: Ambulatory Visit | Attending: Cardiology | Admitting: Cardiology

## 2015-02-11 DIAGNOSIS — I6529 Occlusion and stenosis of unspecified carotid artery: Secondary | ICD-10-CM

## 2015-02-11 DIAGNOSIS — I6523 Occlusion and stenosis of bilateral carotid arteries: Secondary | ICD-10-CM | POA: Diagnosis not present

## 2015-02-25 DIAGNOSIS — E782 Mixed hyperlipidemia: Secondary | ICD-10-CM | POA: Diagnosis not present

## 2015-02-25 DIAGNOSIS — C8590 Non-Hodgkin lymphoma, unspecified, unspecified site: Secondary | ICD-10-CM | POA: Diagnosis not present

## 2015-02-25 DIAGNOSIS — I119 Hypertensive heart disease without heart failure: Secondary | ICD-10-CM | POA: Diagnosis not present

## 2015-02-25 DIAGNOSIS — Z0000 Encounter for general adult medical examination without abnormal findings: Secondary | ICD-10-CM | POA: Diagnosis not present

## 2015-02-25 DIAGNOSIS — K219 Gastro-esophageal reflux disease without esophagitis: Secondary | ICD-10-CM | POA: Diagnosis not present

## 2015-02-25 DIAGNOSIS — I2510 Atherosclerotic heart disease of native coronary artery without angina pectoris: Secondary | ICD-10-CM | POA: Diagnosis not present

## 2015-02-25 DIAGNOSIS — I739 Peripheral vascular disease, unspecified: Secondary | ICD-10-CM | POA: Diagnosis not present

## 2015-02-25 DIAGNOSIS — J301 Allergic rhinitis due to pollen: Secondary | ICD-10-CM | POA: Diagnosis not present

## 2015-02-28 ENCOUNTER — Telehealth: Payer: Self-pay | Admitting: Oncology

## 2015-02-28 DIAGNOSIS — B349 Viral infection, unspecified: Secondary | ICD-10-CM | POA: Diagnosis not present

## 2015-02-28 NOTE — Telephone Encounter (Signed)
Faxed pt office notes to [facility] @ [facility]

## 2015-03-01 ENCOUNTER — Emergency Department (HOSPITAL_COMMUNITY)
Admission: EM | Admit: 2015-03-01 | Discharge: 2015-03-02 | Disposition: A | Discharge status: Home / Self Care | Payer: Medicare Other | Attending: Emergency Medicine | Admitting: Emergency Medicine

## 2015-03-01 ENCOUNTER — Emergency Department (HOSPITAL_COMMUNITY): Payer: Medicare Other

## 2015-03-01 ENCOUNTER — Encounter (HOSPITAL_COMMUNITY): Payer: Self-pay | Admitting: *Deleted

## 2015-03-01 DIAGNOSIS — I25119 Atherosclerotic heart disease of native coronary artery with unspecified angina pectoris: Secondary | ICD-10-CM | POA: Diagnosis not present

## 2015-03-01 DIAGNOSIS — Z951 Presence of aortocoronary bypass graft: Secondary | ICD-10-CM | POA: Diagnosis not present

## 2015-03-01 DIAGNOSIS — M1990 Unspecified osteoarthritis, unspecified site: Secondary | ICD-10-CM | POA: Diagnosis not present

## 2015-03-01 DIAGNOSIS — R05 Cough: Secondary | ICD-10-CM | POA: Diagnosis present

## 2015-03-01 DIAGNOSIS — I10 Essential (primary) hypertension: Secondary | ICD-10-CM | POA: Diagnosis not present

## 2015-03-01 DIAGNOSIS — E785 Hyperlipidemia, unspecified: Secondary | ICD-10-CM | POA: Insufficient documentation

## 2015-03-01 DIAGNOSIS — Z7982 Long term (current) use of aspirin: Secondary | ICD-10-CM | POA: Diagnosis not present

## 2015-03-01 DIAGNOSIS — I252 Old myocardial infarction: Secondary | ICD-10-CM | POA: Diagnosis not present

## 2015-03-01 DIAGNOSIS — Z87891 Personal history of nicotine dependence: Secondary | ICD-10-CM | POA: Insufficient documentation

## 2015-03-01 DIAGNOSIS — Z950 Presence of cardiac pacemaker: Secondary | ICD-10-CM | POA: Insufficient documentation

## 2015-03-01 DIAGNOSIS — Z7902 Long term (current) use of antithrombotics/antiplatelets: Secondary | ICD-10-CM | POA: Diagnosis not present

## 2015-03-01 DIAGNOSIS — J069 Acute upper respiratory infection, unspecified: Secondary | ICD-10-CM

## 2015-03-01 DIAGNOSIS — Z79899 Other long term (current) drug therapy: Secondary | ICD-10-CM | POA: Insufficient documentation

## 2015-03-01 DIAGNOSIS — Z8572 Personal history of non-Hodgkin lymphomas: Secondary | ICD-10-CM | POA: Insufficient documentation

## 2015-03-01 DIAGNOSIS — R509 Fever, unspecified: Secondary | ICD-10-CM | POA: Diagnosis not present

## 2015-03-01 DIAGNOSIS — R001 Bradycardia, unspecified: Secondary | ICD-10-CM | POA: Insufficient documentation

## 2015-03-01 DIAGNOSIS — Z9889 Other specified postprocedural states: Secondary | ICD-10-CM | POA: Insufficient documentation

## 2015-03-01 LAB — CBC
HCT: 36.5 % — ABNORMAL LOW (ref 39.0–52.0)
Hemoglobin: 12.6 g/dL — ABNORMAL LOW (ref 13.0–17.0)
MCH: 32.1 pg (ref 26.0–34.0)
MCHC: 34.5 g/dL (ref 30.0–36.0)
MCV: 92.9 fL (ref 78.0–100.0)
PLATELETS: 141 10*3/uL — AB (ref 150–400)
RBC: 3.93 MIL/uL — ABNORMAL LOW (ref 4.22–5.81)
RDW: 13.6 % (ref 11.5–15.5)
WBC: 8.8 10*3/uL (ref 4.0–10.5)

## 2015-03-01 LAB — BASIC METABOLIC PANEL
Anion gap: 12 (ref 5–15)
BUN: 21 mg/dL — ABNORMAL HIGH (ref 6–20)
CALCIUM: 9 mg/dL (ref 8.9–10.3)
CHLORIDE: 101 mmol/L (ref 101–111)
CO2: 23 mmol/L (ref 22–32)
Creatinine, Ser: 1.43 mg/dL — ABNORMAL HIGH (ref 0.61–1.24)
GFR calc non Af Amer: 45 mL/min — ABNORMAL LOW (ref >60)
GFR, EST AFRICAN AMERICAN: 52 mL/min — AB (ref >60)
Glucose, Bld: 127 mg/dL — ABNORMAL HIGH (ref 65–99)
Potassium: 4.4 mmol/L (ref 3.5–5.1)
SODIUM: 136 mmol/L (ref 135–145)

## 2015-03-01 LAB — I-STAT TROPONIN, ED: Troponin i, poc: 0 ng/mL (ref 0.00–0.08)

## 2015-03-01 MED ORDER — POLYVINYL ALCOHOL 1.4 % OP SOLN
2.0000 [drp] | OPHTHALMIC | Status: DC | PRN
  Administered 2015-03-02: 2 [drp] via OPHTHALMIC
  Filled 2015-03-01: qty 15

## 2015-03-01 NOTE — ED Notes (Signed)
Pt states that he has been feeling bad with a cough, sore throat, fever, congeston and today at 2000 he started experiencing an irregular heart rate. States that it got down to 39 bpm and was irregular. States he has a pacemaker that is supposed to be replaced next month.

## 2015-03-01 NOTE — ED Notes (Signed)
[employer] contacted to come integrate pacemaker

## 2015-03-01 NOTE — ED Provider Notes (Signed)
CSN: [id number]     Arrival date & time 03/01/15  2027 History   First MD Initiated Contact with Patient 03/01/15 2126     Chief Complaint  Patient presents with  . Irregular Heart Beat   HPI Pt has been having trouble with a cough and congestion recently but tonight he started feeling lightheaded and had an irregular heart rate.  He took his pulse and noticed it was in the 30s. He thinks it may have lasted a few minutes., He has a pacemaker that is scheduled to be replaced next month.  [employer] , 81 years old.    Because of those symptoms he came into the ED.    Past Medical History  Diagnosis Date  . CAD (coronary artery disease)   . Hypertension   . Peripheral vascular disease with claudication   . Hyperlipidemia   . Left ventricular dysfunction   . Complete heart block   . Pacemaker   . Anginal pain   . Myocardial infarction 1989  . Carotid artery disease   . PAD (peripheral artery disease)   . Arthritis     "hands" (10/19/2013)  . Non Hodgkin's lymphoma 06/2010    "excised from left neck then tx'd w/chemo"  . NSVT (nonsustained ventricular tachycardia) 06/24/2014    Max rate 200 bpm, 28-beat, nearly 9-second episode recorded by pacemaker on November 15, asymptomatic   . Non Hodgkin's lymphoma    Past Surgical History  Procedure Laterality Date  . Anal fissure repair  1990's  . 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 Insert / replace / remove pacemaker  1995; 2002; 2009    [employer]  . Superficial lymph node biopsy / excision Left 2011    "neck; turned out to be non Hodgkins lymphoma"  . Popliteal artery stent Right 10/19/2013  . Cardiac catheterization  1989  . Coronary angioplasty with stent placement  2007; 03/04/2013    ""2 +1"  . Coronary artery bypass graft  09/10/1988    "CABG X5" (03/04/2013) LIMA to LAD,SVG to imtermediate OM1 OM II,SVG to PDA  . Left heart catheterization with coronary/graft angiogram N/A 03/04/2013     Procedure: LEFT HEART CATHETERIZATION WITH Beatrix Fetters;  Surgeon: Troy Sine, MD;  Location: [facility] CATH LAB;  Service: Cardiovascular;  Laterality: N/A;  . Percutaneous coronary stent intervention (pci-s)  03/04/2013    Procedure: PERCUTANEOUS CORONARY STENT INTERVENTION (PCI-S);  Surgeon: Troy Sine, MD;  Location: [facility] CATH LAB;  Service: Cardiovascular;;  . Lower extremity angiogram N/A 08/31/2013    Procedure: LOWER EXTREMITY ANGIOGRAM;  Surgeon: Lorretta Harp, MD;  Location: [facility] CATH LAB;  Service: Cardiovascular;  Laterality: N/A;   Family History  Problem Relation Age of Onset  . Diabetes Mother   . Stroke Mother   . Heart attack Father   . Hypertension Sister   . Hyperlipidemia Sister   . Hypertension Sister   . Hyperlipidemia Sister    History  Substance Use Topics  . Smoking status: Former Smoker -- 0.12 packs/day for 1 years    Types: Cigarettes    Quit date: 08/06/1956  . Smokeless tobacco: Never Used  . Alcohol Use: 1.2 oz/week    2 Glasses of wine per week    Review of Systems  All other systems reviewed and are negative.     Allergies  Azithromycin; Lactose intolerance (gi); Niacin and related; and Sulfa antibiotics  Home Medications   Prior to Admission medications  Medication Sig Start Date End Date Taking? Authorizing Provider  acetaminophen (TYLENOL) 325 MG tablet Take 2 tablets (650 mg total) by mouth every 4 (four) hours as needed for pain or fever. 03/05/13  Yes Erlene Quan, PA-C  aspirin EC 81 MG tablet Take 81 mg by mouth at bedtime.   Yes Historical Provider, MD  atorvastatin (LIPITOR) 20 MG tablet Take 20 mg by mouth daily.   Yes Historical Provider, MD  bimatoprost (LUMIGAN) 0.01 % SOLN Place 1 drop into both eyes at bedtime.    Yes Historical Provider, MD  cilostazol (PLETAL) 100 MG tablet Take 1 tablet (100 mg total) by mouth 2 (two) times daily. 06/01/14  Yes Lorretta Harp, MD  clopidogrel (PLAVIX) 75 MG tablet Take 1  tablet (75 mg total) by mouth daily. 02/01/14  Yes Mihai Croitoru, MD  CRESTOR 40 MG tablet Take 1 tablet by mouth at  bedtime 12/07/14  Yes Lorretta Harp, MD  Glucosamine-Chondroitin 750-600 MG TABS Take 1 tablet by mouth 2 (two) times daily.   Yes Historical Provider, MD  HYDROcodone-homatropine (HYCODAN) 5-1.5 MG/5ML syrup Take 5 mLs by mouth every 4 (four) hours as needed for cough.   Yes Historical Provider, MD  lisinopril (PRINIVIL,ZESTRIL) 20 MG tablet Take 20 mg by mouth daily.   Yes Historical Provider, MD  loratadine (CLARITIN) 10 MG tablet Take 10 mg by mouth at bedtime.    Yes Historical Provider, MD  metoprolol (LOPRESSOR) 50 MG tablet Take 1.5 tablets (75 mg total) by mouth 2 (two) times daily. 06/24/14  Yes Mihai Croitoru, MD  Multiple Vitamins-Minerals (SENIOR MULTIVITAMIN PLUS PO) Take 1 tablet by mouth daily.   Yes Historical Provider, MD  nitroGLYCERIN (NITROSTAT) 0.4 MG SL tablet Place 1 tablet (0.4 mg total) under the tongue every 5 (five) minutes as needed for chest pain. 03/05/13  Yes Erlene Quan, PA-C  omega-3 acid ethyl esters (LOVAZA) 1 G capsule Take 2 capsules (2 g total) by mouth 2 (two) times daily. 11/09/14  Yes Mihai Croitoru, MD  pantoprazole (PROTONIX) 40 MG tablet Take 1 tablet (40 mg total) by mouth daily. 07/09/14  Yes Mihai Croitoru, MD  Probiotic Product (PROBIOTIC DAILY PO) Take 1 tablet by mouth daily.   Yes Historical Provider, MD   BP 144/66 mmHg  Pulse 61  Temp(Src) 98.2 F (36.8 C)  Resp 18  SpO2 93% Physical Exam  Constitutional: He appears well-developed and well-nourished. No distress.  HENT:  Head: Normocephalic and atraumatic.  Right Ear: External ear normal.  Left Ear: External ear normal.  Conjunctival injection and yellow drainage   Eyes: Conjunctivae are normal. Right eye exhibits no discharge. Left eye exhibits no discharge. No scleral icterus.  Neck: Neck supple. No tracheal deviation present.  Cardiovascular: Normal rate, regular  rhythm and intact distal pulses.   Pulmonary/Chest: Effort normal and breath sounds normal. No stridor. No respiratory distress. He has no wheezes. He has no rales.  Abdominal: Soft. Bowel sounds are normal. He exhibits no distension. There is no tenderness. There is no rebound and no guarding.  Musculoskeletal: He exhibits no edema or tenderness.  Neurological: He is alert. He has normal strength. No cranial nerve deficit (no facial droop, extraocular movements intact, no slurred speech) or sensory deficit. He exhibits normal muscle tone. He displays no seizure activity. Coordination normal.  Skin: Skin is warm and dry. No rash noted.  Psychiatric: He has a normal mood and affect.  Nursing note and vitals reviewed.   ED  Course  Procedures (including critical care time) Labs Review Labs Reviewed  BASIC METABOLIC PANEL - Abnormal; Notable for the following:    Glucose, Bld 127 (*)    BUN 21 (*)    Creatinine, Ser 1.43 (*)    GFR calc non Af Amer 45 (*)    GFR calc Af Amer 52 (*)    All other components within normal limits  CBC - Abnormal; Notable for the following:    RBC 3.93 (*)    Hemoglobin 12.6 (*)    HCT 36.5 (*)    Platelets 141 (*)    All other components within normal limits  I-STAT TROPOININ, ED    Imaging Review Dg Chest 2 View  03/01/2015   CLINICAL DATA:  Cough and fever x2 days, hypertension  EXAM: CHEST - 2 VIEW  COMPARISON:  02/27/2013  FINDINGS: Lungs are clear. Heart size upper limits normal. Mildly tortuous thoracic aorta. Left subclavian pacemaker stable position, with defect in one lead at the level of the costoclavicular ligament as previously noted. Previous CABG. No pneumothorax. No effusion.  IMPRESSION: 1. No acute cardiopulmonary disease post CABG. 2. Defect in pacemaker lead   as before.   Electronically Signed   By: Lucrezia Europe M.D.   On: 03/01/2015 21:41     EKG Interpretation   Date/Time:  Tuesday March 01 2015 20:35:56 EDT Ventricular Rate:   123 PR Interval:    QRS Duration: 42 QT Interval:  190 QTC Calculation: 272 R Axis:   150 Text Interpretation:  Ventricular-paced rhythm in a pattern of bigeminy  Abnormal ECG No significant change since last tracing Confirmed by Loyce Klasen   MD-J, Brantley Wiley ([id number]) on 03/01/2015 9:26:36 PM      MDM   Final diagnoses:  Bradycardia  URI, acute    Patient has symptoms of a viral illness. He has cough and congestion and conjunctival discharge. He does not show pneumonia.  Patient had an episode of symptomatic bradycardia at home.  Discussed with cardiology, Dr Claiborne Billings.  Will have pacemaker interrogated.  In the ED the monitor shows that he has a paced rhythm so there are no issues with the function at this time.  [employer] rep evaluated pt in the ED.  Pacemaker is functioning properly.  Battery is not depleted.  Pt will follow up with his cardiologist as an outpatient.  URI sx are viral.    Symptomatic treatment.    Dorie Rank, MD 03/02/15 [phone]

## 2015-03-02 DIAGNOSIS — J069 Acute upper respiratory infection, unspecified: Secondary | ICD-10-CM | POA: Diagnosis not present

## 2015-03-02 NOTE — Discharge Instructions (Signed)
Bradycardia Bradycardia is a term for a heart rate (pulse) that, in adults, is slower than 60 beats per minute. A normal rate is 60 to 100 beats per minute. A heart rate below 60 beats per minute may be normal for some adults with healthy hearts. If the rate is too slow, the heart may have trouble pumping the volume of blood the body needs. If the heart rate gets too low, blood flow to the brain may be decreased and may make you feel lightheaded, dizzy, or faint. The heart has a natural pacemaker in the top of the heart called the SA node (sinoatrial or sinus node). This pacemaker sends out regular electrical signals to the muscle of the heart, telling the heart muscle when to beat (contract). The electrical signal travels from the upper parts of the heart (atria) through the AV node (atrioventricular node), to the lower chambers of the heart (ventricles). The ventricles squeeze, pumping the blood from your heart to your lungs and to the rest of your body. CAUSES   Problem with the heart's electrical system.  Problem with the heart's natural pacemaker.  Heart disease, damage, or infection.  Medications.  Problems with minerals and salts (electrolytes). SYMPTOMS   Fainting (syncope).  Fatigue and weakness.  Shortness of breath (dyspnea).  Chest pain (angina).  Drowsiness.  Confusion. DIAGNOSIS   An electrocardiogram (ECG) can help your caregiver determine the type of slow heart rate you have.  If the cause is not seen on an ECG, you may need to wear a heart monitor that records your heart rhythm for several hours or days.  Blood tests. TREATMENT   Electrolyte supplements.  Medications.  Withholding medication which is causing a slow heart rate.  Pacemaker placement. SEEK IMMEDIATE MEDICAL CARE IF:   You feel lightheaded or faint.  You develop an irregular heart rate.  You feel chest pain or have trouble breathing. MAKE SURE YOU:   Understand these  instructions.  Will watch your condition.  Will get help right away if you are not doing well or get worse. Document Released: 04/14/2002 Document Revised: 10/15/2011 Document Reviewed: 10/28/2013 [facility] Patient Information 2015 [place], [place]. This information is not intended to replace advice given to you by your health care provider. Make sure you discuss any questions you have with your health care provider.  Upper Respiratory Infection, Adult An upper respiratory infection (URI) is also sometimes known as the common cold. The upper respiratory tract includes the nose, sinuses, throat, trachea, and bronchi. Bronchi are the airways leading to the lungs. Most people improve within 1 week, but symptoms can last up to 2 weeks. A residual cough may last even longer.  CAUSES Many different viruses can infect the tissues lining the upper respiratory tract. The tissues become irritated and inflamed and often become very moist. Mucus production is also common. A cold is contagious. You can easily spread the virus to others by oral contact. This includes kissing, sharing a glass, coughing, or sneezing. Touching your mouth or nose and then touching a surface, which is then touched by another person, can also spread the virus. SYMPTOMS  Symptoms typically develop 1 to 3 days after you come in contact with a cold virus. Symptoms vary from person to person. They may include:  Runny nose.  Sneezing.  Nasal congestion.  Sinus irritation.  Sore throat.  Loss of voice (laryngitis).  Cough.  Fatigue.  Muscle aches.  Loss of appetite.  Headache.  Low-grade fever. DIAGNOSIS  You  might diagnose your own cold based on familiar symptoms, since most people get a cold 2 to 3 times a year. Your caregiver can confirm this based on your exam. Most importantly, your caregiver can check that your symptoms are not due to another disease such as strep throat, sinusitis, pneumonia, asthma, or  epiglottitis. Blood tests, throat tests, and X-rays are not necessary to diagnose a common cold, but they may sometimes be helpful in excluding other more serious diseases. Your caregiver will decide if any further tests are required. RISKS AND COMPLICATIONS  You may be at risk for a more severe case of the common cold if you smoke cigarettes, have chronic heart disease (such as heart failure) or lung disease (such as asthma), or if you have a weakened immune system. The very young and very old are also at risk for more serious infections. Bacterial sinusitis, middle ear infections, and bacterial pneumonia can complicate the common cold. The common cold can worsen asthma and chronic obstructive pulmonary disease (COPD). Sometimes, these complications can require emergency medical care and may be life-threatening. PREVENTION  The best way to protect against getting a cold is to practice good hygiene. Avoid oral or hand contact with people with cold symptoms. Wash your hands often if contact occurs. There is no clear evidence that vitamin C, vitamin E, echinacea, or exercise reduces the chance of developing a cold. However, it is always recommended to get plenty of rest and practice good nutrition. TREATMENT  Treatment is directed at relieving symptoms. There is no cure. Antibiotics are not effective, because the infection is caused by a virus, not by bacteria. Treatment may include:  Increased fluid intake. Sports drinks offer valuable electrolytes, sugars, and fluids.  Breathing heated mist or steam (vaporizer or shower).  Eating chicken soup or other clear broths, and maintaining good nutrition.  Getting plenty of rest.  Using gargles or lozenges for comfort.  Controlling fevers with ibuprofen or acetaminophen as directed by your caregiver.  Increasing usage of your inhaler if you have asthma. Zinc gel and zinc lozenges, taken in the first 24 hours of the common cold, can shorten the duration  and lessen the severity of symptoms. Pain medicines may help with fever, muscle aches, and throat pain. A variety of non-prescription medicines are available to treat congestion and runny nose. Your caregiver can make recommendations and may suggest nasal or lung inhalers for other symptoms.  HOME CARE INSTRUCTIONS   Only take over-the-counter or prescription medicines for pain, discomfort, or fever as directed by your caregiver.  Use a warm mist humidifier or inhale steam from a shower to increase air moisture. This may keep secretions moist and make it easier to breathe.  Drink enough water and fluids to keep your urine clear or pale yellow.  Rest as needed.  Return to work when your temperature has returned to normal or as your caregiver advises. You may need to stay home longer to avoid infecting others. You can also use a face mask and careful hand washing to prevent spread of the virus. SEEK MEDICAL CARE IF:   After the first few days, you feel you are getting worse rather than better.  You need your caregiver's advice about medicines to control symptoms.  You develop chills, worsening shortness of breath, or brown or red sputum. These may be signs of pneumonia.  You develop yellow or brown nasal discharge or pain in the face, especially when you bend forward. These may be signs of sinusitis.  You develop a fever, swollen neck glands, pain with swallowing, or white areas in the back of your throat. These may be signs of strep throat. SEEK IMMEDIATE MEDICAL CARE IF:   You have a fever.  You develop severe or persistent headache, ear pain, sinus pain, or chest pain.  You develop wheezing, a prolonged cough, cough up blood, or have a change in your usual mucus (if you have chronic lung disease).  You develop sore muscles or a stiff neck. Document Released: 01/16/2001 Document Revised: 10/15/2011 Document Reviewed: 10/28/2013 [facility] Patient Information 2015 [place], [place]. This  information is not intended to replace advice given to you by your health care provider. Make sure you discuss any questions you have with your health care provider.

## 2015-03-02 NOTE — ED Notes (Signed)
Pt A&Ox4, ambulatory at d/c with steady gait, NAD 

## 2015-03-08 ENCOUNTER — Ambulatory Visit (INDEPENDENT_AMBULATORY_CARE_PROVIDER_SITE_OTHER): Payer: Medicare Other | Admitting: Cardiovascular Disease

## 2015-03-08 ENCOUNTER — Encounter: Payer: Self-pay | Admitting: Cardiovascular Disease

## 2015-03-08 VITALS — BP 104/52 | HR 59 | Resp 16 | Ht 69.0 in | Wt 201.0 lb

## 2015-03-08 DIAGNOSIS — I25718 Atherosclerosis of autologous vein coronary artery bypass graft(s) with other forms of angina pectoris: Secondary | ICD-10-CM | POA: Diagnosis not present

## 2015-03-08 DIAGNOSIS — I6523 Occlusion and stenosis of bilateral carotid arteries: Secondary | ICD-10-CM | POA: Diagnosis not present

## 2015-03-08 DIAGNOSIS — I739 Peripheral vascular disease, unspecified: Secondary | ICD-10-CM

## 2015-03-08 DIAGNOSIS — I255 Ischemic cardiomyopathy: Secondary | ICD-10-CM | POA: Diagnosis not present

## 2015-03-08 DIAGNOSIS — I442 Atrioventricular block, complete: Secondary | ICD-10-CM

## 2015-03-08 NOTE — Progress Notes (Signed)
Patient ID: David [name], male   DOB: 07-20-1933, 81 y.o.   MRN: [id number]     Cardiology Office Note   Date:  03/08/2015   ID:  David [name], DOB 1933-05-05, MRN [id number]  PCP:  Mayra Neer, MD  Cardiologist:   Sanda Klein, MD   No chief complaint on file.     History of Present Illness: David [name] is a 81 y.o. male who presents for follow-up on his pacemaker (pacemaker dependent due to complete heart block), coronary artery disease and symptomatic PVCs.  He is no longer worth PVCs since the increase in beta blocker dose. His device has not recorded any significant new episodes of ventricular tachycardia since late last year when he had a lengthy episodes of nonsustained VT.  He recently had labs with his primary care physician, Dr. Derrill Memo.  Interrogation of his pacemaker shows that he has less than 6 months of expected generator longevity. He has 100% atrial pacing and 100% ventricular pacing. He is pacemaker dependent. Device a dual chamber [employer]. Both leads are unipolar, implanted in 1995. The ventricular lead parameters are good, but the atrial lead shows occasional "noise" and has a fairly high pacing threshold, measured today at 1.5 V at 0.8 ms  David [name] has had numerous coronary revascularization procedures. He most recently received a stent in the saphenous vein graft to the oblique marginal artery in July 2014. He is now almost 20 years status post coronary bypass surgery. The same bypass graft had received a stent in 2007. He has mild ischemic cardiomyopathy with an ejection fraction of 40-45%, without clinically evident congestive heart failure. The nuclear stress test on May 01, 2013 shows resolution of the previously reversible inferolateral ischemic defect and no change in the previously described scar in the apex and inferior wall. LVEF is calculated at 44%.  He had occlusion of the left popliteal artery with distal reconstitution via  collaterals. He had successful recanalization with placement of 2 stents by Dr. Andree Elk in 2015 . This led to marked improvement in his claudication He is pacemaker dependent secondary to complete heart block and his dual chamber Actor (gen change 2009) is likely to reach ERI in the next 18 months. Note that both his atrial and ventricular leads were placed in 1995 and are unipolar. (Atrial model 438-05, ventricular model 430-07). Interrogation of his device today again shows some issues with "noise" on the atrial channel but none on the ventricular channel. Overall device function is normal and he has virtually 100% atrial pacing as well as 100% ventricular pacing.  Mild carotid artery disease (50-69% proximal internal carotid artery stenosis on the right, mild plaque on the left), last duplex US was February 10, 2013.  He has a history of non-Hodgkin's lymphoma with a large neck mass but also mediastinal and abdominal lymphadenopathy treated with radiotherapy to the cervical area in 2011 followed by chemotherapy with Rituxan and fludarabine. He is considered to be in remission.   Past Medical History  Diagnosis Date  . CAD (coronary artery disease)   . Hypertension   . Peripheral vascular disease with claudication   . Hyperlipidemia   . Left ventricular dysfunction   . Complete heart block   . Pacemaker   . Anginal pain   . Myocardial infarction 1989  . Carotid artery disease   . PAD (peripheral artery disease)   . Arthritis     "hands" (10/19/2013)  . Non Hodgkin's lymphoma 06/2010    "  excised from left neck then tx'd w/chemo"  . NSVT (nonsustained ventricular tachycardia) 06/24/2014    Max rate 200 bpm, 28-beat, nearly 9-second episode recorded by pacemaker on November 15, asymptomatic   . Non Hodgkin's lymphoma     Past Surgical History  Procedure Laterality Date  . Anal fissure repair  1990's  . 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 Insert /  replace / remove pacemaker  1995; 2002; 2009    [employer]  . Superficial lymph node biopsy / excision Left 2011    "neck; turned out to be non Hodgkins lymphoma"  . Popliteal artery stent Right 10/19/2013  . Cardiac catheterization  1989  . Coronary angioplasty with stent placement  2007; 03/04/2013    ""2 +1"  . Coronary artery bypass graft  09/10/1988    "CABG X5" (03/04/2013) LIMA to LAD,SVG to imtermediate OM1 OM II,SVG to PDA  . Left heart catheterization with coronary/graft angiogram N/A 03/04/2013    Procedure: LEFT HEART CATHETERIZATION WITH Beatrix Fetters;  Surgeon: Troy Sine, MD;  [facility];  Service: Cardiovascular;  Laterality: N/A;  . Percutaneous coronary stent intervention (pci-s)  03/04/2013    Procedure: PERCUTANEOUS CORONARY STENT INTERVENTION (PCI-S);  Surgeon: Troy Sine, MD;  Location: [facility] CATH LAB;  Service: Cardiovascular;;  . Lower extremity angiogram N/A 08/31/2013    Procedure: LOWER EXTREMITY ANGIOGRAM;  Surgeon: Lorretta Harp, MD;  Location: [facility] CATH LAB;  Service: Cardiovascular;  Laterality: N/A;     Current Outpatient Prescriptions  Medication Sig Dispense Refill  . acetaminophen (TYLENOL) 325 MG tablet Take 2 tablets (650 mg total) by mouth every 4 (four) hours as needed for pain or fever.    Marland Kitchen aspirin EC 81 MG tablet Take 81 mg by mouth at bedtime.    . bimatoprost (LUMIGAN) 0.01 % SOLN Place 1 drop into both eyes at bedtime.     . cetirizine (ZYRTEC ALLERGY) 10 MG tablet Take 10 mg by mouth daily.    . cilostazol (PLETAL) 100 MG tablet Take 1 tablet (100 mg total) by mouth 2 (two) times daily. 180 tablet 3  . clopidogrel (PLAVIX) 75 MG tablet Take 1 tablet (75 mg total) by mouth daily. 90 tablet 3  . CRESTOR 40 MG tablet Take 1 tablet by mouth at  bedtime 90 tablet 1  . Glucosamine-Chondroitin 750-600 MG TABS Take 1 tablet by mouth 2 (two) times daily.    Marland Kitchen HYDROcodone-homatropine (HYCODAN) 5-1.5 MG/5ML syrup Take 5 mLs by  mouth every 4 (four) hours as needed for cough.    Marland Kitchen lisinopril (PRINIVIL,ZESTRIL) 20 MG tablet Take 20 mg by mouth daily.    . metoprolol (LOPRESSOR) 50 MG tablet Take 1.5 tablets (75 mg total) by mouth 2 (two) times daily. 270 tablet 3  . Multiple Vitamins-Minerals (SENIOR MULTIVITAMIN PLUS PO) Take 1 tablet by mouth daily.    . nitroGLYCERIN (NITROSTAT) 0.4 MG SL tablet Place 1 tablet (0.4 mg total) under the tongue every 5 (five) minutes as needed for chest pain. 25 tablet 2  . omega-3 acid ethyl esters (LOVAZA) 1 G capsule Take 2 capsules (2 g total) by mouth 2 (two) times daily. 360 capsule 1  . pantoprazole (PROTONIX) 40 MG tablet Take 1 tablet (40 mg total) by mouth daily. 90 tablet 3  . Probiotic Product (PROBIOTIC DAILY PO) Take 1 tablet by mouth daily.    Marland Kitchen amoxicillin (AMOXIL) 500 MG capsule Take 500 mg by mouth 2 (  two) times daily.     No current facility-administered medications for this visit.    Allergies:   Azithromycin; Lactose intolerance (gi); Niacin and related; and Sulfa antibiotics    Social History:  The patient  reports that he quit smoking about 58 years ago. His smoking use included Cigarettes. He has a .12 pack-year smoking history. He has never used smokeless tobacco. He reports that he drinks about 1.2 oz of alcohol per week. He reports that he does not use illicit drugs.   Family History:  The patient's family history includes Diabetes in his mother; Heart attack in his father; Hyperlipidemia in his sister and sister; Hypertension in his sister and sister; Stroke in his mother.    ROS:  Please see the history of present illness.    Otherwise, review of systems positive for none.   All other systems are reviewed and negative.    PHYSICAL EXAM: VS:  BP 104/52 mmHg  Pulse 59  Resp 16  Ht 5\' 9"  (1.753 m)  Wt 201 lb (91.173 kg)  BMI 29.67 kg/m2 , BMI Body mass index is 29.67 kg/(m^2).  General: Alert, oriented x3, no distress Head: no evidence of trauma,  PERRL, EOMI, no exophtalmos or lid lag, no myxedema, no xanthelasma; normal ears, nose and oropharynx Neck: normal jugular venous pulsations and no hepatojugular reflux; brisk carotid pulses without delay and no carotid bruits Chest: clear to auscultation, no signs of consolidation by percussion or palpation, normal fremitus, symmetrical and full respiratory excursions, healthy sternotomy scar and subcutaneous left subclavian pacemaker site Cardiovascular: normal position and quality of the apical impulse, regular rhythm, normal first and second heart sounds, no  murmurs, rubs or gallops Abdomen: no tenderness or distention, no masses by palpation, no abnormal pulsatility or arterial bruits, normal bowel sounds, no hepatosplenomegaly Extremities: no clubbing, cyanosis or edema; 2+ radial, ulnar and brachial pulses bilaterally; 2+ right femoral, posterior tibial and dorsalis pedis pulses; 2+ left femoral, posterior tibial and dorsalis pedis pulses; no subclavian or femoral bruits Neurological: grossly nonfocal Psych: euthymic mood, full affect   EKG:  EKG is not ordered today. Recent Labs: 06/24/2014: ALT 27 03/01/2015: BUN 21*; Creatinine, Ser 1.43*; Hemoglobin 12.6*; Platelets 141*; Potassium 4.4; Sodium 136    Lipid Panel    Component Value Date/Time   CHOL 119 06/24/2014 0855   TRIG 185* 06/24/2014 0855   HDL 35* 06/24/2014 0855   CHOLHDL 3.4 06/24/2014 0855   VLDL 37 06/24/2014 0855   LDLCALC 47 06/24/2014 0855      Wt Readings from Last 3 Encounters:  03/08/15 201 lb (91.173 kg)  12/21/14 199 lb 12.8 oz (90.629 kg)  12/16/14 201 lb 9.6 oz (91.445 kg)     ASSESSMENT AND PLAN:  Complete heart block  He is pacemaker dependent secondary to complete heart block and has 79 year old unipolar leads. The atrial lead showing evidence of over sensing due to "noise". The ventricular lead is still working well. He will likely reach elective replacement indicator on his pacemaker in the  next several months. May have to go ahead with this earlier if there is clear evidence of interference from his pacemaker lead. He is planning a vacation to [place] in late September. It'll probably be best to place new atrial and ventricular leads at the time of generator change out to allow for the benefit of the most modern pacemaker technology.  NSVT (nonsustained ventricular tachycardia) He is now free of palpitations. No evidence of recurrent NSVT on device check  today  Cardiomyopathy, ischemic- EF 40-45% by echo 4/14 No clinical evidence of congestive heart failure. On appropriate therapy with beta blockers and ace inhibitors and on high-dose statin, aspirin, clopidogrel. No indication for CRT at this point.  PVD - Lt popliteal occlusion with collaterals '09; rt popliteal successful diamondback orbital rotational atherectomy PTA and stent using an IDEV stent 10/21/13 Continues to have sustained benefit with reduction in claudication after his popliteal artery intervention.  Asymptomatic stenosis of right carotid artery without infarction No progression in moderate stenosis by repeat carotid duplex ultrasound performed a few weeks ago. Will recheck in one year  CAD s/p CABG 1995, s/p stent SVG-OM 2007 and July 2014 Angina free, relatively recent nuclear stress test without reversible ischemia. He should be outside the typical time range for in-stent restenosis at this point, although he is at high risk for restenosis since his most recent procedure was performed for a restenotic lesion. It is probably worthwhile to reevaluate for ischemia.    Current medicines are reviewed at length with the patient today.  The patient does not have concerns regarding medicines.  The following changes have been made:  no change  Labs/ tests ordered today include:  No orders of the defined types were placed in this encounter.      Patient Instructions  Your physician wants you to follow-up in: 1  Year. You will receive a reminder letter in the mail two months in advance. If you don't receive a letter, please call our office to schedule the follow-up appointment.  Your physician has requested that you have a carotid duplex. This test is an ultrasound of the carotid arteries in your neck. It looks at blood flow through these arteries that supply the brain with blood. Allow one hour for this exam. There are no restrictions or special instructions.  Your physician recommends that you schedule a follow-up appointment [facility] office for Monthly pacer check         Signed, Sanda Klein, MD  03/08/2015 5:23 PM    Sanda Klein, MD, [facility] [phone] office [phone] pager

## 2015-03-08 NOTE — Patient Instructions (Signed)
Your physician wants you to follow-up in: 1 Year. You will receive a reminder letter in the mail two months in advance. If you don't receive a letter, please call our office to schedule the follow-up appointment.  Your physician has requested that you have a carotid duplex. This test is an ultrasound of the carotid arteries in your neck. It looks at blood flow through these arteries that supply the brain with blood. Allow one hour for this exam. There are no restrictions or special instructions.  Your physician recommends that you schedule a follow-up appointment [address] office for Monthly pacer check

## 2015-03-11 LAB — CUP PACEART INCLINIC DEVICE CHECK
Date Time Interrogation Session: 20160802040000
Lead Channel Impedance Value: 480 Ohm
Lead Channel Impedance Value: 640 Ohm
Lead Channel Pacing Threshold Pulse Width: 0.4 ms
Lead Channel Pacing Threshold Pulse Width: 0.8 ms
Lead Channel Sensing Intrinsic Amplitude: 2.4 mV
Lead Channel Setting Pacing Amplitude: 2.4 V
Lead Channel Setting Pacing Amplitude: 3 V
MDC IDC MSMT LEADCHNL RA PACING THRESHOLD AMPLITUDE: 1.5 V
MDC IDC MSMT LEADCHNL RV PACING THRESHOLD AMPLITUDE: 0.7 V
MDC IDC SET LEADCHNL RV PACING PULSEWIDTH: 0.4 ms
MDC IDC SET LEADCHNL RV SENSING SENSITIVITY: 3.5 mV
MDC IDC STAT BRADY RA PERCENT PACED: 100 %
MDC IDC STAT BRADY RV PERCENT PACED: 100 %
Pulse Gen Serial Number: 594681

## 2015-03-18 ENCOUNTER — Other Ambulatory Visit: Payer: Self-pay | Admitting: Cardiovascular Disease

## 2015-03-18 NOTE — Telephone Encounter (Signed)
REFILL 

## 2015-03-31 ENCOUNTER — Other Ambulatory Visit (HOSPITAL_COMMUNITY): Payer: Self-pay | Admitting: Cardiovascular Disease

## 2015-03-31 ENCOUNTER — Other Ambulatory Visit: Payer: Self-pay | Admitting: Cardiovascular Disease

## 2015-03-31 NOTE — Telephone Encounter (Signed)
REFILL 

## 2015-04-07 ENCOUNTER — Ambulatory Visit (INDEPENDENT_AMBULATORY_CARE_PROVIDER_SITE_OTHER): Payer: Medicare Other | Admitting: *Deleted

## 2015-04-07 DIAGNOSIS — Z950 Presence of cardiac pacemaker: Secondary | ICD-10-CM

## 2015-04-07 LAB — CUP PACEART INCLINIC DEVICE CHECK
Lead Channel Setting Pacing Amplitude: 2.4 V
Lead Channel Setting Pacing Amplitude: 3 V
Lead Channel Setting Pacing Pulse Width: 0.4 ms
MDC IDC PG SERIAL: 594681
MDC IDC SESS DTM: 20160901040000
MDC IDC SET LEADCHNL RV SENSING SENSITIVITY: 3.5 mV
MDC IDC STAT BRADY RA PERCENT PACED: 70 %
MDC IDC STAT BRADY RV PERCENT PACED: 100 %

## 2015-04-07 NOTE — Progress Notes (Signed)
Pacer check in clinic for battery longevity. <0.5 year remaining. ROV with Device clinic 05/09/15, [place] with [facility] in Aug 2017.

## 2015-04-18 DIAGNOSIS — H40053 Ocular hypertension, bilateral: Secondary | ICD-10-CM | POA: Diagnosis not present

## 2015-04-18 DIAGNOSIS — H25813 Combined forms of age-related cataract, bilateral: Secondary | ICD-10-CM | POA: Diagnosis not present

## 2015-04-18 DIAGNOSIS — H04123 Dry eye syndrome of bilateral lacrimal glands: Secondary | ICD-10-CM | POA: Diagnosis not present

## 2015-04-18 DIAGNOSIS — H40013 Open angle with borderline findings, low risk, bilateral: Secondary | ICD-10-CM | POA: Diagnosis not present

## 2015-04-27 DIAGNOSIS — Z23 Encounter for immunization: Secondary | ICD-10-CM | POA: Diagnosis not present

## 2015-05-09 ENCOUNTER — Ambulatory Visit (INDEPENDENT_AMBULATORY_CARE_PROVIDER_SITE_OTHER): Payer: Medicare Other | Admitting: *Deleted

## 2015-05-09 ENCOUNTER — Encounter: Payer: Self-pay | Admitting: Cardiovascular Disease

## 2015-05-09 DIAGNOSIS — Z45010 Encounter for checking and testing of cardiac pacemaker pulse generator [battery]: Secondary | ICD-10-CM

## 2015-05-09 LAB — CUP PACEART INCLINIC DEVICE CHECK
Lead Channel Setting Pacing Amplitude: 2.4 V
Lead Channel Setting Pacing Amplitude: 3 V
Lead Channel Setting Pacing Pulse Width: 0.4 ms
Lead Channel Setting Sensing Sensitivity: 3.5 mV
MDC IDC PG SERIAL: 594681
MDC IDC SESS DTM: 20161003040000
MDC IDC STAT BRADY RA PERCENT PACED: 68 %
MDC IDC STAT BRADY RV PERCENT PACED: 100 %

## 2015-05-09 NOTE — Progress Notes (Signed)
Battery check only. Remaining longevity <0.60yrs. Noise episodes on RA lead, previously known. ROV w/ device clinic 06/15/15, billable.

## 2015-05-24 ENCOUNTER — Encounter: Payer: Self-pay | Admitting: Cardiovascular Disease

## 2015-06-15 ENCOUNTER — Ambulatory Visit (INDEPENDENT_AMBULATORY_CARE_PROVIDER_SITE_OTHER): Payer: Medicare Other | Admitting: *Deleted

## 2015-06-15 DIAGNOSIS — I442 Atrioventricular block, complete: Secondary | ICD-10-CM | POA: Diagnosis not present

## 2015-06-15 LAB — CUP PACEART INCLINIC DEVICE CHECK
Brady Statistic RV Percent Paced: 100 %
Implantable Lead Implant Date: 19950101
Implantable Lead Implant Date: 19950101
Implantable Lead Location: 753860
Lead Channel Setting Pacing Amplitude: 2.4 V
Lead Channel Setting Pacing Amplitude: 3 V
Lead Channel Setting Sensing Sensitivity: 3.5 mV
MDC IDC LEAD LOCATION: 753859
MDC IDC SESS DTM: 20161109050000
MDC IDC SET LEADCHNL RV PACING PULSEWIDTH: 0.4 ms
MDC IDC STAT BRADY RA PERCENT PACED: 68 %
Pulse Gen Serial Number: 594681

## 2015-06-15 NOTE — Progress Notes (Signed)
Battery check only. Remaining longevity <0.75yrs. RA noise reviewed, previously noted. ROV w/ device clinc 07/20/15, next billable Feb/2017.

## 2015-06-29 ENCOUNTER — Other Ambulatory Visit (HOSPITAL_COMMUNITY): Payer: Self-pay | Admitting: Cardiovascular Disease

## 2015-06-29 ENCOUNTER — Other Ambulatory Visit: Payer: Self-pay | Admitting: Cardiovascular Disease

## 2015-06-29 MED ORDER — CILOSTAZOL 100 MG PO TABS: ORAL_TABLET | ORAL | Status: DC

## 2015-06-29 NOTE — Telephone Encounter (Signed)
°*  STAT* If patient is at the pharmacy, call can be transferred to refill team.   1. Which medications need to be refilled? (please list name of each medication and dose if known) Cilostazol  2. Which pharmacy/location (including street and city if local pharmacy) is medication to be sent to?[facility]  3. Do they need a 30 day or 90 day supply? [place]

## 2015-06-29 NOTE — Telephone Encounter (Signed)
Pt's Rx was sent to pt's pharmacy as requested. Confirmation received.  °

## 2015-07-04 ENCOUNTER — Telehealth: Payer: Self-pay | Admitting: Cardiovascular Disease

## 2015-07-04 NOTE — Telephone Encounter (Signed)
Returned call to patient.He stated Pletal refill already sent to pharmacy.Stated he got a call from [facility] this morning that Dr.Berry needed to speak to him before refilling pletal.Stated he got a call from [facility] this afternoon pletal being refilled.After reviewing chart he does need a follow up appointment with Dr.Berry.Appointment scheduled 07/26/15 at 8:45 am.I don't see in chart that Dr.Berry wanted to speak to you.I will send message to Dr.Berry's nurse Maudie Mercury.

## 2015-07-04 NOTE — Telephone Encounter (Signed)
He said he received e-mail  from pharmacy-saying he need to talk to his doctor concerning his Cilostazol.

## 2015-07-15 ENCOUNTER — Encounter: Payer: Self-pay | Admitting: Cardiovascular Disease

## 2015-07-20 ENCOUNTER — Ambulatory Visit (INDEPENDENT_AMBULATORY_CARE_PROVIDER_SITE_OTHER): Payer: Medicare Other | Admitting: *Deleted

## 2015-07-20 DIAGNOSIS — Z45010 Encounter for checking and testing of cardiac pacemaker pulse generator [battery]: Secondary | ICD-10-CM

## 2015-07-20 NOTE — Progress Notes (Signed)
Pacemaker battery check in clinic. <0.5 yr remaining. ROV with device clinic 08/24/15 for battery check (next billable Feb). ROV with [place] in August.

## 2015-07-26 ENCOUNTER — Telehealth (HOSPITAL_COMMUNITY): Payer: Self-pay

## 2015-07-26 ENCOUNTER — Ambulatory Visit (INDEPENDENT_AMBULATORY_CARE_PROVIDER_SITE_OTHER): Payer: Medicare Other | Admitting: Cardiovascular Disease

## 2015-07-26 ENCOUNTER — Encounter: Payer: Self-pay | Admitting: Cardiovascular Disease

## 2015-07-26 VITALS — BP 134/66 | HR 59 | Ht 69.0 in | Wt 208.0 lb

## 2015-07-26 DIAGNOSIS — I6521 Occlusion and stenosis of right carotid artery: Secondary | ICD-10-CM | POA: Diagnosis not present

## 2015-07-26 DIAGNOSIS — I255 Ischemic cardiomyopathy: Secondary | ICD-10-CM | POA: Diagnosis not present

## 2015-07-26 DIAGNOSIS — I739 Peripheral vascular disease, unspecified: Secondary | ICD-10-CM

## 2015-07-26 DIAGNOSIS — I6523 Occlusion and stenosis of bilateral carotid arteries: Secondary | ICD-10-CM | POA: Diagnosis not present

## 2015-07-26 DIAGNOSIS — R0789 Other chest pain: Secondary | ICD-10-CM

## 2015-07-26 NOTE — Telephone Encounter (Signed)
Encounter complete. 

## 2015-07-26 NOTE — Assessment & Plan Note (Signed)
Asymptomatic moderate right ICA stenosis. His last carotid Doppler was over a year ago. I will repeat carotid Doppler studies.

## 2015-07-26 NOTE — Patient Instructions (Signed)
Medication Instructions:   NO CHANGE  Testing/Procedures:  Your physician has requested that you have a lexiscan myoview. For further information please visit [email or web address]. Please follow instruction sheet, as given.   Your physician has requested that you have a carotid duplex. This test is an ultrasound of the carotid arteries in your neck. It looks at blood flow through these arteries that supply the brain with blood. Allow one hour for this exam. There are no restrictions or special instructions.  Your physician has requested that you have a lower extremity arterial duplex. During this test, ultrasound are used to evaluate arterial blood flow in the legs. Allow one hour for this exam. There are no restrictions or special instructions.   Follow-Up:  Your physician recommends that you schedule a follow-up appointment in: AS NEEDED WITH DR Gwenlyn Found  Your physician recommends that you schedule a follow-up appointment in: 3 TO 4 MONTHS WITH DR [name]   Any Other Special Instructions Will Be Listed Below (If Applicable).

## 2015-07-26 NOTE — Assessment & Plan Note (Addendum)
History of peripheral vascular disease status post diamondback orbital rotational atherectomy, PTA  and stenting using an ID EV stent of right SFA with excellent angiographic result 08/31/13.I was unsuccessful in crossing a crit left popliteal artery CTO and sent the patient to [facility] where Dr. Brunetta Jeans was able to cross the lesion. He's had no claudication since. His procedure well was complicated by a left common femoral pseudoaneurysm which is ultimately compressed and closed. I will obtain lower extremity arterial Doppler studies to follow his PAD.

## 2015-07-26 NOTE — Progress Notes (Signed)
07/26/2015 David [name]   Jul 02, 1933  [id number]  Primary Physician David Neer, MD Primary Cardiologist: David Harp MD David [name]   HPI:  David [name] is a 81 year old married Caucasian male father of 3 children is retired Tree surgeon for a company. He was initially patient of Dr. Rush Landmark [name]'s followed by Dr. Terance [name] and now Dr. Sallyanne [name]. I last saw him in the office 12/01/13. He has a history of peripheral arterial disease with known moderate right internal carotid artery stenosis but with ultrasound as well as an occluded left popliteal artery by angiography back in 2009. He has geniculate collaterals with three-vessel runoff and lifestyle limiting claudication. His other problems include a history of hypertension and hyperlipidemia. He had coronary bypass grafting 09/10/88 and has had 3 stent procedures since most recently by Dr. Ellouise [name] on 03/04/13 which time he placed a drug-eluting stent in the obtuse marginal vein graft. He denies chest pain or shortness of breath. He is currently displaying chronic rehabilitation. Because of ongoing claudication left greater than right as well as advances in technology it was decided to attempt percutaneous revascularization of the left popliteal chronic total occlusion.I attempted this on 08/31/2013 unsuccessfully. I then performed staged right SFA diamondback orbital rotational atherectomy, PT and stenting using an IDEV stent with excellent angiographic result. Followup Dopplers performed on 11/13/13 revealed an ABI 1.1 on the right. The procedure was complicated by a left common femoral pseudoaneurysm which was compressed and ultimately closed confirmed by ultrasound. I ultimately referred him to David [name] at [facility] he was able to cross his left popliteal artery CTO successfully. Since that time he has had no claudication. He does have moderate right internal carotid artery stenosis was neurologically  asymptomatic. Dr. Sallyanne [name] follows him for his pacemaker as well. He complains of effort angina since the Fall.   Current Outpatient Prescriptions  Medication Sig Dispense Refill  . acetaminophen (TYLENOL) 325 MG tablet Take 2 tablets (650 mg total) by mouth every 4 (four) hours as needed for pain or fever.    Marland Kitchen aspirin EC 81 MG tablet Take 81 mg by mouth at bedtime.    . bimatoprost (LUMIGAN) 0.01 % SOLN Place 1 drop into both eyes at bedtime.     . cilostazol (PLETAL) 100 MG tablet Take 1 tablet by mouth two  times daily 180 tablet 2  . clopidogrel (PLAVIX) 75 MG tablet Take 1 tablet by mouth  daily 90 tablet 1  . CRESTOR 40 MG tablet Take 1 tablet by mouth at  bedtime 90 tablet 0  . Glucosamine-Chondroitin 750-600 MG TABS Take 1 tablet by mouth 2 (two) times daily.    Marland Kitchen lisinopril (PRINIVIL,ZESTRIL) 20 MG tablet Take 20 mg by mouth daily.    Marland Kitchen loratadine (CLARITIN) 10 MG tablet Take 10 mg by mouth daily as needed for allergies.    . metoprolol (LOPRESSOR) 50 MG tablet Take 1 and 1/2 tablets by  mouth two times daily 270 tablet 1  . Multiple Vitamins-Minerals (SENIOR MULTIVITAMIN PLUS PO) Take 1 tablet by mouth daily.    . nitroGLYCERIN (NITROSTAT) 0.4 MG SL tablet Place 1 tablet (0.4 mg total) under the tongue every 5 (five) minutes as needed for chest pain. 25 tablet 2  . omega-3 acid ethyl esters (LOVAZA) 1 G capsule Take 2 capsules by mouth  two times daily 360 capsule 1  . pantoprazole (PROTONIX) 40 MG tablet Take 1 tablet by mouth  daily 90 tablet 1  .  Probiotic Product (PROBIOTIC DAILY PO) Take 1 tablet by mouth daily.     No current facility-administered medications for this visit.    Allergies  Allergen Reactions  . Azithromycin Diarrhea  . Lactose Intolerance (Gi) Diarrhea  . Niacin And Related Diarrhea  . Sulfa Antibiotics Rash    Social History   Social History  . Marital Status: Married    Spouse Name: N/A  . Number of Children: N/A  . Years of Education: N/A    Occupational History  . Not on file.   Social History Main Topics  . Smoking status: Former Smoker -- 0.12 packs/day for 1 years    Types: Cigarettes    Quit date: 08/06/1956  . Smokeless tobacco: Never Used  . Alcohol Use: 1.2 oz/week    2 Glasses of wine per week  . Drug Use: No  . Sexual Activity: No   Other Topics Concern  . Not on file   Social History Narrative     Review of Systems: General: negative for chills, fever, night sweats or weight changes.  Cardiovascular: negative for chest pain, dyspnea on exertion, edema, orthopnea, palpitations, paroxysmal nocturnal dyspnea or shortness of breath Dermatological: negative for rash Respiratory: negative for cough or wheezing Urologic: negative for hematuria Abdominal: negative for nausea, vomiting, diarrhea, bright red blood per rectum, melena, or hematemesis Neurologic: negative for visual changes, syncope, or dizziness All other systems reviewed and are otherwise negative except as noted above.    Blood pressure 134/66, pulse 59, height 5\' 9"  (1.753 m), weight 208 lb (94.348 kg).  General appearance: alert and no distress Neck: no adenopathy, no JVD, supple, symmetrical, trachea midline, thyroid not enlarged, symmetric, no tenderness/mass/nodules and rright carotid bruit Lungs: clear to auscultation bilaterally Heart: regular rate and rhythm, S1, S2 normal, no murmur, click, rub or gallop Extremities: extremities normal, atraumatic, no cyanosis or edema  EKG not performed today  ASSESSMENT AND PLAN:   PVD - Lt popliteal occlusion with collaterals '09; rt popliteal successful diamondback orbital rotational atherectomy PTA and stent using an IDEV stent 10/21/13 History of peripheral vascular disease status post diamondback orbital rotational atherectomy, PTA  and stenting using an ID EV stent of right SFA with excellent angiographic result 08/31/13.I was unsuccessful in crossing a crit left popliteal artery CTO and  sent the patient to [facility] where David [name] was able to cross the lesion. He's had no claudication since. His procedure well was complicated by a left common femoral pseudoaneurysm which is ultimately compressed and closed. I will obtain lower extremity arterial Doppler studies to follow his PAD.  Asymptomatic stenosis of right carotid artery without infarction Asymptomatic moderate right ICA stenosis. His last carotid Doppler was over a year ago. I will repeat carotid Doppler studies.      David Harp MD FACP,FACC,FAHA, [facility] 07/26/2015 9:17 AM

## 2015-07-27 ENCOUNTER — Other Ambulatory Visit: Payer: Self-pay | Admitting: Cardiovascular Disease

## 2015-07-27 DIAGNOSIS — I739 Peripheral vascular disease, unspecified: Secondary | ICD-10-CM

## 2015-07-28 ENCOUNTER — Ambulatory Visit (HOSPITAL_COMMUNITY)
Admission: RE | Admit: 2015-07-28 | Discharge: 2015-07-28 | Disposition: A | Discharge status: Home / Self Care | Payer: Medicare Other | Source: Ambulatory Visit | Attending: Cardiovascular Disease | Admitting: Cardiovascular Disease

## 2015-07-28 DIAGNOSIS — I2510 Atherosclerotic heart disease of native coronary artery without angina pectoris: Secondary | ICD-10-CM | POA: Diagnosis not present

## 2015-07-28 DIAGNOSIS — Z951 Presence of aortocoronary bypass graft: Secondary | ICD-10-CM | POA: Insufficient documentation

## 2015-07-28 DIAGNOSIS — R9439 Abnormal result of other cardiovascular function study: Secondary | ICD-10-CM | POA: Diagnosis not present

## 2015-07-28 DIAGNOSIS — R0789 Other chest pain: Secondary | ICD-10-CM | POA: Diagnosis not present

## 2015-07-28 DIAGNOSIS — Z8249 Family history of ischemic heart disease and other diseases of the circulatory system: Secondary | ICD-10-CM | POA: Insufficient documentation

## 2015-07-28 DIAGNOSIS — I10 Essential (primary) hypertension: Secondary | ICD-10-CM | POA: Insufficient documentation

## 2015-07-28 DIAGNOSIS — Z87891 Personal history of nicotine dependence: Secondary | ICD-10-CM | POA: Insufficient documentation

## 2015-07-28 DIAGNOSIS — I252 Old myocardial infarction: Secondary | ICD-10-CM | POA: Insufficient documentation

## 2015-07-28 LAB — MYOCARDIAL PERFUSION IMAGING
CHL CUP NUCLEAR SDS: 0
CHL CUP NUCLEAR SRS: 30
CHL CUP RESTING HR STRESS: 60 {beats}/min
LV sys vol: 59 mL
LVDIAVOL: 105 mL
Peak HR: 82 {beats}/min
SSS: 30
TID: 1.11

## 2015-07-28 MED ORDER — TECHNETIUM TC 99M SESTAMIBI GENERIC - CARDIOLITE
10.2000 | Freq: Once | INTRAVENOUS | Status: AC | PRN
  Administered 2015-07-28: 10 via INTRAVENOUS

## 2015-07-28 MED ORDER — AMINOPHYLLINE 25 MG/ML IV SOLN
75.0000 mg | Freq: Once | INTRAVENOUS | Status: AC
  Administered 2015-07-28: 75 mg via INTRAVENOUS

## 2015-07-28 MED ORDER — TECHNETIUM TC 99M SESTAMIBI GENERIC - CARDIOLITE
32.0000 | Freq: Once | INTRAVENOUS | Status: AC | PRN
  Administered 2015-07-28: 32 via INTRAVENOUS

## 2015-07-28 MED ORDER — REGADENOSON 0.4 MG/5ML IV SOLN
0.4000 mg | Freq: Once | INTRAVENOUS | Status: AC
  Administered 2015-07-28: 0.4 mg via INTRAVENOUS

## 2015-08-05 ENCOUNTER — Ambulatory Visit (HOSPITAL_COMMUNITY)
Admission: RE | Admit: 2015-08-05 | Discharge: 2015-08-05 | Disposition: A | Discharge status: Home / Self Care | Payer: Medicare Other | Source: Ambulatory Visit | Attending: Cardiovascular Disease | Admitting: Cardiovascular Disease

## 2015-08-05 ENCOUNTER — Inpatient Hospital Stay (HOSPITAL_COMMUNITY): Admission: RE | Admit: 2015-08-05 | Payer: Medicare Other | Source: Ambulatory Visit

## 2015-08-05 DIAGNOSIS — I6523 Occlusion and stenosis of bilateral carotid arteries: Secondary | ICD-10-CM | POA: Insufficient documentation

## 2015-08-05 DIAGNOSIS — I739 Peripheral vascular disease, unspecified: Secondary | ICD-10-CM | POA: Diagnosis not present

## 2015-08-05 DIAGNOSIS — E785 Hyperlipidemia, unspecified: Secondary | ICD-10-CM | POA: Diagnosis not present

## 2015-08-05 DIAGNOSIS — Z950 Presence of cardiac pacemaker: Secondary | ICD-10-CM | POA: Insufficient documentation

## 2015-08-05 DIAGNOSIS — I10 Essential (primary) hypertension: Secondary | ICD-10-CM | POA: Diagnosis not present

## 2015-08-18 ENCOUNTER — Telehealth: Payer: Self-pay | Admitting: Oncology

## 2015-08-18 ENCOUNTER — Other Ambulatory Visit (HOSPITAL_BASED_OUTPATIENT_CLINIC_OR_DEPARTMENT_OTHER): Payer: Medicare Other

## 2015-08-18 ENCOUNTER — Ambulatory Visit (HOSPITAL_BASED_OUTPATIENT_CLINIC_OR_DEPARTMENT_OTHER): Payer: Medicare Other | Admitting: Oncology

## 2015-08-18 VITALS — BP 122/65 | HR 68 | Temp 97.9°F | Resp 18 | Ht 69.0 in | Wt 206.5 lb

## 2015-08-18 DIAGNOSIS — Z8572 Personal history of non-Hodgkin lymphomas: Secondary | ICD-10-CM | POA: Diagnosis not present

## 2015-08-18 DIAGNOSIS — C8208 Follicular lymphoma grade I, lymph nodes of multiple sites: Secondary | ICD-10-CM

## 2015-08-18 DIAGNOSIS — C8590 Non-Hodgkin lymphoma, unspecified, unspecified site: Secondary | ICD-10-CM

## 2015-08-18 LAB — CBC WITH DIFFERENTIAL/PLATELET
BASO%: 0.5 % (ref 0.0–2.0)
Basophils Absolute: 0 10*3/uL (ref 0.0–0.1)
EOS%: 6.1 % (ref 0.0–7.0)
Eosinophils Absolute: 0.3 10*3/uL (ref 0.0–0.5)
HCT: 40.6 % (ref 38.4–49.9)
HEMOGLOBIN: 14.1 g/dL (ref 13.0–17.1)
LYMPH#: 0.9 10*3/uL (ref 0.9–3.3)
LYMPH%: 20 % (ref 14.0–49.0)
MCH: 31.1 pg (ref 27.2–33.4)
MCHC: 34.7 g/dL (ref 32.0–36.0)
MCV: 89.6 fL (ref 79.3–98.0)
MONO#: 0.3 10*3/uL (ref 0.1–0.9)
MONO%: 7.7 % (ref 0.0–14.0)
NEUT#: 2.9 10*3/uL (ref 1.5–6.5)
NEUT%: 65.7 % (ref 39.0–75.0)
NRBC: 0 % (ref 0–0)
Platelets: 126 10*3/uL — ABNORMAL LOW (ref 140–400)
RBC: 4.53 10*6/uL (ref 4.20–5.82)
RDW: 13.4 % (ref 11.0–14.6)
WBC: 4.4 10*3/uL (ref 4.0–10.3)

## 2015-08-18 NOTE — Telephone Encounter (Signed)
Gave patient avs report and appointments for October  °

## 2015-08-18 NOTE — Progress Notes (Signed)
  [place] OFFICE PROGRESS NOTE   Diagnosis: Non-Hodgkin's lymphoma  INTERVAL HISTORY:   David [name] returns as scheduled. He feels well. He is being scheduled for replacement of his pacemaker in the near future. No palpable lymph nodes. He had an ample is a vaccine. He reports becoming "sick "when he received a pneumococcal vaccine here in 2011.  Objective:  Vital signs in last 24 hours:  Blood pressure 122/65, pulse 68, temperature 97.9 F (36.6 C), temperature source Oral, resp. rate 18, height 5\' 9"  (1.753 m), weight 206 lb 8 oz (93.668 kg), SpO2 99 %.    HEENT: Neck without mass, oral cavity without visible mass Lymphatics: No cervical, supra-clavicular, axillary, or inguinal nodes Resp: Lungs clear bilaterally Cardio: Regular rate and rhythm GI: No hepatosplenomegaly, no mass, nontender Vascular: No leg edema   Lab Results:  Lab Results  Component Value Date   WBC 4.4 08/18/2015   HGB 14.1 08/18/2015   HCT 40.6 08/18/2015   MCV 89.6 08/18/2015   PLT 126* 08/18/2015   NEUTROABS 2.9 08/18/2015    Medications: I have reviewed the patient's current medications.  Assessment/Plan: 1. Non-Hodgkin's lymphoma, follicular B-cell, low grade, involving a left cervical lymph node with a staging evaluation revealing evidence for advanced stage disease. There was cervical, mediastinal, and abdominal lymphadenopathy and a question of splenic involvement on staging scans. He completed 4 cycles of fludarabine/rituximab with resolution of the palpable lymphadenopathy. The last chemotherapy was given in June 2011.  2. Skin rash following cycle #1 of fludarabine/rituximab, likely related to allopurinol or Bactrim. The Bactrim prophylaxis was discontinued. 3. Chronic Thrombocytopenia secondary chemotherapy and non-Hodgkin's lymphoma, stable. 4. History of neutropenia secondary to chemotherapy. 5. Injection site reaction to a pneumococcal vaccine in October  2011. 6. History of coronary artery disease and peripheral vascular disease, followed by cardiology    Disposition: David [name] is in clinical remission from non-Hodgkin's lymphoma. He would like to continue follow-up at the [facility]. He will return for an office visit in 9 months.  I recommended he receive the 13 valent pneumococcal vaccine with Dr. Brigitte Pulse.       Betsy Coder, MD  08/18/2015  9:41 AM

## 2015-08-26 ENCOUNTER — Encounter: Payer: Self-pay | Admitting: Cardiovascular Disease

## 2015-08-26 ENCOUNTER — Ambulatory Visit (INDEPENDENT_AMBULATORY_CARE_PROVIDER_SITE_OTHER): Payer: Medicare Other | Admitting: *Deleted

## 2015-08-26 DIAGNOSIS — I442 Atrioventricular block, complete: Secondary | ICD-10-CM

## 2015-08-26 DIAGNOSIS — Z950 Presence of cardiac pacemaker: Secondary | ICD-10-CM

## 2015-08-26 LAB — CUP PACEART INCLINIC DEVICE CHECK
Brady Statistic RA Percent Paced: 70 %
Brady Statistic RV Percent Paced: 100 %
Implantable Lead Location: 753859
Lead Channel Setting Sensing Sensitivity: 3.5 mV
MDC IDC LEAD IMPLANT DT: 19950101
MDC IDC LEAD IMPLANT DT: 19950101
MDC IDC LEAD LOCATION: 753860
MDC IDC SESS DTM: 20170120050000
MDC IDC SET LEADCHNL RA PACING AMPLITUDE: 3 V
MDC IDC SET LEADCHNL RV PACING AMPLITUDE: 2.4 V
MDC IDC SET LEADCHNL RV PACING PULSEWIDTH: 0.4 ms
Pulse Gen Serial Number: 594681

## 2015-08-26 NOTE — Progress Notes (Signed)
Pacemaker battery check in clinic. <0.5 yr remaining. ROV with device clinic 09/26/15 for battery check (next billable Feb). ROV with [facility] in 10/2015.

## 2015-09-05 ENCOUNTER — Telehealth: Payer: Self-pay | Admitting: Cardiovascular Disease

## 2015-09-05 NOTE — Telephone Encounter (Signed)
Please call,question about his pacemaker.

## 2015-09-05 NOTE — Telephone Encounter (Signed)
Patient wanted to know if there was anyway to expedite the process of having his ppm replaced, especially since he has a "bad lead". I explained to him that based on his most recent office visits he is not atrially dependent and that is the lead needing revision. I explained to him that his insurance would not pay for the procedure until the device declares ERI.  Patient stated that he thought that it was odd that the device is unable to give a more definitive estimate than <0.5years. I explained to him that with this type of device it will continue to say <0.5years until it is time for it to be replaced.   Patient stated that he has a trip to take next week and wanted to make sure that this would be safe. I told him that it would be fine bc even when his device reaches replacement he still has 3 months before he is in danger of losing device function. Patient voiced understanding.

## 2015-09-08 ENCOUNTER — Other Ambulatory Visit: Payer: Self-pay | Admitting: *Deleted

## 2015-09-08 MED ORDER — ROSUVASTATIN CALCIUM 40 MG PO TABS: ORAL_TABLET | ORAL | Status: DC

## 2015-09-08 NOTE — Telephone Encounter (Signed)
Generic crestor sent to [facility].

## 2015-09-23 ENCOUNTER — Ambulatory Visit (INDEPENDENT_AMBULATORY_CARE_PROVIDER_SITE_OTHER): Payer: Medicare Other | Admitting: *Deleted

## 2015-09-23 DIAGNOSIS — Z950 Presence of cardiac pacemaker: Secondary | ICD-10-CM

## 2015-09-23 DIAGNOSIS — I442 Atrioventricular block, complete: Secondary | ICD-10-CM | POA: Diagnosis not present

## 2015-09-23 LAB — CUP PACEART INCLINIC DEVICE CHECK
Brady Statistic RV Percent Paced: 100 %
Implantable Lead Implant Date: 19950101
Implantable Lead Location: 753859
Lead Channel Impedance Value: 650 Ohm
Lead Channel Pacing Threshold Amplitude: 1.6 V
Lead Channel Pacing Threshold Pulse Width: 0.4 ms
Lead Channel Pacing Threshold Pulse Width: 0.8 ms
Lead Channel Setting Pacing Amplitude: 2.4 V
Lead Channel Setting Pacing Amplitude: 3 V
Lead Channel Setting Sensing Sensitivity: 3.5 mV
MDC IDC LEAD IMPLANT DT: 19950101
MDC IDC LEAD LOCATION: 753860
MDC IDC MSMT LEADCHNL RA IMPEDANCE VALUE: 470 Ohm
MDC IDC MSMT LEADCHNL RA SENSING INTR AMPL: 2.3 mV
MDC IDC MSMT LEADCHNL RV PACING THRESHOLD AMPLITUDE: 0.7 V
MDC IDC PG SERIAL: 594681
MDC IDC SESS DTM: 20170217050000
MDC IDC SET LEADCHNL RV PACING PULSEWIDTH: 0.4 ms
MDC IDC STAT BRADY RA PERCENT PACED: 71 %

## 2015-09-23 NOTE — Progress Notes (Signed)
Pacemaker check in clinic. Normal device function aside from previously noted A lead noise. Thresholds, sensing, impedances consistent with previous measurements. Device programmed to maximize longevity. ATR episodes- A lead noise. No high ventricular rates noted. Device programmed at appropriate safety margins. Histogram distribution appropriate for patient activity level. Device programmed to optimize intrinsic conduction. Estimated longevity <0.5 year. ROV with [facility] 10/24/15 and then with device clinic 4/24 for battery check.

## 2015-10-03 ENCOUNTER — Encounter: Payer: Self-pay | Admitting: Cardiovascular Disease

## 2015-10-05 DIAGNOSIS — L309 Dermatitis, unspecified: Secondary | ICD-10-CM | POA: Diagnosis not present

## 2015-10-12 ENCOUNTER — Other Ambulatory Visit: Payer: Self-pay | Admitting: Cardiovascular Disease

## 2015-10-12 NOTE — Telephone Encounter (Signed)
REFILL 

## 2015-10-21 DIAGNOSIS — Z950 Presence of cardiac pacemaker: Secondary | ICD-10-CM | POA: Insufficient documentation

## 2015-10-21 NOTE — Progress Notes (Signed)
Patient ID: David [name], male   DOB: Apr 08, 1933, 82 y.o.   MRN: [id number]    Cardiology Office Note    Date:  10/24/2015   ID:  David [name], DOB 08-29-1932, MRN [id number]  PCP:  Mayra Neer, MD  Cardiologist:  Quay Burow, MD (PAD);  Sanda Klein, MD   Chief Complaint  Patient presents with  . Pacemaker Check    3 months  pt states no Sx.    History of Present Illness:  David [name] is a 82 y.o. male David [name] is a 82 y.o. male who presents for follow-up on his pacemaker, which is approaching ERI (pacemaker dependent due to complete heart block), coronary artery disease and symptomatic PVCs (and one episode of lengthy nonsustained VT).  Denies angina or dyspnea on exertion, intermittent claudication, leg edema, focal neurological deficits, palpitations, syncope or any other cardiovascular complaints. He is planning cataract surgery.  Interrogation of his pacemaker shows that he has less than 6 months of expected generator longevity (which has been the case for over 8 months now). He has 100% atrial pacing and 100% ventricular pacing. He is pacemaker dependent. The device is a dual chamber [employer] (2009). Both leads are unipolar, implanted in 1995. The ventricular lead parameters are good, but the atrial lead shows occasional "noise" and has a fairly high pacing threshold.  [name] has had numerous coronary revascularization procedures, most recently a stent in the saphenous vein graft to the oblique marginal artery in July 2014. He is now almost 20 years status post coronary bypass surgery. The same bypass graft had received a stent in 2007. He has mild ischemic cardiomyopathy with an ejection fraction of 40-45%, without clinical  heart failure. The nuclear stress test on July 28, 2015 shows old scar in the apex and inferior wall. LVEF is calculated at 43% (identical to 2014 and 2015).  He had occlusion of the left popliteal artery with distal  reconstitution via collaterals. He had successful recanalization with placement of 2 stents by Dr. Andree Elk in 2015 . This led to marked improvement in his claudication He is pacemaker dependent secondary to complete heart block and his dual chamber Actor (gen change 2009) is likely to reach ERI in the next 18 months. Note that both his atrial and ventricular leads were placed in 1995 and are unipolar. (Atrial model 438-05, ventricular model 430-07). Interrogation of his device today again shows some issues with "noise" on the atrial channel but none on the ventricular channel. Overall device function is normal and he has virtually 100% atrial pacing as well as 100% ventricular pacing.  Moderate carotid artery disease (stable 60-79% proximal internal carotid artery stenosis on the right, mild plaque on the left), last duplex US was February 11, 2015.  He has a history of non-Hodgkin's lymphoma with a large neck mass but also mediastinal and abdominal lymphadenopathy treated with radiotherapy to the cervical area in 2011 followed by chemotherapy with Rituxan and fludarabine. He is considered to be in remission.     Past Medical History  Diagnosis Date  . CAD (coronary artery disease)   . Hypertension   . Peripheral vascular disease with claudication ([place])   . Hyperlipidemia   . Left ventricular dysfunction   . Complete heart block ([place])   . Pacemaker   . Anginal pain ([place])   . Myocardial infarction ([place]) 1989  . Carotid artery disease ([place])   . PAD (peripheral artery disease) ([place])   .  Arthritis     "hands" (10/19/2013)  . Non Hodgkin's lymphoma ([place]) 06/2010    "excised from left neck then tx'd w/chemo"  . NSVT (nonsustained ventricular tachycardia) ([place]) 06/24/2014    Max rate 200 bpm, 28-beat, nearly 9-second episode recorded by pacemaker on November 15, asymptomatic   . Non Hodgkin's lymphoma [facility])     Past Surgical History  Procedure Laterality Date  . Anal fissure  repair  1990's  . 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 Insert / replace / remove pacemaker  1995; 2002; 2009    [employer]  . Superficial lymph node biopsy / excision Left 2011    "neck; turned out to be non Hodgkins lymphoma"  . Popliteal artery stent Right 10/19/2013  . Cardiac catheterization  1989  . Coronary angioplasty with stent placement  2007; 03/04/2013    ""2 +1"  . Coronary artery bypass graft  09/10/1988    "CABG X5" (03/04/2013) LIMA to LAD,SVG to imtermediate OM1 OM II,SVG to PDA  . Left heart catheterization with coronary/graft angiogram N/A 03/04/2013    Procedure: LEFT HEART CATHETERIZATION WITH Beatrix Fetters;  Surgeon: Troy Sine, MD;  Location: [facility] CATH LAB;  Service: Cardiovascular;  Laterality: N/A;  . Percutaneous coronary stent intervention (pci-s)  03/04/2013    Procedure: PERCUTANEOUS CORONARY STENT INTERVENTION (PCI-S);  Surgeon: Troy Sine, MD;  Location: [facility] CATH LAB;  Service: Cardiovascular;;  . Lower extremity angiogram N/A 08/31/2013    Procedure: LOWER EXTREMITY ANGIOGRAM;  Surgeon: Lorretta Harp, MD;  Location: [facility] CATH LAB;  Service: Cardiovascular;  Laterality: N/A;    Outpatient Prescriptions Prior to Visit  Medication Sig Dispense Refill  . acetaminophen (TYLENOL) 325 MG tablet Take 2 tablets (650 mg total) by mouth every 4 (four) hours as needed for pain or fever.    Marland Kitchen aspirin EC 81 MG tablet Take 81 mg by mouth at bedtime.    . bimatoprost (LUMIGAN) 0.01 % SOLN Place 1 drop into both eyes at bedtime.     . cilostazol (PLETAL) 100 MG tablet Take 1 tablet by mouth two  times daily 180 tablet 2  . clopidogrel (PLAVIX) 75 MG tablet Take 1 tablet by mouth  daily 90 tablet 0  . Glucosamine-Chondroitin 750-600 MG TABS Take 1 tablet by mouth 2 (two) times daily.    Marland Kitchen lisinopril (PRINIVIL,ZESTRIL) 20 MG tablet Take 20 mg by mouth daily.    Marland Kitchen loratadine (CLARITIN) 10 MG tablet Take 10 mg by mouth daily as needed  for allergies.    . metoprolol (LOPRESSOR) 50 MG tablet Take 1 and 1/2 tablets by  mouth two times daily 270 tablet 1  . Multiple Vitamins-Minerals (SENIOR MULTIVITAMIN PLUS PO) Take 1 tablet by mouth daily.    . nitroGLYCERIN (NITROSTAT) 0.4 MG SL tablet Place 1 tablet (0.4 mg total) under the tongue every 5 (five) minutes as needed for chest pain. 25 tablet 2  . omega-3 acid ethyl esters (LOVAZA) 1 G capsule Take 2 capsules by mouth  two times daily 360 capsule 1  . pantoprazole (PROTONIX) 40 MG tablet Take 1 tablet by mouth  daily 90 tablet 1  . rosuvastatin (CRESTOR) 40 MG tablet Take 1 tablet by mouth at  bedtime 90 tablet 1  . Probiotic Product (PROBIOTIC DAILY PO) Take 1 tablet by mouth daily.     No facility-administered medications prior to visit.     Allergies:   Azithromycin; Lactose intolerance (gi); Niacin and related;  and Sulfa antibiotics   Social History   Social History  . Marital Status: Married    Spouse Name: N/A  . Number of Children: N/A  . Years of Education: N/A   Social History Main Topics  . Smoking status: Former Smoker -- 0.12 packs/day for 1 years    Types: Cigarettes    Quit date: 08/06/1956  . Smokeless tobacco: Never Used  . Alcohol Use: 1.2 oz/week    2 Glasses of wine per week  . Drug Use: No  . Sexual Activity: No   Other Topics Concern  . None   Social History Narrative     Family History:  The patient's family history includes Diabetes in his mother; Heart attack in his father; Hyperlipidemia in his sister and sister; Hypertension in his sister and sister; Stroke in his mother.   ROS:   Please see the history of present illness.    ROS All other systems reviewed and are negative.   PHYSICAL EXAM:   VS:  BP 129/76 mmHg  Pulse 64  Ht 5' 9.75" (1.772 m)  Wt 95.981 kg (211 lb 9.6 oz)  BMI 30.57 kg/m2   GEN: Well nourished, well developed, in no acute distress HEENT: normal Neck: no JVD, carotid bruits, or masses Cardiac: RRR; no  murmurs, rubs, or gallops,no edema  Respiratory:  clear to auscultation bilaterally, normal work of breathing GI: soft, nontender, nondistended, + BS MS: no deformity or atrophy Skin: warm and dry, no rash Neuro:  Alert and Oriented x 3, Strength and sensation are intact Psych: euthymic mood, full affect  Wt Readings from Last 3 Encounters:  10/24/15 95.981 kg (211 lb 9.6 oz)  08/18/15 93.668 kg (206 lb 8 oz)  07/28/15 94.348 kg (208 lb)      Studies/Labs Reviewed:   EKG:  EKG is not ordered today.    Recent Labs: 03/01/2015: BUN 21*; Creatinine, Ser 1.43*; Potassium 4.4; Sodium 136 08/18/2015: HGB 14.1; Platelets 126*   Lipid Panel    Component Value Date/Time   CHOL 119 06/24/2014 0855   TRIG 185* 06/24/2014 0855   HDL 35* 06/24/2014 0855   CHOLHDL 3.4 06/24/2014 0855   VLDL 37 06/24/2014 0855   LDLCALC 47 06/24/2014 0855   ASSESSMENT:    1. Complete heart block- PTVDP '95 with Gen change '02 and '09 (BS). Pacer dependednt   2. Pacemaker   3. Coronary artery disease involving autologous vein coronary bypass graft with other forms of angina pectoris ([place])   4. Cardiomyopathy, ischemic- EF 40-45% by echo 4/14   5. Essential hypertension   6. NSVT (nonsustained ventricular tachycardia) (H[facility])   7. Asymptomatic stenosis of right carotid artery without infarction   8. PVD - Lt popliteal occlusion with collaterals '09; rt popliteal successful diamondback orbital rotational atherectomy PTA and stent using an IDEV stent 10/21/13   9. Dyslipidemia   10. Grade 1 follicular lymphoma of lymph nodes of multiple regions [facility])      PLAN:  In order of problems listed above:  1. Complete heart block: He is pacemaker dependent secondary to complete heart block. 2. PM: almost at [facility]. He has 80 year old unipolar leads. The atrial lead showing evidence of over sensing due to "noise". The ventricular lead is still working well. He will likely reach elective replacement indicator on his  pacemaker in the next several months. It'll probably be best to place new atrial and ventricular leads at the time of generator change out to allow for the benefit  of the most modern pacemaker technology. Note that he was symptomatic when the device switched to VVI last time (shortness of breath walking upstairs) 3. CAD s/p CABG 1995, s/p stent SVG-OM 2007 and July 2014 Angina free, very recent nuclear stress test without reversible ischemia. He should be outside the typical time range for in-stent restenosis at this point. If he is to undergo surgery, I do not think there would be any problem with temporarily discontinuing clopidogrel. 4. Cardiomyopathy, ischemic - EF 43%  No clinical evidence of congestive heart failure. On appropriate therapy with beta blockers and ace inhibitors and on high-dose statin, aspirin, clopidogrel. No indication for CRT at this point. 5. HTN: well controlled 6. NSVT (nonsustained ventricular tachycardia): no evidence of recurrent NSVT on device check today 7. Asymptomatic stenosis of right carotid artery without infarction Stable moderate stenosis by repeat carotid duplex ultrasound. Will recheck at one year intervals. 8. PAD - Lt popliteal occlusion with collaterals; rt popliteal successful stent 2015. Continues to have sustained benefit with reduction in claudication after his popliteal artery intervention. 9. HLP: Most recent lipid profile was excellent 10. Lymphoma in remission  Medication Adjustments/Labs and Tests Ordered: Current medicines are reviewed at length with the patient today.  Concerns regarding medicines are outlined above.  Medication changes, Labs and Tests ordered today are listed in the Patient Instructions below. Patient Instructions  Dr [name] recommends that you continue on your current medications as directed. Please refer to the Current Medication list given to you today.  Your physician recommends that you schedule a follow-up appointment  in 1 month in the device clinic at [facility].  Dr Calise Dunckel recommends that you schedule a follow-up appointment in 6 months. You will receive a reminder letter in the mail two months in advance. If you don't receive a letter, please call our office to schedule the follow-up appointment.  If you need a refill on your cardiac medications before your next appointment, please call your pharmacy.       Mikael Spray, MD  10/24/2015 8:53 AM    [facility] [place], [place], [place]  [address] Phone: [phone]; Fax: [phone]

## 2015-10-24 ENCOUNTER — Encounter: Payer: Self-pay | Admitting: Cardiovascular Disease

## 2015-10-24 ENCOUNTER — Ambulatory Visit (INDEPENDENT_AMBULATORY_CARE_PROVIDER_SITE_OTHER): Payer: Medicare Other | Admitting: Cardiovascular Disease

## 2015-10-24 VITALS — BP 129/76 | HR 64 | Ht 69.75 in | Wt 211.6 lb

## 2015-10-24 DIAGNOSIS — I739 Peripheral vascular disease, unspecified: Secondary | ICD-10-CM

## 2015-10-24 DIAGNOSIS — I25718 Atherosclerosis of autologous vein coronary artery bypass graft(s) with other forms of angina pectoris: Secondary | ICD-10-CM

## 2015-10-24 DIAGNOSIS — Z950 Presence of cardiac pacemaker: Secondary | ICD-10-CM

## 2015-10-24 DIAGNOSIS — C8208 Follicular lymphoma grade I, lymph nodes of multiple sites: Secondary | ICD-10-CM

## 2015-10-24 DIAGNOSIS — E785 Hyperlipidemia, unspecified: Secondary | ICD-10-CM

## 2015-10-24 DIAGNOSIS — I442 Atrioventricular block, complete: Secondary | ICD-10-CM | POA: Diagnosis not present

## 2015-10-24 DIAGNOSIS — I10 Essential (primary) hypertension: Secondary | ICD-10-CM

## 2015-10-24 DIAGNOSIS — I255 Ischemic cardiomyopathy: Secondary | ICD-10-CM

## 2015-10-24 DIAGNOSIS — I4729 Other ventricular tachycardia: Secondary | ICD-10-CM

## 2015-10-24 DIAGNOSIS — I6521 Occlusion and stenosis of right carotid artery: Secondary | ICD-10-CM

## 2015-10-24 DIAGNOSIS — I472 Ventricular tachycardia: Secondary | ICD-10-CM

## 2015-10-24 LAB — CUP PACEART INCLINIC DEVICE CHECK
Implantable Lead Implant Date: 19950101
Lead Channel Setting Pacing Amplitude: 2.4 V
Lead Channel Setting Pacing Pulse Width: 0.4 ms
Lead Channel Setting Sensing Sensitivity: 3.5 mV
MDC IDC LEAD IMPLANT DT: 19950101
MDC IDC LEAD LOCATION: 753859
MDC IDC LEAD LOCATION: 753860
MDC IDC SESS DTM: 20170323100726
MDC IDC SET LEADCHNL RA PACING AMPLITUDE: 3 V
Pulse Gen Serial Number: 594681

## 2015-10-24 NOTE — Patient Instructions (Signed)
Dr [name] recommends that you continue on your current medications as directed. Please refer to the Current Medication list given to you today.  Your physician recommends that you schedule a follow-up appointment in 1 month in the device clinic at [facility].  Dr Croitoru recommends that you schedule a follow-up appointment in 6 months. You will receive a reminder letter in the mail two months in advance. If you don't receive a letter, please call our office to schedule the follow-up appointment.  If you need a refill on your cardiac medications before your next appointment, please call your pharmacy.

## 2015-10-27 DIAGNOSIS — H43813 Vitreous degeneration, bilateral: Secondary | ICD-10-CM | POA: Diagnosis not present

## 2015-10-27 DIAGNOSIS — H04123 Dry eye syndrome of bilateral lacrimal glands: Secondary | ICD-10-CM | POA: Diagnosis not present

## 2015-10-27 DIAGNOSIS — H25813 Combined forms of age-related cataract, bilateral: Secondary | ICD-10-CM | POA: Diagnosis not present

## 2015-10-27 DIAGNOSIS — H40053 Ocular hypertension, bilateral: Secondary | ICD-10-CM | POA: Diagnosis not present

## 2015-11-01 ENCOUNTER — Other Ambulatory Visit: Payer: Self-pay | Admitting: Cardiovascular Disease

## 2015-11-01 NOTE — Telephone Encounter (Signed)
REFILL 

## 2015-11-28 ENCOUNTER — Encounter: Payer: Self-pay | Admitting: Cardiovascular Disease

## 2015-11-28 ENCOUNTER — Ambulatory Visit (INDEPENDENT_AMBULATORY_CARE_PROVIDER_SITE_OTHER): Payer: Medicare Other | Admitting: *Deleted

## 2015-11-28 ENCOUNTER — Telehealth: Payer: Self-pay

## 2015-11-28 ENCOUNTER — Telehealth: Payer: Self-pay | Admitting: Cardiovascular Disease

## 2015-11-28 DIAGNOSIS — Z01812 Encounter for preprocedural laboratory examination: Secondary | ICD-10-CM

## 2015-11-28 DIAGNOSIS — Z45010 Encounter for checking and testing of cardiac pacemaker pulse generator [battery]: Secondary | ICD-10-CM

## 2015-11-28 DIAGNOSIS — D689 Coagulation defect, unspecified: Secondary | ICD-10-CM

## 2015-11-28 DIAGNOSIS — R5383 Other fatigue: Secondary | ICD-10-CM

## 2015-11-28 DIAGNOSIS — T82110A Breakdown (mechanical) of cardiac electrode, initial encounter: Secondary | ICD-10-CM | POA: Diagnosis present

## 2015-11-28 LAB — CUP PACEART INCLINIC DEVICE CHECK
Brady Statistic RA Percent Paced: 71 %
Brady Statistic RV Percent Paced: 100 %
Date Time Interrogation Session: 20170424040000
Implantable Lead Implant Date: 19950101
Implantable Lead Location: 753860
Lead Channel Setting Pacing Amplitude: 2.4 V
Lead Channel Setting Pacing Pulse Width: 0.4 ms
MDC IDC LEAD IMPLANT DT: 19950101
MDC IDC LEAD LOCATION: 753859
MDC IDC PG SERIAL: 594681
MDC IDC SET LEADCHNL RA PACING AMPLITUDE: 3 V
MDC IDC SET LEADCHNL RV SENSING SENSITIVITY: 3.5 mV

## 2015-11-28 LAB — CBC
HEMATOCRIT: 40.7 % (ref 38.5–50.0)
Hemoglobin: 14 g/dL (ref 13.2–17.1)
MCH: 31.5 pg (ref 27.0–33.0)
MCHC: 34.4 g/dL (ref 32.0–36.0)
MCV: 91.5 fL (ref 80.0–100.0)
MPV: 8.6 fL (ref 7.5–12.5)
Platelets: 147 10*3/uL (ref 140–400)
RBC: 4.45 MIL/uL (ref 4.20–5.80)
RDW: 14.2 % (ref 11.0–15.0)
WBC: 4.9 10*3/uL (ref 3.8–10.8)

## 2015-11-28 LAB — BASIC METABOLIC PANEL
BUN: 17 mg/dL (ref 7–25)
CHLORIDE: 104 mmol/L (ref 98–110)
CO2: 27 mmol/L (ref 20–31)
CREATININE: 1.09 mg/dL (ref 0.70–1.11)
Calcium: 9.1 mg/dL (ref 8.6–10.3)
Glucose, Bld: 99 mg/dL (ref 65–99)
Potassium: 4.5 mmol/L (ref 3.5–5.3)
Sodium: 140 mmol/L (ref 135–146)

## 2015-11-28 LAB — APTT: aPTT: 31 seconds (ref 24–37)

## 2015-11-28 LAB — PROTIME-INR
INR: 0.98 (ref <1.50)
Prothrombin Time: 13.1 seconds (ref 11.6–15.2)

## 2015-11-28 LAB — TSH: TSH: 3.55 m[IU]/L (ref 0.40–4.50)

## 2015-11-28 NOTE — Telephone Encounter (Signed)
Patient aware of procedure scheduled for tomorrow. Went over pre-procedure instructions with patient. He verbalized understanding.  Patient coming to pick up lab slips for stat blood work for procedure tomorrow.

## 2015-11-28 NOTE — Telephone Encounter (Signed)
David [name] calling to report lab results ordered for stat notification are coming via fax. Verified clinic fax number.

## 2015-11-28 NOTE — Progress Notes (Signed)
Battery check only in clinic. PPM reached ERI 11/26/15. I will forward this note to Dr. [name] to arrange for generator replacement due to recent OV with [facility] 10/24/15.

## 2015-11-28 NOTE — Telephone Encounter (Signed)
David [name] is calling in with STAT lab results for the pt. Please call

## 2015-11-28 NOTE — H&P (Signed)
Cardiology Office Note    Date:  11/29/2015   ID:  David [name], DOB 06-Mar-1933, MRN [id number]  PCP:  Mayra Neer, MD  Cardiologist:   Sanda Klein, MD   No chief complaint on file.   History of Present Illness:  David [name] is a 82 y.o. male pacemaker at [facility] (pacemaker dependent due to complete heart block), also has coronary artery disease and symptomatic PVCs (and one episode of lengthy nonsustained VT).  Denies angina or dyspnea on exertion, intermittent claudication, leg edema, focal neurological deficits, palpitations, syncope or any other cardiovascular complaints. He is planning cataract surgery.  I He has 100% atrial pacing and 100% ventricular pacing. He is pacemaker dependent. The device is a dual chamber [employer] (2009). Both leads are unipolar, implanted in 1995. The ventricular lead parameters are good, but the atrial lead shows occasional "noise" and has a fairly high pacing threshold.  [name] has had numerous coronary revascularization procedures, most recently a stent in the saphenous vein graft to the oblique marginal artery in July 2014. He is now almost 20 years status post coronary bypass surgery. The same bypass graft had received a stent in 2007. He has mild ischemic cardiomyopathy with an ejection fraction of 40-45%, without clinical heart failure. The nuclear stress test on July 28, 2015 shows old scar in the apex and inferior wall. LVEF is calculated at 43% (identical to 2014 and 2015).  He had occlusion of the left popliteal artery with distal reconstitution via collaterals. He had successful recanalization with placement of 2 stents by Dr. Andree Elk in 2015 . This led to marked improvement in his claudication He is pacemaker dependent secondary to complete heart block and his dual chamber Actor (gen change 2009) is likely to reach ERI in the next 18 months. Note that both his atrial and ventricular leads were placed in  1995 and are unipolar. (Atrial model 438-05, ventricular model 430-07). Interrogation of his device today again shows some issues with "noise" on the atrial channel but none on the ventricular channel. Overall device function is normal and he has virtually 100% atrial pacing as well as 100% ventricular pacing.  Moderate carotid artery disease (stable 60-79% proximal internal carotid artery stenosis on the right, mild plaque on the left), last duplex US was February 11, 2015.  He has a history of non-Hodgkin's lymphoma with a large neck mass but also mediastinal and abdominal lymphadenopathy treated with radiotherapy to the cervical area in 2011 followed by chemotherapy with Rituxan and fludarabine. He is considered to be in remission.       Past Medical History  Diagnosis Date  . CAD (coronary artery disease)   . Hypertension   . Peripheral vascular disease with claudication ([place])   . Hyperlipidemia   . Left ventricular dysfunction   . Complete heart block ([place])   . Pacemaker   . Anginal pain ([place])   . Myocardial infarction ([place]) 1989  . Carotid artery disease ([place])   . PAD (peripheral artery disease) ([place])   . Arthritis     "hands" (10/19/2013)  . Non Hodgkin's lymphoma ([place]) 06/2010    "excised from left neck then tx'd w/chemo"  . NSVT (nonsustained ventricular tachycardia) ([place]) 06/24/2014    Max rate 200 bpm, 28-beat, nearly 9-second episode recorded by pacemaker on November 15, asymptomatic   . Non Hodgkin's lymphoma [facility])     Past Surgical History  Procedure Laterality Date  . Anal fissure repair  1990's  .  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 Insert / replace / remove pacemaker  1995; 2002; 2009    [employer]  . Superficial lymph node biopsy / excision Left 2011    "neck; turned out to be non Hodgkins lymphoma"  . Popliteal artery stent Right 10/19/2013  . Cardiac catheterization  1989  . Coronary angioplasty with stent placement  2007;  03/04/2013    ""2 +1"  . Coronary artery bypass graft  09/10/1988    "CABG X5" (03/04/2013) LIMA to LAD,SVG to imtermediate OM1 OM II,SVG to PDA  . Left heart catheterization with coronary/graft angiogram N/A 03/04/2013    Procedure: LEFT HEART CATHETERIZATION WITH Beatrix Fetters;  Surgeon: Troy Sine, MD;  Location: [facility] CATH LAB;  Service: Cardiovascular;  Laterality: N/A;  . Percutaneous coronary stent intervention (pci-s)  03/04/2013    Procedure: PERCUTANEOUS CORONARY STENT INTERVENTION (PCI-S);  Surgeon: Troy Sine, MD;  Location: [facility] CATH LAB;  Service: Cardiovascular;;  . Lower extremity angiogram N/A 08/31/2013    Procedure: LOWER EXTREMITY ANGIOGRAM;  Surgeon: Lorretta Harp, MD;  Location: [facility] CATH LAB;  Service: Cardiovascular;  Laterality: N/A;    Current Medications:  (Not in an outpatient encounter)   Allergies:   Azithromycin; Lactose intolerance (gi); Niacin and related; and Sulfa antibiotics   Social History   Social History  . Marital Status: Married    Spouse Name: N/A  . Number of Children: N/A  . Years of Education: N/A   Social History Main Topics  . Smoking status: Former Smoker -- 0.12 packs/day for 1 years    Types: Cigarettes    Quit date: 08/06/1956  . Smokeless tobacco: Never Used  . Alcohol Use: 1.2 oz/week    2 Glasses of wine per week  . Drug Use: No  . Sexual Activity: No   Other Topics Concern  . Not on file   Social History Narrative     Family History:  The patient's family history includes Diabetes in his mother; Heart attack in his father; Hyperlipidemia in his sister and sister; Hypertension in his sister and sister; Stroke in his mother.   ROS:   Please see the history of present illness.    ROS All other systems reviewed and are negative.   PHYSICAL EXAM:   VS:  SpO2 100%   GEN: Well nourished, well developed, in no acute distress HEENT: normal Neck: no JVD, carotid bruits, or masses Cardiac: Paradoxically split  second heart sound, RRR; no murmurs, rubs, or gallops,no edema  Respiratory:  clear to auscultation bilaterally, normal work of breathing GI: soft, nontender, nondistended, + BS MS: no deformity or atrophy Skin: warm and dry, no rash Neuro:  Alert and Oriented x 3, Strength and sensation are intact Psych: euthymic mood, full affect  Wt Readings from Last 3 Encounters:  10/24/15 95.981 kg (211 lb 9.6 oz)  08/18/15 93.668 kg (206 lb 8 oz)  07/28/15 94.348 kg (208 lb)      Studies/Labs Reviewed:    Recent Labs: 11/28/2015: BUN 17; Creat 1.09; Hemoglobin 14.0; Platelets 147; Potassium 4.5; Sodium 140; TSH 3.55   Lipid Panel    Component Value Date/Time   CHOL 119 06/24/2014 0855   TRIG 185* 06/24/2014 0855   HDL 35* 06/24/2014 0855   CHOLHDL 3.4 06/24/2014 0855   VLDL 37 06/24/2014 0855   LDLCALC 47 06/24/2014 0855     ASSESSMENT:    Pacemaker battery depletion in a pacemaker dependent patient  with complete heart block and a unipolar leads. Evidence of atrial lead malfunction with oversensing and relatively high pacing threshold  PLAN:   Plan pacemaker generator change out, but [name] would also benefit from implantation of 2 new bipolar leads, which would provide the benefit of modern pacing algorithms. Will start with a left upper extremity venogram. The left subclavian vein is patent, plan to place 2 new leads from this approach. Consider Tyrx pouch. If the left subclavian vein is occluded, will plan to place an entirely new system from the right side. This procedure has been fully reviewed with the patient and informed consent has been obtained.    Medication Adjustments/Labs and Tests Ordered: Current medicines are reviewed at length with the patient today.  Concerns regarding medicines are outlined above.  Medication changes, Labs and Tests ordered today are listed in the Patient Instructions below. There are no Patient Instructions on file for this visit.    Mikael Spray, MD  11/29/2015 4:12 PM    [facility] [place], [place], [place]  [address] Phone: [phone]; Fax: [phone]

## 2015-11-29 ENCOUNTER — Ambulatory Visit (HOSPITAL_COMMUNITY)
Admission: RE | Admit: 2015-11-29 | Discharge: 2015-11-30 | Disposition: A | Discharge status: Home / Self Care | Payer: Medicare Other | Source: Ambulatory Visit | Attending: Cardiovascular Disease | Admitting: Cardiovascular Disease

## 2015-11-29 ENCOUNTER — Encounter (HOSPITAL_COMMUNITY)
Admission: RE | Disposition: A | Discharge status: Home / Self Care | Payer: Self-pay | Source: Ambulatory Visit | Attending: Cardiovascular Disease

## 2015-11-29 ENCOUNTER — Encounter (HOSPITAL_COMMUNITY): Payer: Self-pay | Admitting: General Practice

## 2015-11-29 DIAGNOSIS — E785 Hyperlipidemia, unspecified: Secondary | ICD-10-CM | POA: Diagnosis not present

## 2015-11-29 DIAGNOSIS — I493 Ventricular premature depolarization: Secondary | ICD-10-CM | POA: Diagnosis not present

## 2015-11-29 DIAGNOSIS — Z45010 Encounter for checking and testing of cardiac pacemaker pulse generator [battery]: Secondary | ICD-10-CM | POA: Insufficient documentation

## 2015-11-29 DIAGNOSIS — Z7902 Long term (current) use of antithrombotics/antiplatelets: Secondary | ICD-10-CM | POA: Insufficient documentation

## 2015-11-29 DIAGNOSIS — Z9221 Personal history of antineoplastic chemotherapy: Secondary | ICD-10-CM | POA: Diagnosis not present

## 2015-11-29 DIAGNOSIS — I10 Essential (primary) hypertension: Secondary | ICD-10-CM | POA: Diagnosis not present

## 2015-11-29 DIAGNOSIS — Z87891 Personal history of nicotine dependence: Secondary | ICD-10-CM | POA: Diagnosis not present

## 2015-11-29 DIAGNOSIS — Y831 Surgical operation with implant of artificial internal device as the cause of abnormal reaction of the patient, or of later complication, without mention of misadventure at the time of the procedure: Secondary | ICD-10-CM | POA: Insufficient documentation

## 2015-11-29 DIAGNOSIS — Z8572 Personal history of non-Hodgkin lymphomas: Secondary | ICD-10-CM | POA: Diagnosis not present

## 2015-11-29 DIAGNOSIS — Z923 Personal history of irradiation: Secondary | ICD-10-CM | POA: Insufficient documentation

## 2015-11-29 DIAGNOSIS — I2510 Atherosclerotic heart disease of native coronary artery without angina pectoris: Secondary | ICD-10-CM | POA: Diagnosis not present

## 2015-11-29 DIAGNOSIS — T82110A Breakdown (mechanical) of cardiac electrode, initial encounter: Secondary | ICD-10-CM | POA: Insufficient documentation

## 2015-11-29 DIAGNOSIS — Z951 Presence of aortocoronary bypass graft: Secondary | ICD-10-CM | POA: Diagnosis not present

## 2015-11-29 DIAGNOSIS — Z79899 Other long term (current) drug therapy: Secondary | ICD-10-CM | POA: Insufficient documentation

## 2015-11-29 DIAGNOSIS — I442 Atrioventricular block, complete: Secondary | ICD-10-CM | POA: Diagnosis not present

## 2015-11-29 DIAGNOSIS — Z955 Presence of coronary angioplasty implant and graft: Secondary | ICD-10-CM | POA: Diagnosis not present

## 2015-11-29 DIAGNOSIS — Z7982 Long term (current) use of aspirin: Secondary | ICD-10-CM | POA: Insufficient documentation

## 2015-11-29 HISTORY — PX: EP IMPLANTABLE DEVICE: SHX172B

## 2015-11-29 HISTORY — PX: VENOGRAM: SHX5497

## 2015-11-29 LAB — SURGICAL PCR SCREEN
MRSA, PCR: NEGATIVE
STAPHYLOCOCCUS AUREUS: NEGATIVE

## 2015-11-29 SURGERY — PACEMAKER IMPLANT

## 2015-11-29 MED ORDER — MIDAZOLAM HCL 5 MG/5ML IJ SOLN
INTRAMUSCULAR | Status: AC
  Filled 2015-11-29: qty 5

## 2015-11-29 MED ORDER — SODIUM CHLORIDE 0.9% FLUSH: 3.0000 mL | Freq: Two times a day (BID) | INTRAVENOUS | Status: DC

## 2015-11-29 MED ORDER — SODIUM CHLORIDE 0.9 % IV SOLN
INTRAVENOUS | Status: DC
  Administered 2015-11-29: 21:00:00 via INTRAVENOUS

## 2015-11-29 MED ORDER — CEFAZOLIN SODIUM 1-5 GM-% IV SOLN
1.0000 g | Freq: Four times a day (QID) | INTRAVENOUS | Status: DC
  Administered 2015-11-30 (×2): 1 g via INTRAVENOUS
  Filled 2015-11-29 (×3): qty 50

## 2015-11-29 MED ORDER — LISINOPRIL 10 MG PO TABS
20.0000 mg | ORAL_TABLET | Freq: Every day | ORAL | Status: DC
  Administered 2015-11-30: 20 mg via ORAL
  Filled 2015-11-29: qty 2

## 2015-11-29 MED ORDER — GENTAMICIN SULFATE 40 MG/ML IJ SOLN
80.0000 mg | INTRAMUSCULAR | Status: AC
  Administered 2015-11-29: 80 mg

## 2015-11-29 MED ORDER — HEPARIN (PORCINE) IN NACL 2-0.9 UNIT/ML-% IJ SOLN
INTRAMUSCULAR | Status: AC
  Filled 2015-11-29: qty 500

## 2015-11-29 MED ORDER — MIDAZOLAM HCL 5 MG/5ML IJ SOLN
INTRAMUSCULAR | Status: DC | PRN
  Administered 2015-11-29 (×3): 1 mg via INTRAVENOUS

## 2015-11-29 MED ORDER — CHLORHEXIDINE GLUCONATE 4 % EX LIQD: 60.0000 mL | Freq: Once | CUTANEOUS | Status: DC

## 2015-11-29 MED ORDER — METOPROLOL TARTRATE 25 MG PO TABS
75.0000 mg | ORAL_TABLET | Freq: Two times a day (BID) | ORAL | Status: DC
  Administered 2015-11-30: 09:00:00 75 mg via ORAL
  Filled 2015-11-29 (×3): qty 3

## 2015-11-29 MED ORDER — HEPARIN (PORCINE) IN NACL 2-0.9 UNIT/ML-% IJ SOLN
INTRAMUSCULAR | Status: DC | PRN
  Administered 2015-11-29: 500 mL

## 2015-11-29 MED ORDER — SODIUM CHLORIDE 0.9 % IR SOLN
Status: AC
  Filled 2015-11-29: qty 2

## 2015-11-29 MED ORDER — FENTANYL CITRATE (PF) 100 MCG/2ML IJ SOLN
INTRAMUSCULAR | Status: AC
  Filled 2015-11-29: qty 2

## 2015-11-29 MED ORDER — FENTANYL CITRATE (PF) 100 MCG/2ML IJ SOLN
INTRAMUSCULAR | Status: DC | PRN
  Administered 2015-11-29 (×3): 25 ug via INTRAVENOUS

## 2015-11-29 MED ORDER — CILOSTAZOL 100 MG PO TABS
50.0000 mg | ORAL_TABLET | Freq: Two times a day (BID) | ORAL | Status: DC
  Administered 2015-11-30: 50 mg via ORAL
  Filled 2015-11-29 (×2): qty 1

## 2015-11-29 MED ORDER — CEFAZOLIN SODIUM-DEXTROSE 2-3 GM-% IV SOLR
INTRAVENOUS | Status: AC
  Filled 2015-11-29: qty 50

## 2015-11-29 MED ORDER — IOPAMIDOL (ISOVUE-370) INJECTION 76%
INTRAVENOUS | Status: DC | PRN
  Administered 2015-11-29: 20 mL via INTRAVENOUS

## 2015-11-29 MED ORDER — SODIUM CHLORIDE 0.9% FLUSH: 3.0000 mL | INTRAVENOUS | Status: DC | PRN

## 2015-11-29 MED ORDER — GLUCOSAMINE-CHONDROITIN 750-600 MG PO TABS: 1.0000 | ORAL_TABLET | Freq: Two times a day (BID) | ORAL | Status: DC

## 2015-11-29 MED ORDER — SODIUM CHLORIDE 0.9 % IV SOLN
INTRAVENOUS | Status: DC
  Administered 2015-11-29: 12:00:00 via INTRAVENOUS

## 2015-11-29 MED ORDER — CLOPIDOGREL BISULFATE 75 MG PO TABS
75.0000 mg | ORAL_TABLET | Freq: Once | ORAL | Status: AC
  Administered 2015-11-29: 18:00:00 75 mg via ORAL
  Filled 2015-11-29: qty 1

## 2015-11-29 MED ORDER — MUPIROCIN 2 % EX OINT
TOPICAL_OINTMENT | Freq: Once | CUTANEOUS | Status: AC
  Administered 2015-11-29: 1 via NASAL

## 2015-11-29 MED ORDER — LIDOCAINE HCL (PF) 1 % IJ SOLN
INTRAMUSCULAR | Status: AC
  Filled 2015-11-29: qty 60

## 2015-11-29 MED ORDER — ASPIRIN EC 81 MG PO TBEC
81.0000 mg | DELAYED_RELEASE_TABLET | Freq: Every day | ORAL | Status: DC
  Administered 2015-11-29: 81 mg via ORAL
  Filled 2015-11-29: qty 1

## 2015-11-29 MED ORDER — SODIUM CHLORIDE 0.9 % IV SOLN: 250.0000 mL | INTRAVENOUS | Status: DC

## 2015-11-29 MED ORDER — MUPIROCIN 2 % EX OINT
TOPICAL_OINTMENT | CUTANEOUS | Status: AC
  Filled 2015-11-29: qty 22

## 2015-11-29 MED ORDER — NITROGLYCERIN 0.4 MG SL SUBL: 0.4000 mg | SUBLINGUAL_TABLET | SUBLINGUAL | Status: DC | PRN

## 2015-11-29 MED ORDER — IOPAMIDOL (ISOVUE-370) INJECTION 76%
INTRAVENOUS | Status: AC
  Filled 2015-11-29: qty 50

## 2015-11-29 MED ORDER — CEFAZOLIN SODIUM-DEXTROSE 2-3 GM-% IV SOLR: INTRAVENOUS | Status: DC | PRN

## 2015-11-29 MED ORDER — ROSUVASTATIN CALCIUM 20 MG PO TABS
40.0000 mg | ORAL_TABLET | Freq: Every day | ORAL | Status: DC
  Administered 2015-11-29: 40 mg via ORAL
  Filled 2015-11-29 (×2): qty 2

## 2015-11-29 MED ORDER — ACETAMINOPHEN 325 MG PO TABS: 325.0000 mg | ORAL_TABLET | ORAL | Status: DC | PRN

## 2015-11-29 MED ORDER — PANTOPRAZOLE SODIUM 40 MG PO TBEC
40.0000 mg | DELAYED_RELEASE_TABLET | Freq: Every day | ORAL | Status: DC
  Administered 2015-11-29 – 2015-11-30 (×2): 40 mg via ORAL
  Filled 2015-11-29 (×2): qty 1

## 2015-11-29 MED ORDER — LIDOCAINE HCL (PF) 1 % IJ SOLN
INTRAMUSCULAR | Status: DC | PRN
  Administered 2015-11-29: 55 mL via SUBCUTANEOUS

## 2015-11-29 MED ORDER — ONDANSETRON HCL 4 MG/2ML IJ SOLN: 4.0000 mg | Freq: Four times a day (QID) | INTRAMUSCULAR | Status: DC | PRN

## 2015-11-29 MED ORDER — CEFAZOLIN SODIUM-DEXTROSE 2-4 GM/100ML-% IV SOLN
2.0000 g | INTRAVENOUS | Status: AC
  Administered 2015-11-29: 2 g via INTRAVENOUS

## 2015-11-29 SURGICAL SUPPLY — 12 items
CABLE SURGICAL S-101-97-12 (CABLE) ×2 IMPLANT
INGEVITY MRI 7741-52CM (Lead) ×4 IMPLANT
INGEVITY MRI 7742-59CM (Lead) ×4 IMPLANT
LEAD PACING INGEVITY MRI 52CM (Lead) IMPLANT
LEAD PACING INGEVITY MRI 59CM (Lead) IMPLANT
PAD DEFIB LIFELINK (PAD) ×2 IMPLANT
POUCH AIGIS-R ANTIBACT ICD (Mesh General) ×4 IMPLANT
POUCH AIGIS-R ANTIBACT ICD LRG (Mesh General) IMPLANT
SHEATH CLASSIC 7F (SHEATH) ×4 IMPLANT
SYS PACING ACCOLADE EL DR L321 (Pacemaker) ×4 IMPLANT
SYSTEM PACING ACCLD EL DR L321 (Pacemaker) IMPLANT
TRAY PACEMAKER INSERTION (PACKS) ×2 IMPLANT

## 2015-11-29 NOTE — Op Note (Signed)
Procedure report  Procedure performed:  1. Dual-chamber pacemaker generator changeout  2. Light sedation  3. Left upper extremity venogram Reason for procedure:  1. Device generator at elective replacement interval  2. Complete heart block/pacemaker dependent 3. Atrial lead malfunction (oversensing) 4. Ventricular lead malfunction (external insulation failure)  Procedure performed by:  Sanda Klein, MD  Complications:  None  Estimated blood loss:  15 mL  Medications administered during procedure:  Ancef 2 g intravenously,  lidocaine 1% 60 mL locally, fentanyl 75 mcg intravenously, Versed 3 mg intravenously, isovue 20 mL  Total sedation time 90 minutes  Device details:   [place] DR model number [id number], serial number [id number] Right atrial lead (new) [employer] MRI, model number I5318196, serial number [id number] Right ventricular lead (new)  [employer] MRI, model number [id number], serial number [id number]   Explanted generator [employer] 12/08/2002, serial [id number]) implanted 08/04/2008 Chronic (capped) right atrial lead [employer] 430-07, serial [id number] implanted 11-15-1993, (capped) right ventricular lead [employer] model 438-05, serial [id number] implanted 11-15-1993  Procedure details:  After the risks and benefits of the procedure were discussed the patient provided informed consent. He was brought to the cardiac catheter lab in the fasting state. The patient was prepped and draped in usual sterile fashion. Local anesthesia with 1% lidocaine was administered to to the left infraclavicular area. A 5-6cm horizontal incision was made parallel with and 2-3 cm caudal to the left clavicle, cranial to 2 older scars.  Using minimal electrocautery and mostly sharp and blunt dissection the prepectoral pocket was opened carefully to avoid injury to the loops of chronic leads. Extensive dissection was  necessary. It appeared that the device  was located in a subpectoral pocket. The device was explanted. The pocket was carefully inspected for hemostasis and flushed with copious amounts of antibiotic solution.  A left upper extremity venogram had already been performed showing that the vein was widely patent. The chronic atrial lead had problems with "noise" due to oversensing and the plan was to replace it Venous access was obtained with care taken not to injure the chronic leads. A 7 [place] SafeSheath was placed over the wire. An atrial lead was then advanced to the level of the right atrial appendage. The helix was extended and the initial location showed relatively poor sensing and absent injury current. The lead helix was therefore retracted and the lead was repositioned. In the second location there was excellent current of injury and satisfactory sensing and pacing parameters. The sheath was split and the lead was secured in place with 2-0 silk.  It appeared that the right ventricular lead (which was actually a bipolar lead) offered satisfactory function. It was therefore disconnected from the old device and connected quickly to a new generator. However doing the exchange process it was noticed that the external insulation would peel-away from the internal conductor. Thankfully, the lead maintained normal pacing features. It was left connected to the new generator while new ventricular lead placement was performed. Another 7 French sheath was placed over the wire in the left subclavian vein and a new active-fixation right ventricular lead was advanced to the apex and secured in place. There was excellent current of injury and they were very good sensing and pacing parameters. This sheath was split away and the lead was secured in place with 2-0 silk. Subsequently the damaged chronic right ventricular lead was disconnected from the generator and quickly replaced with the new right ventricular lead. Satisfactory  atrial and ventricular sensing and  pacing parameters were recorded via telemetry.  Chronic right atrial and right ventricular leads were capped. Care was taken that the entire distal portion of the right ventricular lead was included under the plastic. There was no exposed electrode.  Since this was the fifth surgical procedure in this location and the patient is pacemaker dependent, there decision was taken to place the device and a antibiotic Tyrx pouch. This had been soaked in saline solution. It was carefully placed in the pocket with the device and leads inside, with care that it assumed a smooth, comfortable position. The leads were located deep to the generator  The pocket was then closed in layers using 2 layers of 2-0 Vicryl and cutaneous staples after which a sterile dressing was applied.   At the end of the procedure the following lead parameters were encountered:   Right atrial lead sensed P waves 2.4 mV, impedance 719 ohms, threshold 0.5 at 0.4 ms pulse width.  Right ventricular lead sensed R waves  none detected mV, impedance 1007 ohms, threshold 0.4 at 0.4 ms pulse width.  Sanda Klein, MD, [facility] [phone] office [phone] pager

## 2015-11-30 ENCOUNTER — Encounter (HOSPITAL_COMMUNITY): Payer: Self-pay | Admitting: Cardiovascular Disease

## 2015-11-30 ENCOUNTER — Ambulatory Visit (HOSPITAL_COMMUNITY): Payer: Medicare Other

## 2015-11-30 DIAGNOSIS — T82110A Breakdown (mechanical) of cardiac electrode, initial encounter: Secondary | ICD-10-CM | POA: Diagnosis not present

## 2015-11-30 DIAGNOSIS — Z45010 Encounter for checking and testing of cardiac pacemaker pulse generator [battery]: Secondary | ICD-10-CM | POA: Diagnosis not present

## 2015-11-30 DIAGNOSIS — I10 Essential (primary) hypertension: Secondary | ICD-10-CM | POA: Diagnosis not present

## 2015-11-30 DIAGNOSIS — E785 Hyperlipidemia, unspecified: Secondary | ICD-10-CM | POA: Diagnosis not present

## 2015-11-30 DIAGNOSIS — I442 Atrioventricular block, complete: Secondary | ICD-10-CM | POA: Diagnosis not present

## 2015-11-30 DIAGNOSIS — J9811 Atelectasis: Secondary | ICD-10-CM | POA: Diagnosis not present

## 2015-11-30 DIAGNOSIS — I2510 Atherosclerotic heart disease of native coronary artery without angina pectoris: Secondary | ICD-10-CM | POA: Diagnosis not present

## 2015-11-30 MED FILL — Gentamicin Sulfate Inj 40 MG/ML: INTRAMUSCULAR | Qty: 2 | Status: AC

## 2015-11-30 MED FILL — Sodium Chloride Irrigation Soln 0.9%: Qty: 500 | Status: AC

## 2015-11-30 MED FILL — Lidocaine HCl Local Preservative Free (PF) Inj 1%: INTRAMUSCULAR | Qty: 30 | Status: AC

## 2015-11-30 NOTE — Discharge Instructions (Signed)
° ° °  Supplemental Discharge Instructions for  Pacemaker/Defibrillator Patients  Activity No heavy lifting or vigorous activity with your left/right arm for 6 to 8 weeks.  Do not raise your left/right arm above your head for one week.  Gradually raise your affected arm as drawn below.           __  NO DRIVING for 1 week;  WOUND CARE - Keep the wound area clean and dry.  Do not get this area wet for one week. No showers for one week; you may shower on     . - The tape/steri-strips on your wound will fall off; do not pull them off.  No bandage is needed on the site.  DO  NOT apply any creams, oils, or ointments to the wound area. - If you notice any drainage or discharge from the wound, any swelling or bruising at the site, or you develop a fever > 101? F after you are discharged home, call the office at once.  Special Instructions - You are still able to use cellular telephones; use the ear opposite the side where you have your pacemaker/defibrillator.  Avoid carrying your cellular phone near your device. - When traveling through airports, show security personnel your identification card to avoid being screened in the metal detectors.  Ask the security personnel to use the hand wand. - Avoid arc welding equipment, MRI testing (magnetic resonance imaging), TENS units (transcutaneous nerve stimulators).  Call the office for questions about other devices. - Avoid electrical appliances that are in poor condition or are not properly grounded. - Microwave ovens are safe to be near or to operate.  Additional information for defibrillator patients should your device go off: - If your device goes off ONCE and you feel fine afterward, notify the device clinic nurses. - If your device goes off ONCE and you do not feel well afterward, call 911. - If your device goes off TWICE, call 911. - If your device goes off THREE times in one day, call 911.  DO NOT DRIVE YOURSELF OR A FAMILY MEMBER WITH A  DEFIBRILLATOR TO THE HOSPITAL--CALL 911.

## 2015-11-30 NOTE — Discharge Summary (Signed)
Discharge Summary    Patient ID: David [name],  MRN: [id number], DOB/AGE: [birth year] 82 y.o.  Admit date: 11/29/2015 Discharge date: 11/30/2015  Primary Care Provider: [facility] Primary Cardiologist: Dr. [name]  Discharge Diagnoses    Principal Problem:   Complete heart block- PTVDP '95 with Gen change '02 and '09 (BS). Pacer dependednt Active Problems:   Pacemaker battery depletion   Pacemaker lead malfunction   CHB (complete heart block) (HCC)   Allergies Allergies  Allergen Reactions  . Azithromycin Diarrhea  . Lactose Intolerance (Gi) Diarrhea  . Niacin And Related Diarrhea  . Sulfa Antibiotics Rash    Diagnostic Studies/Procedures   1) Dual Chamber pacemaker generator change out with implantation of 2 new bipolar leads 11/29/15 (See OP Note for full details)  2) Post op CXR 11/30/15 EXAM: CHEST 2 VIEW  COMPARISON: 03/01/2015.  FINDINGS: Pacemaker placement with lead tips in right atrium and ventricle. Prior CABG. Heart size normal. Mild bibasilar subsegmental atelectasis. Small right pleural effusion cannot be excluded. No pneumothorax.  IMPRESSION: 1. Interim placement of pacemaker, lead tips noted over the right atrium and right ventricle. Prior CABG. Heart size normal. No pneumothorax.  2. Mild bibasilar subsegmental atelectasis. Small right pleural effusion cannot be excluded.   History of Present Illness     82 y/o male, followed by Dr. [name], with h/o pacemaker at [facility] (pacemaker dependent due to complete heart block), also has coronary artery disease and symptomatic PVCs (and one episode of lengthy nonsustained VT). Also with evidence of atrial led malfunction with oversensing and relatively high pacing threshold. Generator change out with implantation of 2 new bipolar leads was recommended. He presented to [facility] on 11/29/15 for the planned procedure.    Hospital Course     The procedure was performed by Dr. [name]. He underwent  successful insertion of a   1) Right atrial lead (new) [place] MRI, model number [id number], serial number [id number] 2) Right ventricular lead (new) [employer] MRI, model number [id number], serial number [id number]  3) [address] [place] [address] [employer] [place] EL DR model number [id number], serial number [id number]  He tolerated the procedure well. There were no immediate complications. He was monitored overnight and had no post op complications. He denied any CP or dyspnea. Post-op CXR showed proper lead placement and no evidence of pneumothorax. His device pocket/ incision site remained stable. Device interrogation revealed normal functioning. Vital signs remained stable. He was seen and examined by Dr. [name] on post op day 1 and was felt to be stable for discharge home. He was given proper wound care instructions and activity restrictions. 7-10 day wound check has been arranged at our [facility]. F/u has been arranged with Dr. [name] on 12/28/15.    Consultants: none   Discharge Vitals Blood pressure 149/75, pulse 77, temperature 98 F (36.7 C), temperature source Oral, resp. rate 25, weight 212 lb 4.9 oz (96.3 kg), SpO2 93 %.  Filed Weights   11/30/15 0618  Weight: 212 lb 4.9 oz (96.3 kg)    Labs & Radiologic Studies    CBC  Recent Labs  11/28/15 1206  WBC 4.9  HGB 14.0  HCT 40.7  MCV 91.5  PLT [id number]   Basic Metabolic Panel  Recent Labs  11/28/15 1206  NA 140  K 4.5  CL 104  CO2 27  GLUCOSE 99  BUN 17  CREATININE 1.09  CALCIUM 9.1   Liver Function Tests No results for input(s): AST, ALT,  ALKPHOS, BILITOT, PROT, ALBUMIN in the last 72 hours. No results for input(s): LIPASE, AMYLASE in the last 72 hours. Cardiac Enzymes No results for input(s): CKTOTAL, CKMB, CKMBINDEX, TROPONINI in the last 72 hours. BNP Invalid input(s): POCBNP D-Dimer No results for input(s): DDIMER in the last 72 hours. Hemoglobin A1C No results for input(s): HGBA1C  in the last 72 hours. Fasting Lipid Panel No results for input(s): CHOL, HDL, LDLCALC, TRIG, CHOLHDL, LDLDIRECT in the last 72 hours. Thyroid Function Tests  Recent Labs  11/28/15 1206  TSH 3.55   _____________  Dg Chest 2 View  11/30/2015  CLINICAL DATA:  Pacemaker placement. EXAM: CHEST  2 VIEW COMPARISON:  03/01/2015. FINDINGS: Pacemaker placement with lead tips in right atrium and ventricle. Prior CABG. Heart size normal. Mild bibasilar subsegmental atelectasis. Small right pleural effusion cannot be excluded. No pneumothorax. IMPRESSION: 1. Interim placement of pacemaker, lead tips noted over the right atrium and right ventricle. Prior CABG. Heart size normal. No pneumothorax. 2. Mild bibasilar subsegmental atelectasis. Small right pleural effusion cannot be excluded. Electronically Signed   By: Marcello Moores  Register   On: 11/30/2015 08:11   Disposition   Pt is being discharged home today in good condition.  Follow-up Plans & Appointments    Follow-up Information    Follow up with [facility] On 12/08/2015.   Specialty:  Cardiology   Why:  wound check @ 10:30 AM   Contact information:   [address], [place]      Follow up with Sanda Klein, MD On 12/28/2015.   Specialty:  Cardiology   Why:  9:45 AM    Contact information:   [address] [address] [place] [address] [phone]      Discharge Instructions    Diet - low sodium heart healthy    Complete by:  As directed      Increase activity slowly    Complete by:  As directed            Discharge Medications   Discharge Medication List as of 11/30/2015  9:39 AM    CONTINUE these medications which have NOT CHANGED   Details  acetaminophen (TYLENOL) 325 MG tablet Take 2 tablets (650 mg total) by mouth every 4 (four) hours as needed for pain or fever., Starting 03/05/2013, Until Discontinued, OTC    aspirin EC 81 MG tablet Take 81 mg  by mouth at bedtime., Until Discontinued, Historical Med    bimatoprost (LUMIGAN) 0.01 % SOLN Place 1 drop into both eyes at bedtime. , Until Discontinued, Historical Med    cetirizine (ZYRTEC) 10 MG tablet Take 10 mg by mouth daily., Until Discontinued, Historical Med    cilostazol (PLETAL) 100 MG tablet Take 1 tablet by mouth two  times daily, Normal    clopidogrel (PLAVIX) 75 MG tablet Take 1 tablet by mouth  daily, Normal    Glucosamine-Chondroitin 750-600 MG TABS Take 1 tablet by mouth 2 (two) times daily., Until Discontinued, Historical Med    lisinopril (PRINIVIL,ZESTRIL) 20 MG tablet Take 20 mg by mouth daily., Until Discontinued, Historical Med    metoprolol (LOPRESSOR) 50 MG tablet Take 1 and 1/2 tablets by  mouth two times daily, Normal    Multiple Vitamins-Minerals (SENIOR MULTIVITAMIN PLUS PO) Take 1 tablet by mouth daily., Until Discontinued, Historical Med    nitroGLYCERIN (NITROSTAT) 0.4 MG SL tablet Place 1 tablet (0.4 mg total) under the tongue every 5 (five) minutes as  needed for chest pain., Starting 03/05/2013, Until Discontinued, Normal    omega-3 acid ethyl esters (LOVAZA) 1 g capsule Take 2 capsules by mouth  two times daily, Normal    pantoprazole (PROTONIX) 40 MG tablet Take 1 tablet by mouth  daily, Normal    rosuvastatin (CRESTOR) 40 MG tablet Take 1 tablet by mouth at  bedtime, Normal           Outstanding Labs/Studies   7-10 day wound check   Duration of Discharge Encounter   Greater than 30 minutes including physician time.  Signed, Yaira Bernardi PA-C 11/30/2015, 3:13 PM

## 2015-11-30 NOTE — Progress Notes (Signed)
D/C instructions reviewed with pt and wife and daughter, questions answered.  LUC drsg has small amt old blood, pt states he has been instructed by Dr. [name] to remove drsg tomorrow.  Denies complaints, states LUC doesn't hurt unless he touches it.  IV/Tele d/c'd.  To front lobby per w/c with family.

## 2015-12-08 ENCOUNTER — Ambulatory Visit (INDEPENDENT_AMBULATORY_CARE_PROVIDER_SITE_OTHER): Payer: Medicare Other | Admitting: *Deleted

## 2015-12-08 DIAGNOSIS — Z950 Presence of cardiac pacemaker: Secondary | ICD-10-CM

## 2015-12-08 LAB — CUP PACEART INCLINIC DEVICE CHECK
Brady Statistic RA Percent Paced: 52 %
Implantable Lead Implant Date: 20170425
Implantable Lead Implant Date: 20170425
Implantable Lead Location: 753859
Implantable Lead Model: 7742
Implantable Lead Serial Number: 720495
Lead Channel Pacing Threshold Amplitude: 0.8 V
Lead Channel Pacing Threshold Pulse Width: 0.4 ms
Lead Channel Sensing Intrinsic Amplitude: 2.8 mV
Lead Channel Setting Pacing Amplitude: 1.2 V
Lead Channel Setting Pacing Pulse Width: 0.4 ms
Lead Channel Setting Sensing Sensitivity: 2.5 mV
MDC IDC LEAD LOCATION: 753860
MDC IDC LEAD SERIAL: 763888
MDC IDC MSMT LEADCHNL RA IMPEDANCE VALUE: 587 Ohm
MDC IDC MSMT LEADCHNL RV IMPEDANCE VALUE: 880 Ohm
MDC IDC MSMT LEADCHNL RV PACING THRESHOLD AMPLITUDE: 0.7 V
MDC IDC MSMT LEADCHNL RV PACING THRESHOLD PULSEWIDTH: 0.4 ms
MDC IDC SESS DTM: 20170504040000
MDC IDC SET LEADCHNL RA PACING AMPLITUDE: 3.5 V
MDC IDC STAT BRADY RV PERCENT PACED: 99 %
Pulse Gen Serial Number: 718025

## 2015-12-08 NOTE — Progress Notes (Signed)
Wound check appointment. Staples removed. Soft hematoma noted, pt denies increase in size since implant. Incision edges approximated, wound well healed. Normal device function. Thresholds, sensing, and impedances consistent with implant measurements. Device programmed at 3.5V/auto capture programmed on for extra safety margin until 3 month visit. Histogram distribution appropriate for patient and level of activity. 1 ATR 8 sec of AT. No high ventricular rates noted. Patient educated about wound care, to call if swelling worsens, arm mobility, lifting restrictions. ROV 12/28/15 with [facility].

## 2015-12-28 ENCOUNTER — Other Ambulatory Visit: Payer: Self-pay | Admitting: Cardiovascular Disease

## 2015-12-28 ENCOUNTER — Encounter: Payer: Self-pay | Admitting: Cardiovascular Disease

## 2015-12-28 ENCOUNTER — Ambulatory Visit (INDEPENDENT_AMBULATORY_CARE_PROVIDER_SITE_OTHER): Payer: Medicare Other | Admitting: Cardiovascular Disease

## 2015-12-28 VITALS — BP 109/68 | HR 63 | Ht 69.75 in | Wt 209.6 lb

## 2015-12-28 DIAGNOSIS — I6521 Occlusion and stenosis of right carotid artery: Secondary | ICD-10-CM

## 2015-12-28 DIAGNOSIS — I442 Atrioventricular block, complete: Secondary | ICD-10-CM | POA: Diagnosis not present

## 2015-12-28 DIAGNOSIS — I10 Essential (primary) hypertension: Secondary | ICD-10-CM

## 2015-12-28 DIAGNOSIS — I472 Ventricular tachycardia: Secondary | ICD-10-CM

## 2015-12-28 DIAGNOSIS — Z950 Presence of cardiac pacemaker: Secondary | ICD-10-CM

## 2015-12-28 DIAGNOSIS — E785 Hyperlipidemia, unspecified: Secondary | ICD-10-CM

## 2015-12-28 DIAGNOSIS — Z8572 Personal history of non-Hodgkin lymphomas: Secondary | ICD-10-CM

## 2015-12-28 DIAGNOSIS — I739 Peripheral vascular disease, unspecified: Secondary | ICD-10-CM

## 2015-12-28 DIAGNOSIS — I255 Ischemic cardiomyopathy: Secondary | ICD-10-CM | POA: Diagnosis not present

## 2015-12-28 DIAGNOSIS — I25718 Atherosclerosis of autologous vein coronary artery bypass graft(s) with other forms of angina pectoris: Secondary | ICD-10-CM

## 2015-12-28 DIAGNOSIS — I4729 Other ventricular tachycardia: Secondary | ICD-10-CM

## 2015-12-28 DIAGNOSIS — C8590 Non-Hodgkin lymphoma, unspecified, unspecified site: Secondary | ICD-10-CM

## 2015-12-28 NOTE — Progress Notes (Signed)
Patient ID: David [name], male   DOB: [birth year], 82 y.o.   MRN: [id number] Patient ID: David [name], male   DOB: 23-May-1933, 82 y.o.   MRN: [id number]    Cardiology Office Note    Date:  12/29/2015   ID:  GARAN COHEN, DOB 1933/03/04, MRN [id number]  PCP:  Mayra Neer, MD  Cardiologist:  Quay Burow, MD (PAD);  Sanda Klein, MD   Chief Complaint  Patient presents with  . Follow-up    4-6 week  pt states no Sx.    History of Present Illness:  David [name] is a 82 y.o. male David [name] is a 83 y.o. male who presents for follow-up on his pacemaker, Roughly one month following implantation of a new generator and new atrial and ventricular leads . He is pacemaker dependent due to complete heart block. His old atrial lead had problems with "noise" and his old ventricular lead was found to have disruption of the insulation at the time of generator change out. He also has coronary artery disease and symptomatic PVCs (and one episode of lengthy nonsustained VT).  Denies angina or dyspnea on exertion, intermittent claudication, leg edema, focal neurological deficits, palpitations, syncope or any other cardiovascular complaints. He is planning cataract surgery. His pacemaker is located subpectorally, and he has had substantial ecchymosis at the surgical site, but it is healing nicely now.  Interrogation of his pacemaker shows normal device function. His current device is a  [name] number [id number], serial number [id number] Right atrial lead (new) [employer], model number [id number], serial number [id number] Right ventricular lead (new) [place] [employer], model number [id number], serial number [id number]   Office check today shows sensed P waves 2.1 mV, atrial impedance 581 ohms, atrial threshold 0.8 V at 0.4 ms. There are no sensed R waves due to complete heart block. Ventricular lead impedance is 825 ohms, ventricular threshold 0.8 V at 0.4 ms pulse  width. He has 61% atrial pacing and 98% ventricular pacing.  David [name] has had numerous coronary revascularization procedures, most recently a stent in the saphenous vein graft to the oblique marginal artery in July 2014. He is now almost 20 years status post coronary bypass surgery. The same bypass graft had received a stent in 2007. He has mild ischemic cardiomyopathy with an ejection fraction of 40-45%, without clinical  heart failure. The nuclear stress test on July 28, 2015 shows old scar in the apex and inferior wall. LVEF is calculated at 43% (identical to 2014 and 2015).  He had occlusion of the left popliteal artery with distal reconstitution via collaterals. He had successful recanalization with placement of 2 stents by Dr. Andree Elk in 2015 . This led to marked improvement in his claudication He is pacemaker dependent secondary to complete heart block and his dual chamber Actor (gen change 2009) is likely to reach ERI in the next 18 months. Note that both his atrial and ventricular leads were placed in 1995 and are unipolar. (Atrial model 438-05, ventricular model 430-07). Interrogation of his device today again shows some issues with "noise" on the atrial channel but none on the ventricular channel. Overall device function is normal and he has virtually 100% atrial pacing as well as 100% ventricular pacing.  Moderate carotid artery disease (stable 60-79% proximal internal carotid artery stenosis on the right, mild plaque on the left), last duplex US was February 11, 2015.  He has a history of  non-Hodgkin's lymphoma with a large neck mass but also mediastinal and abdominal lymphadenopathy treated with radiotherapy to the cervical area in 2011 followed by chemotherapy with Rituxan and fludarabine. He is considered to be in remission.     Past Medical History  Diagnosis Date  . CAD (coronary artery disease)   . Hypertension   . Peripheral vascular disease with claudication  ([place])   . Hyperlipidemia   . Left ventricular dysfunction   . Complete heart block ([place])   . Pacemaker   . Anginal pain ([place])   . Myocardial infarction ([place]) 1989  . Carotid artery disease ([place])   . PAD (peripheral artery disease) ([place])   . Arthritis     "hands" (10/19/2013)  . Non Hodgkin's lymphoma ([place]) 06/2010    "excised from left neck then tx'd w/chemo"  . NSVT (nonsustained ventricular tachycardia) ([place]) 06/24/2014    Max rate 200 bpm, 28-beat, nearly 9-second episode recorded by pacemaker on November 15, asymptomatic   . Non Hodgkin's lymphoma [facility])     Past Surgical History  Procedure Laterality Date  . Anal fissure repair  1990's  . 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 Superficial lymph node biopsy / excision Left 2011    "neck; turned out to be non Hodgkins lymphoma"  . Popliteal artery stent Right 10/19/2013  . Cardiac catheterization  1989  . Coronary angioplasty with stent placement  2007; 03/04/2013    ""2 +1"  . Coronary artery bypass graft  09/10/1988    "CABG X5" (03/04/2013) LIMA to LAD,SVG to imtermediate OM1 OM II,SVG to PDA  . Left heart catheterization with coronary/graft angiogram N/A 03/04/2013    Procedure: LEFT HEART CATHETERIZATION WITH Beatrix Fetters;  Surgeon: Troy Sine, MD;  Location[facility] C[facility] LAB;  Service: Cardiovascular;  Laterality: N/A;  . Percutaneous coronary stent intervention (pci-s)  03/04/2013    Procedure: PERCUTANEOUS CORONARY STENT INTERVENTION (PCI-S);  Surgeon: Troy Sine, MD;  Location: [facility] CATH LAB;  Service: Cardiovascular;;  . Lower extremity angiogram N/A 08/31/2013    Procedure: LOWER EXTREMITY ANGIOGRAM;  Surgeon: Lorretta Harp, MD;  Location: [facility] CATH LAB;  Service: Cardiovascular;  Laterality: N/A;  . Insert / replace / remove pacemaker  1995; 2002; 2009    [employer]  . Pacemaker revision  11/29/2015  . Ep implantable device N/A 11/29/2015    Procedure: Pacemaker Implant;  Surgeon:  Sanda Klein, MD;  Location: [place] CV LAB;  Service: Cardiovascular;  Laterality: N/A;  . Venogram Left 11/29/2015    Procedure: Venogram;  Surgeon: Sanda Klein, MD;  Location: [place] CV LAB;  Service: Cardiovascular;  Laterality: Left;    Outpatient Prescriptions Prior to Visit  Medication Sig Dispense Refill  . acetaminophen (TYLENOL) 325 MG tablet Take 2 tablets (650 mg total) by mouth every 4 (four) hours as needed for pain or fever.    Marland Kitchen aspirin EC 81 MG tablet Take 81 mg by mouth at bedtime.    . bimatoprost (LUMIGAN) 0.01 % SOLN Place 1 drop into both eyes at bedtime.     . cetirizine (ZYRTEC) 10 MG tablet Take 10 mg by mouth daily.    . cilostazol (PLETAL) 100 MG tablet Take 1 tablet by mouth two  times daily 180 tablet 2  . Glucosamine-Chondroitin 750-600 MG TABS Take 1 tablet by mouth 2 (two) times daily.    Marland Kitchen lisinopril (PRINIVIL,ZESTRIL) 20 MG tablet Take 20 mg by mouth daily.    Marland Kitchen  Multiple Vitamins-Minerals (SENIOR MULTIVITAMIN PLUS PO) Take 1 tablet by mouth daily.    . nitroGLYCERIN (NITROSTAT) 0.4 MG SL tablet Place 1 tablet (0.4 mg total) under the tongue every 5 (five) minutes as needed for chest pain. 25 tablet 2  . omega-3 acid ethyl esters (LOVAZA) 1 g capsule Take 2 capsules by mouth  two times daily 360 capsule 0  . pantoprazole (PROTONIX) 40 MG tablet Take 1 tablet by mouth  daily 90 tablet 0  . rosuvastatin (CRESTOR) 40 MG tablet Take 1 tablet by mouth at  bedtime 90 tablet 1  . clopidogrel (PLAVIX) 75 MG tablet Take 1 tablet by mouth  daily 90 tablet 0  . metoprolol (LOPRESSOR) 50 MG tablet Take 1 and 1/2 tablets by  mouth two times daily 270 tablet 1   No facility-administered medications prior to visit.     Allergies:   Azithromycin; Lactose intolerance (gi); Niacin and related; and Sulfa antibiotics   Social History   Social History  . Marital Status: Married    Spouse Name: N/A  . Number of Children: N/A  . Years of Education: N/A    Social History Main Topics  . Smoking status: Former Smoker -- 0.12 packs/day for 1 years    Types: Cigarettes    Quit date: 08/06/1956  . Smokeless tobacco: Never Used  . Alcohol Use: 1.2 oz/week    2 Glasses of wine per week  . Drug Use: No  . Sexual Activity: No   Other Topics Concern  . None   Social History Narrative     Family History:  The patient's family history includes Diabetes in his mother; Heart attack in his father; Hyperlipidemia in his sister and sister; Hypertension in his sister and sister; Stroke in his mother.   ROS:   Please see the history of present illness.    ROS All other systems reviewed and are negative.   PHYSICAL EXAM:   VS:  BP 109/68 mmHg  Pulse 63  Ht 5' 9.75" (1.772 m)  Wt 95.074 kg (209 lb 9.6 oz)  BMI 30.28 kg/m2   GEN: Well nourished, well developed, in no acute distress HEENT: normal Neck: no JVD, carotid bruits, or masses Cardiac: RRR; no murmurs, rubs, or gallops,no edema  Respiratory:  clear to auscultation bilaterally, normal work of breathing GI: soft, nontender, nondistended, + BS MS: no deformity or atrophy Skin: warm and dry, no rash Neuro:  Alert and Oriented x 3, Strength and sensation are intact Psych: euthymic mood, full affect  Wt Readings from Last 3 Encounters:  12/28/15 95.074 kg (209 lb 9.6 oz)  11/30/15 96.3 kg (212 lb 4.9 oz)  10/24/15 95.981 kg (211 lb 9.6 oz)      Studies/Labs Reviewed:   EKG:  EKG is not ordered today.    Recent Labs: 11/28/2015: BUN 17; Creat 1.09; Hemoglobin 14.0; Platelets 147; Potassium 4.5; Sodium 140; TSH 3.55   Lipid Panel    Component Value Date/Time   CHOL 119 06/24/2014 0855   TRIG 185* 06/24/2014 0855   HDL 35* 06/24/2014 0855   CHOLHDL 3.4 06/24/2014 0855   VLDL 37 06/24/2014 0855   LDLCALC 47 06/24/2014 0855   ASSESSMENT:    1. Complete heart block ([place])   2. Pacemaker   3. Coronary artery disease involving autologous vein coronary bypass graft with other  forms of angina pectoris ([place])   4. Cardiomyopathy, ischemic- EF 40-45% by echo 4/14   5. Essential hypertension   6. NSVT (  nonsustained ventricular tachycardia) ([facility])   7. Carotid artery stenosis, right   8. PAD (peripheral artery disease) ([place])   9. Hyperlipidemia   10. Lymphoma in remission ([place])      PLAN:  In order of problems listed above:  1. Complete heart block: He is pacemaker dependent secondary to complete heart block. 2. PM: Normal device function. Remote download every 3 months and yearly office visit 3. CAD s/p CABG 1995, s/p stent SVG-OM 2007 and July 2014 Angina free, very recent nuclear stress test without reversible ischemia. He should be outside the typical time range for in-stent restenosis at this point. If he is to undergo surgery, I do not think there would be any problem with temporarily discontinuing clopidogrel. 4. Cardiomyopathy, ischemic - EF 43%  No clinical evidence of congestive heart failure. On appropriate therapy with beta blockers and ace inhibitors and on high-dose statin, aspirin, clopidogrel. No indication for CRT at this point. 5. HTN: well controlled 6. NSVT (nonsustained ventricular tachycardia): no evidence of recurrent NSVT on device check today 7. Asymptomatic stenosis of right carotid artery without infarction Stable moderate stenosis by repeat carotid duplex ultrasound. Will recheck at one year intervals. 8. PAD - Lt popliteal occlusion with collaterals; rt popliteal successful stent 2015. Continues to have sustained benefit with reduction in claudication after his popliteal artery intervention. 9. HLP: Most recent lipid profile was excellent 10. Lymphoma in remission  Medication Adjustments/Labs and Tests Ordered: Current medicines are reviewed at length with the patient today.  Concerns regarding medicines are outlined above.  Medication changes, Labs and Tests ordered today are listed in the Patient Instructions below. Patient  Instructions  Your physician recommends that you schedule a follow-up appointment in: 3 months pacemaker check and 12 months with Dr. [name].        Signed, Sanda Klein, MD  12/29/2015 6:49 PM    [address], [place], [place]  [address] Phone: [phone]; Fax: [phone]

## 2015-12-28 NOTE — Patient Instructions (Signed)
Your physician recommends that you schedule a follow-up appointment in: 3 months pacemaker check and 12 months with Dr. [name].

## 2016-01-06 ENCOUNTER — Encounter: Payer: Self-pay | Admitting: Cardiovascular Disease

## 2016-01-14 ENCOUNTER — Other Ambulatory Visit: Payer: Self-pay | Admitting: Cardiovascular Disease

## 2016-01-16 NOTE — Telephone Encounter (Signed)
Rx request sent to pharmacy.  

## 2016-01-26 LAB — CUP PACEART INCLINIC DEVICE CHECK
Implantable Lead Implant Date: 20170425
Implantable Lead Location: 753859
Implantable Lead Model: 7741
Implantable Lead Model: 7742
Implantable Lead Serial Number: 763888
Lead Channel Setting Pacing Amplitude: 1.2 V
Lead Channel Setting Pacing Pulse Width: 0.4 ms
MDC IDC LEAD IMPLANT DT: 20170425
MDC IDC LEAD LOCATION: 753860
MDC IDC LEAD SERIAL: 720495
MDC IDC SESS DTM: 20170622132018
MDC IDC SET LEADCHNL RA PACING AMPLITUDE: 3.5 V
MDC IDC SET LEADCHNL RV SENSING SENSITIVITY: 2.5 mV
Pulse Gen Serial Number: 718025

## 2016-02-29 DIAGNOSIS — R319 Hematuria, unspecified: Secondary | ICD-10-CM | POA: Diagnosis not present

## 2016-02-29 DIAGNOSIS — N419 Inflammatory disease of prostate, unspecified: Secondary | ICD-10-CM | POA: Diagnosis not present

## 2016-03-04 ENCOUNTER — Other Ambulatory Visit: Payer: Self-pay | Admitting: Cardiovascular Disease

## 2016-03-06 ENCOUNTER — Other Ambulatory Visit: Payer: Self-pay | Admitting: Cardiovascular Disease

## 2016-03-14 DIAGNOSIS — R310 Gross hematuria: Secondary | ICD-10-CM | POA: Diagnosis not present

## 2016-03-14 DIAGNOSIS — R350 Frequency of micturition: Secondary | ICD-10-CM | POA: Diagnosis not present

## 2016-03-15 DIAGNOSIS — N200 Calculus of kidney: Secondary | ICD-10-CM | POA: Diagnosis not present

## 2016-03-15 DIAGNOSIS — R310 Gross hematuria: Secondary | ICD-10-CM | POA: Diagnosis not present

## 2016-03-16 DIAGNOSIS — R310 Gross hematuria: Secondary | ICD-10-CM | POA: Diagnosis not present

## 2016-03-27 ENCOUNTER — Telehealth: Payer: Self-pay | Admitting: Cardiovascular Disease

## 2016-03-27 NOTE — Telephone Encounter (Signed)
New Message  Pt voiced he's bleeding by bladder caused by cancer.   Pt voiced he needs to know if he needs to stop taking medication prior to surgery.  Please follow up with pt. Thanks!

## 2016-03-27 NOTE — Telephone Encounter (Signed)
2 weeks ago patient noted blood in urine after seeing PCP he was referred to Dr Wendy Poet w [facility] who informed him after testing that he has cancer in bladder  Blood test was "normal" when taken initially, and he has not had any drop in Hgb. He will see Dr. Tammi Klippel in approx 3 weeks for a surgical consult. Meanwhile, patient c/o pink tinged urine every day, blood clots passing 1-2 times daily. This is very disconcerting for him, and he wonders whether OK to hold plavix/ASA or other instructions as advised by Dr. [name].  I expressed my concern for patient regarding his recent diagnosis, and asked him to let me have Dr. [name] review.  Meds confirmed as listed. He is aware Dr. [name] may provide instruction to go ahead and discontinue, or give recommendation for med changes/holds in context of upcoming procedures.  Patient asked that we communicate recommendations to patient directly and to Dr. McDiarmid's office.  Will route to provider for review.

## 2016-03-28 NOTE — Telephone Encounter (Signed)
Spoke to patient. I communicated Dr. Victorino December advice.   I gave him our best wishes going forward with his treatment. I have informed patient he is more than welcome to call again if new concerns or questions.  Patient voiced understanding and thanks for recommendations.

## 2016-03-28 NOTE — Telephone Encounter (Signed)
I would recommend that he stop the Plavix. It has been several years since his last revascularization procedure and at this point the bleeding appears to outweigh the benefit. I would also recommend that he hold the aspirin for a few days until the hematuria stops, but it will be preferable that he restarts it once his urine clears.

## 2016-04-02 ENCOUNTER — Ambulatory Visit (INDEPENDENT_AMBULATORY_CARE_PROVIDER_SITE_OTHER): Payer: Medicare Other | Admitting: *Deleted

## 2016-04-02 DIAGNOSIS — I442 Atrioventricular block, complete: Secondary | ICD-10-CM | POA: Diagnosis not present

## 2016-04-02 DIAGNOSIS — Z950 Presence of cardiac pacemaker: Secondary | ICD-10-CM

## 2016-04-03 NOTE — Progress Notes (Signed)
Remote pacemaker transmission.   

## 2016-04-05 ENCOUNTER — Other Ambulatory Visit: Payer: Self-pay | Admitting: Urology

## 2016-04-05 ENCOUNTER — Encounter: Payer: Self-pay | Admitting: Cardiology

## 2016-04-05 ENCOUNTER — Telehealth: Payer: Self-pay | Admitting: Cardiovascular Disease

## 2016-04-05 ENCOUNTER — Encounter: Payer: Self-pay | Admitting: Cardiovascular Disease

## 2016-04-05 DIAGNOSIS — C672 Malignant neoplasm of lateral wall of bladder: Secondary | ICD-10-CM | POA: Diagnosis not present

## 2016-04-05 DIAGNOSIS — R310 Gross hematuria: Secondary | ICD-10-CM | POA: Diagnosis not present

## 2016-04-05 NOTE — Telephone Encounter (Signed)
Pt made aware ok to hold ASA 5 days prior to procedure per MD Croitoru.  Also aware MD sent letter to surgeon as well.  Pt verbalized understanding.

## 2016-04-05 NOTE — Telephone Encounter (Signed)
Sent the [employer]

## 2016-04-05 NOTE — Telephone Encounter (Signed)
New Message  Pt voiced surgeon wants pt to stop taking Aspirin and needs MD Croitoru's approval.  Please follow up with pt. Thanks!

## 2016-04-05 NOTE — Telephone Encounter (Signed)
Returned patient call-pt states surgeon would like to stop his ASA prior to procedure he is having and needs MD Croitoru approval .  Pt unsure when procedure is at this time but was told as soon as they could get him in.  After chart review procedure on 9/8? TURBT by MD Manny.  Advised I would route to MD Croitoru for approval.

## 2016-04-11 ENCOUNTER — Encounter (HOSPITAL_BASED_OUTPATIENT_CLINIC_OR_DEPARTMENT_OTHER): Payer: Self-pay | Admitting: *Deleted

## 2016-04-11 NOTE — Progress Notes (Signed)
NPO AFTER MN.  ARRIVE AT NO:[id number].  NEEDS ISTAT.  CURRENT EKG IN CHART AND EPIC.  WILL TAKE METOPROLOL AM DOS W/ SIPS OF WATER.

## 2016-04-12 DIAGNOSIS — H52203 Unspecified astigmatism, bilateral: Secondary | ICD-10-CM | POA: Diagnosis not present

## 2016-04-12 DIAGNOSIS — H25813 Combined forms of age-related cataract, bilateral: Secondary | ICD-10-CM | POA: Diagnosis not present

## 2016-04-12 DIAGNOSIS — D2311 Other benign neoplasm of skin of right eyelid, including canthus: Secondary | ICD-10-CM | POA: Diagnosis not present

## 2016-04-12 LAB — CUP PACEART REMOTE DEVICE CHECK
Battery Remaining Percentage: 100 %
Brady Statistic RV Percent Paced: 97 %
Implantable Lead Implant Date: 20170425
Implantable Lead Location: 753859
Implantable Lead Model: 7742
Implantable Lead Serial Number: 720495
Lead Channel Impedance Value: 854 Ohm
Lead Channel Pacing Threshold Amplitude: 0.7 V
Lead Channel Setting Pacing Amplitude: 1.2 V
Lead Channel Setting Pacing Pulse Width: 0.4 ms
MDC IDC LEAD IMPLANT DT: 20170425
MDC IDC LEAD LOCATION: 753860
MDC IDC LEAD SERIAL: 763888
MDC IDC MSMT BATTERY REMAINING LONGEVITY: 144 mo
MDC IDC MSMT LEADCHNL RA IMPEDANCE VALUE: 646 Ohm
MDC IDC MSMT LEADCHNL RV PACING THRESHOLD PULSEWIDTH: 0.4 ms
MDC IDC PG SERIAL: 718025
MDC IDC SESS DTM: 20170828062000
MDC IDC SET LEADCHNL RA PACING AMPLITUDE: 3.5 V
MDC IDC SET LEADCHNL RV SENSING SENSITIVITY: 2.5 mV
MDC IDC STAT BRADY RA PERCENT PACED: 61 %

## 2016-04-13 ENCOUNTER — Encounter (HOSPITAL_BASED_OUTPATIENT_CLINIC_OR_DEPARTMENT_OTHER)
Admission: RE | Disposition: A | Discharge status: Home / Self Care | Payer: Self-pay | Source: Ambulatory Visit | Attending: Urology

## 2016-04-13 ENCOUNTER — Ambulatory Visit (HOSPITAL_BASED_OUTPATIENT_CLINIC_OR_DEPARTMENT_OTHER): Payer: Medicare Other | Admitting: Anesthesiology

## 2016-04-13 ENCOUNTER — Encounter (HOSPITAL_BASED_OUTPATIENT_CLINIC_OR_DEPARTMENT_OTHER): Payer: Self-pay

## 2016-04-13 ENCOUNTER — Ambulatory Visit (HOSPITAL_BASED_OUTPATIENT_CLINIC_OR_DEPARTMENT_OTHER)
Admission: RE | Admit: 2016-04-13 | Discharge: 2016-04-13 | Disposition: A | Discharge status: Home / Self Care | Payer: Medicare Other | Source: Ambulatory Visit | Attending: Urology | Admitting: Urology

## 2016-04-13 DIAGNOSIS — Z87891 Personal history of nicotine dependence: Secondary | ICD-10-CM | POA: Insufficient documentation

## 2016-04-13 DIAGNOSIS — I10 Essential (primary) hypertension: Secondary | ICD-10-CM | POA: Diagnosis not present

## 2016-04-13 DIAGNOSIS — N3289 Other specified disorders of bladder: Secondary | ICD-10-CM | POA: Diagnosis not present

## 2016-04-13 DIAGNOSIS — I255 Ischemic cardiomyopathy: Secondary | ICD-10-CM | POA: Diagnosis not present

## 2016-04-13 DIAGNOSIS — I2510 Atherosclerotic heart disease of native coronary artery without angina pectoris: Secondary | ICD-10-CM | POA: Insufficient documentation

## 2016-04-13 DIAGNOSIS — N368 Other specified disorders of urethra: Secondary | ICD-10-CM | POA: Insufficient documentation

## 2016-04-13 DIAGNOSIS — M19042 Primary osteoarthritis, left hand: Secondary | ICD-10-CM | POA: Insufficient documentation

## 2016-04-13 DIAGNOSIS — Z951 Presence of aortocoronary bypass graft: Secondary | ICD-10-CM | POA: Insufficient documentation

## 2016-04-13 DIAGNOSIS — C672 Malignant neoplasm of lateral wall of bladder: Secondary | ICD-10-CM | POA: Diagnosis not present

## 2016-04-13 DIAGNOSIS — Z950 Presence of cardiac pacemaker: Secondary | ICD-10-CM | POA: Diagnosis not present

## 2016-04-13 DIAGNOSIS — I252 Old myocardial infarction: Secondary | ICD-10-CM | POA: Insufficient documentation

## 2016-04-13 DIAGNOSIS — Z8572 Personal history of non-Hodgkin lymphomas: Secondary | ICD-10-CM | POA: Insufficient documentation

## 2016-04-13 DIAGNOSIS — I200 Unstable angina: Secondary | ICD-10-CM | POA: Diagnosis not present

## 2016-04-13 DIAGNOSIS — D414 Neoplasm of uncertain behavior of bladder: Secondary | ICD-10-CM | POA: Diagnosis not present

## 2016-04-13 DIAGNOSIS — I739 Peripheral vascular disease, unspecified: Secondary | ICD-10-CM | POA: Insufficient documentation

## 2016-04-13 DIAGNOSIS — C679 Malignant neoplasm of bladder, unspecified: Secondary | ICD-10-CM | POA: Diagnosis not present

## 2016-04-13 DIAGNOSIS — M19041 Primary osteoarthritis, right hand: Secondary | ICD-10-CM | POA: Insufficient documentation

## 2016-04-13 HISTORY — DX: Presence of coronary angioplasty implant and graft: Z95.5

## 2016-04-13 HISTORY — DX: Gastro-esophageal reflux disease without esophagitis: K21.9

## 2016-04-13 HISTORY — DX: Unspecified cataract: H26.9

## 2016-04-13 HISTORY — DX: Hematuria, unspecified: R31.9

## 2016-04-13 HISTORY — DX: Neoplasm of unspecified behavior of bladder: D49.4

## 2016-04-13 HISTORY — DX: Old myocardial infarction: I25.2

## 2016-04-13 HISTORY — DX: Ischemic cardiomyopathy: I25.5

## 2016-04-13 HISTORY — PX: TRANSURETHRAL RESECTION OF BLADDER TUMOR: SHX2575

## 2016-04-13 HISTORY — DX: Presence of spectacles and contact lenses: Z97.3

## 2016-04-13 HISTORY — DX: Unspecified glaucoma: H40.9

## 2016-04-13 HISTORY — DX: Presence of aortocoronary bypass graft: Z95.1

## 2016-04-13 HISTORY — PX: CYSTOSCOPY W/ RETROGRADES: SHX1426

## 2016-04-13 LAB — POCT I-STAT 4, (NA,K, GLUC, HGB,HCT)
Glucose, Bld: 106 mg/dL — ABNORMAL HIGH (ref 65–99)
HCT: 39 % (ref 39.0–52.0)
Hemoglobin: 13.3 g/dL (ref 13.0–17.0)
Potassium: 4.2 mmol/L (ref 3.5–5.1)
Sodium: 143 mmol/L (ref 135–145)

## 2016-04-13 SURGERY — TURBT (TRANSURETHRAL RESECTION OF BLADDER TUMOR)
Anesthesia: General | Site: Renal

## 2016-04-13 MED ORDER — PROPOFOL 10 MG/ML IV BOLUS
INTRAVENOUS | Status: DC | PRN
  Administered 2016-04-13: 100 mg via INTRAVENOUS

## 2016-04-13 MED ORDER — TRAMADOL HCL 50 MG PO TABS
50.0000 mg | ORAL_TABLET | Freq: Once | ORAL | Status: AC
  Administered 2016-04-13: 50 mg via ORAL
  Filled 2016-04-13: qty 1

## 2016-04-13 MED ORDER — ONDANSETRON HCL 4 MG/2ML IJ SOLN
4.0000 mg | Freq: Once | INTRAMUSCULAR | Status: DC | PRN
Start: 2016-04-13 — End: 2016-04-13
  Filled 2016-04-13: qty 2

## 2016-04-13 MED ORDER — IOHEXOL 300 MG/ML  SOLN
INTRAMUSCULAR | Status: DC | PRN
  Administered 2016-04-13: 10 mL

## 2016-04-13 MED ORDER — PHENYLEPHRINE HCL 10 MG/ML IJ SOLN
INTRAMUSCULAR | Status: AC
  Filled 2016-04-13: qty 1

## 2016-04-13 MED ORDER — MEPERIDINE HCL 25 MG/ML IJ SOLN
6.2500 mg | INTRAMUSCULAR | Status: DC | PRN
  Filled 2016-04-13: qty 1

## 2016-04-13 MED ORDER — OXYCODONE HCL 5 MG PO TABS
5.0000 mg | ORAL_TABLET | Freq: Once | ORAL | Status: DC | PRN
  Filled 2016-04-13: qty 1

## 2016-04-13 MED ORDER — HYDROMORPHONE HCL 1 MG/ML IJ SOLN
0.2500 mg | INTRAMUSCULAR | Status: DC | PRN
  Administered 2016-04-13: 0.5 mg via INTRAVENOUS
  Filled 2016-04-13: qty 1

## 2016-04-13 MED ORDER — TRAMADOL HCL 50 MG PO TABS
ORAL_TABLET | ORAL | Status: AC
  Filled 2016-04-13: qty 1

## 2016-04-13 MED ORDER — PHENYLEPHRINE HCL 10 MG/ML IJ SOLN
INTRAVENOUS | Status: DC | PRN
  Administered 2016-04-13: 30 ug/min via INTRAVENOUS

## 2016-04-13 MED ORDER — FENTANYL CITRATE (PF) 100 MCG/2ML IJ SOLN
INTRAMUSCULAR | Status: DC | PRN
  Administered 2016-04-13: 25 ug via INTRAVENOUS
  Administered 2016-04-13: 50 ug via INTRAVENOUS

## 2016-04-13 MED ORDER — DEXAMETHASONE SODIUM PHOSPHATE 10 MG/ML IJ SOLN
INTRAMUSCULAR | Status: AC
  Filled 2016-04-13: qty 1

## 2016-04-13 MED ORDER — LIDOCAINE 2% (20 MG/ML) 5 ML SYRINGE
INTRAMUSCULAR | Status: AC
  Filled 2016-04-13: qty 5

## 2016-04-13 MED ORDER — DEXAMETHASONE SODIUM PHOSPHATE 4 MG/ML IJ SOLN
INTRAMUSCULAR | Status: DC | PRN
  Administered 2016-04-13: 10 mg via INTRAVENOUS

## 2016-04-13 MED ORDER — TRAMADOL HCL 50 MG PO TABS: 50.0000 mg | ORAL_TABLET | Freq: Four times a day (QID) | ORAL | 0 refills | Status: DC | PRN

## 2016-04-13 MED ORDER — LIDOCAINE 2% (20 MG/ML) 5 ML SYRINGE
INTRAMUSCULAR | Status: DC | PRN
  Administered 2016-04-13: 100 mg via INTRAVENOUS

## 2016-04-13 MED ORDER — OXYCODONE HCL 5 MG/5ML PO SOLN
5.0000 mg | Freq: Once | ORAL | Status: DC | PRN
  Filled 2016-04-13: qty 5

## 2016-04-13 MED ORDER — FENTANYL CITRATE (PF) 100 MCG/2ML IJ SOLN
INTRAMUSCULAR | Status: AC
  Filled 2016-04-13: qty 2

## 2016-04-13 MED ORDER — HYDROMORPHONE HCL 1 MG/ML IJ SOLN
INTRAMUSCULAR | Status: AC
  Filled 2016-04-13: qty 1

## 2016-04-13 MED ORDER — ONDANSETRON HCL 4 MG/2ML IJ SOLN
INTRAMUSCULAR | Status: AC
  Filled 2016-04-13: qty 2

## 2016-04-13 MED ORDER — PROPOFOL 10 MG/ML IV BOLUS
INTRAVENOUS | Status: AC
  Filled 2016-04-13: qty 20

## 2016-04-13 MED ORDER — CEPHALEXIN 500 MG PO CAPS: 500.0000 mg | ORAL_CAPSULE | Freq: Two times a day (BID) | ORAL | 0 refills | Status: DC

## 2016-04-13 MED ORDER — CIPROFLOXACIN IN D5W 200 MG/100ML IV SOLN
200.0000 mg | INTRAVENOUS | Status: AC
  Administered 2016-04-13: 200 mg via INTRAVENOUS
  Filled 2016-04-13: qty 100

## 2016-04-13 MED ORDER — MITOMYCIN CHEMO FOR BLADDER INSTILLATION 40 MG
40.0000 mg | Freq: Once | INTRAVENOUS | Status: AC
  Administered 2016-04-13: 40 mg via INTRAVESICAL
  Filled 2016-04-13: qty 40

## 2016-04-13 MED ORDER — LACTATED RINGERS IV SOLN
INTRAVENOUS | Status: DC
  Administered 2016-04-13 (×2): via INTRAVENOUS
  Filled 2016-04-13: qty 1000

## 2016-04-13 MED ORDER — CIPROFLOXACIN IN D5W 200 MG/100ML IV SOLN
INTRAVENOUS | Status: AC
  Filled 2016-04-13: qty 100

## 2016-04-13 MED ORDER — SUCCINYLCHOLINE CHLORIDE 20 MG/ML IJ SOLN
INTRAMUSCULAR | Status: DC | PRN
  Administered 2016-04-13: 100 mg via INTRAVENOUS

## 2016-04-13 MED ORDER — SODIUM CHLORIDE 0.9 % IR SOLN
Status: DC | PRN
  Administered 2016-04-13: 9000 mL

## 2016-04-13 SURGICAL SUPPLY — 28 items
BAG DRAIN URO-CYSTO SKYTR STRL (DRAIN) ×4 IMPLANT
BAG DRN ANRFLXCHMBR STRAP LEK (BAG)
BAG DRN UROCATH (DRAIN) ×2
BAG URINE DRAINAGE (UROLOGICAL SUPPLIES) IMPLANT
BAG URINE LEG 19OZ MD ST LTX (BAG) IMPLANT
BAG URINE LEG 500ML (DRAIN) IMPLANT
CATH FOLEY 2WAY SLVR  5CC 22FR (CATHETERS)
CATH FOLEY 2WAY SLVR 30CC 20FR (CATHETERS) IMPLANT
CATH FOLEY 2WAY SLVR 5CC 22FR (CATHETERS) IMPLANT
CATH HEMA 3WAY 30CC 22FR COUDE (CATHETERS) ×2 IMPLANT
CATH INTERMIT  6FR 70CM (CATHETERS) ×2 IMPLANT
CLOTH BEACON ORANGE TIMEOUT ST (SAFETY) ×4 IMPLANT
ELECT REM PT RETURN 9FT ADLT (ELECTROSURGICAL) ×4
ELECTRODE REM PT RTRN 9FT ADLT (ELECTROSURGICAL) ×2 IMPLANT
EVACUATOR MICROVAS BLADDER (UROLOGICAL SUPPLIES) IMPLANT
GLOVE BIO SURGEON STRL SZ7.5 (GLOVE) ×4 IMPLANT
GOWN STRL REUS W/ TWL LRG LVL3 (GOWN DISPOSABLE) ×2 IMPLANT
GOWN STRL REUS W/TWL LRG LVL3 (GOWN DISPOSABLE) ×12
IV NS IRRIG 3000ML ARTHROMATIC (IV SOLUTION) ×6 IMPLANT
KIT ROOM TURNOVER WOR (KITS) ×4 IMPLANT
LOOP CUT BIPOLAR 24F LRG (ELECTROSURGICAL) ×2 IMPLANT
MANIFOLD NEPTUNE II (INSTRUMENTS) ×2 IMPLANT
PACK CYSTO (CUSTOM PROCEDURE TRAY) ×4 IMPLANT
PLUG CATH AND CAP STER (CATHETERS) ×2 IMPLANT
SET ASPIRATION TUBING (TUBING) IMPLANT
SYRINGE IRR TOOMEY STRL 70CC (SYRINGE) IMPLANT
TUBE CONNECTING 12'X1/4 (SUCTIONS) ×1
TUBE CONNECTING 12X1/4 (SUCTIONS) ×1 IMPLANT

## 2016-04-13 NOTE — Discharge Instructions (Signed)
1 - You may have urinary urgency (bladder spasms) and bloody urine on / off x few days. This is normal.  2 - Call MD or go to ER for fever >102, severe pain / nausea / vomiting not relieved by medications, or acute change in medical status  3 - You may resume aspirin after catheter removal on Monday and other blood thinners the following day as long as not having visible blood in urine.   CYSTOSCOPY HOME CARE INSTRUCTIONS  Activity: Rest for the remainder of the day.  Do not drive or operate equipment today.  You may resume normal activities in one to two days as instructed by your physician.   Meals: Drink plenty of liquids and eat light foods such as gelatin or soup this evening.  You may return to a normal meal plan tomorrow.  Return to Work: You may return to work in one to two days or as instructed by your physician.  Special Instructions / Symptoms: Call your physician if any of these symptoms occur:   -persistent or heavy bleeding  -bleeding which continues after first few urination  -large blood clots that are difficult to pass  -urine stream diminishes or stops completely  -fever equal to or higher than 101 degrees Farenheit.  -cloudy urine with a strong, foul odor  -severe pain  Females should always wipe from front to back after elimination.  You may feel some burning pain when you urinate.  This should disappear with time.  Applying moist heat to the lower abdomen or a hot tub bath may help relieve the pain. \  Follow-Up / Date of Return Visit to Your Physician: Call for an appointment to arrange follow-up.  Patient Signature:  ________________________________________________________  Nurse's Signature:  ________________________________________________________   Transurethral Resection, Bladder Tumor A cancerous growth (tumor) can develop on the inside wall of the bladder. The bladder is the organ that holds urine. One way to remove the tumor is a procedure called a  transurethral resection. The tumor is removed (resected) through the tube that carries urine from the bladder out of the body (urethra). No cuts (incisions) are made in the skin. Instead, the procedure is done through a thin telescope, called a resectoscope. Attached to it is a light and usually a tiny camera. The resectoscope is put into the urethra. In men, the urethra opens at the end of the penis. In women, it opens just above the vagina.  A transurethral resection is usually used to remove tumors that have not gotten too big or too deep. These are called Stage 0, Stage 1 or Stage 2 bladder cancers. LET YOUR CAREGIVER KNOW ABOUT:  On the day of the procedure, your caregivers will need to know the last time you had anything to eat or drink. This includes water, gum, and candy. In advance, make sure they know about:   Any allergies.  All medications you are taking, including:  Herbs, eyedrops, over-the-counter medications and creams.  Blood thinners (anticoagulants), aspirin or other drugs that could affect blood clotting.  Use of steroids (by mouth or as creams).  Previous problems with anesthetics, including local anesthetics.  Possibility of pregnancy, if this applies.  Any history of blood clots.  Any history of bleeding or other blood problems.  Previous surgery.  Smoking history.  Any recent symptoms of colds or infections.  Other health problems. RISKS AND COMPLICATIONS This is usually a safe procedure. Every procedure has risks, though. For a transurethral resection, they include:  Infection. Antibiotic medication would need to be taken.  Bleeding.  Light bleeding may last for several days after the procedure.  If bleeding continues or is heavy, the bladder may need rinsing. Or, a new catheter might be put in for awhile.  Sometimes bed rest is needed.  Urination problems.  Pain and burning can occur when urinating. This usually goes away in a few  days.  Scarring from the procedure can block the flow of urine.  Bladder damage.  It can be punctured or torn during removal of the tumor. If this happens, a catheter might be needed for longer. Antibiotics would be taken while the bladder heals.  Urine can leak through the hole or tear into the abdomen. If this happens, surgery may be needed to repair the bladder. BEFORE THE PROCEDURE   A medical evaluation will be done. This may include:  A physical examination.  Urine test. This is to make sure you do not have a urinary tract infection.  Blood tests.  A test that checks the heart's rhythm (electrocardiogram).  Talking with an anesthesiologist. This is the person who will be in charge of the medication (anesthesia) to keep you from feeling pain during the transurethral resection. You might be asleep during the procedure (general anesthesia) or numb from the waist down, but awake during the procedure (spinal anesthesia). Ask your surgeon what to expect.  The person who is having a transurethral resection needs to give what is called informed consent. This requires signing a legal paper that gives permission for the procedure. To give informed consent:  You must understand how the procedure is done and why.  You must be told all the risks and benefits of the procedure.  You must sign the consent. Sometimes a legal guardian can do this.  Signing should be witnessed by a healthcare professional.  The day before the surgery, eat only a light dinner. Then, do not eat or drink anything for at least 8 hours before the surgery. Ask your caregiver if it is OK to take any needed medicines with a sip of water.  Arrive at least an hour before the surgery or whenever your surgeon recommends. This will give you time to check in and fill out any needed paperwork. PROCEDURE  The preparation:  You will change into a hospital gown.  A needle will be inserted in your arm. This is an  intravenous access tube (IV). Medication will be able to flow directly into your body through this needle.  Small monitors will be put on your body. They are used to check your heart, blood pressure, and oxygen level.  You might be given medication that will help you relax (sedative).  You will be given a general anesthetic or spinal anesthesia.  The procedure:  Once you are asleep or numb from the waist down, your legs will be placed in stirrups.  The resectoscope will be passed through the urethra into the bladder.  Fluid will be passed through the resectoscope. This will fill the bladder with water.  The surgeon will examine the bladder through the scope. If the scope has a camera, it can take pictures from inside the bladder. They can be projected onto a TV screen.  The surgeon will use various tools to remove the tumor in small pieces. Sometimes a laser (a beam of light energy) is used. Other tools may use electric current.  A tube (catheter) will often be placed so that urine can drain into a bag  outside the body. This process helps stop bleeding. This tube keeps blood clots from blocking the urethra.  The procedure usually takes 30 to 45 minutes. AFTER THE PROCEDURE   You will stay in a recovery area until the anesthesia has worn off. Your blood pressure and pulse will be checked every so often. Then you will be taken to a hospital room.  You may continue to get fluids through the IV for awhile.  Some pain is normal. The catheter might be uncomfortable. Pain is usually not severe. If it is, ask for pain medicine.  Your urine may look bloody after a transurethral resection. This is normal.  If bleeding is heavy, a hospital caregiver may rinse out the bladder (irrigation) through the catheter.  Once the urine is clear, the catheter will be taken out.  You will need to stay in the hospital until you can urinate on your own.  Transurethral resection is considered the best  way to treat bladder tumors that are not too far along. For most people, the treatment is successful. Sometimes, though, more treatment is needed.  Bladder cancers can come back even after a successful procedure. Because of this, be sure to have a checkup with your caregiver every 3 to 6 months. If everything is OK for 3 years, you can reduce the checkups to once a year.   This information is not intended to replace advice given to you by your health care provider. Make sure you discuss any questions you have with your health care provider.   Document Released: 05/19/2009 Document Revised: 10/15/2011 Document Reviewed: 07/25/2009 Elsevier Interactive Patient Education 2016 [place] Anesthesia Home Care Instructions  Activity: Get plenty of rest for the remainder of the day. A responsible adult should stay with you for 24 hours following the procedure.  For the next 24 hours, DO NOT: -Drive a car -Paediatric nurse -Drink alcoholic beverages -Take any medication unless instructed by your physician -Make any legal decisions or sign important papers.  Meals: Start with liquid foods such as gelatin or soup. Progress to regular foods as tolerated. Avoid greasy, spicy, heavy foods. If nausea and/or vomiting occur, drink only clear liquids until the nausea and/or vomiting subsides. Call your physician if vomiting continues.  Special Instructions/Symptoms: Your throat may feel dry or sore from the anesthesia or the breathing tube placed in your throat during surgery. If this causes discomfort, gargle with warm salt water. The discomfort should disappear within 24 hours.  If you had a scopolamine patch placed behind your ear for the management of post- operative nausea and/or vomiting:  1. The medication in the patch is effective for 72 hours, after which it should be removed.  Wrap patch in a tissue and discard in the trash. Wash hands thoroughly with soap and water. 2. You may  remove the patch earlier than 72 hours if you experience unpleasant side effects which may include dry mouth, dizziness or visual disturbances. 3. Avoid touching the patch. Wash your hands with soap and water after contact with the patch.

## 2016-04-13 NOTE — Anesthesia Procedure Notes (Signed)
Procedure Name: LMA Insertion Date/Time: 04/13/2016 8:24 AM Performed by: Denna Haggard D Pre-anesthesia Checklist: Patient identified, Emergency Drugs available, Suction available and Patient being monitored Patient Re-evaluated:Patient Re-evaluated prior to inductionOxygen Delivery Method: Circle system utilized Preoxygenation: Pre-oxygenation with 100% oxygen Intubation Type: IV induction Ventilation: Mask ventilation without difficulty LMA: LMA inserted LMA Size: 4.0 Number of attempts: 1 Airway Equipment and Method: Bite block Placement Confirmation: positive ETCO2 Tube secured with: Tape Dental Injury: Teeth and Oropharynx as per pre-operative assessment

## 2016-04-13 NOTE — Anesthesia Postprocedure Evaluation (Signed)
Anesthesia Post Note  Patient: David [name]  Procedure(s) Performed: Procedure(s) (LRB): TRANSURETHRAL RESECTION OF BLADDER TUMOR (TURBT) with clot evacuation (N/A) CYSTOSCOPY WITH RETROGRADE PYELOGRAM (Bilateral)  Patient location during evaluation: PACU Anesthesia Type: General Level of consciousness: awake and alert Pain management: pain level controlled Vital Signs Assessment: post-procedure vital signs reviewed and stable Respiratory status: spontaneous breathing, nonlabored ventilation and respiratory function stable Cardiovascular status: blood pressure returned to baseline and stable Postop Assessment: no signs of nausea or vomiting Anesthetic complications: no    Last Vitals:  Vitals:   04/13/16 0921 04/13/16 0930  BP: (!) 169/72 (!) 156/81  Pulse: 64 61  Resp: 15 15  Temp: 36.4 C     Last Pain:  Vitals:   04/13/16 0653  TempSrc: Oral                 Elizeo Rodriques A

## 2016-04-13 NOTE — H&P (Signed)
David [name] is an 82 y.o. male.    Chief Complaint: Pre-op Transurethral Resection of Bladder Tumor  HPI:   1 - Bladder Cancer - new bladder cancer diagnosed 03/2016 on eval gross hematuria. Staging CT clinically localized. On plavix for cardiac indication which he is holding as recommended by his cardioloist.  Today "David [name]" is seen to proceed with TURBT / retrogrades for initial manamgent of his bladder cancer. No interval fevers of urinary retention.   Past Medical History:  Diagnosis Date  . Arthritis    "hands" (10/19/2013)  . Bladder tumor   . CAD (coronary artery disease)    cardiologist-  dr crouitoru  . Carotid artery disease (HCC)    moderate pRICA [id number],  123456 LICA , [id number] RECA last duplex 08-05-2015  . Cataract of both eyes   . Complete heart block ([place])   . GERD (gastroesophageal reflux disease)   . Glaucoma, both eyes   . Hematuria   . History of MI (myocardial infarction)    1989  . Hyperlipidemia   . Hypertension   . Ischemic cardiomyopathy    ef 40-45%  without clinical heart failure per cardiology note 12-28-2015 (nuclear stress ef 43% from 07-28-2015)  . Left ventricular dysfunction   . Non Hodgkin's lymphoma ([place]) dx 06/2010---  oncologist-  dr Benay Spice---  in clinical remission since 2013   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 -- lymphadenopathy to neck, mediastinal , and abdominal--  chemotherapy completed 06/ 2011  . NSVT (nonsustained ventricular tachycardia) [facility]) cardiologist-  dr crouitoru   Max rate 200 bpm, 28-beat, nearly 9-second episode recorded by pacemaker on November 15, asymptomatic   . Pacemaker pacemaker dependant due to CHB   1st Pacemaker Placement 1998 w/ New generator  12-31-2000 ; 08-04-2008;  11-29-2015  . PAD (peripheral artery disease) ([place])    followed by dr berry--- last duplex 08-05-2015  patents bilateral popliteal stents, bilateral SFA 30-49% stenosis  . Peripheral vascular disease with claudication ([place])     dx 2009 left popliteal occlusion with collaterals; 03- 2015  s/p  stenting right popliteal  by dr berry and left popliteal stenting by dr Andree Elk ([facility]) july 2015  . S/P CABG x 5    09-10-1988  . S/P drug eluting coronary stent placement 01/04/2006   01-04-2006--- DES to SVG to OM1, OM2, and IR, and SVG to PDA(x2 grafts)  and 03-04-2013  DES to SVR to OM  . Wears glasses     Past Surgical History:  Procedure Laterality Date  . ANAL FISSURE REPAIR  12-09-2000 and 1970's  . CARDIAC CATHETERIZATION  04/09/2000   patent grafts  . CARDIAC PACEMAKER PLACEMENT  11-15-1993   dr Cyndia Bent  . CARDIOVASCULAR STRESS TEST  07-28-2015  dr crouitoru   Intermediate risk nuclear study w/ large, severe, predominantly fixed inferior and apical defect; minimal reversibility inferior lateral wall; findings consistent w/ large prior infarct and trivial peri-infarct ischemia;  ef 43% w/ inferior hypokinesis and apical akinesis; mild LVE  . CORONARY ANGIOPLASTY WITH STENT PLACEMENT  01-04-2006  dr gamble   DES to pSVG to OM1, SVG to OM2 &  IR, and mSVG to RCA dPDA (x2 grafts );  pLAD , pLCFX and  mRCA occluded;  LIMA to LAD mid-distal patent with occlusion beyond LIMA with collateral flow;  ef 30%  . CORONARY ARTERY BYPASS GRAFT  09/10/1988   dr Redmond Pulling   "CABG X5" (03/04/2013) LIMA to LAD,SVG to imtermediate OM1 OM II,SVG  to PDA  . EP IMPLANTABLE DEVICE N/A 11/29/2015   Procedure: Pacemaker Implant;  Surgeon: Sanda Klein, MD;  Location: [facility];  Service: Cardiovascular;  Laterality: N/A;  new generator and new atrial, vertricular leads;  Patent attorney  . LEFT HEART CATHETERIZATION WITH CORONARY/GRAFT ANGIOGRAM N/A 03/04/2013   Procedure: LEFT HEART CATHETERIZATION WITH Beatrix Fetters;  Surgeon: Troy Sine, MD;  Location[facility] C[facility] LAB;  Service: Cardiovascular;  Laterality: N/A;  . LOWER EXTREMITY ANGIOGRAM N/A 08/31/2013   Procedure: LOWER EXTREMITY ANGIOGRAM;  Surgeon:  Lorretta Harp, MD;  Location: [facility] CATH LAB;  Service: Cardiovascular;  Laterality: N/A;  . PACEMAKER REPLACEMENT  12/31/2000;  08-04-2008  . PERCUTANEOUS CORONARY STENT INTERVENTION (PCI-S)  03/04/2013   Procedure: PERCUTANEOUS CORONARY STENT INTERVENTION (PCI-S);  Surgeon: Troy Sine, MD;  Location: [facility] CATH LAB;  Service: Cardiovascular;;  DES to S-OM for in-stent restenosis  . POPLITEAL ARTERY STENT Right 10-21-2013  dr berry   atherectomy, PTA , and IDEV stent x1  . POPLITEAL ARTERY STENT Left 07/ 2015  dr Andree [facility]k at [facility]   angioplasty / stent x2  . 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 SUPERFICIAL LYMPH NODE BIOPSY / EXCISION Left 2011   cervical lymph node  . VENOGRAM Left 11/29/2015   Procedure: Venogram;  Surgeon: Sanda Klein, MD;  Location: [facility];  Service: Cardiovascular;  Laterality: Left;    Family History  Problem Relation Age of Onset  . Diabetes Mother   . Stroke Mother   . Heart attack Father   . Hypertension Sister   . Hyperlipidemia Sister   . Hypertension Sister   . Hyperlipidemia Sister    Social History:  reports that he quit smoking about 59 years ago. His smoking use included Cigarettes. He has a 0.12 pack-year smoking history. He has never used smokeless tobacco. He reports that he does not drink alcohol or use drugs.  Allergies:  Allergies  Allergen Reactions  . Azithromycin Diarrhea  . Lactose Intolerance (Gi) Diarrhea  . Niacin And Related Diarrhea  . Sulfa Antibiotics Rash    No prescriptions prior to admission.    No results found for this or any previous visit (from the past 48 hour(s)). No results found.  Review of Systems  Constitutional: Negative.   HENT: Negative.   Eyes: Negative.   Respiratory: Negative.   Cardiovascular: Negative.   Gastrointestinal: Negative.   Genitourinary: Positive for hematuria.  Musculoskeletal: Negative.   Skin: Negative.   Endo/Heme/Allergies: Negative.    Psychiatric/Behavioral: Negative.     Height 5' 9.75" (1.772 m), weight 93 kg (205 lb). Physical Exam  Constitutional: He appears well-developed.  HENT:  Head: Normocephalic.  Eyes: Pupils are equal, round, and reactive to light.  Cardiovascular: Normal rate.   Respiratory: Effort normal.  GI: Soft.  Genitourinary:  Genitourinary Comments: No CVAT  Neurological: He is alert.  Skin: Skin is warm.  Psychiatric: He has a normal mood and affect.     Assessment/Plan  1 - Bladder Cancer - proceed as planned with TURBT / Retrogrades. Risks, benefits, alternatives, and expected peri-op course discussed as well as need for possible temporary ureteral stents or bladder catheters. Whe voiced understanding and desire to proceed as planned.   Alexis Frock, MD 04/13/2016, 6:29 AM

## 2016-04-13 NOTE — Anesthesia Preprocedure Evaluation (Signed)
Anesthesia Evaluation  Patient identified by MRN, date of birth, ID band Patient awake    Reviewed: Allergy & Precautions, NPO status , Patient's Chart, lab work & pertinent test results, reviewed documented beta blocker date and time   Airway Mallampati: I  TM Distance: >3 FB Neck ROM: Full    Dental  (+) Teeth Intact, Dental Advisory Given   Pulmonary former smoker,    breath sounds clear to auscultation       Cardiovascular hypertension, Pt. on medications and Pt. on home beta blockers + CAD, + Cardiac Stents and + CABG (27 years ago)  + pacemaker  Rhythm:Regular Rate:Normal     Neuro/Psych    GI/Hepatic   Endo/Other    Renal/GU      Musculoskeletal   Abdominal   Peds  Hematology   Anesthesia Other Findings No angina at present  Reproductive/Obstetrics                             Anesthesia Physical Anesthesia Plan  ASA: III  Anesthesia Plan: General   Post-op Pain Management:    Induction: Intravenous  Airway Management Planned: LMA  Additional Equipment:   Intra-op Plan:   Post-operative Plan: Extubation in OR  Informed Consent: I have reviewed the patients History and Physical, chart, labs and discussed the procedure including the risks, benefits and alternatives for the proposed anesthesia with the patient or authorized representative who has indicated his/her understanding and acceptance.   Dental advisory given  Plan Discussed with: CRNA, Anesthesiologist and Surgeon  Anesthesia Plan Comments:         Anesthesia Quick Evaluation

## 2016-04-13 NOTE — Brief Op Note (Signed)
04/13/2016  9:09 AM  PATIENT:  Birdie Sons  82 y.o. male  PRE-OPERATIVE DIAGNOSIS:  bladder tumor  POST-OPERATIVE DIAGNOSIS:  bladder tumor  PROCEDURE:  Procedure(s): TRANSURETHRAL RESECTION OF BLADDER TUMOR (TURBT) with clot evacuation (N/A) CYSTOSCOPY WITH RETROGRADE PYELOGRAM (Bilateral)  SURGEON:  Surgeon(s) and Role:    * Alexis Frock, MD - Primary  PHYSICIAN ASSISTANT:   ASSISTANTS: none   ANESTHESIA:   general  EBL:  Total I/O In: 100 [I.V.:100] Out: 0   BLOOD ADMINISTERED:none  DRAINS: 50f 3 way foley to gravity, irrigation port plugged   LOCAL MEDICATIONS USED:  NONE  SPECIMEN:  Source of Specimen:  1 - bladder tumor, 2 - base of bladder tumor, 3 - prostatic urethra  DISPOSITION OF SPECIMEN:  PATHOLOGY  COUNTS:  YES  TOURNIQUET:  * No tourniquets in log *  DICTATION: .Other Dictation: Dictation Number [id number]  PLAN OF CARE: Discharge to home after PACU  PATIENT DISPOSITION:  PACU - hemodynamically stable.   Delay start of Pharmacological VTE agent (>24hrs) due to surgical blood loss or risk of bleeding: yes

## 2016-04-13 NOTE — Transfer of Care (Signed)
Immediate Anesthesia Transfer of Care Note  Patient: David [name]  Procedure(s) Performed: Procedure(s) (LRB): TRANSURETHRAL RESECTION OF BLADDER TUMOR (TURBT) with clot evacuation (N/A) CYSTOSCOPY WITH RETROGRADE PYELOGRAM (Bilateral)  Patient Location: PACU  Anesthesia Type: General  Level of Consciousness: awake, oriented, sedated and patient cooperative  Airway & Oxygen Therapy: Patient Spontanous Breathing and Patient connected to face mask oxygen  Post-op Assessment: Report given to PACU RN and Post -op Vital signs reviewed and stable  Post vital signs: Reviewed and stable  Complications: No apparent anesthesia complications

## 2016-04-14 ENCOUNTER — Emergency Department (HOSPITAL_COMMUNITY)
Admission: EM | Admit: 2016-04-14 | Discharge: 2016-04-14 | Disposition: A | Discharge status: Home / Self Care | Payer: Medicare Other

## 2016-04-16 ENCOUNTER — Encounter (HOSPITAL_BASED_OUTPATIENT_CLINIC_OR_DEPARTMENT_OTHER): Payer: Self-pay | Admitting: Urology

## 2016-04-16 NOTE — Op Note (Signed)
[name], [name] NO.:  [id number]  MEDICAL RECORD NO.:  [id number]  LOCATION:                                 FACILITY:  PHYSICIAN:  Alexis Frock, MD     DATE OF BIRTH:  1933-05-09  DATE OF PROCEDURE: 04/13/2016                              OPERATIVE REPORT   PREOPERATIVE DIAGNOSIS:  Gross hematuria with clots, bladder tumor.  POSTOPERATIVE DIAGNOSIS:  Gross hematuria with clots, bladder tumor.  PROCEDURES: 1. Transection of bladder tumor, volume medium. 2. Clot evacuation and fulguration of bleeders. 3. Bilateral retrograde pyelogram interpretation. 4. Insertion of mitomycin-C chemotherapeutic agent.  ESTIMATED BLOOD LOSS:  100 mL.  COMPLICATIONS:  None.  SPECIMEN: 1. Bladder tumor. 2. Base of bladder tumor. 3. Prostatic urethra.  FINDINGS: 1. Approximately 3 to 4 mL total formed clot in the urinary bladder.     This was irrigated. 2. Papillary bladder tumor approximately 3 cm diameter from the left     anterior surface of the bladder, approximately 2 o'clock position. 3. Complete resolution of all visible bladder tumor following     transection of bladder tumor. 4. No evidence of bladder perforation following transurethral     resection of bladder tumor. 5. Unremarkable bilateral retrograde pyelograms. 6. Somewhat friable prostatic urethra, gross tumor, this was biopsied.  DRAINS:  A 22-French 3-way Foley catheter to straight drain irrigation, port plugged.  INDICATION:  David [name] is a pleasant 82 year old gentleman with extensive cardiovascular history with both coronary and peripheral vascular disease.  He recently had gross hematuria.  Evaluation with CAT scan and cystoscopy revealed a papillary bladder tumor as likely etiology.  His hematuria persisted despite discontinuation of his anticoagulation As advised  by his cardiologist.  Options were discussed including transurethral resection, clot evacuation for diagnostic and  therapeutic purposes and wished to proceed.  Informed consent was obtained and placed in the medical record.  PROCEDURE IN DETAIL:  The patient being David [name] verified.  Procedure being transurethral resection of bladder tumor was confirmed.  Procedure was carried out.  Time-out was performed.  Intravenous antibiotics were administered.  General LMA anesthesia was induced.  The patient was placed into a low lithotomy position.  Sterile field was created by prepping the patient's penis, perineum, proximal thighs using iodine x3. Next, cystourethroscopy was performed using a 21-French rigid cystoscope with offset lens.  Inspection of the urinary bladder revealed significant blood in the urine.  There was some formed clot, approximately 25mL irrigated with a Toomey syringe via the cystoscope for discard.  The bladder was irrigated multiple times to remove additional small clot.  This allowed better inspection of the entire bladder which revealed a papillary tumor approximately golf ball size, 3 cm from the 2 o'clock position at the bladder neck.  There was some friable bleeding mucosa of the prostatic urethra without obvious papillary changes.  Attention was then directed to the retrograde pyelography.  The right ureteral orifice was cannulated with 6-French end-hole catheter and right retrograde pyelogram was obtained.  Right retrograde pyelogram demonstrated a single right ureter with single-system right kidney.  No filling defects or narrowing noted. Similarly, left retrograde pyelogram was obtained.  Left retrograde pyelogram demonstrated single left ureter with single- system left kidney.  No filling defects or narrowing noted.  The cystoscope was exchanged for 26-French continuous flow resectoscope sheath with visual obturator.  Next, using a large size resectoscope loop, very careful systematic resection was performed of the bladder tumor down to  what appeared to be  fibromuscular stroma, taking exquisite care not to perforate the bladder which did not occur grossly. The edges of this were fulgurated.  These bladder tumor fragments were set aside, labeled as such.  Next, cold cup biopsy forceps were used to obtain seromuscular deep sample, set aside for permanent pathology, labeled base of bladder tumor.  Additional coagulation current was applied via the loop to this site and careful inspection following these maneuvers revealed intact bladder at this location.  Next, resectoscope was also used to obtain representative samples of the prostatic urethra given its mucosal friability to rule out carcinoma at this location as well.  This set aside and labeled as such.  Coagulation current being applied to the base of this as well.  Following these maneuvers, excellent hemostasis.  No evidence of bladder perforation.  The resectoscope was then exchanged for a 22-French 3-way Foley catheter. Irrigation port plugged.  The patient was awakened and taken to postanesthesia care unit in stable condition.  In the postanesthesia care unit, 40mg  in 40 mL of solution of mitomycin-C topical chemotherapeutic agent was instilled via the Foley catheter using aseptic technique and taking exquisite care to avoid the spillage of this agent which did not occur.  This was left in situ for an hour and then allowed to drain via Foley.  The patient will be discharged with Foley catheter in situ with trial of void in the office in several days.    ______________________________ Alexis Frock, MD   ______________________________ Alexis Frock, MD    TM/MEDQ  D:  04/13/2016  T:  04/14/2016  Job:  [id number]

## 2016-04-17 DIAGNOSIS — C672 Malignant neoplasm of lateral wall of bladder: Secondary | ICD-10-CM | POA: Diagnosis not present

## 2016-04-20 DIAGNOSIS — R310 Gross hematuria: Secondary | ICD-10-CM | POA: Diagnosis not present

## 2016-04-20 DIAGNOSIS — C672 Malignant neoplasm of lateral wall of bladder: Secondary | ICD-10-CM | POA: Diagnosis not present

## 2016-04-25 ENCOUNTER — Telehealth: Payer: Self-pay | Admitting: Cardiovascular Disease

## 2016-04-25 DIAGNOSIS — Z0000 Encounter for general adult medical examination without abnormal findings: Secondary | ICD-10-CM | POA: Diagnosis not present

## 2016-04-25 DIAGNOSIS — E782 Mixed hyperlipidemia: Secondary | ICD-10-CM | POA: Diagnosis not present

## 2016-04-25 DIAGNOSIS — I119 Hypertensive heart disease without heart failure: Secondary | ICD-10-CM | POA: Diagnosis not present

## 2016-04-25 DIAGNOSIS — I739 Peripheral vascular disease, unspecified: Secondary | ICD-10-CM | POA: Diagnosis not present

## 2016-04-25 DIAGNOSIS — Z23 Encounter for immunization: Secondary | ICD-10-CM | POA: Diagnosis not present

## 2016-04-25 DIAGNOSIS — Z8551 Personal history of malignant neoplasm of bladder: Secondary | ICD-10-CM | POA: Diagnosis not present

## 2016-04-25 DIAGNOSIS — C8590 Non-Hodgkin lymphoma, unspecified, unspecified site: Secondary | ICD-10-CM | POA: Diagnosis not present

## 2016-04-25 DIAGNOSIS — I2510 Atherosclerotic heart disease of native coronary artery without angina pectoris: Secondary | ICD-10-CM | POA: Diagnosis not present

## 2016-04-25 DIAGNOSIS — J301 Allergic rhinitis due to pollen: Secondary | ICD-10-CM | POA: Diagnosis not present

## 2016-04-25 DIAGNOSIS — K219 Gastro-esophageal reflux disease without esophagitis: Secondary | ICD-10-CM | POA: Diagnosis not present

## 2016-04-25 NOTE — Telephone Encounter (Signed)
Patient notes he went off plavix recently in context of cancer surgery, was instructed that he should remain off of plavix.  He notes that back when he had his first 2 stents, he was told he may need lifetime use of plavix - now has 4 stents and wants to make absolutely sure that it is safe for him to remain off plavix indefinitely.  Note that we spoke about this in a recent triage encounter and Dr. [name] stated: "I would recommend that he stop the Plavix. It has been several years since his last revascularization procedure and at this point the bleeding appears to outweigh the benefit. I would also recommend that he hold the aspirin for a few days until the hematuria stops, but it will be preferable that he restarts it once his urine clears."   Pt also informs me he got report on PM remote download via mychart - didn't really get any meaningful information and stated he just wanted plain language synopsis of his device interrogation. Pt aware I will route to Dr. [name] to f/u on and will contact him back later this week.

## 2016-04-25 NOTE — Telephone Encounter (Signed)
New message  Pt call requesting to speak with RN about the medication Plavix. Pt also would like to a few questions about his pacemaker. Please call back to discuss

## 2016-04-26 NOTE — Telephone Encounter (Signed)
Spoke to David [name] again. Patient given recommendations. He notes he will go without the Plavix for now but will take this into consideration for restarting. He voiced thanks for all instruction, and the interpretation of PM results. Pt aware to call for any further needs.

## 2016-04-26 NOTE — Telephone Encounter (Signed)
While Plavix is critical in the early period after stents or acute MI, in the longer term between acute episodes it has much lesser value (not zero, but much smaller). For that reason, bleeding complications become a good reason to stop it.   I am assuming that he has indeed already restarted the aspirin without problems? If that is the case and bleeding has not returned, we can restart Plavix at 75 mg daily (but my threshold for stopping it again if bleeding recurs is low).  Pacemaker function is normal. Battery is brand new. He is pacemaker dependent due to complete heart block. No meaningful arrhythmia recorded. I'll try to remember to put a "non-medicalese" translation in mychart at his next check!

## 2016-05-07 ENCOUNTER — Emergency Department (HOSPITAL_COMMUNITY)
Admission: EM | Admit: 2016-05-07 | Discharge: 2016-05-08 | Disposition: A | Discharge status: Home / Self Care | Payer: Medicare Other | Attending: Emergency Medicine | Admitting: Emergency Medicine

## 2016-05-07 ENCOUNTER — Encounter (HOSPITAL_COMMUNITY): Payer: Self-pay

## 2016-05-07 DIAGNOSIS — I2510 Atherosclerotic heart disease of native coronary artery without angina pectoris: Secondary | ICD-10-CM | POA: Diagnosis not present

## 2016-05-07 DIAGNOSIS — I10 Essential (primary) hypertension: Secondary | ICD-10-CM | POA: Diagnosis not present

## 2016-05-07 DIAGNOSIS — R339 Retention of urine, unspecified: Secondary | ICD-10-CM | POA: Diagnosis present

## 2016-05-07 DIAGNOSIS — N390 Urinary tract infection, site not specified: Secondary | ICD-10-CM | POA: Diagnosis not present

## 2016-05-07 DIAGNOSIS — Z87891 Personal history of nicotine dependence: Secondary | ICD-10-CM | POA: Diagnosis not present

## 2016-05-07 DIAGNOSIS — Z79899 Other long term (current) drug therapy: Secondary | ICD-10-CM | POA: Diagnosis not present

## 2016-05-07 DIAGNOSIS — R310 Gross hematuria: Secondary | ICD-10-CM

## 2016-05-07 LAB — CBC WITH DIFFERENTIAL/PLATELET
BASOS ABS: 0 10*3/uL (ref 0.0–0.1)
BASOS PCT: 0 %
EOS PCT: 2 %
Eosinophils Absolute: 0.2 10*3/uL (ref 0.0–0.7)
HEMATOCRIT: 33.3 % — AB (ref 39.0–52.0)
Hemoglobin: 11.7 g/dL — ABNORMAL LOW (ref 13.0–17.0)
LYMPHS ABS: 0.8 10*3/uL (ref 0.7–4.0)
Lymphocytes Relative: 10 %
MCH: 31 pg (ref 26.0–34.0)
MCHC: 35.1 g/dL (ref 30.0–36.0)
MCV: 88.1 fL (ref 78.0–100.0)
MONO ABS: 0.8 10*3/uL (ref 0.1–1.0)
MONOS PCT: 10 %
NEUTROS PCT: 78 %
Neutro Abs: 6.5 10*3/uL (ref 1.7–7.7)
PLATELETS: 172 10*3/uL (ref 150–400)
RBC: 3.78 MIL/uL — ABNORMAL LOW (ref 4.22–5.81)
RDW: 13.5 % (ref 11.5–15.5)
WBC: 8.4 10*3/uL (ref 4.0–10.5)

## 2016-05-07 MED ORDER — LIDOCAINE HCL 2 % EX GEL
1.0000 "application " | Freq: Once | CUTANEOUS | Status: DC | PRN
  Filled 2016-05-07: qty 11

## 2016-05-07 NOTE — ED Notes (Signed)
Bladder scan not working.

## 2016-05-07 NOTE — ED Provider Notes (Signed)
[facility] DEPT Provider Note   CSN: [id number] Arrival date & time: 05/07/16  2116  By signing my name below, I, David [name], attest that this documentation has been prepared under the direction and in the presence of Quintella Reichert, MD. Electronically Signed: [name], ED Scribe. 05/07/16. 12:54 AM.   History   Chief Complaint Chief Complaint  Patient presents with  . Urinary Retention    SINCE 4PM   The history is provided by the patient. No language interpreter was used.   HPI Comments: David [name] is a 82 y.o. male with PMHx CAD, GERD, HTN, HLD, and MI who presents to the Emergency Department complaining of constant urinary retention onset 4 PM today. Pt had a tumor removed from his bladder 3 weeks ago. 2 days ago he had decreased urine output and slight hematuria. At 4 PM today, he passed a large blood clot with no urine. Since 4 PM any time he has urinated, he has only passed blood with no urine. Denies abdominal distension, abdominal pain, fever, nausea, vomiting. Pt is currently taking 81 mg Aspirin and Pletal.   Past Medical History:  Diagnosis Date  . Arthritis    "hands" (10/19/2013)  . Bladder tumor   . CAD (coronary artery disease)    cardiologist-  dr crouitoru  . Carotid artery disease (HCC)    moderate pRICA [id number],  123456 LICA , [id number] RECA last duplex 08-05-2015  . Cataract of both eyes   . Complete heart block ([place])   . GERD (gastroesophageal reflux disease)   . Glaucoma, both eyes   . Hematuria   . History of MI (myocardial infarction)    1989  . Hyperlipidemia   . Hypertension   . Ischemic cardiomyopathy    ef 40-45%  without clinical heart failure per cardiology note 12-28-2015 (nuclear stress ef 43% from 07-28-2015)  . Left ventricular dysfunction   . Non Hodgkin's lymphoma ([place]) dx 06/2010---  oncologist-  dr Benay Spice---  in clinical remission since 2013   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 -- lymphadenopathy to  neck, mediastinal , and abdominal--  chemotherapy completed 06/ 2011  . NSVT (nonsustained ventricular tachycardia) [facility]) cardiologist-  dr crouitoru   Max rate 200 bpm, 28-beat, nearly 9-second episode recorded by pacemaker on November 15, asymptomatic   . Pacemaker pacemaker dependant due to CHB   1st Pacemaker Placement 1998 w/ New generator  12-31-2000 ; 08-04-2008;  11-29-2015  . PAD (peripheral artery disease) ([place])    followed by dr berry--- last duplex 08-05-2015  patents bilateral popliteal stents, bilateral SFA 30-49% stenosis  . Peripheral vascular disease with claudication ([place])    dx 2009 left popliteal occlusion with collaterals; 03- 2015  s/p  stenting right popliteal  by dr berry and left popliteal stenting by dr Andree Elk ([facility]) july 2015  . S/P CABG x 5    09-10-1988  . S/P drug eluting coronary stent placement 01/04/2006   01-04-2006--- DES to SVG to OM1, OM2, and IR, and SVG to PDA(x2 grafts)  and 03-04-2013  DES to SVR to OM  . Wears glasses     Patient Active Problem List   Diagnosis Date Noted  . CHB (complete heart block) ([place]) 11/29/2015  . Pacemaker battery depletion 11/28/2015  . Pacemaker lead malfunction 11/28/2015  . Pacemaker 10/21/2015  . NSVT (nonsustained ventricular tachycardia) ([place]) 06/24/2014  . Pseudoaneurysm following procedure ([place]) 10/22/2013  . Claudication in peripheral vascular disease ([place]) 10/19/2013  .  Hematoma of groin -right sided; w/p PV angio 09/11/2013  . Claudication- unsuccessful PTA attempt Lt popliteal 08/31/13 08/31/2013  . Asymptomatic stenosis of right carotid artery without infarction 06/14/2013  . Unstable angina ([place]) 03/05/2013  . CAD s/p CABG 1995, s/p stent SVG-OM 2007 and July 2014 03/05/2013  . S/P angioplasty with stent SVG-OM 7/30./14 03/05/2013  . Complete heart block- [facility] pacemaker '95 with Gen change '02, '09. New leads 2017. Pacer dependednt 03/05/2013  . PVD - Lt popliteal occlusion with collaterals '09; rt  popliteal successful diamondback orbital rotational atherectomy PTA and stent using an IDEV stent 10/21/13 03/05/2013  . Non Hodgkin's lymphoma- Feb 2011- radiation Rx 03/05/2013  . Dyslipidemia 03/05/2013  . HTN (hypertension) 03/05/2013  . Cardiomyopathy, ischemic- EF 40-45% by echo 4/14 03/05/2013  . Abnormal nuclear cardiac imaging test- high risk Myoview 02/13/13 03/05/2013    Past Surgical History:  Procedure Laterality Date  . ANAL FISSURE REPAIR  12-09-2000 and 1970's  . CARDIAC CATHETERIZATION  04/09/2000   patent grafts  . CARDIAC PACEMAKER PLACEMENT  11-15-1993   dr Cyndia Bent  . CARDIOVASCULAR STRESS TEST  07-28-2015  dr crouitoru   Intermediate risk nuclear study w/ large, severe, predominantly fixed inferior and apical defect; minimal reversibility inferior lateral wall; findings consistent w/ large prior infarct and trivial peri-infarct ischemia;  ef 43% w/ inferior hypokinesis and apical akinesis; mild LVE  . CORONARY ANGIOPLASTY WITH STENT PLACEMENT  01-04-2006  dr gamble   DES to pSVG to OM1, SVG to OM2 &  IR, and mSVG to RCA dPDA (x2 grafts );  pLAD , pLCFX and  mRCA occluded;  LIMA to LAD mid-distal patent with occlusion beyond LIMA with collateral flow;  ef 30%  . CORONARY ARTERY BYPASS GRAFT  09/10/1988   dr Redmond Pulling   "CABG X5" (03/04/2013) LIMA to LAD,SVG to imtermediate OM1 OM II,SVG to PDA  . CYSTOSCOPY W/ RETROGRADES Bilateral 04/13/2016   Procedure: CYSTOSCOPY WITH RETROGRADE PYELOGRAM;  Surgeon: Alexis Frock, MD;  Location: [facility];  Service: Urology;  Laterality: Bilateral;  . EP IMPLANTABLE DEVICE N/A 11/29/2015   Procedure: Pacemaker Implant;  Surgeon: Sanda Klein, MD;  Location: [facility] CV LAB;  Service: Cardiovascular;  Laterality: N/A;  new generator and new atrial, vertricular leads;  Patent attorney  . LEFT HEART CATHETERIZATION WITH CORONARY/GRAFT ANGIOGRAM N/A 03/04/2013   Procedure: LEFT HEART CATHETERIZATION WITH Beatrix Fetters;  Surgeon: Troy Sine, MD;  Location: [facility] C[facility] LAB;  Service: Cardiovascular;  Laterality: N/A;  . LOWER EXTREMITY ANGIOGRAM N/A 08/31/2013   Procedure: LOWER EXTREMITY ANGIOGRAM;  Surgeon: Lorretta Harp, MD;  Location: [facility] CATH LAB;  Service: Cardiovascular;  Laterality: N/A;  . PACEMAKER REPLACEMENT  12/31/2000;  08-04-2008  . PERCUTANEOUS CORONARY STENT INTERVENTION (PCI-S)  03/04/2013   Procedure: PERCUTANEOUS CORONARY STENT INTERVENTION (PCI-S);  Surgeon: Troy Sine, MD;  Location: [facility] CATH LAB;  Service: Cardiovascular;;  DES to S-OM for in-stent restenosis  . POPLITEAL ARTERY STENT Right 10-21-2013  dr berry   atherectomy, PTA , and IDEV stent x1  . POPLITEAL ARTERY STENT Left 07/ 2015  dr [facility]ree [facility]k at [facility]   angioplasty / stent x2  . 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 SUPERFICIAL LYMPH NODE BIOPSY / EXCISION Left 2011   cervical lymph node  . TRANSURETHRAL RESECTION OF BLADDER TUMOR N/A 04/13/2016   Procedure: TRANSURETHRAL RESECTION OF BLADDER TUMOR (TURBT) with clot evacuation;  Surgeon: Alexis Frock, MD;  Location: [facility];  Service: Urology;  Laterality: N/A;  . VENOGRAM Left 11/29/2015   Procedure: Venogram;  Surgeon: Sanda Klein, MD;  Location: [facility] CV LAB;  Service: Cardiovascular;  Laterality: Left;       Home Medications    Prior to Admission medications   Medication Sig Start Date End Date Taking? Authorizing Provider  bimatoprost (LUMIGAN) 0.01 % SOLN Place 1 drop into both eyes at bedtime.    Yes Historical Provider, MD  cetirizine (ZYRTEC) 10 MG tablet Take 10 mg by mouth every evening.    Yes Historical Provider, MD  cilostazol (PLETAL) 100 MG tablet Take 100 mg by mouth 2 (two) times daily.  05/07/16  Yes Historical Provider, MD  CRESTOR 40 MG tablet Take 1 tablet by mouth at  bedtime 03/06/16  Yes Lorretta Harp, MD  Glucosamine-Chondroitin 750-600 MG TABS Take 1 tablet by mouth 2 (two)  times daily.   Yes Historical Provider, MD  lisinopril (PRINIVIL,ZESTRIL) 20 MG tablet Take 20 mg by mouth every morning.    Yes Historical Provider, MD  metoprolol (LOPRESSOR) 50 MG tablet Take 1 and 1/2 tablets by  mouth two times daily 12/28/15  Yes Mihai Croitoru, MD  Multiple Vitamins-Minerals (SENIOR MULTIVITAMIN PLUS PO) Take 1 tablet by mouth daily.   Yes Historical Provider, MD  omega-3 acid ethyl esters (LOVAZA) 1 g capsule Take 2 capsules by mouth  two times daily 01/16/16  Yes Mihai Croitoru, MD  pantoprazole (PROTONIX) 40 MG tablet Take 1 tablet by mouth  daily 01/16/16  Yes Mihai Croitoru, MD  cephALEXin (KEFLEX) 500 MG capsule Take 1 capsule (500 mg total) by mouth 4 (four) times daily. 05/08/16   Quintella Reichert, MD  nitroGLYCERIN (NITROSTAT) 0.4 MG SL tablet Place 1 tablet (0.4 mg total) under the tongue every 5 (five) minutes as needed for chest pain. 03/05/13   Erlene Quan, PA-C    Family History Family History  Problem Relation Age of Onset  . Diabetes Mother   . Stroke Mother   . Heart attack Father   . Hypertension Sister   . Hyperlipidemia Sister   . Hypertension Sister   . Hyperlipidemia Sister     Social History Social History  Substance Use Topics  . Smoking status: Former Smoker    Packs/day: 0.12    Years: 1.00    Types: Cigarettes    Quit date: 08/06/1956  . Smokeless tobacco: Never Used  . Alcohol use No     Allergies   Azithromycin; Lactose intolerance (gi); Niacin and related; and Sulfa antibiotics   Review of Systems Review of Systems  Constitutional: Negative for fever.  Gastrointestinal: Negative for abdominal distention, abdominal pain, nausea and vomiting.  Genitourinary: Positive for difficulty urinating, dysuria and hematuria.       + Urinary Retention  All other systems reviewed and are negative.  Physical Exam Updated Vital Signs BP 145/72 (BP Location: Right Arm)   Pulse 85   Temp 97.7 F (36.5 C) (Oral)   Resp 18   Ht 5\' 9"   (1.753 m)   Wt 205 lb (93 kg)   SpO2 98%   BMI 30.27 kg/m   Physical Exam  Constitutional: He is oriented to person, place, and time. He appears well-developed and well-nourished.  HENT:  Head: Normocephalic and atraumatic.  Cardiovascular: Normal rate and regular rhythm.   No murmur heard. Pulmonary/Chest: Effort normal and breath sounds normal. No respiratory distress.  Abdominal: Soft. There is no tenderness. There is  no rebound and no guarding.  Mild suprapubic tenderness  Genitourinary:  Genitourinary Comments: Foley catheter in place with bloody urine with no gross clots  Musculoskeletal: He exhibits no edema or tenderness.  Neurological: He is alert and oriented to person, place, and time.  Skin: Skin is warm and dry.  Psychiatric: He has a normal mood and affect. His behavior is normal.  Nursing note and vitals reviewed.  ED Treatments / Results  DIAGNOSTIC STUDIES: Oxygen Saturation is 96% on RA, adequate by my interpretation.    COORDINATION OF CARE: 11:09 PM Discussed treatment plan with pt at bedside which includes Urinalysis and pt agreed to plan.  Labs (all labs ordered are listed, but only abnormal results are displayed) Labs Reviewed  URINALYSIS, ROUTINE W REFLEX MICROSCOPIC (NOT AT [facility]) - Abnormal; Notable for the following:       Result Value   Color, Urine RED (*)    APPearance CLOUDY (*)    Hgb urine dipstick LARGE (*)    Protein, ur 100 (*)    Nitrite POSITIVE (*)    Leukocytes, UA LARGE (*)    All other components within normal limits  BASIC METABOLIC PANEL - Abnormal; Notable for the following:    Glucose, Bld 121 (*)    GFR calc non Af Amer 56 (*)    All other components within normal limits  CBC WITH DIFFERENTIAL/PLATELET - Abnormal; Notable for the following:    RBC 3.78 (*)    Hemoglobin 11.7 (*)    HCT 33.3 (*)    All other components within normal limits  URINE MICROSCOPIC-ADD ON - Abnormal; Notable for the following:    Bacteria, UA  MANY (*)    All other components within normal limits  URINE CULTURE    EKG  EKG Interpretation None      Radiology No results found.  Procedures Procedures (including critical care time)  Medications Ordered in ED Medications  lidocaine (XYLOCAINE) 2 % jelly 1 application (not administered)  cefTRIAXone (ROCEPHIN) injection 1 g (1 g Intramuscular Given 05/08/16 0111)  lidocaine (PF) (XYLOCAINE) 1 % injection (30 mLs  Given 05/08/16 0113)     Initial Impression / Assessment and Plan / ED Course  I have reviewed the triage vital signs and the nursing notes.  Pertinent labs & imaging results that were available during my care of the patient were reviewed by me and considered in my medical decision making (see chart for details).  Clinical Course    Patient with history of recent tumor resection here with gross hematuria. Foley catheter was placed in the emergency department with significant hematuria and no active clots. UA is concerning for urinary tract infection, we'll send cultures and treat with antibiotics. Labs are stable compared to priors. Given retention with gross hematuria and clots will leave Foley catheter in place with very close urology follow-up for catheter removal. Discussed home care, outpatient follow-up and close return precautions.  Final Clinical Impressions(s) / ED Diagnoses   Final diagnoses:  Acute UTI  Gross hematuria   I personally performed the services described in this documentation, which was scribed in my presence. The recorded information has been reviewed and is accurate.  New Prescriptions Discharge Medication List as of 05/08/2016 12:56 AM       Quintella Reichert, MD 05/08/16 [phone]

## 2016-05-07 NOTE — ED Triage Notes (Signed)
PT C/O URINARY RETENTION SINCE 4PM TODAY. PT STS HE HAD A TUMOR BLADDER REMOVED FROM HIS BLADDER ON 04/13/2016. PT STS HE HAD BEEN URINATING WITH SOME BLOOD SINCE THE PROCEDURE, BUT STOPPED HAVING A STEADY STREAM TODAY.

## 2016-05-08 DIAGNOSIS — R338 Other retention of urine: Secondary | ICD-10-CM | POA: Diagnosis not present

## 2016-05-08 DIAGNOSIS — N390 Urinary tract infection, site not specified: Secondary | ICD-10-CM | POA: Diagnosis not present

## 2016-05-08 DIAGNOSIS — R310 Gross hematuria: Secondary | ICD-10-CM | POA: Diagnosis not present

## 2016-05-08 LAB — BASIC METABOLIC PANEL
Anion gap: 8 (ref 5–15)
BUN: 20 mg/dL (ref 6–20)
CO2: 24 mmol/L (ref 22–32)
CREATININE: 1.18 mg/dL (ref 0.61–1.24)
Calcium: 9.2 mg/dL (ref 8.9–10.3)
Chloride: 105 mmol/L (ref 101–111)
GFR calc Af Amer: >60 mL/min (ref >60)
GFR calc non Af Amer: 56 mL/min — ABNORMAL LOW (ref >60)
GLUCOSE: 121 mg/dL — AB (ref 65–99)
Potassium: 4.4 mmol/L (ref 3.5–5.1)
Sodium: 137 mmol/L (ref 135–145)

## 2016-05-08 LAB — URINALYSIS, ROUTINE W REFLEX MICROSCOPIC
BILIRUBIN URINE: NEGATIVE
GLUCOSE, UA: NEGATIVE mg/dL
Ketones, ur: NEGATIVE mg/dL
Nitrite: POSITIVE — AB
PH: 6 (ref 5.0–8.0)
Protein, ur: 100 mg/dL — AB
SPECIFIC GRAVITY, URINE: 1.008 (ref 1.005–1.030)

## 2016-05-08 LAB — URINE MICROSCOPIC-ADD ON: Squamous Epithelial / LPF: NONE SEEN

## 2016-05-08 MED ORDER — CEPHALEXIN 500 MG PO CAPS: 500.0000 mg | ORAL_CAPSULE | Freq: Four times a day (QID) | ORAL | 0 refills | Status: DC

## 2016-05-08 MED ORDER — CEFTRIAXONE SODIUM 1 G IJ SOLR
1.0000 g | Freq: Once | INTRAMUSCULAR | Status: AC
  Administered 2016-05-08: 1 g via INTRAMUSCULAR
  Filled 2016-05-08: qty 10

## 2016-05-08 MED ORDER — LIDOCAINE HCL (PF) 1 % IJ SOLN
INTRAMUSCULAR | Status: AC
  Administered 2016-05-08: 30 mL
  Filled 2016-05-08: qty 30

## 2016-05-08 NOTE — ED Notes (Signed)
Pt ambulatory and independent at discharge.  Verbalized understanding of discharge instructions 

## 2016-05-11 ENCOUNTER — Telehealth (HOSPITAL_BASED_OUTPATIENT_CLINIC_OR_DEPARTMENT_OTHER): Payer: Self-pay

## 2016-05-11 LAB — URINE CULTURE: Culture: >=100000 — AB

## 2016-05-11 NOTE — Telephone Encounter (Signed)
Post ED Visit - Positive Culture Follow-up: Successful Patient Follow-Up  Culture assessed and recommendations reviewed by: []  Elenor Quinones, Pharm.D. []  Heide Guile, Pharm.D., BCPS []  Parks Neptune, Pharm.D. []  Alycia Rossetti, Pharm.D., BCPS []  [place], [place].D., BCPS, AAHIVP []  Legrand Como, Pharm.D., BCPS, AAHIVP []  Milus Glazier, Pharm.D. []  Stephens November, [place].D. Renee Ackley Pharm D Positive urine culture  []  Patient discharged without antimicrobial prescription and treatment is now indicated [x]  Organism is resistant to prescribed ED discharge antimicrobial []  Patient with positive blood cultures  Changes discussed with ED provider: P[place] PAC New antibiotic prescription Amoxicillin 500 mg BID x 7 days Called to [facility]  [id number]  Contacted patient, date 05/11/16, time 1022   David [name], [place] Comment 05/11/2016, 10:18 AM

## 2016-05-11 NOTE — Progress Notes (Signed)
ED Antimicrobial Stewardship Positive Culture Follow Up   David [name] is an 81 y.o. male who presented to [facility] on 05/07/2016 with a chief complaint of  Chief Complaint  Patient presents with  . Urinary Retention    SINCE 4PM    Recent Results (from the past 720 hour(s))  Urine culture     Status: Abnormal   Collection Time: 05/07/16 11:45 PM  Result Value Ref Range Status   Specimen Description URINE, RANDOM  Final   Special Requests NONE  Final   Culture >=100,000 COLONIES/mL ENTEROCOCCUS FAECALIS (A)  Final   Report Status 05/11/2016 FINAL  Final   Organism ID, Bacteria ENTEROCOCCUS FAECALIS (A)  Final      Susceptibility   Enterococcus faecalis - MIC*    AMPICILLIN <=2 SENSITIVE Sensitive     LEVOFLOXACIN 1 SENSITIVE Sensitive     NITROFURANTOIN <=16 SENSITIVE Sensitive     VANCOMYCIN 1 SENSITIVE Sensitive     * >=100,000 COLONIES/mL ENTEROCOCCUS FAECALIS    [x]  Treated with cephalexin, organism resistant to prescribed antimicrobial  New antibiotic prescription: Stop Keflex. Start amoxicillin 500 mg PO Q12H for 7 days  ED Provider: Monico Blitz, PA-C  Gwenlyn Perking, PharmD PGY1 Pharmacy Resident Pager: [phone] 05/11/2016 9:13 AM

## 2016-05-15 DIAGNOSIS — R338 Other retention of urine: Secondary | ICD-10-CM | POA: Diagnosis not present

## 2016-05-17 ENCOUNTER — Other Ambulatory Visit (HOSPITAL_BASED_OUTPATIENT_CLINIC_OR_DEPARTMENT_OTHER): Payer: Medicare Other

## 2016-05-17 ENCOUNTER — Telehealth: Payer: Self-pay | Admitting: Oncology

## 2016-05-17 ENCOUNTER — Ambulatory Visit (HOSPITAL_BASED_OUTPATIENT_CLINIC_OR_DEPARTMENT_OTHER): Payer: Medicare Other | Admitting: Oncology

## 2016-05-17 VITALS — BP 141/64 | HR 64 | Temp 98.2°F | Resp 18 | Ht 69.0 in | Wt 205.7 lb

## 2016-05-17 DIAGNOSIS — C8208 Follicular lymphoma grade I, lymph nodes of multiple sites: Secondary | ICD-10-CM

## 2016-05-17 DIAGNOSIS — Z8572 Personal history of non-Hodgkin lymphomas: Secondary | ICD-10-CM

## 2016-05-17 DIAGNOSIS — Z8551 Personal history of malignant neoplasm of bladder: Secondary | ICD-10-CM | POA: Diagnosis not present

## 2016-05-17 LAB — CBC WITH DIFFERENTIAL/PLATELET
BASO%: 0.4 % (ref 0.0–2.0)
BASOS ABS: 0 10*3/uL (ref 0.0–0.1)
EOS ABS: 0.3 10*3/uL (ref 0.0–0.5)
EOS%: 6.5 % (ref 0.0–7.0)
HEMATOCRIT: 37.5 % — AB (ref 38.4–49.9)
HEMOGLOBIN: 12.8 g/dL — AB (ref 13.0–17.1)
LYMPH#: 0.9 10*3/uL (ref 0.9–3.3)
LYMPH%: 17.9 % (ref 14.0–49.0)
MCH: 30.8 pg (ref 27.2–33.4)
MCHC: 34.1 g/dL (ref 32.0–36.0)
MCV: 90.1 fL (ref 79.3–98.0)
MONO#: 0.5 10*3/uL (ref 0.1–0.9)
MONO%: 10.1 % (ref 0.0–14.0)
NEUT#: 3.1 10*3/uL (ref 1.5–6.5)
NEUT%: 65.1 % (ref 39.0–75.0)
NRBC: 0 % (ref 0–0)
PLATELETS: 143 10*3/uL (ref 140–400)
RBC: 4.16 10*6/uL — ABNORMAL LOW (ref 4.20–5.82)
RDW: 13.9 % (ref 11.0–14.6)
WBC: 4.7 10*3/uL (ref 4.0–10.3)

## 2016-05-17 NOTE — Progress Notes (Signed)
  David [name]   Diagnosis: Non-Hodgkin's lymphoma  INTERVAL HISTORY:   David [name] returns as scheduled. He had hematuria and reports being diagnosed with noninvasive bladder cancer by Dr. Tresa Moore. He underwent transurethral resection of a bladder tumor on 04/13/2016. He received intravesical mitomycin-C. He reports developing clots requiring exchange of a Foley catheter. The Foley catheter was removed earlier this week. He is now voiding without difficulty. No fever, night sweats, or palpable lymph nodes. Good appetite.       Objective:  Vital signs in last 24 hours:  Blood pressure (!) 141/64, pulse 64, temperature 98.2 F (36.8 C), resp. rate 18, height 5\' 9"  (1.753 m), weight 205 lb 11.2 oz (93.3 kg), SpO2 99 %.    HEENT:  neck without mass Lymphatics:  no cervical, supraclavicular, axillary, or inguinal nodes Resp:  lungs clear bilaterally Cardio:  regular rate and rhythm GI:  no hepatosplenomegaly, no mass, nontender Vascular:  no leg edema   Lab Results:  Lab Results  Component Value Date   WBC 4.7 05/17/2016   HGB 12.8 (L) 05/17/2016   HCT 37.5 (L) 05/17/2016   MCV 90.1 05/17/2016   PLT 143 05/17/2016   NEUTROABS 3.1 05/17/2016     Medications: I have reviewed the patient's current medications.  Assessment/Plan: 1. Non-Hodgkin's lymphoma, follicular B-cell, low grade, involving a left cervical lymph node with a staging evaluation revealing evidence for advanced stage disease. There was cervical, mediastinal, and abdominal lymphadenopathy and a question of splenic involvement on staging scans. He completed 4 cycles of fludarabine/rituximab with resolution of the palpable lymphadenopathy. The last chemotherapy was given in June 2011.  2. Skin rash following cycle #1 of fludarabine/rituximab, likely related to allopurinol or Bactrim. The Bactrim prophylaxis was discontinued. 3. History of Chronic Thrombocytopenia secondary  chemotherapy and non-Hodgkin's lymphoma 4. History of neutropenia secondary to chemotherapy. 5. Injection site reaction to a pneumococcal vaccine in October 2011. 6. History of coronary artery disease and peripheral vascular disease, followed by cardiology 7. papillary urothelial carcinoma, status post transurethral resection and intravesical mitomycin-C 04/13/2016     Disposition:   David [name] remains in clinical remission from non-Hodgkin's lymphoma. He would like to continue follow-up at the [facility]. He will return for an office visit in 9 months. He is followed by Dr. Tresa Moore for management of the urothelial carcinoma.  Betsy Coder, MD  05/17/2016  11:03 AM

## 2016-05-17 NOTE — Telephone Encounter (Signed)
Gv pt appt for 02/14/17.

## 2016-06-12 ENCOUNTER — Telehealth: Payer: Self-pay | Admitting: Cardiovascular Disease

## 2016-06-12 DIAGNOSIS — I739 Peripheral vascular disease, unspecified: Secondary | ICD-10-CM

## 2016-06-12 NOTE — Telephone Encounter (Signed)
Pt would like to know when he is suppose to schedule an appt with Dr.Berry being that he is the one who orders all his test.

## 2016-06-12 NOTE — Addendum Note (Signed)
Addended by: Theodore Demark on: 06/12/2016 05:35 PM   Modules accepted: Orders

## 2016-06-12 NOTE — Telephone Encounter (Signed)
I have left patient a message that these orders are in system & that Dr. Gwenlyn Found would see him post imaging if results abnormal or if symptoms. Instructed if he wishes to call back and discuss to do so, o/w expect call from scheduler to set up test.

## 2016-06-12 NOTE — Telephone Encounter (Signed)
Okay to have follow-up lower extremity are chilled Doppler studies in December which will be a year from his previous Dopplers. Follow-up with me when necessary if he has worsening Dopplers and/or recurrent symptoms.

## 2016-06-12 NOTE — Telephone Encounter (Signed)
Reviewed chart. He is a patient of Dr. [name]. Sees Dr. Gwenlyn Found for PV. His last lower arterial dopplers were done 08/2014 w/ recommended f/u of 1 year. Routed to Dr. Gwenlyn Found to confirm OK to order.  Does he need a f/u OV? Previous instructions were to follow up w/ Dr. Gwenlyn Found as needed.  I have left pt a message informing him I've relayed inquiry to Dr. Gwenlyn Found, I will follow up w patient, and to call back if he has further needs in interim.

## 2016-07-03 ENCOUNTER — Ambulatory Visit (INDEPENDENT_AMBULATORY_CARE_PROVIDER_SITE_OTHER): Payer: Medicare Other | Admitting: *Deleted

## 2016-07-03 ENCOUNTER — Other Ambulatory Visit: Payer: Self-pay | Admitting: Cardiovascular Disease

## 2016-07-03 DIAGNOSIS — H0015 Chalazion left lower eyelid: Secondary | ICD-10-CM | POA: Diagnosis not present

## 2016-07-03 DIAGNOSIS — I442 Atrioventricular block, complete: Secondary | ICD-10-CM

## 2016-07-03 DIAGNOSIS — H2513 Age-related nuclear cataract, bilateral: Secondary | ICD-10-CM | POA: Diagnosis not present

## 2016-07-03 DIAGNOSIS — I739 Peripheral vascular disease, unspecified: Secondary | ICD-10-CM

## 2016-07-04 NOTE — Progress Notes (Signed)
Remote pacemaker transmission.   

## 2016-07-05 ENCOUNTER — Encounter: Payer: Self-pay | Admitting: Cardiology

## 2016-07-11 ENCOUNTER — Ambulatory Visit (HOSPITAL_COMMUNITY)
Admission: RE | Admit: 2016-07-11 | Discharge: 2016-07-11 | Disposition: A | Discharge status: Home / Self Care | Payer: Medicare Other | Source: Ambulatory Visit | Attending: Cardiovascular Disease | Admitting: Cardiovascular Disease

## 2016-07-11 ENCOUNTER — Other Ambulatory Visit: Payer: Self-pay | Admitting: Cardiovascular Disease

## 2016-07-11 DIAGNOSIS — E785 Hyperlipidemia, unspecified: Secondary | ICD-10-CM | POA: Diagnosis not present

## 2016-07-11 DIAGNOSIS — I10 Essential (primary) hypertension: Secondary | ICD-10-CM | POA: Diagnosis not present

## 2016-07-11 DIAGNOSIS — Z87891 Personal history of nicotine dependence: Secondary | ICD-10-CM | POA: Insufficient documentation

## 2016-07-11 DIAGNOSIS — I2510 Atherosclerotic heart disease of native coronary artery without angina pectoris: Secondary | ICD-10-CM | POA: Insufficient documentation

## 2016-07-11 DIAGNOSIS — I739 Peripheral vascular disease, unspecified: Secondary | ICD-10-CM | POA: Diagnosis not present

## 2016-07-11 DIAGNOSIS — Z951 Presence of aortocoronary bypass graft: Secondary | ICD-10-CM | POA: Insufficient documentation

## 2016-07-16 ENCOUNTER — Telehealth: Payer: Self-pay | Admitting: Cardiovascular Disease

## 2016-07-16 DIAGNOSIS — R310 Gross hematuria: Secondary | ICD-10-CM | POA: Diagnosis not present

## 2016-07-16 DIAGNOSIS — C672 Malignant neoplasm of lateral wall of bladder: Secondary | ICD-10-CM | POA: Diagnosis not present

## 2016-07-16 NOTE — Telephone Encounter (Signed)
Did not need encounter °

## 2016-07-18 ENCOUNTER — Other Ambulatory Visit: Payer: Self-pay | Admitting: Cardiovascular Disease

## 2016-07-18 DIAGNOSIS — I739 Peripheral vascular disease, unspecified: Secondary | ICD-10-CM

## 2016-07-26 ENCOUNTER — Telehealth: Payer: Self-pay | Admitting: Cardiovascular Disease

## 2016-07-26 DIAGNOSIS — I6521 Occlusion and stenosis of right carotid artery: Secondary | ICD-10-CM

## 2016-07-26 DIAGNOSIS — M79604 Pain in right leg: Secondary | ICD-10-CM | POA: Diagnosis not present

## 2016-07-26 NOTE — Telephone Encounter (Signed)
New message    12/21 pt verbalized that he is needing a carotid

## 2016-07-26 NOTE — Telephone Encounter (Signed)
Spoke with pt pt was wondering when he should be schedule for his "annual carotid study"?  Per last result(2015): Notes Recorded by Sanda Klein, MD on 02/23/2014 at 11:25 AM Very slight worsening in right carotid (still around 50% narrowing). Please repeat in 12 months.-ordered to be scheduled.  Pt notified he will await call to schedule

## 2016-07-31 ENCOUNTER — Encounter (HOSPITAL_COMMUNITY): Payer: Medicare Other

## 2016-08-01 ENCOUNTER — Other Ambulatory Visit: Payer: Self-pay | Admitting: *Deleted

## 2016-08-01 MED ORDER — NITROGLYCERIN 0.4 MG SL SUBL: 0.4000 mg | SUBLINGUAL_TABLET | SUBLINGUAL | 2 refills | Status: DC | PRN

## 2016-08-02 DIAGNOSIS — D2311 Other benign neoplasm of skin of right eyelid, including canthus: Secondary | ICD-10-CM | POA: Diagnosis not present

## 2016-08-02 DIAGNOSIS — H40053 Ocular hypertension, bilateral: Secondary | ICD-10-CM | POA: Diagnosis not present

## 2016-08-02 DIAGNOSIS — H43811 Vitreous degeneration, right eye: Secondary | ICD-10-CM | POA: Diagnosis not present

## 2016-08-02 DIAGNOSIS — H25813 Combined forms of age-related cataract, bilateral: Secondary | ICD-10-CM | POA: Diagnosis not present

## 2016-08-03 ENCOUNTER — Telehealth: Payer: Self-pay | Admitting: Cardiovascular Disease

## 2016-08-03 NOTE — Telephone Encounter (Signed)
Pt is having Cataract surgery on 08-08-15. Can his stop his Aspirin today?

## 2016-08-03 NOTE — Telephone Encounter (Signed)
OK to stop aspirin for 5 days. MCr

## 2016-08-03 NOTE — Telephone Encounter (Signed)
Returned call to patient-patient states he is having cataract surgery on 08/08/15 and was told by opthalmology he should hold his ASA for 5 days but it was up to the cardiologist.  Surgeon: Arty Baumgartner I would route to MD Croitoru for review.

## 2016-08-03 NOTE — Telephone Encounter (Signed)
Patient made aware ok to hold ASA for 5 days per Dr. [name].  Verbalized understanding.

## 2016-08-07 ENCOUNTER — Encounter (HOSPITAL_COMMUNITY): Payer: Medicare Other

## 2016-08-07 DIAGNOSIS — H268 Other specified cataract: Secondary | ICD-10-CM | POA: Diagnosis not present

## 2016-08-07 DIAGNOSIS — H25812 Combined forms of age-related cataract, left eye: Secondary | ICD-10-CM | POA: Diagnosis not present

## 2016-08-07 DIAGNOSIS — H52202 Unspecified astigmatism, left eye: Secondary | ICD-10-CM | POA: Diagnosis not present

## 2016-08-10 ENCOUNTER — Ambulatory Visit (HOSPITAL_COMMUNITY)
Admission: RE | Admit: 2016-08-10 | Discharge: 2016-08-10 | Disposition: A | Discharge status: Home / Self Care | Payer: Medicare Other | Source: Ambulatory Visit | Attending: Internal Medicine | Admitting: Internal Medicine

## 2016-08-10 DIAGNOSIS — I2510 Atherosclerotic heart disease of native coronary artery without angina pectoris: Secondary | ICD-10-CM | POA: Insufficient documentation

## 2016-08-10 DIAGNOSIS — Z87891 Personal history of nicotine dependence: Secondary | ICD-10-CM | POA: Insufficient documentation

## 2016-08-10 DIAGNOSIS — I6523 Occlusion and stenosis of bilateral carotid arteries: Secondary | ICD-10-CM | POA: Insufficient documentation

## 2016-08-10 DIAGNOSIS — I10 Essential (primary) hypertension: Secondary | ICD-10-CM | POA: Insufficient documentation

## 2016-08-10 DIAGNOSIS — I6521 Occlusion and stenosis of right carotid artery: Secondary | ICD-10-CM

## 2016-08-10 DIAGNOSIS — I739 Peripheral vascular disease, unspecified: Secondary | ICD-10-CM | POA: Diagnosis not present

## 2016-08-10 DIAGNOSIS — E785 Hyperlipidemia, unspecified: Secondary | ICD-10-CM | POA: Diagnosis not present

## 2016-08-16 LAB — CUP PACEART REMOTE DEVICE CHECK
Date Time Interrogation Session: 20171128081300
Implantable Lead Implant Date: 20170425
Implantable Lead Location: 753860
Implantable Lead Model: 7741
Implantable Lead Model: 7742
Implantable Lead Serial Number: 720495
Implantable Lead Serial Number: 763888
Implantable Pulse Generator Implant Date: 20170425
Lead Channel Impedance Value: 665 Ohm
Lead Channel Impedance Value: 849 Ohm
Lead Channel Pacing Threshold Pulse Width: 0.4 ms
MDC IDC LEAD IMPLANT DT: 20170425
MDC IDC LEAD LOCATION: 753859
MDC IDC MSMT BATTERY REMAINING LONGEVITY: 150 mo
MDC IDC MSMT BATTERY REMAINING PERCENTAGE: 100 %
MDC IDC MSMT LEADCHNL RV PACING THRESHOLD AMPLITUDE: 0.8 V
MDC IDC SET LEADCHNL RA PACING AMPLITUDE: 3.5 V
MDC IDC SET LEADCHNL RV PACING AMPLITUDE: 1.3 V
MDC IDC SET LEADCHNL RV PACING PULSEWIDTH: 0.4 ms
MDC IDC SET LEADCHNL RV SENSING SENSITIVITY: 2.5 mV
MDC IDC STAT BRADY RA PERCENT PACED: 60 %
MDC IDC STAT BRADY RV PERCENT PACED: 98 %
Pulse Gen Serial Number: 718025

## 2016-08-20 ENCOUNTER — Other Ambulatory Visit: Payer: Self-pay | Admitting: Cardiovascular Disease

## 2016-08-21 DIAGNOSIS — H52201 Unspecified astigmatism, right eye: Secondary | ICD-10-CM | POA: Diagnosis not present

## 2016-08-21 DIAGNOSIS — H25811 Combined forms of age-related cataract, right eye: Secondary | ICD-10-CM | POA: Diagnosis not present

## 2016-09-03 ENCOUNTER — Telehealth: Payer: Self-pay | Admitting: Cardiovascular Disease

## 2016-09-03 MED ORDER — ROSUVASTATIN CALCIUM 40 MG PO TABS: 40.0000 mg | ORAL_TABLET | Freq: Every day | ORAL | 2 refills | Status: DC

## 2016-09-03 NOTE — Telephone Encounter (Signed)
[facility] told him to call and let Dr Gwenlyn Found know that he is okay with taking the generic for Crestor.

## 2016-09-03 NOTE — Telephone Encounter (Signed)
Patient need  Generic prescription for crestor sent to mail order  e-sent  prescription 90 day supply with 2 refills Patient aware.

## 2016-09-10 ENCOUNTER — Telehealth: Payer: Self-pay | Admitting: Cardiovascular Disease

## 2016-09-10 MED ORDER — CILOSTAZOL 100 MG PO TABS
100.0000 mg | ORAL_TABLET | Freq: Two times a day (BID) | ORAL | 0 refills | Status: DC
Start: 2016-09-10 — End: 2016-10-31

## 2016-09-10 NOTE — Telephone Encounter (Signed)
Pt out of town and for his Cilostazol 100 mg BID-needs 3 days worth-6 pills called into [facility] in [place] [phone]

## 2016-09-10 NOTE — Telephone Encounter (Signed)
Spoke to patient, advised refill sent to pharmacy, aware to call if new requests or concerns.

## 2016-10-10 ENCOUNTER — Ambulatory Visit (INDEPENDENT_AMBULATORY_CARE_PROVIDER_SITE_OTHER): Payer: Medicare Other | Admitting: *Deleted

## 2016-10-10 DIAGNOSIS — I442 Atrioventricular block, complete: Secondary | ICD-10-CM | POA: Diagnosis not present

## 2016-10-10 NOTE — Progress Notes (Signed)
Remote pacemaker transmission.   

## 2016-10-11 ENCOUNTER — Encounter: Payer: Self-pay | Admitting: Cardiology

## 2016-10-12 LAB — CUP PACEART REMOTE DEVICE CHECK
Date Time Interrogation Session: 20180302023500
Implantable Lead Implant Date: 20170425
Implantable Lead Location: 753860
Implantable Lead Model: 7741
Implantable Lead Serial Number: 720495
Lead Channel Impedance Value: 834 Ohm
Lead Channel Pacing Threshold Amplitude: 0.9 V
Lead Channel Pacing Threshold Pulse Width: 0.4 ms
MDC IDC LEAD IMPLANT DT: 20170425
MDC IDC LEAD LOCATION: 753859
MDC IDC LEAD SERIAL: 763888
MDC IDC MSMT BATTERY REMAINING LONGEVITY: 144 mo
MDC IDC MSMT BATTERY REMAINING PERCENTAGE: 100 %
MDC IDC MSMT LEADCHNL RA IMPEDANCE VALUE: 634 Ohm
MDC IDC PG IMPLANT DT: 20170425
MDC IDC SET LEADCHNL RA PACING AMPLITUDE: 3.5 V
MDC IDC SET LEADCHNL RV PACING AMPLITUDE: 1.4 V
MDC IDC SET LEADCHNL RV PACING PULSEWIDTH: 0.4 ms
MDC IDC SET LEADCHNL RV SENSING SENSITIVITY: 2.5 mV
MDC IDC STAT BRADY RA PERCENT PACED: 59 %
MDC IDC STAT BRADY RV PERCENT PACED: 98 %
Pulse Gen Serial Number: 718025

## 2016-10-19 DIAGNOSIS — H0015 Chalazion left lower eyelid: Secondary | ICD-10-CM | POA: Diagnosis not present

## 2016-10-29 DIAGNOSIS — M79604 Pain in right leg: Secondary | ICD-10-CM | POA: Diagnosis not present

## 2016-10-29 DIAGNOSIS — L609 Nail disorder, unspecified: Secondary | ICD-10-CM | POA: Diagnosis not present

## 2016-10-30 DIAGNOSIS — Z8551 Personal history of malignant neoplasm of bladder: Secondary | ICD-10-CM | POA: Diagnosis not present

## 2016-10-31 ENCOUNTER — Other Ambulatory Visit: Payer: Self-pay | Admitting: Cardiovascular Disease

## 2016-10-31 NOTE — Telephone Encounter (Signed)
REFILL 

## 2016-11-12 ENCOUNTER — Other Ambulatory Visit: Payer: Self-pay | Admitting: Cardiovascular Disease

## 2016-11-13 NOTE — Telephone Encounter (Signed)
KEEP OV.

## 2016-11-16 DIAGNOSIS — H0015 Chalazion left lower eyelid: Secondary | ICD-10-CM | POA: Diagnosis not present

## 2016-11-29 DIAGNOSIS — H00015 Hordeolum externum left lower eyelid: Secondary | ICD-10-CM | POA: Diagnosis not present

## 2016-11-29 DIAGNOSIS — H40053 Ocular hypertension, bilateral: Secondary | ICD-10-CM | POA: Diagnosis not present

## 2016-12-24 NOTE — Progress Notes (Signed)
Patient ID: David [name], male   DOB: 07-24-1933, 83 y.o.   MRN: [id number] Patient ID: David [name], male   DOB: 09-30-1932, 83 y.o.   MRN: [id number]    Cardiology Office Note    Date:  12/25/2016   ID:  David [name], DOB 05-11-1933, MRN [id number]  PCP:  Mayra Neer, MD  Cardiologist:  Quay Burow, MD (PAD);  Sanda Klein, MD   Chief Complaint  Patient presents with  . Follow-up    pt c/o DOE    History of Present Illness:  David [name] is a 83 y.o. male David [name] is a 83 y.o. male who presents for follow-up on his pacemaker, Roughly one year following implantation of a new generator and new atrial and ventricular leads . He is pacemaker dependent due to complete heart block. He also has coronary artery disease, PAD of the lower extremities, symptomatic PVCs (and one episode of lengthy nonsustained VT detected by his pacemaker).  Concepcion model number I9618080, serial number [id number] Right atrial lead (new) [employer] MRI, model number 2542, serial number [id number] Right ventricular lead (new) [employer] MRI, model number [id number], serial number [id number]   The patient specifically denies any chest pain at rest or with exertion, dyspnea at rest or with exertion, orthopnea, paroxysmal nocturnal dyspnea, syncope, palpitations, focal neurological deficits, lower extremity edema, unexplained weight gain, cough, hemoptysis or wheezing. He has Fontaine grade 2a intermittent claudication, lifestyle limiting. He does have bilateral hip pain that is worse when he gets up in the morning and actually improves with walking, not consistent with claudication.  He has undergone cystoscopic resection of a bladder tumor and bilateral cataract surgery since I last saw him)  Pacemaker interrogation shows 11.5 years f estimated generator longevity. He has 98% ventricular pacing (rest 2% PVCs) and has 50% atrial pacing.he has had infrequent and brief  episodes of paroxysmal atrial tachycardia lasting up to 13 seconds in duration. He has only had one brief episode of nonsustained ventricular tachycardia in the last 3 months  His last appointment with Dr. Alphonzo Lemmings December 2016.  David [name] has had numerous coronary revascularization procedures, most recently a stent in the saphenous vein graft to the oblique marginal artery in July 2014. He is now almost 20 years status post coronary bypass surgery. The same bypass graft had received a stent in 2007. He has mild ischemic cardiomyopathy with an ejection fraction of 40-45%, without clinical  heart failure. The nuclear stress test on July 28, 2015 shows old scar in the apex and inferior wall. LVEF is calculated at 43% (identical to 2014 and 2015).  He had occlusion of the left popliteal artery with distal reconstitution via collaterals. He had successful recanalization with placement of 2 stents by Dr. Andree Elk in 2015 . This led to marked improvement in his claudication He is pacemaker dependent secondary to complete heart block and his dual chamber Actor (gen change 2009) is likely to reach ERI in the next 18 months. Note that both his atrial and ventricular leads were placed in 1995 and are unipolar. (Atrial model 438-05, ventricular model 430-07). Interrogation of his device today again shows some issues with "noise" on the atrial channel but none on the ventricular channel. Overall device function is normal and he has virtually 100% atrial pacing as well as 100% ventricular pacing.  Moderate carotid artery disease (stable 60-79% proximal internal carotid artery stenosis on the right, mild  plaque on the left), last duplex US was February 11, 2015.  He has a history of non-Hodgkin's lymphoma with a large neck mass but also mediastinal and abdominal lymphadenopathy treated with radiotherapy to the cervical area in 2011 followed by chemotherapy with Rituxan and fludarabine. He is  considered to be in remission.     Past Medical History:  Diagnosis Date  . Arthritis    "hands" (10/19/2013)  . Bladder tumor   . CAD (coronary artery disease)    cardiologist-  dr crouitoru  . Carotid artery disease (HCC)    moderate pRICA 53-61%,  4-43% LICA , >15% RECA last duplex 08-05-2015  . Cataract of both eyes   . Complete heart block ([place])   . GERD (gastroesophageal reflux disease)   . Glaucoma, both eyes   . Hematuria   . History of MI (myocardial infarction)    1989  . Hyperlipidemia   . Hypertension   . Ischemic cardiomyopathy    ef 40-45%  without clinical heart failure per cardiology note 12-28-2015 (nuclear stress ef 43% from 07-28-2015)  . Left ventricular dysfunction   . Non Hodgkin's lymphoma ([place]) dx 06/2010---  oncologist-  dr Benay Spice---  in clinical remission since 2013   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 -- lymphadenopathy to neck, mediastinal , and abdominal--  chemotherapy completed 06/ 2011  . NSVT (nonsustained ventricular tachycardia) [facility]) cardiologist-  dr crouitoru   Max rate 200 bpm, 28-beat, nearly 9-second episode recorded by pacemaker on November 15, asymptomatic   . Pacemaker pacemaker dependant due to CHB   1st Pacemaker Placement 1998 w/ New generator  12-31-2000 ; 08-04-2008;  11-29-2015  . PAD (peripheral artery disease) ([employer])    followed by dr berry--- last duplex 08-05-2015  patents bilateral popliteal stents, bilateral SFA 30-49% stenosis  . Peripheral vascular disease with claudication ([place])    dx 2009 left popliteal occlusion with collaterals; 03- 2015  s/p  stenting right popliteal  by dr berry and left popliteal stenting by dr Andree Elk ([facility]) july 2015  . S/P CABG x 5    09-10-1988  . S/P drug eluting coronary stent placement 01/04/2006   01-04-2006--- DES to SVG to OM1, OM2, and IR, and SVG to PDA(x2 grafts)  and 03-04-2013  DES to SVR to OM  . Wears glasses     Past Surgical History:    Procedure Laterality Date  . ANAL FISSURE REPAIR  12-09-2000 and 1970's  . CARDIAC CATHETERIZATION  04/09/2000   patent grafts  . CARDIAC PACEMAKER PLACEMENT  11-15-1993   dr Cyndia Bent  . CARDIOVASCULAR STRESS TEST  07-28-2015  dr crouitoru   Intermediate risk nuclear study w/ large, severe, predominantly fixed inferior and apical defect; minimal reversibility inferior lateral wall; findings consistent w/ large prior infarct and trivial peri-infarct ischemia;  ef 43% w/ inferior hypokinesis and apical akinesis; mild LVE  . CORONARY ANGIOPLASTY WITH STENT PLACEMENT  01-04-2006  dr gamble   DES to pSVG to OM1, SVG to OM2 &  IR, and mSVG to RCA dPDA (x2 grafts );  pLAD , pLCFX and  mRCA occluded;  LIMA to LAD mid-distal patent with occlusion beyond LIMA with collateral flow;  ef 30%  . CORONARY ARTERY BYPASS GRAFT  09/10/1988   dr Redmond Pulling   "CABG X5" (03/04/2013) LIMA to LAD,SVG to imtermediate OM1 OM II,SVG to PDA  . CYSTOSCOPY W/ RETROGRADES Bilateral 04/13/2016   Procedure: CYSTOSCOPY WITH RETROGRADE PYELOGRAM;  Surgeon: Alexis Frock, MD;  Location: [facility];  Service: Urology;  Laterality: Bilateral;  . EP IMPLANTABLE DEVICE N/A 11/29/2015   Procedure: Pacemaker Implant;  Surgeon: Sanda Klein, MD;  Location: [facility];  Service: Cardiovascular;  Laterality: N/A;  new generator and new atrial, vertricular leads;  Patent attorney  . LEFT HEART CATHETERIZATION WITH CORONARY/GRAFT ANGIOGRAM N/A 03/04/2013   Procedure: LEFT HEART CATHETERIZATION WITH Beatrix Fetters;  Surgeon: Troy Sine, MD;  Location: [facility] CATH LAB;  Service: Cardiovascular;  Laterality: N/A;  . LOWER EXTREMITY ANGIOGRAM N/A 08/31/2013   Procedure: LOWER EXTREMITY ANGIOGRAM;  Surgeon: Lorretta Harp, MD;  Location: [facility] CATH LAB;  Service: Cardiovascular;  Laterality: N/A;  . PACEMAKER REPLACEMENT  12/31/2000;  08-04-2008  . PERCUTANEOUS CORONARY STENT INTERVENTION (PCI-S)  03/04/2013    Procedure: PERCUTANEOUS CORONARY STENT INTERVENTION (PCI-S);  Surgeon: Troy Sine, MD;  Location: [facility] CATH LAB;  Service: Cardiovascular;;  DES to S-OM for in-stent restenosis  . POPLITEAL ARTERY STENT Right 10-21-2013  dr berry   atherectomy, PTA , and IDEV stent x1  . POPLITEAL ARTERY STENT Left 07/ 2015  dr Andree Elk at [facility]   angioplasty / stent x2  . 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 SUPERFICIAL LYMPH NODE BIOPSY / EXCISION Left 2011   cervical lymph node  . TRANSURETHRAL RESECTION OF BLADDER TUMOR N/A 04/13/2016   Procedure: TRANSURETHRAL RESECTION OF BLADDER TUMOR (TURBT) with clot evacuation;  Surgeon: Alexis Frock, MD;  Location: [facility];  Service: Urology;  Laterality: N/A;  . VENOGRAM Left 11/29/2015   Procedure: Venogram;  Surgeon: Sanda Klein, MD;  Location: [place] [facility];  Service: Cardiovascular;  Laterality: Left;    Outpatient Medications Prior to Visit  Medication Sig Dispense Refill  . cetirizine (ZYRTEC) 10 MG tablet Take 10 mg by mouth every evening.     . cilostazol (PLETAL) 100 MG tablet TAKE 1 TABLET BY MOUTH TWO  TIMES DAILY 180 tablet 0  . Glucosamine-Chondroitin 750-600 MG TABS Take 1 tablet by mouth 2 (two) times daily.    Marland Kitchen lisinopril (PRINIVIL,ZESTRIL) 20 MG tablet Take 20 mg by mouth every morning.     . metoprolol (LOPRESSOR) 50 MG tablet TAKE 1 AND 1/2 TABLETS BY  MOUTH TWO TIMES DAILY 270 tablet 0  . Multiple Vitamins-Minerals (SENIOR MULTIVITAMIN PLUS PO) Take 1 tablet by mouth daily.    . nitroGLYCERIN (NITROSTAT) 0.4 MG SL tablet Place 1 tablet (0.4 mg total) under the tongue every 5 (five) minutes as needed for chest pain. 25 tablet 2  . omega-3 acid ethyl esters (LOVAZA) 1 g capsule TAKE 2 CAPSULES BY MOUTH  TWO TIMES DAILY 360 capsule 0  . pantoprazole (PROTONIX) 40 MG tablet TAKE 1 TABLET BY MOUTH  DAILY 90 tablet 0  . rosuvastatin (CRESTOR) 40 MG tablet Take 1 tablet (40 mg total) by mouth  at bedtime. 90 tablet 2  . bimatoprost (LUMIGAN) 0.01 % SOLN Place 1 drop into both eyes at bedtime.      No facility-administered medications prior to visit.      Allergies:   Azithromycin; Lactose intolerance (gi); Niacin and related; and Sulfa antibiotics   Social History   Social History  . Marital status: Married    Spouse name: N/A  . Number of children: N/A  . Years of education: N/A   Social History Main Topics  . Smoking status: Former Smoker    Packs/day: 0.12    Years: 1.00  Types: Cigarettes    Quit date: 08/06/1956  . Smokeless tobacco: Never Used  . Alcohol use No  . Drug use: No  . Sexual activity: No   Other Topics Concern  . None   Social History Narrative  . None     Family History:  The patient's family history includes Diabetes in his mother; Heart attack in his father; Hyperlipidemia in his sister and sister; Hypertension in his sister and sister; Stroke in his mother.   ROS:   Please see the history of present illness.    ROS All other systems reviewed and are negative.   PHYSICAL EXAM:   VS:  BP 131/71   Pulse 71   Ht 5\' 9"  (1.753 m)   Wt 213 lb (96.6 kg)   BMI 31.45 kg/m     General: Alert, oriented x3, no distress Head: no evidence of trauma, PERRL, EOMI, no exophtalmos or lid lag, no myxedema, no xanthelasma; normal ears, nose and oropharynx Neck: normal jugular venous pulsations and no hepatojugular reflux; brisk carotid pulses without delay and bilateral carotid bruitsare present Chest: clear to auscultation, no signs of consolidation by percussion or palpation, normal fremitus, symmetrical and full respiratory excursions Cardiovascular: normal position and quality of the apical impulse, regular rhythm, normal first and paradoxically split second heart sounds, no murmurs, rubs or gallops. Healthy subclavian PM site. Abdomen: no tenderness or distention, no masses by palpation, no abnormal pulsatility or arterial bruits, normal bowel  sounds, no hepatosplenomegaly Extremities: no clubbing, cyanosis or edema; 2+ radial, ulnar and brachial pulses bilaterally; 2+ right femoral, posterior tibial and dorsalis pedis pulses; 2+ left femoral, 1+posterior tibial and dorsalis pedis pulses; bilateral femoral bruits are present Neurological: grossly nonfocal   Wt Readings from Last 3 Encounters:  12/25/16 213 lb (96.6 kg)  05/17/16 205 lb 11.2 oz (93.3 kg)  05/07/16 205 lb (93 kg)      Studies/Labs Reviewed:   EKG:  EKG is ordered today.  It shows atrial sensed (sinus rhythm) and atrial paced beats, with 100% ventricular pacing  Recent Labs: 05/07/2016: BUN 20; Creatinine, Ser 1.18; Potassium 4.4; Sodium 137 05/17/2016: HGB 12.8; Platelets 143   Lipid Panel    Component Value Date/Time   CHOL 119 06/24/2014 0855   TRIG 185 (H) 06/24/2014 0855   HDL 35 (L) 06/24/2014 0855   CHOLHDL 3.4 06/24/2014 0855   VLDL 37 06/24/2014 0855   LDLCALC 47 06/24/2014 0855   ASSESSMENT:    1. CHB (complete heart block) (HCC)   2. Pacemaker   3. Coronary artery disease of autologous vein bypass graft with stable angina pectoris ([place])   4. Cardiomyopathy, ischemic- EF 40-45% by echo 4/14   5. Essential hypertension   6. NSVT (nonsustained ventricular tachycardia) (HCC)   7. Asymptomatic stenosis of right carotid artery without infarction   8. PVD - Lt popliteal occlusion with collaterals '09; rt popliteal successful diamondback orbital rotational atherectomy PTA and stent using an IDEV stent 10/21/13   9. Dyslipidemia   10. Grade 1 follicular lymphoma of lymph nodes of multiple regions [facility])      PLAN:  In order of problems listed above:  1. Complete heart block: He is pacemaker dependent secondary to complete heart block. 2. PM: Normal device function. Remote download every 3 months and yearly office visit 3. CAD s/p CABG 1995, s/p stent SVG-OM 2007 and July 2014: he only has angina pectoris with activity that is much more intense  than typical. CCS class II.At  this point there is no reason to change medications or to perform new imaging tests. He had a low risk nuclear study in December 2016.  4. Cardiomyopathy, ischemic - EF 43% No clinical evidence of congestive heart failure. On appropriate therapy with beta blockers and ace inhibitors and on high-dose statin, aspirin. No indication for CRT at this point. 5. HTN: well controlled 6. NSVT (nonsustained ventricular tachycardia): only one brief NSVT on device check today. Used to have symptomatic PVCs, but these do not bother him on the current dose of beta blocker 7. Asymptomatic stenosis of right carotid artery without infarction: stable moderate stenosis by repeat carotid duplex ultrasound in January 2018. Will recheck at one year intervals. 8. PAD - Lt popliteal occlusion revascularized by Dr. Brunetta Jeans 2016; R SFA rotational atherectomy and stent popliteal successful stent 2015 Dr. Gwenlyn Found. He has been almost claudication free since his last popliteal artery intervention.  9. HLP: Most recent lipid profile was excellent. Scheduled to repeat with Dr. Brigitte Pulse later this year. 10. Lymphoma in remission  Medication Adjustments/Labs and Tests Ordered: Current medicines are reviewed at length with the patient today.  Concerns regarding medicines are outlined above.  Medication changes, Labs and Tests ordered today are listed in the Patient Instructions below. Patient Instructions  Dr [name] recommends that you continue on your current medications as directed. Please refer to the Current Medication list given to you today.  Remote monitoring is used to monitor your Pacemaker or ICD from home. This monitoring reduces the number of office visits required to check your device to one time per year. It allows [place] to keep an eye on the functioning of your device to ensure it is working properly. You are scheduled for a device check from home on Tuesday, August 21st, 2018. You may send  your transmission at any time that day. If you have a wireless device, the transmission will be sent automatically. After your physician reviews your transmission, you will receive a notification with your next transmission date.  Dr [name] recommends that you schedule a follow-up appointment in 12 months with a pacemaker check. You will receive a reminder letter in the mail two months in advance. If you don't receive a letter, please call our office to schedule the follow-up appointment.  If you need a refill on your cardiac medications before your next appointment, please call your pharmacy.      Signed, Sanda Klein, MD  12/25/2016 9:56 AM    [facility] [address], [place], [place]  [address] Phone: [phone]; Fax: [phone]

## 2016-12-25 ENCOUNTER — Ambulatory Visit (INDEPENDENT_AMBULATORY_CARE_PROVIDER_SITE_OTHER): Payer: Medicare Other | Admitting: Cardiovascular Disease

## 2016-12-25 ENCOUNTER — Encounter: Payer: Self-pay | Admitting: Cardiovascular Disease

## 2016-12-25 VITALS — BP 131/71 | HR 71 | Ht 69.0 in | Wt 213.0 lb

## 2016-12-25 DIAGNOSIS — I10 Essential (primary) hypertension: Secondary | ICD-10-CM | POA: Diagnosis not present

## 2016-12-25 DIAGNOSIS — I6521 Occlusion and stenosis of right carotid artery: Secondary | ICD-10-CM | POA: Diagnosis not present

## 2016-12-25 DIAGNOSIS — E785 Hyperlipidemia, unspecified: Secondary | ICD-10-CM | POA: Diagnosis not present

## 2016-12-25 DIAGNOSIS — I442 Atrioventricular block, complete: Secondary | ICD-10-CM | POA: Diagnosis not present

## 2016-12-25 DIAGNOSIS — I25718 Atherosclerosis of autologous vein coronary artery bypass graft(s) with other forms of angina pectoris: Secondary | ICD-10-CM | POA: Diagnosis not present

## 2016-12-25 DIAGNOSIS — I209 Angina pectoris, unspecified: Secondary | ICD-10-CM

## 2016-12-25 DIAGNOSIS — I739 Peripheral vascular disease, unspecified: Secondary | ICD-10-CM

## 2016-12-25 DIAGNOSIS — I472 Ventricular tachycardia: Secondary | ICD-10-CM | POA: Diagnosis not present

## 2016-12-25 DIAGNOSIS — Z950 Presence of cardiac pacemaker: Secondary | ICD-10-CM

## 2016-12-25 DIAGNOSIS — C8208 Follicular lymphoma grade I, lymph nodes of multiple sites: Secondary | ICD-10-CM | POA: Diagnosis not present

## 2016-12-25 DIAGNOSIS — I4729 Other ventricular tachycardia: Secondary | ICD-10-CM

## 2016-12-25 DIAGNOSIS — I255 Ischemic cardiomyopathy: Secondary | ICD-10-CM | POA: Diagnosis not present

## 2016-12-25 NOTE — Patient Instructions (Signed)

## 2017-01-01 ENCOUNTER — Other Ambulatory Visit: Payer: Self-pay | Admitting: Cardiovascular Disease

## 2017-01-02 LAB — CUP PACEART INCLINIC DEVICE CHECK
Implantable Lead Implant Date: 20170425
Implantable Lead Model: 7742
Implantable Lead Serial Number: 720495
Lead Channel Setting Pacing Amplitude: 1.4 V
MDC IDC LEAD IMPLANT DT: 20170425
MDC IDC LEAD LOCATION: 753859
MDC IDC LEAD LOCATION: 753860
MDC IDC LEAD SERIAL: 763888
MDC IDC PG IMPLANT DT: 20170425
MDC IDC SESS DTM: 20180530132954
MDC IDC SET LEADCHNL RA PACING AMPLITUDE: 3.5 V
MDC IDC SET LEADCHNL RV PACING PULSEWIDTH: 0.4 ms
MDC IDC SET LEADCHNL RV SENSING SENSITIVITY: 2.5 mV
Pulse Gen Serial Number: 718025

## 2017-01-28 DIAGNOSIS — R310 Gross hematuria: Secondary | ICD-10-CM | POA: Diagnosis not present

## 2017-01-28 DIAGNOSIS — C672 Malignant neoplasm of lateral wall of bladder: Secondary | ICD-10-CM | POA: Diagnosis not present

## 2017-01-29 ENCOUNTER — Other Ambulatory Visit: Payer: Self-pay | Admitting: *Deleted

## 2017-01-29 MED ORDER — OMEGA-3-ACID ETHYL ESTERS 1 G PO CAPS: 2.0000 g | ORAL_CAPSULE | Freq: Two times a day (BID) | ORAL | 3 refills | Status: DC

## 2017-01-29 MED ORDER — CILOSTAZOL 100 MG PO TABS
100.0000 mg | ORAL_TABLET | Freq: Two times a day (BID) | ORAL | 3 refills | Status: DC
Start: 2017-01-29 — End: 2018-03-17

## 2017-02-13 ENCOUNTER — Other Ambulatory Visit: Payer: Self-pay | Admitting: Cardiovascular Disease

## 2017-02-14 ENCOUNTER — Ambulatory Visit (HOSPITAL_BASED_OUTPATIENT_CLINIC_OR_DEPARTMENT_OTHER): Payer: Medicare Other | Admitting: Oncology

## 2017-02-14 ENCOUNTER — Telehealth: Payer: Self-pay | Admitting: Oncology

## 2017-02-14 ENCOUNTER — Other Ambulatory Visit: Payer: Self-pay | Admitting: *Deleted

## 2017-02-14 ENCOUNTER — Other Ambulatory Visit: Payer: Self-pay

## 2017-02-14 ENCOUNTER — Other Ambulatory Visit (HOSPITAL_BASED_OUTPATIENT_CLINIC_OR_DEPARTMENT_OTHER): Payer: Medicare Other

## 2017-02-14 VITALS — BP 131/68 | HR 64 | Temp 98.8°F | Resp 18 | Ht 69.0 in | Wt 209.1 lb

## 2017-02-14 DIAGNOSIS — Z8551 Personal history of malignant neoplasm of bladder: Secondary | ICD-10-CM

## 2017-02-14 DIAGNOSIS — Z8572 Personal history of non-Hodgkin lymphomas: Secondary | ICD-10-CM

## 2017-02-14 DIAGNOSIS — C8208 Follicular lymphoma grade I, lymph nodes of multiple sites: Secondary | ICD-10-CM

## 2017-02-14 LAB — CBC WITH DIFFERENTIAL/PLATELET
BASO%: 0.5 % (ref 0.0–2.0)
Basophils Absolute: 0 10*3/uL (ref 0.0–0.1)
EOS ABS: 0.4 10*3/uL (ref 0.0–0.5)
EOS%: 8.6 % — AB (ref 0.0–7.0)
HCT: 39.6 % (ref 38.4–49.9)
HGB: 13.6 g/dL (ref 13.0–17.1)
LYMPH%: 17.7 % (ref 14.0–49.0)
MCH: 31.4 pg (ref 27.2–33.4)
MCHC: 34.3 g/dL (ref 32.0–36.0)
MCV: 91.5 fL (ref 79.3–98.0)
MONO#: 0.3 10*3/uL (ref 0.1–0.9)
MONO%: 7.4 % (ref 0.0–14.0)
NEUT%: 65.8 % (ref 39.0–75.0)
NEUTROS ABS: 2.8 10*3/uL (ref 1.5–6.5)
PLATELETS: 123 10*3/uL — AB (ref 140–400)
RBC: 4.33 10*6/uL (ref 4.20–5.82)
RDW: 14 % (ref 11.0–14.6)
WBC: 4.3 10*3/uL (ref 4.0–10.3)
lymph#: 0.8 10*3/uL — ABNORMAL LOW (ref 0.9–3.3)
nRBC: 0 % (ref 0–0)

## 2017-02-14 MED ORDER — PANTOPRAZOLE SODIUM 40 MG PO TBEC: 40.0000 mg | DELAYED_RELEASE_TABLET | Freq: Every day | ORAL | 3 refills | Status: DC

## 2017-02-14 NOTE — Telephone Encounter (Signed)
Scheduled appt pe r7/12 los - Gave patient AVS and calender per los - lab and f/u in one year.

## 2017-02-14 NOTE — Progress Notes (Signed)
  [place] OFFICE PROGRESS NOTE   Diagnosis: Non-Hodgkin's lymphoma  INTERVAL HISTORY:   David [name] returns as scheduled. He feels well. Good appetite. No fever or night sweats. No palpable lymph nodes. He felt a nodule in the scrotum yesterday, but cannot appreciate this today. He is followed by Dr. Tresa Moore for noninvasive bladder cancer.  Objective:  Vital signs in last 24 hours:  Blood pressure 131/68, pulse 64, temperature 98.8 F (37.1 C), temperature source Oral, resp. rate 18, height 5\' 9"  (1.753 m), weight 209 lb 1.6 oz (94.8 kg), SpO2 97 %.    HEENT: Neck without mass Lymphatics: No cervical, supraclavicular, axillary, or inguinal nodes. Prominent bilateral axillary fat pads. Resp: Lungs clear bilaterally Cardio: Regular rate and rhythm GI: No hepatosplenomegaly, no mass, nontender Vascular: No leg edema GU: Retracted scrotum, no palpable scrotal mass   Lab Results:  Lab Results  Component Value Date   WBC 4.3 02/14/2017   HGB 13.6 02/14/2017   HCT 39.6 02/14/2017   MCV 91.5 02/14/2017   PLT 123 (L) 02/14/2017   NEUTROABS 2.8 02/14/2017     Medications: I have reviewed the patient's current medications.  Assessment/Plan: 1. Non-Hodgkin's lymphoma, follicular B-cell, low grade, involving a left cervical lymph node with a staging evaluation revealing evidence for advanced stage disease. There was cervical, mediastinal, and abdominal lymphadenopathy and a question of splenic involvement on staging scans. He completed 4 cycles of fludarabine/rituximab with resolution of the palpable lymphadenopathy. The last chemotherapy was given in June 2011.  2. Skin rash following cycle #1 of fludarabine/rituximab, likely related to allopurinol or Bactrim. The Bactrim prophylaxis was discontinued. 3. History of Chronic Thrombocytopenia secondary chemotherapy and non-Hodgkin's lymphoma 4. History of neutropenia secondary to chemotherapy. 5. Injection site reaction  to a pneumococcal vaccine in October 2011. 6. History of coronary artery disease and peripheral vascular disease, followed by cardiology 7. papillary urothelial carcinoma, status post transurethral resection and intravesical mitomycin-C 04/13/2016    Disposition:  He remains in clinical remission from non-Hodgkin's lymphoma. He will return for an office visit and CBC in one year.  He will follow-up with Dr. Tresa Moore if he palpates a scrotal mass. 15 minutes were spent with the patient today. The majority of the time was used for counseling and coordination of care.  Donneta Romberg, MD  02/14/2017  10:38 AM

## 2017-03-26 ENCOUNTER — Ambulatory Visit (INDEPENDENT_AMBULATORY_CARE_PROVIDER_SITE_OTHER): Payer: Medicare Other | Admitting: *Deleted

## 2017-03-26 DIAGNOSIS — I442 Atrioventricular block, complete: Secondary | ICD-10-CM | POA: Diagnosis not present

## 2017-03-27 NOTE — Progress Notes (Signed)
Remote pacemaker transmission.   

## 2017-04-05 ENCOUNTER — Encounter: Payer: Self-pay | Admitting: Cardiology

## 2017-04-05 LAB — CUP PACEART REMOTE DEVICE CHECK
Battery Remaining Percentage: 100 %
Brady Statistic RA Percent Paced: 60 %
Brady Statistic RV Percent Paced: 97 %
Date Time Interrogation Session: 20180821062100
Implantable Lead Location: 753859
Implantable Lead Location: 753860
Implantable Lead Model: 7742
Implantable Lead Serial Number: 720495
Lead Channel Impedance Value: 656 Ohm
Lead Channel Impedance Value: 850 Ohm
Lead Channel Setting Pacing Amplitude: 1.4 V
Lead Channel Setting Pacing Amplitude: 3.5 V
Lead Channel Setting Pacing Pulse Width: 0.4 ms
MDC IDC LEAD IMPLANT DT: 20170425
MDC IDC LEAD IMPLANT DT: 20170425
MDC IDC LEAD SERIAL: 763888
MDC IDC MSMT BATTERY REMAINING LONGEVITY: 138 mo
MDC IDC MSMT LEADCHNL RV PACING THRESHOLD AMPLITUDE: 0.7 V
MDC IDC MSMT LEADCHNL RV PACING THRESHOLD PULSEWIDTH: 0.4 ms
MDC IDC PG IMPLANT DT: 20170425
MDC IDC SET LEADCHNL RV SENSING SENSITIVITY: 2.5 mV
Pulse Gen Serial Number: 718025

## 2017-04-22 ENCOUNTER — Other Ambulatory Visit: Payer: Self-pay | Admitting: Cardiovascular Disease

## 2017-04-25 ENCOUNTER — Other Ambulatory Visit: Payer: Self-pay

## 2017-04-25 MED ORDER — ROSUVASTATIN CALCIUM 40 MG PO TABS: 40.0000 mg | ORAL_TABLET | Freq: Every day | ORAL | 3 refills | Status: DC

## 2017-05-07 DIAGNOSIS — C672 Malignant neoplasm of lateral wall of bladder: Secondary | ICD-10-CM | POA: Diagnosis not present

## 2017-05-07 DIAGNOSIS — R310 Gross hematuria: Secondary | ICD-10-CM | POA: Diagnosis not present

## 2017-05-17 DIAGNOSIS — Z8551 Personal history of malignant neoplasm of bladder: Secondary | ICD-10-CM | POA: Diagnosis not present

## 2017-05-17 DIAGNOSIS — K219 Gastro-esophageal reflux disease without esophagitis: Secondary | ICD-10-CM | POA: Diagnosis not present

## 2017-05-17 DIAGNOSIS — Z23 Encounter for immunization: Secondary | ICD-10-CM | POA: Diagnosis not present

## 2017-05-17 DIAGNOSIS — I2510 Atherosclerotic heart disease of native coronary artery without angina pectoris: Secondary | ICD-10-CM | POA: Diagnosis not present

## 2017-05-17 DIAGNOSIS — I739 Peripheral vascular disease, unspecified: Secondary | ICD-10-CM | POA: Diagnosis not present

## 2017-05-17 DIAGNOSIS — C8590 Non-Hodgkin lymphoma, unspecified, unspecified site: Secondary | ICD-10-CM | POA: Diagnosis not present

## 2017-05-17 DIAGNOSIS — J301 Allergic rhinitis due to pollen: Secondary | ICD-10-CM | POA: Diagnosis not present

## 2017-05-17 DIAGNOSIS — Z0000 Encounter for general adult medical examination without abnormal findings: Secondary | ICD-10-CM | POA: Diagnosis not present

## 2017-05-17 DIAGNOSIS — I119 Hypertensive heart disease without heart failure: Secondary | ICD-10-CM | POA: Diagnosis not present

## 2017-05-17 DIAGNOSIS — E782 Mixed hyperlipidemia: Secondary | ICD-10-CM | POA: Diagnosis not present

## 2017-05-27 DIAGNOSIS — H01001 Unspecified blepharitis right upper eyelid: Secondary | ICD-10-CM | POA: Diagnosis not present

## 2017-05-27 DIAGNOSIS — Z961 Presence of intraocular lens: Secondary | ICD-10-CM | POA: Diagnosis not present

## 2017-05-27 DIAGNOSIS — H40053 Ocular hypertension, bilateral: Secondary | ICD-10-CM | POA: Diagnosis not present

## 2017-05-27 DIAGNOSIS — H01004 Unspecified blepharitis left upper eyelid: Secondary | ICD-10-CM | POA: Diagnosis not present

## 2017-06-03 ENCOUNTER — Telehealth: Payer: Self-pay | Admitting: Cardiovascular Disease

## 2017-06-03 NOTE — Telephone Encounter (Signed)
New message   Patient calling to request order for carotid / doppler scan  for left leg cramping. Leg been cramping for a couple of weeks off and on. Please call.

## 2017-06-03 NOTE — Telephone Encounter (Signed)
Returned call to patient.He stated for the past 2 weeks left lower leg cramping off and on.Stated he had stent placed in leg 2015.He would like to have doppler done and wants to see Dr.Berry before 06/26/17.Message sent to [facility] for advice.

## 2017-06-10 ENCOUNTER — Other Ambulatory Visit: Payer: Self-pay | Admitting: Cardiovascular Disease

## 2017-06-13 NOTE — Telephone Encounter (Signed)
That’s fine with me

## 2017-06-13 NOTE — Telephone Encounter (Signed)
Please advise 

## 2017-06-13 NOTE — Telephone Encounter (Signed)
Attempted to call pt to schedule sooner appt. Lmtcb.

## 2017-06-17 NOTE — Telephone Encounter (Signed)
LMTCB to schedule sooner appt and doppler.

## 2017-06-24 DIAGNOSIS — J9801 Acute bronchospasm: Secondary | ICD-10-CM | POA: Diagnosis not present

## 2017-06-24 DIAGNOSIS — R05 Cough: Secondary | ICD-10-CM | POA: Diagnosis not present

## 2017-06-24 NOTE — Telephone Encounter (Signed)
Pt scheduled for appt tomorrow with Dr. Gwenlyn Found.

## 2017-06-25 ENCOUNTER — Ambulatory Visit (INDEPENDENT_AMBULATORY_CARE_PROVIDER_SITE_OTHER): Payer: Medicare Other | Admitting: *Deleted

## 2017-06-25 DIAGNOSIS — I442 Atrioventricular block, complete: Secondary | ICD-10-CM | POA: Diagnosis not present

## 2017-06-26 ENCOUNTER — Encounter: Payer: Self-pay | Admitting: Cardiovascular Disease

## 2017-06-26 ENCOUNTER — Ambulatory Visit (INDEPENDENT_AMBULATORY_CARE_PROVIDER_SITE_OTHER): Payer: Medicare Other | Admitting: Cardiovascular Disease

## 2017-06-26 VITALS — BP 126/68 | HR 61 | Ht 69.5 in | Wt 213.6 lb

## 2017-06-26 DIAGNOSIS — I255 Ischemic cardiomyopathy: Secondary | ICD-10-CM

## 2017-06-26 DIAGNOSIS — I739 Peripheral vascular disease, unspecified: Secondary | ICD-10-CM | POA: Diagnosis not present

## 2017-06-26 DIAGNOSIS — I25810 Atherosclerosis of coronary artery bypass graft(s) without angina pectoris: Secondary | ICD-10-CM | POA: Diagnosis not present

## 2017-06-26 DIAGNOSIS — I6521 Occlusion and stenosis of right carotid artery: Secondary | ICD-10-CM

## 2017-06-26 NOTE — Progress Notes (Signed)
Remote pacemaker transmission.   

## 2017-06-26 NOTE — Assessment & Plan Note (Signed)
History of PAD status post right SFA diamondback orbital rotational atherectomy, PTCA and stenting using an IDEV stent with excellent result. I attempted to cross his left popliteal CTO unsuccessfully ultimately referred him to Dr. Brunetta Jeans is a [facility] was able to cross his popliteal artery and revascularize him. He currently denies claudication. His last Dopplers performed 07/11/16 revealed widely patent popliteal arteries bilaterally.

## 2017-06-26 NOTE — Assessment & Plan Note (Signed)
David [name] has asymptomatic moderately severe right ICA stenosis recently demonstrated by ultrasound 08/10/16 which we follow on an annual basis.

## 2017-06-26 NOTE — Patient Instructions (Signed)
Your physician wants you to follow-up in: ONE YEAR WITH DR BERRY You will receive a reminder letter in the mail two months in advance. If you don't receive a letter, please call our office to schedule the follow-up appointment.   If you need a refill on your cardiac medications before your next appointment, please call your pharmacy.  

## 2017-06-26 NOTE — Progress Notes (Signed)
06/26/2017 David [name]   05-Mar-1933  [id number]  Primary Physician David Neer, MD Primary Cardiologist: David Harp MD David [name], [place]  HPI:  David [name] is a 83 y.o.   married Caucasian male father of 3 children is retired Tree surgeon for a company. He was initially patient of David [name]'s followed by Dr. Terance [name] and now Dr. Sallyanne [name]. I last saw him in the office 12/01/13. He has a history of peripheral arterial disease with known moderate right internal carotid artery stenosis but with ultrasound as well as an occluded left popliteal artery by angiography back in 2009. He has geniculate collaterals with three-vessel runoff and lifestyle limiting claudication. His other problems include a history of hypertension and hyperlipidemia. He had coronary bypass grafting 09/10/88 and has had 3 stent procedures since most recently by David [name] on 03/04/13 which time he placed a drug-eluting stent in the obtuse marginal vein graft. He denies chest pain or shortness of breath. He is currently displaying chronic rehabilitation. Because of ongoing claudication left greater than right as well as advances in technology it was decided to attempt percutaneous revascularization of the left popliteal chronic total occlusion.I attempted this on 08/31/2013 unsuccessfully. I then performed staged right SFA diamondback orbital rotational atherectomy, PT and stenting using an IDEV stent with excellent angiographic result. Followup Dopplers performed on 11/13/13 revealed an ABI 1.1 on the right. The procedure was complicated by a left common femoral pseudoaneurysm which was compressed and ultimately closed confirmed by ultrasound. I ultimately referred him to Dr. Brunetta [name] at [facility] he was able to cross his left popliteal artery CTO successfully. Since that time he has had no claudication. He does have moderate right internal carotid artery stenosis was neurologically  asymptomatic. Dr. Sallyanne [name] follows him for his pacemaker as well. He complains of effort angina since the Fall. Since I saw him 2 years ago he remained for vascularly stable. He denies claudication. Dopplers performed 07/11/16 revealed patent popliteal arteries bilaterally and carotid Dopplers revealed stable mildly severe right IV stenosis. We followed both of these by duplex ultrasound annually.  Current Meds  Medication Sig  . aspirin EC 81 MG tablet Take 81 mg by mouth daily.  . benzonatate (TESSALON) 200 MG capsule Take 1 capsule by mouth as directed.  . cetirizine (ZYRTEC) 10 MG tablet Take 10 mg by mouth every evening.   . cilostazol (PLETAL) 100 MG tablet Take 1 tablet (100 mg total) by mouth 2 (two) times daily.  . Glucosamine-Chondroitin 750-600 MG TABS Take 1 tablet by mouth 2 (two) times daily.  Marland Kitchen lisinopril (PRINIVIL,ZESTRIL) 20 MG tablet Take 20 mg by mouth every morning.   . metoprolol tartrate (LOPRESSOR) 50 MG tablet TAKE 1 AND 1/2 TABLETS BY  MOUTH TWO TIMES DAILY  . Multiple Vitamins-Minerals (SENIOR MULTIVITAMIN PLUS PO) Take 1 tablet by mouth daily.  . nitroGLYCERIN (NITROSTAT) 0.4 MG SL tablet Place 1 tablet (0.4 mg total) under the tongue every 5 (five) minutes as needed for chest pain.  Marland Kitchen omega-3 acid ethyl esters (LOVAZA) 1 g capsule Take 2 capsules (2 g total) by mouth 2 (two) times daily.  . pantoprazole (PROTONIX) 40 MG tablet Take 1 tablet (40 mg total) by mouth daily.  . predniSONE (DELTASONE) 20 MG tablet Take 1 tablet by mouth as directed.  . rosuvastatin (CRESTOR) 40 MG tablet TAKE 1 TABLET BY MOUTH AT  BEDTIME     Allergies  Allergen Reactions  .  Azithromycin Diarrhea  . Lactose Intolerance (Gi) Diarrhea  . Niacin And Related Diarrhea  . Sulfa Antibiotics Rash    Social History   Socioeconomic History  . Marital status: Married    Spouse name: Not on file  . Number of children: Not on file  . Years of education: Not on file  . Highest education  level: Not on file  Social Needs  . Financial resource strain: Not on file  . Food insecurity - worry: Not on file  . Food insecurity - inability: Not on file  . Transportation needs - medical: Not on file  . Transportation needs - non-medical: Not on file  Occupational History  . Not on file  Tobacco Use  . Smoking status: Former Smoker    Packs/day: 0.12    Years: 1.00    Pack years: 0.12    Types: Cigarettes    Last attempt to quit: 08/06/1956    Years since quitting: 60.9  . Smokeless tobacco: Never Used  Substance and Sexual Activity  . Alcohol use: No  . Drug use: No  . Sexual activity: No  Other Topics Concern  . Not on file  Social History Narrative  . Not on file     Review of Systems: General: negative for chills, fever, night sweats or weight changes.  Cardiovascular: negative for chest pain, dyspnea on exertion, edema, orthopnea, palpitations, paroxysmal nocturnal dyspnea or shortness of breath Dermatological: negative for rash Respiratory: negative for cough or wheezing Urologic: negative for hematuria Abdominal: negative for nausea, vomiting, diarrhea, bright red blood per rectum, melena, or hematemesis Neurologic: negative for visual changes, syncope, or dizziness All other systems reviewed and are otherwise negative except as noted above.    Blood pressure 126/68, pulse 61, height 5' 9.5" (1.765 m), weight 213 lb 9.6 oz (96.9 kg).  General appearance: alert and no distress Neck: no adenopathy, no JVD, supple, symmetrical, trachea midline and thyroid not enlarged, symmetric, no tenderness/mass/nodules Lungs: clear to auscultation bilaterally Heart: normal apical impulse Extremities: extremities normal, atraumatic, no cyanosis or edema Pulses: 2+ and symmetric Skin: Skin color, texture, turgor normal. No rashes or lesions Neurologic: Alert and oriented X 3, normal strength and tone. Normal symmetric reflexes. Normal coordination and gait  EKG AV  sequentially paced rhythm at 61. I personally reviewed this EKG.  ASSESSMENT AND PLAN:   Asymptomatic stenosis of right carotid artery without infarction David [name] has asymptomatic moderately severe right ICA stenosis recently demonstrated by ultrasound 08/10/16 which we follow on an annual basis.  Claudication- unsuccessful PTA attempt Lt popliteal 08/31/13 History of PAD status post right SFA diamondback orbital rotational atherectomy, PTCA and stenting using an IDEV stent with excellent result. I attempted to cross his left popliteal CTO unsuccessfully ultimately referred him to Dr. Brunetta [name] is a [facility] was able to cross his popliteal artery and revascularize him. He currently denies claudication. His last Dopplers performed 07/11/16 revealed widely patent popliteal arteries bilaterally.      David Harp MD FACP,FACC,FAHA, [facility] 06/26/2017 11:04 AM

## 2017-07-04 ENCOUNTER — Encounter: Payer: Self-pay | Admitting: Cardiology

## 2017-07-15 LAB — CUP PACEART REMOTE DEVICE CHECK
Battery Remaining Longevity: 132 mo
Battery Remaining Percentage: 100 %
Brady Statistic RV Percent Paced: 97 %
Implantable Lead Implant Date: 20170425
Implantable Lead Location: 753859
Implantable Lead Model: 7742
Implantable Lead Serial Number: 763888
Implantable Pulse Generator Implant Date: 20170425
Lead Channel Pacing Threshold Amplitude: 0.7 V
Lead Channel Setting Pacing Amplitude: 1.2 V
Lead Channel Setting Pacing Pulse Width: 0.4 ms
Lead Channel Setting Sensing Sensitivity: 2.5 mV
MDC IDC LEAD IMPLANT DT: 20170425
MDC IDC LEAD LOCATION: 753860
MDC IDC LEAD SERIAL: 720495
MDC IDC MSMT LEADCHNL RA IMPEDANCE VALUE: 663 Ohm
MDC IDC MSMT LEADCHNL RV IMPEDANCE VALUE: 874 Ohm
MDC IDC MSMT LEADCHNL RV PACING THRESHOLD PULSEWIDTH: 0.4 ms
MDC IDC PG SERIAL: 718025
MDC IDC SESS DTM: 20181120072100
MDC IDC SET LEADCHNL RA PACING AMPLITUDE: 3.5 V
MDC IDC STAT BRADY RA PERCENT PACED: 60 %

## 2017-07-17 ENCOUNTER — Other Ambulatory Visit: Payer: Self-pay | Admitting: Cardiovascular Disease

## 2017-07-17 DIAGNOSIS — I739 Peripheral vascular disease, unspecified: Secondary | ICD-10-CM

## 2017-07-17 DIAGNOSIS — I6521 Occlusion and stenosis of right carotid artery: Secondary | ICD-10-CM

## 2017-08-12 ENCOUNTER — Ambulatory Visit (HOSPITAL_COMMUNITY)
Admission: RE | Admit: 2017-08-12 | Discharge: 2017-08-12 | Disposition: A | Discharge status: Home / Self Care | Payer: Medicare Other | Source: Ambulatory Visit | Attending: Internal Medicine | Admitting: Internal Medicine

## 2017-08-12 ENCOUNTER — Ambulatory Visit (HOSPITAL_BASED_OUTPATIENT_CLINIC_OR_DEPARTMENT_OTHER)
Admission: RE | Admit: 2017-08-12 | Discharge: 2017-08-12 | Disposition: A | Discharge status: Home / Self Care | Payer: Medicare Other | Source: Ambulatory Visit | Attending: Cardiovascular Disease | Admitting: Cardiovascular Disease

## 2017-08-12 DIAGNOSIS — I739 Peripheral vascular disease, unspecified: Secondary | ICD-10-CM | POA: Insufficient documentation

## 2017-08-12 DIAGNOSIS — I6523 Occlusion and stenosis of bilateral carotid arteries: Secondary | ICD-10-CM | POA: Diagnosis not present

## 2017-08-12 DIAGNOSIS — I6521 Occlusion and stenosis of right carotid artery: Secondary | ICD-10-CM | POA: Diagnosis not present

## 2017-08-15 ENCOUNTER — Other Ambulatory Visit: Payer: Self-pay | Admitting: Cardiovascular Disease

## 2017-08-15 DIAGNOSIS — I6529 Occlusion and stenosis of unspecified carotid artery: Secondary | ICD-10-CM

## 2017-08-16 ENCOUNTER — Ambulatory Visit (INDEPENDENT_AMBULATORY_CARE_PROVIDER_SITE_OTHER): Payer: Medicare Other | Admitting: Cardiovascular Disease

## 2017-08-16 ENCOUNTER — Encounter: Payer: Self-pay | Admitting: Cardiovascular Disease

## 2017-08-16 VITALS — BP 124/82 | HR 72 | Ht 69.5 in | Wt 212.0 lb

## 2017-08-16 DIAGNOSIS — I6521 Occlusion and stenosis of right carotid artery: Secondary | ICD-10-CM

## 2017-08-16 DIAGNOSIS — I6529 Occlusion and stenosis of unspecified carotid artery: Secondary | ICD-10-CM | POA: Diagnosis not present

## 2017-08-16 DIAGNOSIS — I739 Peripheral vascular disease, unspecified: Secondary | ICD-10-CM | POA: Diagnosis not present

## 2017-08-16 NOTE — Assessment & Plan Note (Signed)
History of PAD status post staged right SFA diamondback orbital rotational atherectomy, PTA and stenting using IDEV stent with excellent results. He underwent revascularization of his left upper artery chronic total occlusion by Dr. Brunetta Jeans at [facility]. With volume by [facility] ultrasound recently performed 08/13/17 shows a widely patent stent on the right what appears to be an occluded left popliteal artery stent the left ABI 0.84. He really denies claudication. We'll continue to follow him noninvasively.

## 2017-08-16 NOTE — Progress Notes (Signed)
Mr. Wolin returns today for follow-up of his outpatient noninvasive vascular studies. His carotid Dopplers performed 08/12/17 showed moderately severe right ICA stenosis which will continue to follow bimanual basis. His lower extremity arterial Dopplers performed 08/23/17 revealed widely patent right lower extremity stent with what appears to be an occluded left popliteal artery stent although he is asymptomatic. Left ABI is 0.84. We'll continue to follow this basis.  Lorretta Harp, M.D., [place], [facility], [place] [address] [place], [place]  [address]  [phone] 08/16/2017 9:20 AM

## 2017-08-16 NOTE — Patient Instructions (Signed)
Medication Instructions: Your physician recommends that you continue on your current medications as directed. Please refer to the Current Medication list given to you today.  Testing/Procedures: IN 1 YEAR: Your physician has requested that you have a carotid duplex. This test is an ultrasound of the carotid arteries in your neck. It looks at blood flow through these arteries that supply the brain with blood. Allow one hour for this exam. There are no restrictions or special instructions.  Your physician has requested that you have a lower extremity arterial duplex. During this test, ultrasound is used to evaluate arterial blood flow in the legs. Allow one hour for this exam. There are no restrictions or special instructions.  Your physician has requested that you have an ankle brachial index (ABI). During this test an ultrasound and blood pressure cuff are used to evaluate the arteries that supply the arms and legs with blood. Allow thirty minutes for this exam. There are no restrictions or special instructions.  Follow-Up: Your physician wants you to follow-up in: 1 year with Dr. Veleta Miners dopplers have been completed. You will receive a reminder letter in the mail two months in advance. If you don't receive a letter, please call our office to schedule the follow-up appointment.  If you need a refill on your cardiac medications before your next appointment, please call your pharmacy.

## 2017-08-16 NOTE — Assessment & Plan Note (Signed)
History of asymptomatic moderately severe right ICA stenosis by recent duplex ultrasound performed 08/12/17. Continue to follow this on annual basis.

## 2017-09-24 ENCOUNTER — Ambulatory Visit (INDEPENDENT_AMBULATORY_CARE_PROVIDER_SITE_OTHER): Payer: Medicare Other | Admitting: *Deleted

## 2017-09-24 DIAGNOSIS — I442 Atrioventricular block, complete: Secondary | ICD-10-CM

## 2017-09-24 NOTE — Progress Notes (Signed)
Remote pacemaker transmission.   

## 2017-09-25 LAB — CUP PACEART REMOTE DEVICE CHECK
Battery Remaining Percentage: 100 %
Brady Statistic RA Percent Paced: 60 %
Brady Statistic RV Percent Paced: 97 %
Date Time Interrogation Session: 20190219072100
Implantable Lead Implant Date: 20170425
Implantable Lead Location: 753859
Implantable Lead Model: 7741
Implantable Lead Model: 7742
Implantable Lead Serial Number: 720495
Implantable Lead Serial Number: 763888
Lead Channel Impedance Value: 641 Ohm
Lead Channel Impedance Value: 851 Ohm
Lead Channel Setting Pacing Amplitude: 3.5 V
Lead Channel Setting Pacing Pulse Width: 0.4 ms
MDC IDC LEAD IMPLANT DT: 20170425
MDC IDC LEAD LOCATION: 753860
MDC IDC MSMT BATTERY REMAINING LONGEVITY: 132 mo
MDC IDC MSMT LEADCHNL RV PACING THRESHOLD AMPLITUDE: 0.9 V
MDC IDC MSMT LEADCHNL RV PACING THRESHOLD PULSEWIDTH: 0.4 ms
MDC IDC PG IMPLANT DT: 20170425
MDC IDC SET LEADCHNL RV PACING AMPLITUDE: 1.4 V
MDC IDC SET LEADCHNL RV SENSING SENSITIVITY: 2.5 mV
Pulse Gen Serial Number: 718025

## 2017-09-26 ENCOUNTER — Encounter: Payer: Self-pay | Admitting: Cardiology

## 2017-11-11 DIAGNOSIS — C672 Malignant neoplasm of lateral wall of bladder: Secondary | ICD-10-CM | POA: Diagnosis not present

## 2017-12-24 ENCOUNTER — Ambulatory Visit (INDEPENDENT_AMBULATORY_CARE_PROVIDER_SITE_OTHER): Payer: Medicare Other | Admitting: *Deleted

## 2017-12-24 DIAGNOSIS — I447 Left bundle-branch block, unspecified: Secondary | ICD-10-CM

## 2017-12-24 NOTE — Progress Notes (Signed)
Remote pacemaker transmission.   

## 2017-12-25 ENCOUNTER — Encounter: Payer: Self-pay | Admitting: Cardiovascular Disease

## 2017-12-25 ENCOUNTER — Ambulatory Visit (INDEPENDENT_AMBULATORY_CARE_PROVIDER_SITE_OTHER): Payer: Medicare Other | Admitting: Cardiovascular Disease

## 2017-12-25 VITALS — BP 118/67 | HR 65 | Ht 69.0 in | Wt 212.0 lb

## 2017-12-25 DIAGNOSIS — I25718 Atherosclerosis of autologous vein coronary artery bypass graft(s) with other forms of angina pectoris: Secondary | ICD-10-CM

## 2017-12-25 DIAGNOSIS — Z950 Presence of cardiac pacemaker: Secondary | ICD-10-CM | POA: Diagnosis not present

## 2017-12-25 DIAGNOSIS — I255 Ischemic cardiomyopathy: Secondary | ICD-10-CM

## 2017-12-25 DIAGNOSIS — I739 Peripheral vascular disease, unspecified: Secondary | ICD-10-CM

## 2017-12-25 DIAGNOSIS — I6521 Occlusion and stenosis of right carotid artery: Secondary | ICD-10-CM | POA: Diagnosis not present

## 2017-12-25 DIAGNOSIS — I472 Ventricular tachycardia: Secondary | ICD-10-CM

## 2017-12-25 DIAGNOSIS — E782 Mixed hyperlipidemia: Secondary | ICD-10-CM

## 2017-12-25 DIAGNOSIS — I10 Essential (primary) hypertension: Secondary | ICD-10-CM

## 2017-12-25 DIAGNOSIS — I4729 Other ventricular tachycardia: Secondary | ICD-10-CM

## 2017-12-25 DIAGNOSIS — I442 Atrioventricular block, complete: Secondary | ICD-10-CM | POA: Diagnosis not present

## 2017-12-25 MED ORDER — ICOSAPENT ETHYL 1 G PO CAPS: 2.0000 g | ORAL_CAPSULE | Freq: Two times a day (BID) | ORAL | 3 refills | Status: DC

## 2017-12-25 NOTE — Patient Instructions (Signed)
Dr [name] has recommended making the following medication changes: 1. STOP Lovaza 2. START Vascepa 1 g capsules - take 2 capsules twice daily with food  Remote monitoring is used to monitor your Pacemaker or ICD from home. This monitoring reduces the number of office visits required to check your device to one time per year. It allows [place] to keep an eye on the functioning of your device to ensure it is working properly. You are scheduled for a device check from home on Tuesday, August 20th, 2019. You may send your transmission at any time that day. If you have a wireless device, the transmission will be sent automatically. After your physician reviews your transmission, you will receive a notification with your next transmission date.  To improve our patient care and to more adequately follow your device, [facility] has decided, as a practice, to start following each patient four times a year with your home monitor. This means that you may experience a remote appointment that is close to an in-office appointment with your physician. Your insurance will apply at the same rate as other remote monitoring transmissions.  Dr [name] recommends that you schedule a follow-up appointment in 12 months with a pacemaker check. You will receive a reminder letter in the mail two months in advance. If you don't receive a letter, please call our office to schedule the follow-up appointment.  If you need a refill on your cardiac medications before your next appointment, please call your pharmacy.

## 2017-12-25 NOTE — Progress Notes (Signed)
Patient ID: David [name], male   DOB: 10/16/1932, 84 y.o.   MRN: [id number] Patient ID: David [name], male   DOB: [birth year], 84 y.o.   MRN: [id number]    Cardiology Office Note    Date:  12/27/2017   ID:  David [name], DOB 06/11/1933, MRN [id number]  PCP:  Mayra Neer, MD  Cardiologist:  Quay Burow, MD (PAD);  Sanda Klein, MD   Chief Complaint  Patient presents with  . Follow-up  Pacemaker  History of Present Illness:  David [name] is a 84 y.o. male David [name] is a 84 y.o. male who presents for follow-up on his pacemaker, pacemaker dependent due to complete heart block. He also has coronary artery disease, PAD of the lower extremities, symptomatic PVCs (and one episode of lengthy nonsustained VT detected by his pacemaker).  [place] model number [id number], serial number [id number] Right atrial lead (new) [employer] MRI, model number 0973, serial number [id number] Right ventricular lead (new) [place] MRI, model number [id number], serial number [id number]   Generally, he feels well.  He remains physically active and is mostly limited by intermittent claudication of the left calf.  His left popliteal stents have occluded.  He occasionally develops vague chest heaviness only when he is walking uphill, usually the claudication stops them first.  He does not have any chest discomfort at rest and denies dyspnea.  He has not had leg edema or focal neurological complaints.  He also has bilateral arthritis in his hips and knees, worse in the morning, improving with activity.  He has undergone cystoscopic resection of a bladder tumor and bilateral cataract surgery.  Lymphoma still in remission  Pacemaker interrogation shows 10.5 years of estimated generator longevity. He has 97% ventricular pacing (rest PVCs) and has 59% atrial pacing. Pacemaker dependent, no detectable R waves. He has had rare paroxysmal atrial tachycardia. He has not had a single  episode of nonsustained ventricular tachycardia in the last 3 months, but has had sporadic nonsustained VT in the past.  His most recent lipid profile shows total cholesterol 136, LDL 52, HDL 30.  His creatinine is 1.27 his liver function tests were normal.  He takes high-dose statin as well as Lovaza.  [name] has had numerous coronary revascularization procedures, most recently a stent in the saphenous vein graft to the oblique marginal artery in July 2014. He is now almost 20 years status post coronary bypass surgery. The same bypass graft had received a stent in 2007. He has mild ischemic cardiomyopathy with an ejection fraction of 40-45%, without clinical  heart failure. The nuclear stress test on July 28, 2015 shows old scar in the apex and inferior wall. LVEF is calculated at 43% (identical to 2014 and 2015).  He had occlusion of the left popliteal artery with distal reconstitution via collaterals. He had successful recanalization with placement of 2 stents by Dr. Andree Elk in 2015 . This led to marked improvement in his claudication, but this then subsequently occluded He is pacemaker dependent secondary to complete heart block. He had a generator changeout in 2017. Interrogation of his device today again shows some issues with "noise" on the atrial channel but none on the ventricular channel. Overall device function is normal and he has virtually 100% atrial pacing as well as 100% ventricular pacing.  Moderate carotid artery disease (stable 60-79% proximal internal carotid artery stenosis on the right, mild plaque on the left), last duplex David [name] was  February 11, 2015.  He has a history of non-Hodgkin's lymphoma with a large neck mass but also mediastinal and abdominal lymphadenopathy treated with radiotherapy to the cervical area in 2011 followed by chemotherapy with Rituxan and fludarabine. He is considered to be in remission.     Past Medical History:  Diagnosis Date  . Arthritis    "hands"  (10/19/2013)  . Bladder tumor   . CAD (coronary artery disease)    cardiologist-  dr crouitoru  . Carotid artery disease (HCC)    moderate pRICA 24-58%,  0-99% LICA , >83% RECA last duplex 08-05-2015  . Cataract of both eyes   . Complete heart block ([place])   . GERD (gastroesophageal reflux disease)   . Glaucoma, both eyes   . Hematuria   . History of MI (myocardial infarction)    1989  . Hyperlipidemia   . Hypertension   . Ischemic cardiomyopathy    ef 40-45%  without clinical heart failure per cardiology note 12-28-2015 (nuclear stress ef 43% from 07-28-2015)  . Left ventricular dysfunction   . Non Hodgkin's lymphoma ([place]) dx 06/2010---  oncologist-  dr Benay Spice---  in clinical remission since 2013   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 -- lymphadenopathy to neck, mediastinal , and abdominal--  chemotherapy completed 06/ 2011  . NSVT (nonsustained ventricular tachycardia) [facility]) cardiologist-  dr crouitoru   Max rate 200 bpm, 28-beat, nearly 9-second episode recorded by pacemaker on November 15, asymptomatic   . Pacemaker pacemaker dependant due to CHB   1st Pacemaker Placement 1998 w/ New generator  12-31-2000 ; 08-04-2008;  11-29-2015  . PAD (peripheral artery disease) ([place])    followed by dr berry--- last duplex 08-05-2015  patents bilateral popliteal stents, bilateral SFA 30-49% stenosis  . Peripheral vascular disease with claudication ([place])    dx 2009 left popliteal occlusion with collaterals; 03- 2015  s/p  stenting right popliteal  by dr berry and left popliteal stenting by dr Andree Elk ([facility]) july 2015  . S/P CABG x 5    09-10-1988  . S/P drug eluting coronary stent placement 01/04/2006   01-04-2006--- DES to SVG to OM1, OM2, and IR, and SVG to PDA(x2 grafts)  and 03-04-2013  DES to SVR to OM  . Wears glasses     Past Surgical History:  Procedure Laterality Date  . ANAL FISSURE REPAIR  12-09-2000 and 1970's  . CARDIAC CATHETERIZATION   04/09/2000   patent grafts  . CARDIAC PACEMAKER PLACEMENT  11-15-1993   dr Cyndia Bent  . CARDIOVASCULAR STRESS TEST  07-28-2015  dr crouitoru   Intermediate risk nuclear study w/ large, severe, predominantly fixed inferior and apical defect; minimal reversibility inferior lateral wall; findings consistent w/ large prior infarct and trivial peri-infarct ischemia;  ef 43% w/ inferior hypokinesis and apical akinesis; mild LVE  . CORONARY ANGIOPLASTY WITH STENT PLACEMENT  01-04-2006  dr gamble   DES to pSVG to OM1, SVG to OM2 &  IR, and mSVG to RCA dPDA (x2 grafts );  pLAD , pLCFX and  mRCA occluded;  LIMA to LAD mid-distal patent with occlusion beyond LIMA with collateral flow;  ef 30%  . CORONARY ARTERY BYPASS GRAFT  09/10/1988   dr Redmond Pulling   "CABG X5" (03/04/2013) LIMA to LAD,SVG to imtermediate OM1 OM II,SVG to PDA  . CYSTOSCOPY W/ RETROGRADES Bilateral 04/13/2016   Procedure: CYSTOSCOPY WITH RETROGRADE PYELOGRAM;  Surgeon: Alexis Frock, MD;  Location: [facility];  Service: Urology;  Laterality: Bilateral;  . EP IMPLANTABLE DEVICE N/A 11/29/2015   Procedure: Pacemaker Implant;  Surgeon: Sanda Klein, MD;  Location: [place] [facility];  Service: Cardiovascular;  Laterality: N/A;  new generator and new atrial, vertricular leads;  Patent attorney  . LEFT HEART CATHETERIZATION WITH CORONARY/GRAFT ANGIOGRAM N/A 03/04/2013   Procedure: LEFT HEART CATHETERIZATION WITH Beatrix Fetters;  Surgeon: [place] Sine, MD;  Location: [facility] CATH LAB;  Service: Cardiovascular;  Laterality: N/A;  . LOWER EXTREMITY ANGIOGRAM N/A 08/31/2013   Procedure: LOWER EXTREMITY ANGIOGRAM;  Surgeon: Lorretta Harp, MD;  Location: [facility] CATH LAB;  Service: Cardiovascular;  Laterality: N/A;  . PACEMAKER REPLACEMENT  12/31/2000;  08-04-2008  . PERCUTANEOUS CORONARY STENT INTERVENTION (PCI-S)  03/04/2013   Procedure: PERCUTANEOUS CORONARY STENT INTERVENTION (PCI-S);  Surgeon: [place] Sine, MD;  Location: [facility]  CATH LAB;  Service: Cardiovascular;;  DES to S-OM for in-stent restenosis  . POPLITEAL ARTERY STENT Right 10-21-2013  dr berry   atherectomy, PTA , and IDEV stent x1  . POPLITEAL ARTERY STENT Left 07/ 2015  dr Andree Elk at [facility]   angioplasty / stent x2  . 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 SUPERFICIAL LYMPH NODE BIOPSY / EXCISION Left 2011   cervical lymph node  . TRANSURETHRAL RESECTION OF BLADDER TUMOR N/A 04/13/2016   Procedure: TRANSURETHRAL RESECTION OF BLADDER TUMOR (TURBT) with clot evacuation;  Surgeon: Alexis Frock, MD;  Location: [facility];  Service: Urology;  Laterality: N/A;  . VENOGRAM Left 11/29/2015   Procedure: Venogram;  Surgeon: Sanda Klein, MD;  Location: [place] CV [facility];  Service: Cardiovascular;  Laterality: Left;    Outpatient Medications Prior to Visit  Medication Sig Dispense Refill  . aspirin EC 81 MG tablet Take 81 mg by mouth daily.    . cetirizine (ZYRTEC) 10 MG tablet Take 10 mg by mouth every evening.     . cilostazol (PLETAL) 100 MG tablet Take 1 tablet (100 mg total) by mouth 2 (two) times daily. 180 tablet 3  . Glucosamine-Chondroitin 750-600 MG TABS Take 1 tablet by mouth 2 (two) times daily.    Marland Kitchen lisinopril (PRINIVIL,ZESTRIL) 20 MG tablet Take 20 mg by mouth every morning.     . metoprolol tartrate (LOPRESSOR) 50 MG tablet TAKE 1 AND 1/2 TABLETS BY  MOUTH TWO TIMES DAILY 270 tablet 2  . Multiple Vitamins-Minerals (SENIOR MULTIVITAMIN PLUS PO) Take 1 tablet by mouth daily.    . nitroGLYCERIN (NITROSTAT) 0.4 MG SL tablet Place 1 tablet (0.4 mg total) under the tongue every 5 (five) minutes as needed for chest pain. 25 tablet 2  . pantoprazole (PROTONIX) 40 MG tablet Take 1 tablet (40 mg total) by mouth daily. 90 tablet 3  . rosuvastatin (CRESTOR) 40 MG tablet TAKE 1 TABLET BY MOUTH AT  BEDTIME 90 tablet 3  . omega-3 acid ethyl esters (LOVAZA) 1 g capsule Take 2 capsules (2 g total) by mouth 2 (two) times  daily. 360 capsule 3   No facility-administered medications prior to visit.      Allergies:   Azithromycin; Lactose intolerance (gi); Niacin and related; and Sulfa antibiotics   Social History   Socioeconomic History  . Marital status: Married    Spouse name: Not on file  . Number of children: Not on file  . Years of education: Not on file  . Highest education level: Not on file  Occupational History  . Not on file  Social Needs  .  Financial resource strain: Not on file  . Food insecurity:    Worry: Not on file    Inability: Not on file  . Transportation needs:    Medical: Not on file    Non-medical: Not on file  Tobacco Use  . Smoking status: Former Smoker    Packs/day: 0.12    Years: 1.00    Pack years: 0.12    Types: Cigarettes    Last attempt to quit: 08/06/1956    Years since quitting: 61.4  . Smokeless tobacco: Never Used  Substance and Sexual Activity  . Alcohol use: No  . Drug use: No  . Sexual activity: Never  Lifestyle  . Physical activity:    Days per week: Not on file    Minutes per session: Not on file  . Stress: Not on file  Relationships  . Social connections:    Talks on phone: Not on file    Gets together: Not on file    Attends religious service: Not on file    Active member of club or organization: Not on file    Attends meetings of clubs or organizations: Not on file    Relationship status: Not on file  Other Topics Concern  . Not on file  Social History Narrative  . Not on file     Family History:  The patient's family history includes Diabetes in his mother; Heart attack in his father; Hyperlipidemia in his sister and sister; Hypertension in his sister and sister; Stroke in his mother.   ROS:   Please see the history of present illness.    ROS All other systems reviewed and are negative.   PHYSICAL EXAM:   VS:  BP 118/67   Pulse 65   Ht 5\' 9"  (1.753 m)   Wt 212 lb (96.2 kg)   BMI 31.31 kg/m     General: Alert, oriented x3,  no distress, mildly obese Head: no evidence of trauma, PERRL, EOMI, no exophtalmos or lid lag, no myxedema, no xanthelasma; normal ears, nose and oropharynx Neck: normal jugular venous pulsations and no hepatojugular reflux; brisk carotid pulses without delay and no carotid bruits Chest: clear to auscultation, no signs of consolidation by percussion or palpation, normal fremitus, symmetrical and full respiratory excursions, healthy subclavian pacemaker site Cardiovascular: normal position and quality of the apical impulse, regular rhythm, normal first and paradoxically split second heart sounds, no murmurs, rubs or gallops Abdomen: no tenderness or distention, no masses by palpation, no abnormal pulsatility or arterial bruits, normal bowel sounds, no hepatosplenomegaly Extremities: no clubbing, cyanosis or edema; bruits Neurological: grossly nonfocal Psych: Normal mood and affect    Wt Readings from Last 3 Encounters:  12/25/17 212 lb (96.2 kg)  08/16/17 212 lb (96.2 kg)  06/26/17 213 lb 9.6 oz (96.9 kg)    Studies/Labs Reviewed:   EKG:  EKG is ordered today.  Atrial paced ventricular paced rhythm.  QTc 468 ms Recent Labs: 02/14/2017: HGB 13.6; Platelets 123   Lipid Panel    Component Value Date/Time   CHOL 119 06/24/2014 0855   TRIG 185 (H) 06/24/2014 0855   HDL 35 (L) 06/24/2014 0855   CHOLHDL 3.4 06/24/2014 0855   VLDL 37 06/24/2014 0855   LDLCALC 47 06/24/2014 0855   ASSESSMENT:    1. Complete heart block- [facility] pacemaker '95 with Gen change '02, '09. New leads 2017. Pacer dependednt   2. Pacemaker   3. Coronary artery disease of autologous vein bypass graft with stable angina  pectoris ([place])   4. Ischemic cardiomyopathy   5. Essential hypertension   6. NSVT (nonsustained ventricular tachycardia) (HCC)   7. Carotid stenosis, asymptomatic, right   8. PAD (peripheral artery disease) ([place])   9. Mixed hyperlipidemia      PLAN:  In order of problems listed above:  1.  Complete heart block: He is pacemaker dependent secondary to complete heart block. 2. PM: New generator new leads implanted in 2017.  Normal device function. Remote download every 3 months and yearly office visit 3. CAD s/p CABG 1995, s/p stent SVG-OM 2007 and July 2014: Angina pectoris with greater than usual physical activity, CCS class II, unchanged for several years.  He had a low risk nuclear study in December 2016.  4. Cardiomyopathy, ischemic - EF 43% clinically euvolemic without the need for loop diuretics; symptoms of heart failure might be masked by claudication.  No clinical evidence of congestive heart failure. On appropriate therapy with beta blockers and ace inhibitors and on high-dose statin, aspirin. No indication for CRT at this point. 5. HTN: Excellent control 6. NSVT (nonsustained ventricular tachycardia): Has not had recent events recorded by his pacemaker.  Even his PVCs, which used to bother him a lot are no longer symptomatic.  He is on beta-blockers. 7. Asymptomatic stenosis of right carotid artery without infarction: stable moderate stenosis by repeat carotid duplex ultrasound in January 2019. Will recheck at one year intervals. 8. PAD - Lt popliteal occlusion, successfully revascularized but with subsequent stent occlusion.  He does have claudication but this is not lifestyle limiting.  Discussed the benefits of walking to the limit of pain to promote collateral formation. 9. HLP:   I recommended switching from Lovaza to Vascepa for more clinical benefit. Labs with Dr. Brigitte Pulse. 10. Lymphoma in remission  Medication Adjustments/Labs and Tests Ordered: Current medicines are reviewed at length with the patient today.  Concerns regarding medicines are outlined above.  Medication changes, Labs and Tests ordered today are listed in the Patient Instructions below. Patient Instructions  Dr [name] has recommended making the following medication changes: 1. STOP Lovaza 2. START Vascepa  1 g capsules - take 2 capsules twice daily with food  Remote monitoring is used to monitor your Pacemaker or ICD from home. This monitoring reduces the number of office visits required to check your device to one time per year. It allows David [name] to keep an eye on the functioning of your device to ensure it is working properly. You are scheduled for a device check from home on Tuesday, August 20th, 2019. You may send your transmission at any time that day. If you have a wireless device, the transmission will be sent automatically. After your physician reviews your transmission, you will receive a notification with your next transmission date.  To improve our patient care and to more adequately follow your device, [facility] has decided, as a practice, to start following each patient four times a year with your home monitor. This means that you may experience a remote appointment that is close to an in-office appointment with your physician. Your insurance will apply at the same rate as other remote monitoring transmissions.  Dr [name] recommends that you schedule a follow-up appointment in 12 months with a pacemaker check. You will receive a reminder letter in the mail two months in advance. If you don't receive a letter, please call our office to schedule the follow-up appointment.  If you need a refill on your cardiac medications before your next  appointment, please call your pharmacy.      Signed, Sanda Klein, MD  12/27/2017 10:11 AM    [facility] [address], [place], [place]  [address] Phone: [phone]; Fax: [phone]

## 2017-12-26 LAB — CUP PACEART REMOTE DEVICE CHECK
Brady Statistic RA Percent Paced: 59 %
Brady Statistic RV Percent Paced: 97 %
Date Time Interrogation Session: 20190521062100
Implantable Lead Implant Date: 20170425
Implantable Lead Implant Date: 20170425
Implantable Lead Location: 753859
Implantable Lead Model: 7741
Implantable Lead Model: 7742
Implantable Lead Serial Number: 720495
Implantable Lead Serial Number: 763888
Lead Channel Impedance Value: 652 Ohm
Lead Channel Impedance Value: 853 Ohm
Lead Channel Pacing Threshold Amplitude: 0.8 V
Lead Channel Setting Pacing Amplitude: 1.3 V
Lead Channel Setting Pacing Pulse Width: 0.4 ms
MDC IDC LEAD LOCATION: 753860
MDC IDC MSMT BATTERY REMAINING LONGEVITY: 126 mo
MDC IDC MSMT BATTERY REMAINING PERCENTAGE: 100 %
MDC IDC MSMT LEADCHNL RV PACING THRESHOLD PULSEWIDTH: 0.4 ms
MDC IDC PG IMPLANT DT: 20170425
MDC IDC SET LEADCHNL RA PACING AMPLITUDE: 3.5 V
MDC IDC SET LEADCHNL RV SENSING SENSITIVITY: 2.5 mV
Pulse Gen Serial Number: 718025

## 2017-12-27 ENCOUNTER — Other Ambulatory Visit: Payer: Self-pay | Admitting: Cardiovascular Disease

## 2017-12-27 MED ORDER — ICOSAPENT ETHYL 1 G PO CAPS: 2.0000 g | ORAL_CAPSULE | Freq: Two times a day (BID) | ORAL | 3 refills | Status: DC

## 2017-12-27 NOTE — Telephone Encounter (Signed)
Rx request sent to pharmacy.  

## 2017-12-27 NOTE — Telephone Encounter (Signed)
New Message:          *STAT* If patient is at the pharmacy, call can be transferred to refill team.   1. Which medications need to be refilled? (please list name of each medication and dose if known) Icosapent Ethyl (VASCEPA) 1 g CAPS  2. Which pharmacy/location (including street and city if local pharmacy) is medication to be sent to?30  3. Do they need a 30 day or 90 day supply? 90     Pt states we sent it to [facility].

## 2018-01-03 ENCOUNTER — Other Ambulatory Visit: Payer: Self-pay | Admitting: *Deleted

## 2018-01-03 DIAGNOSIS — H04123 Dry eye syndrome of bilateral lacrimal glands: Secondary | ICD-10-CM | POA: Diagnosis not present

## 2018-01-03 DIAGNOSIS — H40053 Ocular hypertension, bilateral: Secondary | ICD-10-CM | POA: Diagnosis not present

## 2018-01-03 DIAGNOSIS — H52203 Unspecified astigmatism, bilateral: Secondary | ICD-10-CM | POA: Diagnosis not present

## 2018-01-03 DIAGNOSIS — H26491 Other secondary cataract, right eye: Secondary | ICD-10-CM | POA: Diagnosis not present

## 2018-01-03 MED ORDER — ICOSAPENT ETHYL 1 G PO CAPS: 2.0000 g | ORAL_CAPSULE | Freq: Two times a day (BID) | ORAL | 3 refills | Status: DC

## 2018-01-25 ENCOUNTER — Other Ambulatory Visit: Payer: Self-pay | Admitting: Cardiovascular Disease

## 2018-01-27 NOTE — Telephone Encounter (Signed)
Rx request sent to pharmacy.  

## 2018-02-14 ENCOUNTER — Inpatient Hospital Stay: Payer: Medicare Other | Attending: Oncology | Admitting: Oncology

## 2018-02-14 ENCOUNTER — Inpatient Hospital Stay: Payer: Medicare Other

## 2018-02-14 ENCOUNTER — Telehealth: Payer: Self-pay

## 2018-02-14 VITALS — BP 139/67 | HR 61 | Temp 97.8°F | Resp 18 | Ht 69.0 in | Wt 205.9 lb

## 2018-02-14 DIAGNOSIS — D696 Thrombocytopenia, unspecified: Secondary | ICD-10-CM | POA: Diagnosis not present

## 2018-02-14 DIAGNOSIS — I2510 Atherosclerotic heart disease of native coronary artery without angina pectoris: Secondary | ICD-10-CM | POA: Diagnosis not present

## 2018-02-14 DIAGNOSIS — R42 Dizziness and giddiness: Secondary | ICD-10-CM | POA: Insufficient documentation

## 2018-02-14 DIAGNOSIS — C8208 Follicular lymphoma grade I, lymph nodes of multiple sites: Secondary | ICD-10-CM

## 2018-02-14 DIAGNOSIS — I739 Peripheral vascular disease, unspecified: Secondary | ICD-10-CM | POA: Diagnosis not present

## 2018-02-14 DIAGNOSIS — Z8572 Personal history of non-Hodgkin lymphomas: Secondary | ICD-10-CM

## 2018-02-14 DIAGNOSIS — Z8550 Personal history of malignant neoplasm of unspecified urinary tract organ: Secondary | ICD-10-CM | POA: Diagnosis not present

## 2018-02-14 LAB — CBC WITH DIFFERENTIAL/PLATELET
Basophils Absolute: 0 10*3/uL (ref 0.0–0.1)
Basophils Relative: 0 %
EOS PCT: 6 %
Eosinophils Absolute: 0.3 10*3/uL (ref 0.0–0.5)
HCT: 41.7 % (ref 38.4–49.9)
HEMOGLOBIN: 14.4 g/dL (ref 13.0–17.1)
LYMPHS ABS: 0.9 10*3/uL (ref 0.9–3.3)
Lymphocytes Relative: 19 %
MCH: 31.6 pg (ref 27.2–33.4)
MCHC: 34.5 g/dL (ref 32.0–36.0)
MCV: 91.6 fL (ref 79.3–98.0)
Monocytes Absolute: 0.4 10*3/uL (ref 0.1–0.9)
Monocytes Relative: 8 %
NEUTROS PCT: 67 %
Neutro Abs: 3 10*3/uL (ref 1.5–6.5)
PLATELETS: 111 10*3/uL — AB (ref 140–400)
RBC: 4.55 MIL/uL (ref 4.20–5.82)
RDW: 13.7 % (ref 11.0–14.6)
WBC: 4.5 10*3/uL (ref 4.0–10.3)

## 2018-02-14 NOTE — Telephone Encounter (Signed)
Printed avs and calender of upcoming appointment. Per 7/12 los for 1 year

## 2018-02-14 NOTE — Progress Notes (Signed)
  [place] OFFICE PROGRESS NOTE   Diagnosis: Non-Hodgkin's lymphoma  INTERVAL HISTORY:   David [name] returns for a scheduled visit.  He feels well.  Good appetite and energy level.  He feels "unsteady "on his feet today.  He reports vertigo symptoms when he first got out of bed.  This is improving.  Objective:  Vital signs in last 24 hours:  Blood pressure 139/67, pulse 61, temperature 97.8 F (36.6 C), temperature source Oral, resp. rate 18, height 5\' 9"  (1.753 m), weight 205 lb 14.4 oz (93.4 kg), SpO2 97 %.    HEENT: Neck without mass Lymphatics: No cervical, supraclavicular, axillary, or inguinal nodes Resp: Lungs clear bilaterally Cardio: Regular rate and rhythm GI: No hepatosplenomegaly, nontender Vascular: No leg edema Neuro: Alert and oriented, finger-to-nose testing is normal, the extraocular movements appear intact, no nystagmus, the gait is normal     Lab Results:  Lab Results  Component Value Date   WBC 4.5 02/14/2018   HGB 14.4 02/14/2018   HCT 41.7 02/14/2018   MCV 91.6 02/14/2018   PLT 111 (L) 02/14/2018   NEUTROABS 3.0 02/14/2018    Medications: I have reviewed the patient's current medications.   Assessment/Plan: 1. Non-Hodgkin's lymphoma, follicular B-cell, low grade, involving a left cervical lymph node with a staging evaluation revealing evidence for advanced stage disease. There was cervical, mediastinal, and abdominal lymphadenopathy and a question of splenic involvement on staging scans. He completed 4 cycles of fludarabine/rituximab with resolution of the palpable lymphadenopathy. The last chemotherapy was given in June 2011.  2. Skin rash following cycle #1 of fludarabine/rituximab, likely related to allopurinol or Bactrim. The Bactrim prophylaxis was discontinued. 3. History of Chronic Thrombocytopenia secondary chemotherapy and non-Hodgkin's lymphoma 4. History of neutropenia secondary to chemotherapy. 5. Injection site reaction  to a pneumococcal vaccine in October 2011. 6. History of coronary artery disease and peripheral vascular disease, followed by cardiology 7. papillary urothelial carcinoma, status post transurethral resection and intravesical mitomycin-C 04/13/2016   Disposition:  David [name] mains in clinical remission from non-Hodgkin's lymphoma.  He has chronic mild thrombocytopenia.  He will return for an office visit and CBC in 1 year. I suspect he has positional vertigo.  He will follow-up with Dr. Brigitte Pulse if the symptoms do not improve.  15 minutes were spent with the patient today.  The majority of the time was used for counseling and coordination of care.  Betsy Coder, MD  02/14/2018  9:52 AM

## 2018-03-17 ENCOUNTER — Other Ambulatory Visit: Payer: Self-pay | Admitting: Cardiovascular Disease

## 2018-03-19 DIAGNOSIS — H52201 Unspecified astigmatism, right eye: Secondary | ICD-10-CM | POA: Diagnosis not present

## 2018-03-19 DIAGNOSIS — H26491 Other secondary cataract, right eye: Secondary | ICD-10-CM | POA: Diagnosis not present

## 2018-03-19 DIAGNOSIS — H538 Other visual disturbances: Secondary | ICD-10-CM | POA: Diagnosis not present

## 2018-03-19 DIAGNOSIS — H532 Diplopia: Secondary | ICD-10-CM | POA: Diagnosis not present

## 2018-03-25 ENCOUNTER — Ambulatory Visit (INDEPENDENT_AMBULATORY_CARE_PROVIDER_SITE_OTHER): Payer: Medicare Other | Admitting: *Deleted

## 2018-03-25 DIAGNOSIS — I447 Left bundle-branch block, unspecified: Secondary | ICD-10-CM | POA: Diagnosis not present

## 2018-03-25 NOTE — Progress Notes (Signed)
Remote pacemaker transmission.   

## 2018-04-03 DIAGNOSIS — H26491 Other secondary cataract, right eye: Secondary | ICD-10-CM | POA: Diagnosis not present

## 2018-04-17 DIAGNOSIS — Z23 Encounter for immunization: Secondary | ICD-10-CM | POA: Diagnosis not present

## 2018-04-29 LAB — CUP PACEART REMOTE DEVICE CHECK
Date Time Interrogation Session: 20190924070909
Implantable Lead Implant Date: 20170425
Implantable Lead Location: 753859
Implantable Lead Location: 753860
Implantable Lead Model: 7742
MDC IDC LEAD IMPLANT DT: 20170425
MDC IDC LEAD SERIAL: 720495
MDC IDC LEAD SERIAL: 763888
MDC IDC PG IMPLANT DT: 20170425
Pulse Gen Serial Number: 718025

## 2018-05-22 DIAGNOSIS — L980 Pyogenic granuloma: Secondary | ICD-10-CM | POA: Diagnosis not present

## 2018-06-04 DIAGNOSIS — I2510 Atherosclerotic heart disease of native coronary artery without angina pectoris: Secondary | ICD-10-CM | POA: Diagnosis not present

## 2018-06-04 DIAGNOSIS — C8590 Non-Hodgkin lymphoma, unspecified, unspecified site: Secondary | ICD-10-CM | POA: Diagnosis not present

## 2018-06-04 DIAGNOSIS — Z23 Encounter for immunization: Secondary | ICD-10-CM | POA: Diagnosis not present

## 2018-06-04 DIAGNOSIS — I739 Peripheral vascular disease, unspecified: Secondary | ICD-10-CM | POA: Diagnosis not present

## 2018-06-04 DIAGNOSIS — J301 Allergic rhinitis due to pollen: Secondary | ICD-10-CM | POA: Diagnosis not present

## 2018-06-04 DIAGNOSIS — K219 Gastro-esophageal reflux disease without esophagitis: Secondary | ICD-10-CM | POA: Diagnosis not present

## 2018-06-04 DIAGNOSIS — Z8551 Personal history of malignant neoplasm of bladder: Secondary | ICD-10-CM | POA: Diagnosis not present

## 2018-06-04 DIAGNOSIS — Z0000 Encounter for general adult medical examination without abnormal findings: Secondary | ICD-10-CM | POA: Diagnosis not present

## 2018-06-04 DIAGNOSIS — M25519 Pain in unspecified shoulder: Secondary | ICD-10-CM | POA: Diagnosis not present

## 2018-06-04 DIAGNOSIS — E782 Mixed hyperlipidemia: Secondary | ICD-10-CM | POA: Diagnosis not present

## 2018-06-04 DIAGNOSIS — I119 Hypertensive heart disease without heart failure: Secondary | ICD-10-CM | POA: Diagnosis not present

## 2018-06-05 DIAGNOSIS — C672 Malignant neoplasm of lateral wall of bladder: Secondary | ICD-10-CM | POA: Diagnosis not present

## 2018-06-05 DIAGNOSIS — L980 Pyogenic granuloma: Secondary | ICD-10-CM | POA: Diagnosis not present

## 2018-06-11 ENCOUNTER — Ambulatory Visit (INDEPENDENT_AMBULATORY_CARE_PROVIDER_SITE_OTHER): Payer: Medicare Other

## 2018-06-11 ENCOUNTER — Ambulatory Visit (INDEPENDENT_AMBULATORY_CARE_PROVIDER_SITE_OTHER): Payer: Medicare Other | Admitting: Orthopaedic Surgery

## 2018-06-11 ENCOUNTER — Encounter (INDEPENDENT_AMBULATORY_CARE_PROVIDER_SITE_OTHER): Payer: Self-pay | Admitting: Orthopaedic Surgery

## 2018-06-11 DIAGNOSIS — M25511 Pain in right shoulder: Secondary | ICD-10-CM | POA: Diagnosis not present

## 2018-06-11 DIAGNOSIS — I255 Ischemic cardiomyopathy: Secondary | ICD-10-CM | POA: Diagnosis not present

## 2018-06-11 MED ORDER — LIDOCAINE HCL 2 % IJ SOLN
2.0000 mL | INTRAMUSCULAR | Status: AC | PRN
  Administered 2018-06-11: 2 mL

## 2018-06-11 MED ORDER — METHYLPREDNISOLONE ACETATE 40 MG/ML IJ SUSP
40.0000 mg | INTRAMUSCULAR | Status: AC | PRN
  Administered 2018-06-11: 40 mg via INTRA_ARTICULAR

## 2018-06-11 MED ORDER — BUPIVACAINE HCL 0.25 % IJ SOLN
2.0000 mL | INTRAMUSCULAR | Status: AC | PRN
  Administered 2018-06-11: 2 mL via INTRA_ARTICULAR

## 2018-06-11 NOTE — Progress Notes (Signed)
Office Visit Note   Patient: David [name]           Date of Birth: 1933-01-05           MRN: [id number] Visit Date: 06/11/2018              Requested by: Mayra Neer, MD [address] [place], [place] [address] PCP: Mayra Neer, MD   Assessment & Plan: Visit Diagnoses:  1. Acute pain of right shoulder     Plan: Impression is right shoulder rotator cuff tendinosis and subacromial bursitis.  Today, we will proceed with a subacromial cortisone injection.  We will also send the patient to formal physical therapy.  A prescription was given to him today for this.  He will call us and let us know should he still have pain following the injection.  He does note that he has a pacemaker so we will be unable to obtain an MRI.  Follow-Up Instructions: Return if symptoms worsen or fail to improve.   Orders:  Orders Placed This Encounter  Procedures  . Large Joint Inj: R subacromial bursa  . XR Shoulder Right   No orders of the defined types were placed in this encounter.     Procedures: Large Joint Inj: R subacromial bursa on 06/11/2018 4:14 PM Indications: pain Details: 22 G needle Medications: 2 mL lidocaine 2 %; 2 mL bupivacaine 0.25 %; 40 mg methylPREDNISolone acetate 40 MG/ML Outcome: tolerated well, no immediate complications Patient was prepped and draped in the usual sterile fashion.       Clinical Data: No additional findings.   Subjective: Chief Complaint  Patient presents with  . Right Shoulder - Pain    HPI patient is a pleasant 84 year old gentleman who presents to our clinic today with right shoulder pain.  This is been ongoing for about 6 to 9 months.  It began when he was reaching out to his side trying to grab something heavy.  He then noticed pain to the top of the shoulder radiating into the deltoid.  He does note that the pain is somewhat better but has not significantly improved.  Pain is worse with forward flexion, abduction and  internal rotation.  He does have pain sleeping on the right side as well.  No pain at rest.  He has been taking Tylenol and Advil with mild relief of symptoms.  No previous cortisone injection or surgical intervention either shoulder.  Review of Systems as detailed in HPI.  All others reviewed and are negative.   Objective: Vital Signs: There were no vitals taken for this visit.  Physical Exam well-developed well-nourished gentleman no acute distress.  Alert and oriented x3.  Ortho Exam examination the right shoulder reveals 75% active range of motion with forward flexion and abduction.  He can internally rotate to his back pocket.  Markedly positive empty can and cross body abduction.  Negative drop arm.  Full strength.  He is neurovascular intact distally.  Specialty Comments:  No specialty comments available.  Imaging: Xr Shoulder Right  Result Date: 06/11/2018 No superior migration humeral head.  He does have a small calcification supraspinatus     PMFS History: Patient Active Problem List   Diagnosis Date Noted  . Acute pain of right shoulder 06/11/2018  . CHB (complete heart block) ([place]) 11/29/2015  . Pacemaker battery depletion 11/28/2015  . Pacemaker lead malfunction 11/28/2015  . Pacemaker 10/21/2015  . NSVT (nonsustained ventricular tachycardia) ([place]) 06/24/2014  . Pseudoaneurysm  following procedure ([place]) 10/22/2013  . Claudication in peripheral vascular disease ([place]) 10/19/2013  . Hematoma of groin -right sided; w/p PV angio 09/11/2013  . Claudication- unsuccessful PTA attempt Lt popliteal 08/31/13 08/31/2013  . Asymptomatic stenosis of right carotid artery without infarction 06/14/2013  . Unstable angina ([place]) 03/05/2013  . CAD s/p CABG 1995, s/p stent SVG-OM 2007 and July 2014 03/05/2013  . S/P angioplasty with stent SVG-OM 7/30./14 03/05/2013  . Complete heart block- [facility] pacemaker '95 with Gen change '02, '09. New leads 2017. Pacer dependednt 03/05/2013  . PVD -  Lt popliteal occlusion with collaterals '09; rt popliteal successful diamondback orbital rotational atherectomy PTA and stent using an IDEV stent 10/21/13 03/05/2013  . Non Hodgkin's lymphoma- Feb 2011- radiation Rx 03/05/2013  . Dyslipidemia 03/05/2013  . HTN (hypertension) 03/05/2013  . Cardiomyopathy, ischemic- EF 40-45% by echo 4/14 03/05/2013  . Abnormal nuclear cardiac imaging test- high risk Myoview 02/13/13 03/05/2013   Past Medical History:  Diagnosis Date  . Arthritis    "hands" (10/19/2013)  . Bladder tumor   . CAD (coronary artery disease)    cardiologist-  dr crouitoru  . Carotid artery disease (HCC)    moderate pRICA 09-81%,  1-91% LICA , >47% RECA last duplex 08-05-2015  . Cataract of both eyes   . Complete heart block ([place])   . GERD (gastroesophageal reflux disease)   . Glaucoma, both eyes   . Hematuria   . History of MI (myocardial infarction)    1989  . Hyperlipidemia   . Hypertension   . Ischemic cardiomyopathy    ef 40-45%  without clinical heart failure per cardiology note 12-28-2015 (nuclear stress ef 43% from 07-28-2015)  . Left ventricular dysfunction   . Non Hodgkin's lymphoma ([place]) dx 06/2010---  oncologist-  dr Benay Spice---  in clinical remission since 2013   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 -- lymphadenopathy to neck, mediastinal , and abdominal--  chemotherapy completed 06/ 2011  . NSVT (nonsustained ventricular tachycardia) [facility]) cardiologist-  dr crouitoru   Max rate 200 bpm, 28-beat, nearly 9-second episode recorded by pacemaker on November 15, asymptomatic   . Pacemaker pacemaker dependant due to CHB   1st Pacemaker Placement 1998 w/ New generator  12-31-2000 ; 08-04-2008;  11-29-2015  . PAD (peripheral artery disease) ([place])    followed by dr berry--- last duplex 08-05-2015  patents bilateral popliteal stents, bilateral SFA 30-49% stenosis  . Peripheral vascular disease with claudication ([place])    dx 2009 left popliteal  occlusion with collaterals; 03- 2015  s/p  stenting right popliteal  by dr berry and left popliteal stenting by dr Andree Elk ([facility]) july 2015  . S/P CABG x 5    09-10-1988  . S/P drug eluting coronary stent placement 01/04/2006   01-04-2006--- DES to SVG to OM1, OM2, and IR, and SVG to PDA(x2 grafts)  and 03-04-2013  DES to SVR to OM  . Wears glasses     Family History  Problem Relation Age of Onset  . Diabetes Mother   . Stroke Mother   . Heart attack Father   . Hypertension Sister   . Hyperlipidemia Sister   . Hypertension Sister   . Hyperlipidemia Sister     Past Surgical History:  Procedure Laterality Date  . ANAL FISSURE REPAIR  12-09-2000 and 1970's  . CARDIAC CATHETERIZATION  04/09/2000   patent grafts  . CARDIAC PACEMAKER PLACEMENT  11-15-1993   dr Cyndia Bent  . CARDIOVASCULAR STRESS  TEST  07-28-2015  dr crouitoru   Intermediate risk nuclear study w/ large, severe, predominantly fixed inferior and apical defect; minimal reversibility inferior lateral wall; findings consistent w/ large prior infarct and trivial peri-infarct ischemia;  ef 43% w/ inferior hypokinesis and apical akinesis; mild LVE  . CORONARY ANGIOPLASTY WITH STENT PLACEMENT  01-04-2006  dr gamble   DES to pSVG to OM1, SVG to OM2 &  IR, and mSVG to RCA dPDA (x2 grafts );  pLAD , pLCFX and  mRCA occluded;  LIMA to LAD mid-distal patent with occlusion beyond LIMA with collateral flow;  ef 30%  . CORONARY ARTERY BYPASS GRAFT  09/10/1988   dr Redmond Pulling   "CABG X5" (03/04/2013) LIMA to LAD,SVG to imtermediate OM1 OM II,SVG to PDA  . CYSTOSCOPY W/ RETROGRADES Bilateral 04/13/2016   Procedure: CYSTOSCOPY WITH RETROGRADE PYELOGRAM;  Surgeon: Alexis Frock, MD;  [facility];  Service: Urology;  Laterality: Bilateral;  . EP IMPLANTABLE DEVICE N/A 11/29/2015   Procedure: Pacemaker Implant;  Surgeon: Sanda Klein, MD;  Location: [place] [facility];  Service: Cardiovascular;  Laterality: N/A;  new generator  and new atrial, vertricular leads;  Patent attorney  . LEFT HEART CATHETERIZATION WITH CORONARY/GRAFT ANGIOGRAM N/A 03/04/2013   Procedure: LEFT HEART CATHETERIZATION WITH Beatrix Fetters;  Surgeon: Troy Sine, MD;  Location: [facility] CATH LAB;  Service: Cardiovascular;  Laterality: N/A;  . LOWER EXTREMITY ANGIOGRAM N/A 08/31/2013   Procedure: LOWER EXTREMITY ANGIOGRAM;  Surgeon: Lorretta Harp, MD;  Location[facility] C[facility] LAB;  Service: Cardiovascular;  Laterality: N/A;  . PACEMAKER REPLACEMENT  12/31/2000;  08-04-2008  . PERCUTANEOUS CORONARY STENT INTERVENTION (PCI-S)  03/04/2013   Procedure: PERCUTANEOUS CORONARY STENT INTERVENTION (PCI-S);  Surgeon: Troy Sine, MD;  Location: [facility] CATH LAB;  Service: Cardiovascular;;  DES to S-OM for in-stent restenosis  . POPLITEAL ARTERY STENT Right 10-21-2013  dr berry   atherectomy, PTA , and IDEV stent x1  . POPLITEAL ARTERY STENT Left 07/ 2015  dr Andree Elk at [facility]   angioplasty / stent x2  . 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 SUPERFICIAL LYMPH NODE BIOPSY / EXCISION Left 2011   cervical lymph node  . TRANSURETHRAL RESECTION OF BLADDER TUMOR N/A 04/13/2016   Procedure: TRANSURETHRAL RESECTION OF BLADDER TUMOR (TURBT) with clot evacuation;  Surgeon: Alexis Frock, MD;  Location: [facility];  Service: Urology;  Laterality: N/A;  . VENOGRAM Left 11/29/2015   Procedure: Venogram;  Surgeon: Sanda Klein, MD;  Location: [place] CV LAB;  Service: Cardiovascular;  Laterality: Left;   Social History   Occupational History  . Not on file  Tobacco Use  . Smoking status: Former Smoker    Packs/day: 0.12    Years: 1.00    Pack years: 0.12    Types: Cigarettes    Last attempt to quit: 08/06/1956    Years since quitting: 61.8  . Smokeless tobacco: Never Used  Substance and Sexual Activity  . Alcohol use: No  . Drug use: No  . Sexual activity: Never

## 2018-06-17 ENCOUNTER — Other Ambulatory Visit: Payer: Self-pay | Admitting: Cardiovascular Disease

## 2018-06-24 ENCOUNTER — Ambulatory Visit (INDEPENDENT_AMBULATORY_CARE_PROVIDER_SITE_OTHER): Payer: Medicare Other

## 2018-06-24 DIAGNOSIS — M25511 Pain in right shoulder: Secondary | ICD-10-CM | POA: Diagnosis not present

## 2018-06-24 DIAGNOSIS — M629 Disorder of muscle, unspecified: Secondary | ICD-10-CM | POA: Diagnosis not present

## 2018-06-24 DIAGNOSIS — I442 Atrioventricular block, complete: Secondary | ICD-10-CM

## 2018-06-24 DIAGNOSIS — M25611 Stiffness of right shoulder, not elsewhere classified: Secondary | ICD-10-CM | POA: Diagnosis not present

## 2018-06-24 DIAGNOSIS — R293 Abnormal posture: Secondary | ICD-10-CM | POA: Diagnosis not present

## 2018-06-26 NOTE — Progress Notes (Signed)
Remote pacemaker transmission.   

## 2018-06-27 DIAGNOSIS — M629 Disorder of muscle, unspecified: Secondary | ICD-10-CM | POA: Diagnosis not present

## 2018-06-27 DIAGNOSIS — M25611 Stiffness of right shoulder, not elsewhere classified: Secondary | ICD-10-CM | POA: Diagnosis not present

## 2018-06-27 DIAGNOSIS — M25511 Pain in right shoulder: Secondary | ICD-10-CM | POA: Diagnosis not present

## 2018-06-27 DIAGNOSIS — R293 Abnormal posture: Secondary | ICD-10-CM | POA: Diagnosis not present

## 2018-06-30 DIAGNOSIS — R293 Abnormal posture: Secondary | ICD-10-CM | POA: Diagnosis not present

## 2018-06-30 DIAGNOSIS — M25511 Pain in right shoulder: Secondary | ICD-10-CM | POA: Diagnosis not present

## 2018-06-30 DIAGNOSIS — M25611 Stiffness of right shoulder, not elsewhere classified: Secondary | ICD-10-CM | POA: Diagnosis not present

## 2018-06-30 DIAGNOSIS — M629 Disorder of muscle, unspecified: Secondary | ICD-10-CM | POA: Diagnosis not present

## 2018-07-02 DIAGNOSIS — R293 Abnormal posture: Secondary | ICD-10-CM | POA: Diagnosis not present

## 2018-07-02 DIAGNOSIS — M629 Disorder of muscle, unspecified: Secondary | ICD-10-CM | POA: Diagnosis not present

## 2018-07-02 DIAGNOSIS — M25611 Stiffness of right shoulder, not elsewhere classified: Secondary | ICD-10-CM | POA: Diagnosis not present

## 2018-07-02 DIAGNOSIS — M25511 Pain in right shoulder: Secondary | ICD-10-CM | POA: Diagnosis not present

## 2018-07-07 DIAGNOSIS — M25611 Stiffness of right shoulder, not elsewhere classified: Secondary | ICD-10-CM | POA: Diagnosis not present

## 2018-07-07 DIAGNOSIS — M629 Disorder of muscle, unspecified: Secondary | ICD-10-CM | POA: Diagnosis not present

## 2018-07-07 DIAGNOSIS — R293 Abnormal posture: Secondary | ICD-10-CM | POA: Diagnosis not present

## 2018-07-07 DIAGNOSIS — M25511 Pain in right shoulder: Secondary | ICD-10-CM | POA: Diagnosis not present

## 2018-07-09 DIAGNOSIS — R293 Abnormal posture: Secondary | ICD-10-CM | POA: Diagnosis not present

## 2018-07-09 DIAGNOSIS — M629 Disorder of muscle, unspecified: Secondary | ICD-10-CM | POA: Diagnosis not present

## 2018-07-09 DIAGNOSIS — M25611 Stiffness of right shoulder, not elsewhere classified: Secondary | ICD-10-CM | POA: Diagnosis not present

## 2018-07-09 DIAGNOSIS — M25511 Pain in right shoulder: Secondary | ICD-10-CM | POA: Diagnosis not present

## 2018-07-11 ENCOUNTER — Other Ambulatory Visit: Payer: Self-pay | Admitting: Cardiovascular Disease

## 2018-07-11 DIAGNOSIS — M7061 Trochanteric bursitis, right hip: Secondary | ICD-10-CM | POA: Diagnosis not present

## 2018-07-15 ENCOUNTER — Encounter: Payer: Self-pay | Admitting: Cardiovascular Disease

## 2018-07-15 ENCOUNTER — Ambulatory Visit (INDEPENDENT_AMBULATORY_CARE_PROVIDER_SITE_OTHER): Payer: Medicare Other | Admitting: Cardiovascular Disease

## 2018-07-15 DIAGNOSIS — I10 Essential (primary) hypertension: Secondary | ICD-10-CM

## 2018-07-15 DIAGNOSIS — I255 Ischemic cardiomyopathy: Secondary | ICD-10-CM

## 2018-07-15 DIAGNOSIS — I442 Atrioventricular block, complete: Secondary | ICD-10-CM

## 2018-07-15 DIAGNOSIS — E785 Hyperlipidemia, unspecified: Secondary | ICD-10-CM

## 2018-07-15 DIAGNOSIS — I739 Peripheral vascular disease, unspecified: Secondary | ICD-10-CM

## 2018-07-15 NOTE — Progress Notes (Signed)
07/15/2018 David [name]   09-27-1932  [id number]  Primary Physician Mayra Neer, MD Primary Cardiologist: Lorretta Harp MD [name], [place]  HPI:  David [name] is a 84 y.o.  married Caucasian male father of 3 children is retired Tree surgeon for a company. He was initially patient of Dr. Rush Landmark Gamble's followed by Dr. Terance Ice and now Dr. [name]. I last saw him in the office 06/26/2017. He has a history of peripheral arterial disease with known moderate right internal carotid artery stenosis but with ultrasound as well as an occluded left popliteal artery by angiography back in 2009. He has geniculate collaterals with three-vessel runoff and lifestyle limiting claudication. His other problems include a history of hypertension and hyperlipidemia. He had coronary bypass grafting 09/10/88 and has had 3 stent procedures since most recently by Dr. Ellouise Newer on 03/04/13 which time he placed a drug-eluting stent in the obtuse marginal vein graft. He denies chest pain or shortness of breath. He is currently displaying chronic rehabilitation. Because of ongoing claudication left greater than right as well as advances in technology it was decided to attempt percutaneous revascularization of the left popliteal chronic total occlusion.I attempted this on 08/31/2013 unsuccessfully. I then performed staged right SFA diamondback orbital rotational atherectomy, PT and stenting using an IDEV stent with excellent angiographic result. Followup Dopplers performed on 11/13/13 revealed an ABI 1.1 on the right. The procedure was complicated by a left common femoral pseudoaneurysm which was compressed and ultimately closed confirmed by ultrasound. I ultimately referred him to Dr. Brunetta Jeans at [facility] he was able to cross his left popliteal artery CTO successfully. Since that time he has had no claudication. He does have moderate right internal carotid artery stenosis was neurologically  asymptomatic. Dr. [name] follows him for his pacemaker as well.  Since I saw him 1 year ago he remained for vascularly stable. He denies claudication. Dopplers performed 08/13/2017 revealed a right ABI of 1.1 and a left ABI 0.84.  He did have monophasic waveforms in his left side suggesting the possibility of occlusion although he denies claudication.  He does get occasional effort angina when taking out the trash which is not changed in frequency or severity.   Current Meds  Medication Sig  . aspirin EC 81 MG tablet Take 81 mg by mouth daily.  . cetirizine (ZYRTEC) 10 MG tablet Take 10 mg by mouth every evening.   . cilostazol (PLETAL) 100 MG tablet TAKE 1 TABLET BY MOUTH TWO  TIMES DAILY  . Glucosamine-Chondroitin 750-600 MG TABS Take 1 tablet by mouth 2 (two) times daily.  Marland Kitchen ibuprofen (ADVIL,MOTRIN) 400 MG tablet Take 400 mg by mouth 2 (two) times daily.  Vanessa Kick Ethyl (VASCEPA) 1 g CAPS Take 2 capsules (2 g total) by mouth 2 (two) times daily with a meal.  . lisinopril (PRINIVIL,ZESTRIL) 20 MG tablet Take 20 mg by mouth every morning.   . metoprolol tartrate (LOPRESSOR) 50 MG tablet TAKE 1 AND 1/2 TABLETS BY  MOUTH TWO TIMES DAILY  . Multiple Vitamins-Minerals (SENIOR MULTIVITAMIN PLUS PO) Take 1 tablet by mouth daily.  . nitroGLYCERIN (NITROSTAT) 0.4 MG SL tablet Place 1 tablet (0.4 mg total) under the tongue every 5 (five) minutes as needed for chest pain.  . pantoprazole (PROTONIX) 40 MG tablet TAKE 1 TABLET BY MOUTH  DAILY  . rosuvastatin (CRESTOR) 40 MG tablet TAKE 1 TABLET BY MOUTH AT  BEDTIME     Allergies  Allergen Reactions  . Azithromycin Diarrhea  . Lactose Intolerance (Gi) Diarrhea  . Niacin And Related Diarrhea  . Sulfa Antibiotics Rash    Social History   Socioeconomic History  . Marital status: Married    Spouse name: Not on file  . Number of children: Not on file  . Years of education: Not on file  . Highest education level: Not on file  Occupational  History  . Not on file  Social Needs  . Financial resource strain: Not on file  . Food insecurity:    Worry: Not on file    Inability: Not on file  . Transportation needs:    Medical: Not on file    Non-medical: Not on file  Tobacco Use  . Smoking status: Former Smoker    Packs/day: 0.12    Years: 1.00    Pack years: 0.12    Types: Cigarettes    Last attempt to quit: 08/06/1956    Years since quitting: 61.9  . Smokeless tobacco: Never Used  Substance and Sexual Activity  . Alcohol use: No  . Drug use: No  . Sexual activity: Never  Lifestyle  . Physical activity:    Days per week: Not on file    Minutes per session: Not on file  . Stress: Not on file  Relationships  . Social connections:    Talks on phone: Not on file    Gets together: Not on file    Attends religious service: Not on file    Active member of club or organization: Not on file    Attends meetings of clubs or organizations: Not on file    Relationship status: Not on file  . Intimate partner violence:    Fear of current or ex partner: Not on file    Emotionally abused: Not on file    Physically abused: Not on file    Forced sexual activity: Not on file  Other Topics Concern  . Not on file  Social History Narrative  . Not on file     Review of Systems: General: negative for chills, fever, night sweats or weight changes.  Cardiovascular: negative for chest pain, dyspnea on exertion, edema, orthopnea, palpitations, paroxysmal nocturnal dyspnea or shortness of breath Dermatological: negative for rash Respiratory: negative for cough or wheezing Urologic: negative for hematuria Abdominal: negative for nausea, vomiting, diarrhea, bright red blood per rectum, melena, or hematemesis Neurologic: negative for visual changes, syncope, or dizziness All other systems reviewed and are otherwise negative except as noted above.    Blood pressure 130/62, pulse 67, height 5\' 9"  (1.753 m), weight 202 lb (91.6 kg).    General appearance: alert and no distress Neck: no adenopathy, no JVD, supple, symmetrical, trachea midline, thyroid not enlarged, symmetric, no tenderness/mass/nodules and Bilateral carotid bruits Lungs: clear to auscultation bilaterally Heart: regular rate and rhythm, S1, S2 normal, no murmur, click, rub or gallop Extremities: extremities normal, atraumatic, no cyanosis or edema Pulses: 2+ and symmetric Skin: Skin color, texture, turgor normal. No rashes or lesions Neurologic: Alert and oriented X 3, normal strength and tone. Normal symmetric reflexes. Normal coordination and gait  EKG ventricular paced rhythm at 67.  I Personally reviewed this EKG  ASSESSMENT AND PLAN:   Complete heart block- [facility] pacemaker '95 with Gen change '02, '09. New leads 2017. Pacer dependednt History of complete heart block status post remote pacemaker insertion by Dr. Rollene Fare followed by Dr. [name]  PVD - Lt popliteal occlusion with collaterals '09; rt  popliteal successful diamondback orbital rotational atherectomy PTA and stent using an IDEV stent 10/21/13 History of peripheral arterial disease status post right SFA diamondback orbital rotational atherectomy, PTA and stenting using ID EV stent with an excellent result by myself in 2015.  I unsuccessfully crossed a left popliteal popliteal occlusion and ultimately referred him to Dr. Brunetta Jeans he was able to cross this successfully.  He currently denies claudication.  His recent Dopplers performed 08/12/2017 revealed a right ABI of 1.1 and a left of 0.84.  The right SFA appeared patent although the left SFA had monophasic waveforms suggesting occlusion of the popliteal artery.  CAD s/p CABG 1995, s/p stent SVG-OM 2007 and July 2014 History of CAD status post remote coronary artery bypass grafting 09/10/1988 with subsequent stenting procedures most recently by Dr. Ellouise Newer 03/04/2013 which time he place a drug-eluting stent in the obtuse marginal branch vein  graft.  He gets occasional effort angina when taking his trash out but this is not changed in frequency or severity.  Dyslipidemia History of hyperlipidemia on statin therapy with lipid profile performed 06/04/2018 revealing a total cholesterol 129, LDL 59 and HDL 40.  HTN (hypertension) History of essential hypertension her blood pressure measured today 130/62.  He is on lisinopril and metoprolol.  Continue current meds at current dosing.      Lorretta Harp MD FACP,FACC,FAHA, [facility] 07/15/2018 9:40 AM

## 2018-07-15 NOTE — Assessment & Plan Note (Signed)
History of complete heart block status post remote pacemaker insertion by Dr. Rollene Fare followed by Dr. [name]

## 2018-07-15 NOTE — Patient Instructions (Signed)
Medication Instructions:  Your physician recommends that you continue on your current medications as directed. Please refer to the Current Medication list given to you today.  If you need a refill on your cardiac medications before your next appointment, please call your pharmacy.   Lab work: NONE If you have labs (blood work) drawn today and your tests are completely normal, you will receive your results only by: Marland Kitchen MyChart Message (if you have MyChart) OR . A paper copy in the mail If you have any lab test that is abnormal or we need to change your treatment, we will call you to review the results.  Testing/Procedures: AS SCHEDULED  Follow-Up: At [facility], you and your health needs are our priority.  As part of our continuing mission to provide you with exceptional heart care, we have created designated Provider Care Teams.  These Care Teams include your primary Cardiologist (physician) and Advanced Practice Providers (APPs -  Physician Assistants and Nurse Practitioners) who all work together to provide you with the care you need, when you need it. You will need a follow up appointment in 12 months.  Please call our office 2 months in advance to schedule this appointment.  You may see DR. BERRY or one of the following Advanced Practice Providers on your designated Care Team:   Kerin Ransom, PA-C [place], [place] . Sande Rives, PA-C

## 2018-07-15 NOTE — Assessment & Plan Note (Signed)
History of peripheral arterial disease status post right SFA diamondback orbital rotational atherectomy, PTA and stenting using ID EV stent with an excellent result by myself in 2015.  I unsuccessfully crossed a left popliteal popliteal occlusion and ultimately referred him to Dr. Brunetta Jeans he was able to cross this successfully.  He currently denies claudication.  His recent Dopplers performed 08/12/2017 revealed a right ABI of 1.1 and a left of 0.84.  The right SFA appeared patent although the left SFA had monophasic waveforms suggesting occlusion of the popliteal artery.

## 2018-07-15 NOTE — Assessment & Plan Note (Signed)
History of essential hypertension her blood pressure measured today 130/62.  He is on lisinopril and metoprolol.  Continue current meds at current dosing.

## 2018-07-15 NOTE — Assessment & Plan Note (Signed)
History of hyperlipidemia on statin therapy with lipid profile performed 06/04/2018 revealing a total cholesterol 129, LDL 59 and HDL 40.

## 2018-07-15 NOTE — Assessment & Plan Note (Signed)
History of CAD status post remote coronary artery bypass grafting 09/10/1988 with subsequent stenting procedures most recently by Dr. Ellouise Newer 03/04/2013 which time he place a drug-eluting stent in the obtuse marginal branch vein graft.  He gets occasional effort angina when taking his trash out but this is not changed in frequency or severity.

## 2018-07-16 DIAGNOSIS — R293 Abnormal posture: Secondary | ICD-10-CM | POA: Diagnosis not present

## 2018-07-16 DIAGNOSIS — M25511 Pain in right shoulder: Secondary | ICD-10-CM | POA: Diagnosis not present

## 2018-07-16 DIAGNOSIS — M629 Disorder of muscle, unspecified: Secondary | ICD-10-CM | POA: Diagnosis not present

## 2018-07-16 DIAGNOSIS — M25611 Stiffness of right shoulder, not elsewhere classified: Secondary | ICD-10-CM | POA: Diagnosis not present

## 2018-07-18 DIAGNOSIS — R293 Abnormal posture: Secondary | ICD-10-CM | POA: Diagnosis not present

## 2018-07-18 DIAGNOSIS — M25611 Stiffness of right shoulder, not elsewhere classified: Secondary | ICD-10-CM | POA: Diagnosis not present

## 2018-07-18 DIAGNOSIS — M25511 Pain in right shoulder: Secondary | ICD-10-CM | POA: Diagnosis not present

## 2018-07-18 DIAGNOSIS — M629 Disorder of muscle, unspecified: Secondary | ICD-10-CM | POA: Diagnosis not present

## 2018-07-21 DIAGNOSIS — M25611 Stiffness of right shoulder, not elsewhere classified: Secondary | ICD-10-CM | POA: Diagnosis not present

## 2018-07-21 DIAGNOSIS — R293 Abnormal posture: Secondary | ICD-10-CM | POA: Diagnosis not present

## 2018-07-21 DIAGNOSIS — M25511 Pain in right shoulder: Secondary | ICD-10-CM | POA: Diagnosis not present

## 2018-07-21 DIAGNOSIS — M629 Disorder of muscle, unspecified: Secondary | ICD-10-CM | POA: Diagnosis not present

## 2018-07-23 DIAGNOSIS — M25511 Pain in right shoulder: Secondary | ICD-10-CM | POA: Diagnosis not present

## 2018-07-23 DIAGNOSIS — R293 Abnormal posture: Secondary | ICD-10-CM | POA: Diagnosis not present

## 2018-07-23 DIAGNOSIS — M25611 Stiffness of right shoulder, not elsewhere classified: Secondary | ICD-10-CM | POA: Diagnosis not present

## 2018-07-23 DIAGNOSIS — M629 Disorder of muscle, unspecified: Secondary | ICD-10-CM | POA: Diagnosis not present

## 2018-08-04 DIAGNOSIS — M629 Disorder of muscle, unspecified: Secondary | ICD-10-CM | POA: Diagnosis not present

## 2018-08-04 DIAGNOSIS — M25611 Stiffness of right shoulder, not elsewhere classified: Secondary | ICD-10-CM | POA: Diagnosis not present

## 2018-08-04 DIAGNOSIS — M25511 Pain in right shoulder: Secondary | ICD-10-CM | POA: Diagnosis not present

## 2018-08-04 DIAGNOSIS — R293 Abnormal posture: Secondary | ICD-10-CM | POA: Diagnosis not present

## 2018-08-12 DIAGNOSIS — M7061 Trochanteric bursitis, right hip: Secondary | ICD-10-CM | POA: Diagnosis not present

## 2018-08-20 LAB — CUP PACEART REMOTE DEVICE CHECK
Battery Remaining Longevity: 126 mo
Brady Statistic RA Percent Paced: 68 %
Brady Statistic RV Percent Paced: 99 %
Date Time Interrogation Session: 20191119072100
Implantable Lead Location: 753859
Implantable Lead Location: 753860
Implantable Lead Serial Number: 720495
Implantable Lead Serial Number: 763888
Implantable Pulse Generator Implant Date: 20170425
Lead Channel Setting Pacing Amplitude: 2.5 V
Lead Channel Setting Pacing Pulse Width: 0.4 ms
Lead Channel Setting Sensing Sensitivity: 2.5 mV
MDC IDC LEAD IMPLANT DT: 20170425
MDC IDC LEAD IMPLANT DT: 20170425
MDC IDC MSMT BATTERY REMAINING PERCENTAGE: 100 %
MDC IDC MSMT LEADCHNL RA IMPEDANCE VALUE: 709 Ohm
MDC IDC MSMT LEADCHNL RV IMPEDANCE VALUE: 861 Ohm
MDC IDC MSMT LEADCHNL RV PACING THRESHOLD AMPLITUDE: 0.7 V
MDC IDC MSMT LEADCHNL RV PACING THRESHOLD PULSEWIDTH: 0.4 ms
MDC IDC PG SERIAL: 718025
MDC IDC SET LEADCHNL RV PACING AMPLITUDE: 1.2 V

## 2018-08-22 ENCOUNTER — Ambulatory Visit (HOSPITAL_COMMUNITY)
Admission: RE | Admit: 2018-08-22 | Discharge: 2018-08-22 | Disposition: A | Discharge status: Home / Self Care | Payer: Medicare Other | Source: Ambulatory Visit | Attending: Internal Medicine | Admitting: Internal Medicine

## 2018-08-22 ENCOUNTER — Ambulatory Visit (HOSPITAL_BASED_OUTPATIENT_CLINIC_OR_DEPARTMENT_OTHER)
Admission: RE | Admit: 2018-08-22 | Discharge: 2018-08-22 | Disposition: A | Discharge status: Home / Self Care | Payer: Medicare Other | Source: Ambulatory Visit | Attending: Cardiovascular Disease | Admitting: Cardiovascular Disease

## 2018-08-22 DIAGNOSIS — I739 Peripheral vascular disease, unspecified: Secondary | ICD-10-CM | POA: Insufficient documentation

## 2018-08-22 DIAGNOSIS — I6529 Occlusion and stenosis of unspecified carotid artery: Secondary | ICD-10-CM | POA: Diagnosis not present

## 2018-09-04 LAB — CUP PACEART INCLINIC DEVICE CHECK
Date Time Interrogation Session: 20200130140816
Implantable Lead Implant Date: 20170425
Implantable Lead Location: 753860
Implantable Lead Model: 7741
Implantable Lead Serial Number: 720495
MDC IDC LEAD IMPLANT DT: 20170425
MDC IDC LEAD LOCATION: 753859
MDC IDC LEAD SERIAL: 763888
MDC IDC PG IMPLANT DT: 20170425
MDC IDC PG SERIAL: 718025

## 2018-09-16 ENCOUNTER — Ambulatory Visit (INDEPENDENT_AMBULATORY_CARE_PROVIDER_SITE_OTHER): Payer: Medicare Other | Admitting: Cardiovascular Disease

## 2018-09-16 ENCOUNTER — Encounter: Payer: Self-pay | Admitting: Cardiovascular Disease

## 2018-09-16 VITALS — BP 136/64 | HR 60 | Ht 69.5 in | Wt 201.6 lb

## 2018-09-16 DIAGNOSIS — I6521 Occlusion and stenosis of right carotid artery: Secondary | ICD-10-CM | POA: Diagnosis not present

## 2018-09-16 DIAGNOSIS — I739 Peripheral vascular disease, unspecified: Secondary | ICD-10-CM | POA: Diagnosis not present

## 2018-09-16 DIAGNOSIS — I6529 Occlusion and stenosis of unspecified carotid artery: Secondary | ICD-10-CM | POA: Diagnosis not present

## 2018-09-16 NOTE — Assessment & Plan Note (Signed)
History of carotid artery disease with Dopplers performed 08/22/2018 revealing asymptomatic moderate right ICA stenosis with normal left internal carotid artery.  We will continue to follow this on annual basis.

## 2018-09-16 NOTE — Progress Notes (Signed)
David [name] returns today for follow-up of his arterial Doppler studies performed 08/22/2018.  His carotid Dopplers revealed stable moderate right ICA stenosis.  His lower extremity Dopplers revealed a right ABI of 1.03 and a left of 0.80.  His left popliteal stent appears occluded although he denies claudication.  Lorretta Harp, M.D., [place], [facility], [place] [address] [place], [place]  [address]  [phone] 09/16/2018 10:25 AM

## 2018-09-16 NOTE — Patient Instructions (Signed)
Medication Instructions:  Your physician recommends that you continue on your current medications as directed. Please refer to the Current Medication list given to you today. If you need a refill on your cardiac medications before your next appointment, please call your pharmacy.   Lab work: NONE If you have labs (blood work) drawn today and your tests are completely normal, you will receive your results only by: Marland Kitchen MyChart Message (if you have MyChart) OR . A paper copy in the mail If you have any lab test that is abnormal or we need to change your treatment, we will call you to review the results.  Testing/Procedures: Your physician has requested that you have a lower or upper extremity arterial duplex. This test is an ultrasound of the arteries in the legs or arms. It looks at arterial blood flow in the legs and arms. Allow one hour for Lower and Upper Arterial scans. There are no restrictions or special instructions  SCHEDULE January 2021  Your physician has requested that you have an ankle brachial index (ABI). During this test an ultrasound and blood pressure cuff are used to evaluate the arteries that supply the arms and legs with blood. Allow thirty minutes for this exam. There are no restrictions or special instructions.  SCHEDULE January 2021  Your physician has requested that you have a carotid duplex. This test is an ultrasound of the carotid arteries in your neck. It looks at blood flow through these arteries that supply the brain with blood. Allow one hour for this exam. There are no restrictions or special instructions.  SCHEDULE January 2021   Follow-Up: At [facility], you and your health needs are our priority.  As part of our continuing mission to provide you with exceptional heart care, we have created designated Provider Care Teams.  These Care Teams include your primary Cardiologist (physician) and Advanced Practice Providers (APPs -  Physician Assistants and Nurse  Practitioners) who all work together to provide you with the care you need, when you need it. . You will need a follow up appointment in 12 months.  Please call our office 2 months in advance to schedule this appointment.  You may see Dr. Gwenlyn Found or one of the following Advanced Practice Providers on your designated Care Team:   . Kerin Ransom, [place] . Almyra Deforest, PA-C . Fabian Sharp, PA-C . Jory Sims, DNP . Rosaria Ferries, PA-C . Roby Lofts, PA-C . Sande Rives, PA-C

## 2018-09-16 NOTE — Assessment & Plan Note (Signed)
Drugs had recent Doppler studies performed in our office 08/22/2018 revealing a right ABI of 1.03 and a left of 0.80.  Previously placed stent in his left popliteal artery appears to be occluded although he denies claudication.  His right lower extremity SFA stent is widely patent.  We will continue to follow this on annual basis.

## 2018-09-22 ENCOUNTER — Telehealth: Payer: Self-pay | Admitting: Cardiovascular Disease

## 2018-09-22 NOTE — Telephone Encounter (Signed)
I advised pt that our [facility] will send him a cell adaptor if he wants to get rid of his land line. I verified the pt address and telephone number to give to the representative.

## 2018-09-22 NOTE — Telephone Encounter (Signed)
New Message     1. Has your device fired? no  2. Is you device beeping? no  3. Are you experiencing draining or swelling at device site? no  4. Are you calling to see if we received your device transmission? no  5. Have you passed out? No    Patient is calling to see if he needs a landline phone in order to do a remote transmission. Please call.     Please route to Device Clinic Pool    .

## 2018-09-23 ENCOUNTER — Ambulatory Visit (INDEPENDENT_AMBULATORY_CARE_PROVIDER_SITE_OTHER): Payer: Medicare Other

## 2018-09-23 DIAGNOSIS — I442 Atrioventricular block, complete: Secondary | ICD-10-CM

## 2018-09-24 LAB — CUP PACEART REMOTE DEVICE CHECK
Battery Remaining Percentage: 100 %
Brady Statistic RA Percent Paced: 68 %
Brady Statistic RV Percent Paced: 99 %
Implantable Lead Location: 753860
Implantable Lead Model: 7742
Implantable Lead Serial Number: 720495
Implantable Lead Serial Number: 763888
Lead Channel Impedance Value: 727 Ohm
Lead Channel Impedance Value: 820 Ohm
Lead Channel Setting Pacing Amplitude: 1.2 V
Lead Channel Setting Pacing Amplitude: 2.5 V
Lead Channel Setting Sensing Sensitivity: 2.5 mV
MDC IDC LEAD IMPLANT DT: 20170425
MDC IDC LEAD IMPLANT DT: 20170425
MDC IDC LEAD LOCATION: 753859
MDC IDC MSMT BATTERY REMAINING LONGEVITY: 126 mo
MDC IDC MSMT LEADCHNL RV PACING THRESHOLD AMPLITUDE: 0.7 V
MDC IDC MSMT LEADCHNL RV PACING THRESHOLD PULSEWIDTH: 0.4 ms
MDC IDC PG IMPLANT DT: 20170425
MDC IDC SESS DTM: 20200218072100
MDC IDC SET LEADCHNL RV PACING PULSEWIDTH: 0.4 ms
Pulse Gen Serial Number: 718025

## 2018-10-01 NOTE — Progress Notes (Signed)
Remote pacemaker transmission.   

## 2018-10-06 ENCOUNTER — Encounter: Payer: Self-pay | Admitting: Cardiology

## 2018-10-14 DIAGNOSIS — M7061 Trochanteric bursitis, right hip: Secondary | ICD-10-CM | POA: Diagnosis not present

## 2018-10-20 DIAGNOSIS — M7061 Trochanteric bursitis, right hip: Secondary | ICD-10-CM | POA: Diagnosis not present

## 2018-10-22 DIAGNOSIS — M7061 Trochanteric bursitis, right hip: Secondary | ICD-10-CM | POA: Diagnosis not present

## 2018-10-27 DIAGNOSIS — M7061 Trochanteric bursitis, right hip: Secondary | ICD-10-CM | POA: Diagnosis not present

## 2018-12-23 ENCOUNTER — Ambulatory Visit (INDEPENDENT_AMBULATORY_CARE_PROVIDER_SITE_OTHER): Payer: Medicare Other | Admitting: *Deleted

## 2018-12-23 ENCOUNTER — Other Ambulatory Visit: Payer: Self-pay

## 2018-12-23 DIAGNOSIS — I442 Atrioventricular block, complete: Secondary | ICD-10-CM

## 2018-12-24 LAB — CUP PACEART REMOTE DEVICE CHECK
Battery Remaining Longevity: 120 mo
Battery Remaining Percentage: 100 %
Brady Statistic RA Percent Paced: 68 %
Brady Statistic RV Percent Paced: 98 %
Date Time Interrogation Session: 20200519062100
Implantable Lead Implant Date: 20170425
Implantable Lead Implant Date: 20170425
Implantable Lead Location: 753859
Implantable Lead Location: 753860
Implantable Lead Model: 7741
Implantable Lead Model: 7742
Implantable Lead Serial Number: 720495
Implantable Lead Serial Number: 763888
Implantable Pulse Generator Implant Date: 20170425
Lead Channel Impedance Value: 711 Ohm
Lead Channel Impedance Value: 812 Ohm
Lead Channel Pacing Threshold Amplitude: 0.8 V
Lead Channel Pacing Threshold Pulse Width: 0.4 ms
Lead Channel Setting Pacing Amplitude: 1.3 V
Lead Channel Setting Pacing Amplitude: 2.5 V
Lead Channel Setting Pacing Pulse Width: 0.4 ms
Lead Channel Setting Sensing Sensitivity: 2.5 mV
Pulse Gen Serial Number: 718025

## 2019-01-01 ENCOUNTER — Encounter: Payer: Self-pay | Admitting: Cardiology

## 2019-01-01 NOTE — Progress Notes (Signed)
Remote pacemaker transmission.   

## 2019-01-07 ENCOUNTER — Encounter: Payer: Self-pay | Admitting: Cardiovascular Disease

## 2019-01-07 ENCOUNTER — Telehealth (INDEPENDENT_AMBULATORY_CARE_PROVIDER_SITE_OTHER): Payer: Medicare Other | Admitting: Cardiovascular Disease

## 2019-01-07 VITALS — BP 124/77 | HR 60 | Ht 69.5 in | Wt 198.0 lb

## 2019-01-07 DIAGNOSIS — I255 Ischemic cardiomyopathy: Secondary | ICD-10-CM | POA: Diagnosis not present

## 2019-01-07 DIAGNOSIS — I471 Supraventricular tachycardia: Secondary | ICD-10-CM

## 2019-01-07 DIAGNOSIS — I10 Essential (primary) hypertension: Secondary | ICD-10-CM | POA: Diagnosis not present

## 2019-01-07 DIAGNOSIS — I4729 Other ventricular tachycardia: Secondary | ICD-10-CM

## 2019-01-07 DIAGNOSIS — I25718 Atherosclerosis of autologous vein coronary artery bypass graft(s) with other forms of angina pectoris: Secondary | ICD-10-CM

## 2019-01-07 DIAGNOSIS — Z950 Presence of cardiac pacemaker: Secondary | ICD-10-CM | POA: Diagnosis not present

## 2019-01-07 DIAGNOSIS — I472 Ventricular tachycardia: Secondary | ICD-10-CM | POA: Diagnosis not present

## 2019-01-07 DIAGNOSIS — E785 Hyperlipidemia, unspecified: Secondary | ICD-10-CM

## 2019-01-07 DIAGNOSIS — I6521 Occlusion and stenosis of right carotid artery: Secondary | ICD-10-CM

## 2019-01-07 DIAGNOSIS — I739 Peripheral vascular disease, unspecified: Secondary | ICD-10-CM | POA: Diagnosis not present

## 2019-01-07 DIAGNOSIS — I442 Atrioventricular block, complete: Secondary | ICD-10-CM

## 2019-01-07 NOTE — Progress Notes (Signed)
Virtual Visit via Video Note   This visit type was conducted due to national recommendations for restrictions regarding the COVID-19 Pandemic (e.g. social distancing) in an effort to limit this patient's exposure and mitigate transmission in our community.  Due to his co-morbid illnesses, this patient is at least at moderate risk for complications without adequate follow up.  This format is felt to be most appropriate for this patient at this time.  All issues noted in this document were discussed and addressed.  A limited physical exam was performed with this format.  Please refer to the patient's chart for his consent to telehealth for [facility].   Date:  01/07/2019   ID:  KATELYN KOHLMEYER, DOB August 15, 1932, MRN [id number]  Patient Location: Home Provider Location: Home  PCP:  Mayra Neer, MD  Cardiologist:  Daiva Nakayama Electrophysiologist:  None   Evaluation Performed:  Follow-Up Visit  Chief Complaint:  Pacemaker follow up, CAD  History of Present Illness:    David [name] is a 85 y.o. male with CHB s/p pacemaker, CAD s/p CABG and PCI-SVG, PAD, NSVT detected on pacemaker check, NHLymphoma in remission.  Pacemaker interrogation shows 10 years of estimated generator longevity. He has 98% ventricular pacing (rest PVCs) and has 51% atrial pacing. Pacemaker dependent, no detectable R waves. He has had very rare paroxysmal atrial tachycardia. He has not had a single episode of nonsustained ventricular tachycardia in the last 3 months, but has had sporadic nonsustained VT in the past.  Mr. Guess has had numerous coronary revascularization procedures, most recently a stent in the saphenous vein graft to the oblique marginal artery in July 2014. He is now almost 20 years status post coronary bypass surgery. The same bypass graft had received a stent in 2007. He has mild ischemic cardiomyopathy with an ejection fraction of 40-45%, without clinical  heart failure. The nuclear stress test on  July 28, 2015 shows old scar in the apex and inferior wall. LVEF is calculated at 43% (identical to 2014 and 2015).  He had occlusion of the left popliteal artery with distal reconstitution via collaterals. He had successful recanalization with placement of 2 stents by Dr. Andree Elk in 2015 . This led to marked improvement in his claudication, but this then subsequently occluded He is pacemaker dependent secondary to complete heart block. He had a generator changeout in 2017. Interrogation of his device today again shows some issues with "noise" on the atrial channel but none on the ventricular channel. Overall device function is normal and he has virtually 100% atrial pacing as well as 100% ventricular pacing.  Moderate carotid artery disease (stable 60-79% proximal internal carotid artery stenosis on the right, mild plaque on the left), last duplex US was February 11, 2015.  He has a history of non-Hodgkin's lymphoma with a large neck mass but also mediastinal and abdominal lymphadenopathy treated with radiotherapy to the cervical area in 2011 followed by chemotherapy with Rituxan and fludarabine. He is considered to be in remission.   The patient does not have symptoms concerning for COVID-19 infection (fever, chills, cough, or new shortness of breath).    Past Medical History:  Diagnosis Date  . Arthritis    "hands" (10/19/2013)  . Bladder tumor   . CAD (coronary artery disease)    cardiologist-  dr crouitoru  . Carotid artery disease (HCC)    moderate pRICA 73-71%,  0-62% LICA , >69% RECA last duplex 08-05-2015  . Cataract of both eyes   .  Complete heart block ([place])   . GERD (gastroesophageal reflux disease)   . Glaucoma, both eyes   . Hematuria   . History of MI (myocardial infarction)    1989  . Hyperlipidemia   . Hypertension   . Ischemic cardiomyopathy    ef 40-45%  without clinical heart failure per cardiology note 12-28-2015 (nuclear stress ef 43% from 07-28-2015)  . Left  ventricular dysfunction   . Non Hodgkin's lymphoma ([place]) dx 06/2010---  oncologist-  dr Benay Spice---  in clinical remission since 2013   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 -- lymphadenopathy to neck, mediastinal , and abdominal--  chemotherapy completed 06/ 2011  . NSVT (nonsustained ventricular tachycardia) [facility]) cardiologist-  dr crouitoru   Max rate 200 bpm, 28-beat, nearly 9-second episode recorded by pacemaker on November 15, asymptomatic   . Pacemaker pacemaker dependant due to CHB   1st Pacemaker Placement 1998 w/ New generator  12-31-2000 ; 08-04-2008;  11-29-2015  . PAD (peripheral artery disease) ([place])    followed by dr berry--- last duplex 08-05-2015  patents bilateral popliteal stents, bilateral SFA 30-49% stenosis  . Peripheral vascular disease with claudication ([place])    dx 2009 left popliteal occlusion with collaterals; 03- 2015  s/p  stenting right popliteal  by dr berry and left popliteal stenting by dr Andree Elk ([facility]) july 2015  . S/P CABG x 5    09-10-1988  . S/P drug eluting coronary stent placement 01/04/2006   01-04-2006--- DES to SVG to OM1, OM2, and IR, and SVG to PDA(x2 grafts)  and 03-04-2013  DES to SVR to OM  . Wears glasses    Past Surgical History:  Procedure Laterality Date  . ANAL FISSURE REPAIR  12-09-2000 and 1970's  . CARDIAC CATHETERIZATION  04/09/2000   patent grafts  . CARDIAC PACEMAKER PLACEMENT  11-15-1993   dr Cyndia Bent  . CARDIOVASCULAR STRESS TEST  07-28-2015  dr crouitoru   Intermediate risk nuclear study w/ large, severe, predominantly fixed inferior and apical defect; minimal reversibility inferior lateral wall; findings consistent w/ large prior infarct and trivial peri-infarct ischemia;  ef 43% w/ inferior hypokinesis and apical akinesis; mild LVE  . CORONARY ANGIOPLASTY WITH STENT PLACEMENT  01-04-2006  dr gamble   DES to pSVG to OM1, SVG to OM2 &  IR, and mSVG to RCA dPDA (x2 grafts );  pLAD , pLCFX and  mRCA  occluded;  LIMA to LAD mid-distal patent with occlusion beyond LIMA with collateral flow;  ef 30%  . CORONARY ARTERY BYPASS GRAFT  09/10/1988   dr Redmond Pulling   "CABG X5" (03/04/2013) LIMA to LAD,SVG to imtermediate OM1 OM II,SVG to PDA  . CYSTOSCOPY W/ RETROGRADES Bilateral 04/13/2016   Procedure: CYSTOSCOPY WITH RETROGRADE PYELOGRAM;  Surgeon: Alexis Frock, MD;  Location: [facility];  Service: Urology;  Laterality: Bilateral;  . EP IMPLANTABLE DEVICE N/A 11/29/2015   Procedure: Pacemaker Implant;  Surgeon: Sanda Klein, MD;  Location: [facility];  Service: Cardiovascular;  Laterality: N/A;  new generator and new atrial, vertricular leads;  Patent attorney  . LEFT HEART CATHETERIZATION WITH CORONARY/GRAFT ANGIOGRAM N/A 03/04/2013   Procedure: LEFT HEART CATHETERIZATION WITH Beatrix Fetters;  Surgeon: Troy Sine, MD;  Location: [facility] CATH LAB;  Service: Cardiovascular;  Laterality: N/A;  . LOWER EXTREMITY ANGIOGRAM N/A 08/31/2013   Procedure: LOWER EXTREMITY ANGIOGRAM;  Surgeon: Lorretta Harp, MD;  Location: [facility] CATH LAB;  Service: Cardiovascular;  Laterality: N/A;  . PACEMAKER  REPLACEMENT  12/31/2000;  08-04-2008  . PERCUTANEOUS CORONARY STENT INTERVENTION (PCI-S)  03/04/2013   Procedure: PERCUTANEOUS CORONARY STENT INTERVENTION (PCI-S);  Surgeon: Troy Sine, MD;  Location: [facility] CATH LAB;  Service: Cardiovascular;;  DES to S-OM for in-stent restenosis  . POPLITEAL ARTERY STENT Right 10-21-2013  dr berry   atherectomy, PTA , and IDEV stent x1  . POPLITEAL ARTERY STENT Left 07/ 2015  dr Andree Elk at [facility]   angioplasty / stent x2  . 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 SUPERFICIAL LYMPH NODE BIOPSY / EXCISION Left 2011   cervical lymph node  . TRANSURETHRAL RESECTION OF BLADDER TUMOR N/A 04/13/2016   Procedure: TRANSURETHRAL RESECTION OF BLADDER TUMOR (TURBT) with clot evacuation;  Surgeon: Alexis Frock, MD;  Location: [facility];  Service: Urology;  Laterality: N/A;  . VENOGRAM Left 11/29/2015   Procedure: Venogram;  Surgeon: Sanda Klein, MD;  Location: [facility];  Service: Cardiovascular;  Laterality: Left;     Current Meds  Medication Sig  . aspirin EC 81 MG tablet Take 81 mg by mouth daily.  . cetirizine (ZYRTEC) 10 MG tablet Take 10 mg by mouth every evening.   . cilostazol (PLETAL) 100 MG tablet TAKE 1 TABLET BY MOUTH TWO  TIMES DAILY  . Glucosamine-Chondroitin 750-600 MG TABS Take 1 tablet by mouth 2 (two) times daily.  Vanessa Kick Ethyl (VASCEPA) 1 g CAPS Take 2 capsules (2 g total) by mouth 2 (two) times daily with a meal.  . lisinopril (PRINIVIL,ZESTRIL) 20 MG tablet Take 20 mg by mouth every morning.   . metoprolol tartrate (LOPRESSOR) 50 MG tablet TAKE 1 AND 1/2 TABLETS BY  MOUTH TWO TIMES DAILY  . Multiple Vitamins-Minerals (SENIOR MULTIVITAMIN PLUS PO) Take 1 tablet by mouth daily.  . nitroGLYCERIN (NITROSTAT) 0.4 MG SL tablet Place 1 tablet (0.4 mg total) under the tongue every 5 (five) minutes as needed for chest pain.  . pantoprazole (PROTONIX) 40 MG tablet TAKE 1 TABLET BY MOUTH  DAILY  . rosuvastatin (CRESTOR) 40 MG tablet TAKE 1 TABLET BY MOUTH AT  BEDTIME     Allergies:   Azithromycin; Lactose intolerance (gi); Niacin and related; and Sulfa antibiotics   Social History   Tobacco Use  . Smoking status: Former Smoker    Packs/day: 0.12    Years: 1.00    Pack years: 0.12    Types: Cigarettes    Last attempt to quit: 08/06/1956    Years since quitting: 62.4  . Smokeless tobacco: Never Used  Substance Use Topics  . Alcohol use: No  . Drug use: No     Family Hx: The patient's family history includes Diabetes in his mother; Heart attack in his father; Hyperlipidemia in his sister and sister; Hypertension in his sister and sister; Stroke in his mother.  ROS:   Please see the history of present illness.     All other systems reviewed and are negative.   Prior  CV studies:   The following studies were reviewed today: January 2020 - carotid Doppler; unchanged moderate R ICA stenosis; - LEA Doppler L ABI 0.8 (occluded popliteal stent).  Labs/Other Tests and Data Reviewed:    EKG:  An ECG dated 07/15/2018 was personally reviewed today and demonstrated:  A sensed, V paced  Recent Labs: 02/14/2018: Hemoglobin 14.4; Platelets 111  June 04, 2018 (Dr. Brigitte Pulse) creatinine 1.19, potassium 4.1, normal liver function tests  Recent Lipid Panel Lab Results  Component Value Date/Time   CHOL 119 06/24/2014 08:55 AM   TRIG 185 (H) 06/24/2014 08:55 AM   HDL 35 (L) 06/24/2014 08:55 AM   CHOLHDL 3.4 06/24/2014 08:55 AM   LDLCALC 47 06/24/2014 08:55 AM   June 04, 2018 total cholesterol 129, LDL 59, HDL 40, triglycerides 146  Wt Readings from Last 3 Encounters:  01/07/19 198 lb (89.8 kg)  09/16/18 201 lb 9.6 oz (91.4 kg)  07/15/18 202 lb (91.6 kg)     Objective:    Vital Signs:  BP 124/77   Pulse 60   Ht 5' 9.5" (1.765 m)   Wt 198 lb (89.8 kg)   BMI 28.82 kg/m    VITAL SIGNS:  reviewed GEN:  no acute distress EYES:  sclerae anicteric, EOMI - Extraocular Movements Intact RESPIRATORY:  normal respiratory effort, symmetric expansion CARDIOVASCULAR:  no peripheral edema SKIN:  no rash, lesions or ulcers. MUSCULOSKELETAL:  no obvious deformities. NEURO:  alert and oriented x 3, no obvious focal deficit PSYCH:  normal affect  ASSESSMENT & PLAN:    1. Complete heart block: pacemaker dependent. 2. PM:  Reviewed recent pacemaker download from 03/26/2019.  New generator new leads implanted in 2017.  Normal device function. Remote download every 3 months and yearly office visit 3. CAD s/p CABG 1995, s/p stent SVG-OM 2007 and July 2014:  Continues to have an unchanged pattern of CCS class II exertional angina.  He had a low risk nuclear study in December 2016.  Offered additional antianginal medications, but the symptoms are really not troublesome enough  for him to take more medicines.  On aspirin and high-dose statin. 4. Cardiomyopathy, ischemic - EF 43%  he remains clinically euvolemic, NYHA functional class 1- 2 without requiring loop diuretics.  No clinical evidence of congestive heart failure. On appropriate therapy with beta blockers and ace inhibitors. No indication for CRT or Entresto at this point. 5. HTN:  Very well controlled 6. NSVT (nonsustained ventricular tachycardia):  On current dose of beta-blocker he has a lower burden of PVCs and has not had nonsustained VT in a while. 7. Asymptomatic stenosis of right carotid artery without infarction: stable moderate stenosis by repeat carotid duplex ultrasound in January 2020. Will recheck at one year intervals. 8. PAD - Lt popliteal occlusion, successfully revascularized but with subsequent stent occlusion.  Left ABI 0.8, but claudication no longer bothersome, probably due to collateral formation. He does have claudication but this is not lifestyle limiting.   9. HLP:   Improved lipid profile after switching to Vascepa.  HDL remains borderline at 40, but all other lipid parameters are excellent.  Congratulated him on losing some weight, but he is still in the overweight category. 10. Lymphoma in remission.  Has yearly follow-up with Dr. Benay Spice. 11.  Urothelial tumor: No recurrence.  Has yearly follow-up with Dr. Tresa Moore.  COVID-19 Education: The signs and symptoms of COVID-19 were discussed with the patient and how to seek care for testing (follow up with PCP or arrange E-visit).  The importance of social distancing was discussed today.  Time:   Today, I have spent 22 minutes with the patient with telehealth technology discussing the above problems.     Medication Adjustments/Labs and Tests Ordered: Current medicines are reviewed at length with the patient today.  Concerns regarding medicines are outlined above.   Tests Ordered: No orders of the defined types were placed in this  encounter.   Medication Changes: No orders of the defined types were placed  in this encounter.   Disposition:  Follow up 12 months  Signed, Sanda Klein, MD  01/07/2019 8:24 AM    [place]

## 2019-01-07 NOTE — Patient Instructions (Signed)

## 2019-01-09 ENCOUNTER — Other Ambulatory Visit: Payer: Self-pay | Admitting: Cardiovascular Disease

## 2019-01-16 ENCOUNTER — Telehealth: Payer: Self-pay | Admitting: Cardiovascular Disease

## 2019-01-16 ENCOUNTER — Encounter: Payer: Self-pay | Admitting: *Deleted

## 2019-01-16 MED ORDER — ISOSORBIDE MONONITRATE ER 30 MG PO TB24: 30.0000 mg | ORAL_TABLET | Freq: Every day | ORAL | 3 refills | Status: DC

## 2019-01-16 NOTE — Telephone Encounter (Signed)
Please start isosorbide mononitrate 30 mg once daily, #90, RF 3. He can expect some headache, head pulsation feeling for a few days, but should subside in about a week. OK to take acetaminophen if necessary.

## 2019-01-16 NOTE — Telephone Encounter (Signed)
Patient called wanting to speak to Dr. [name] about a matter they discussed during his virtual visit.

## 2019-01-16 NOTE — Telephone Encounter (Signed)
Spoke with patient and he stated he discussed with Dr [name] at recent visit chest tightness he had been having. Per patient Dr [name] offered him medication but he did not feel like he needed it at that time. He has continued to have chest tightness only with exertion. This only happens in the evening and not daily. Tightness relieved with rest within 5 minutes and has not taken any NTG. Will forward to Dr [name] for review

## 2019-01-16 NOTE — Telephone Encounter (Signed)
This encounter was created in error - please disregard.

## 2019-01-16 NOTE — Telephone Encounter (Signed)
Advised patient, verbalized understanding  

## 2019-02-13 ENCOUNTER — Inpatient Hospital Stay: Payer: Medicare Other | Attending: Oncology

## 2019-02-13 ENCOUNTER — Inpatient Hospital Stay (HOSPITAL_BASED_OUTPATIENT_CLINIC_OR_DEPARTMENT_OTHER): Payer: Medicare Other | Admitting: Oncology

## 2019-02-13 ENCOUNTER — Other Ambulatory Visit: Payer: Self-pay

## 2019-02-13 VITALS — BP 116/71 | HR 62 | Temp 98.9°F | Resp 17 | Ht 69.5 in | Wt 200.7 lb

## 2019-02-13 DIAGNOSIS — I739 Peripheral vascular disease, unspecified: Secondary | ICD-10-CM | POA: Insufficient documentation

## 2019-02-13 DIAGNOSIS — Z9221 Personal history of antineoplastic chemotherapy: Secondary | ICD-10-CM | POA: Insufficient documentation

## 2019-02-13 DIAGNOSIS — Z8589 Personal history of malignant neoplasm of other organs and systems: Secondary | ICD-10-CM

## 2019-02-13 DIAGNOSIS — I2510 Atherosclerotic heart disease of native coronary artery without angina pectoris: Secondary | ICD-10-CM | POA: Insufficient documentation

## 2019-02-13 DIAGNOSIS — M7061 Trochanteric bursitis, right hip: Secondary | ICD-10-CM | POA: Diagnosis not present

## 2019-02-13 DIAGNOSIS — Z8572 Personal history of non-Hodgkin lymphomas: Secondary | ICD-10-CM | POA: Insufficient documentation

## 2019-02-13 DIAGNOSIS — R51 Headache: Secondary | ICD-10-CM

## 2019-02-13 DIAGNOSIS — R21 Rash and other nonspecific skin eruption: Secondary | ICD-10-CM | POA: Diagnosis not present

## 2019-02-13 DIAGNOSIS — D696 Thrombocytopenia, unspecified: Secondary | ICD-10-CM | POA: Insufficient documentation

## 2019-02-13 DIAGNOSIS — C8208 Follicular lymphoma grade I, lymph nodes of multiple sites: Secondary | ICD-10-CM

## 2019-02-13 LAB — CBC WITH DIFFERENTIAL (CANCER CENTER ONLY)
Abs Immature Granulocytes: 0.05 10*3/uL (ref 0.00–0.07)
Basophils Absolute: 0 10*3/uL (ref 0.0–0.1)
Basophils Relative: 1 %
Eosinophils Absolute: 0.4 10*3/uL (ref 0.0–0.5)
Eosinophils Relative: 5 %
HCT: 44.2 % (ref 39.0–52.0)
Hemoglobin: 14.7 g/dL (ref 13.0–17.0)
Immature Granulocytes: 1 %
Lymphocytes Relative: 19 %
Lymphs Abs: 1.4 10*3/uL (ref 0.7–4.0)
MCH: 31.1 pg (ref 26.0–34.0)
MCHC: 33.3 g/dL (ref 30.0–36.0)
MCV: 93.6 fL (ref 80.0–100.0)
Monocytes Absolute: 0.8 10*3/uL (ref 0.1–1.0)
Monocytes Relative: 11 %
Neutro Abs: 4.6 10*3/uL (ref 1.7–7.7)
Neutrophils Relative %: 63 %
Platelet Count: 175 10*3/uL (ref 150–400)
RBC: 4.72 MIL/uL (ref 4.22–5.81)
RDW: 13.2 % (ref 11.5–15.5)
WBC Count: 7.2 10*3/uL (ref 4.0–10.5)
nRBC: 0 % (ref 0.0–0.2)

## 2019-02-13 NOTE — Progress Notes (Signed)
  [facility] OFFICE PROGRESS NOTE   Diagnosis: Non-Hodgkin's lymphoma  INTERVAL HISTORY:   David [name] returns for a scheduled visit.  He feels well.  Good appetite.  He reports intentional weight loss.  No fever or night sweats.  He has pain at the right trochanteric bursa area.  He has been followed by orthopedics and has received physical therapy and injection therapy.  He has pain in the right anterior lower leg.  He occasionally has right-sided back pain.  Objective:  Vital signs in last 24 hours:  Blood pressure 116/71, pulse 62, temperature 98.9 F (37.2 C), temperature source Oral, resp. rate 17, height 5' 9.5" (1.765 m), weight 200 lb 11.2 oz (91 kg), SpO2 98 %.   Limited physical examination secondary to distancing with the COVID pandemic HEENT: Neck without mass Lymphatics: No cervical, supraclavicular, axillary, or inguinal node GI: No mass, nontender, no hepatosplenomegaly Vascular: No leg edema Musculoskeletal: No pain with motion at the right hip, mild tenderness with palpation at the posterior right trochanter, no tenderness at the low back   Lab Results:  Lab Results  Component Value Date   WBC 7.2 02/13/2019   HGB 14.7 02/13/2019   HCT 44.2 02/13/2019   MCV 93.6 02/13/2019   PLT 175 02/13/2019   NEUTROABS 4.6 02/13/2019    Medications: I have reviewed the patient's current medications.   Assessment/Plan: 1. Non-Hodgkin's lymphoma, follicular B-cell, low grade, involving a left cervical lymph node with a staging evaluation revealing evidence for advanced stage disease. There was cervical, mediastinal, and abdominal lymphadenopathy and a question of splenic involvement on staging scans. He completed 4 cycles of fludarabine/rituximab with resolution of the palpable lymphadenopathy. The last chemotherapy was given in June 2011.  2. Skin rash following cycle #1 of fludarabine/rituximab, likely related to allopurinol or Bactrim. The Bactrim  prophylaxis was discontinued. 3. History of Chronic Thrombocytopenia secondary chemotherapy and non-Hodgkin's lymphoma-normal today 4. History of neutropenia secondary to chemotherapy. 5. Injection site reaction to a pneumococcal vaccine in October 2011. 6. History of coronary artery disease and peripheral vascular disease, followed by cardiology 7. papillary urothelial carcinoma, status post transurethral resection and intravesical mitomycin-C 04/13/2016    Disposition: David [name] remains in clinical remission from non-Hodgkin's lymphoma.  He would like to continue follow-up at the Cancer center.  He will return for an office visit and CBC in 1 year.  I suspect the right trochanteric "bursitis "is related to a benign musculoskeletal condition.  He may have radicular pain from a lumbar process.  He plans to follow-up with orthopedics.  Betsy Coder, MD  02/13/2019  11:53 AM

## 2019-02-16 ENCOUNTER — Telehealth: Payer: Self-pay | Admitting: Oncology

## 2019-02-16 NOTE — Telephone Encounter (Signed)
Scheduled per los. Mailed printout  °

## 2019-02-17 ENCOUNTER — Ambulatory Visit (INDEPENDENT_AMBULATORY_CARE_PROVIDER_SITE_OTHER): Payer: Medicare Other

## 2019-02-17 ENCOUNTER — Other Ambulatory Visit: Payer: Self-pay

## 2019-02-17 ENCOUNTER — Encounter: Payer: Self-pay | Admitting: Orthopaedic Surgery

## 2019-02-17 ENCOUNTER — Ambulatory Visit (INDEPENDENT_AMBULATORY_CARE_PROVIDER_SITE_OTHER): Payer: Medicare Other | Admitting: Orthopaedic Surgery

## 2019-02-17 DIAGNOSIS — M79604 Pain in right leg: Secondary | ICD-10-CM

## 2019-02-17 DIAGNOSIS — I255 Ischemic cardiomyopathy: Secondary | ICD-10-CM | POA: Diagnosis not present

## 2019-02-17 MED ORDER — PREDNISONE 10 MG (21) PO TBPK: ORAL_TABLET | ORAL | 0 refills | Status: DC

## 2019-02-17 NOTE — Progress Notes (Signed)
Office Visit Note   Patient: David [name]           Date of Birth: [birth year]           MRN: [id number] Visit Date: 02/17/2019              Requested by: David [name] [address] [place],  [place] [address] PCP: David [name]   Assessment & Plan: Visit Diagnoses:  1. Pain in right leg     Plan: Impression is lumbar radiculopathy.  We will try prednisone Dosepak to start with.  If no relief we may need to consider sending him to [facility] [name] for an epidural steroid injection.  Patient has a pacemaker therefore cannot have MRI.  Patient in agreement with the plan to try the prednisone first.  Follow-up as needed.  Follow-Up Instructions: Return if symptoms worsen or fail to improve.   Orders:  Orders Placed This Encounter  Procedures  . XR HIP UNILAT W OR W/O PELVIS 2-3 VIEWS RIGHT  . XR Lumbar Spine 2-3 Views   Meds ordered this encounter  Medications  . predniSONE (STERAPRED UNI-PAK 21 TAB) 10 MG (21) TBPK tablet    Sig: Take as directed    Dispense:  21 tablet    Refill:  0      Procedures: No procedures performed   Clinical Data: No additional findings.   Subjective: Chief Complaint  Patient presents with  . Right Hip - Pain  . Lower Back - Pain    David [name] who comes in for evaluation of right leg pain.  He denies any groin pain or numbness and tingling.  He does have low back pain that radiates down the posterior aspect of the buttock and then into the front of the leg all the way down to the foot.  He denies any injuries.  He has had previous stents in his right leg.  Denies any constitutional symptoms or bowel bladder dysfunction.   Review of Systems  Constitutional: Negative.   All other systems reviewed and are negative.    Objective: Vital Signs: There were no vitals taken for this visit.  Physical Exam Vitals signs and nursing note reviewed.  Constitutional:      Appearance:  He is well-developed.  HENT:     Head: Normocephalic and atraumatic.  Eyes:     Pupils: Pupils are equal, round, and reactive to light.  Neck:     Musculoskeletal: Neck supple.  Pulmonary:     Effort: Pulmonary effort is normal.  Abdominal:     Palpations: Abdomen is soft.  Musculoskeletal: Normal range of motion.  Skin:    General: Skin is warm.  Neurological:     Mental Status: He is alert and oriented to person, place, and time.  Psychiatric:        Behavior: Behavior normal.        Thought Content: Thought content normal.        Judgment: Judgment normal.     Ortho Exam Right lower extremity exam shows no weakness or groin pain.  He does have some tenderness in the posterior lateral aspect of the greater trochanter.  Hip range of motion is normal and without pain.  Normal reflexes. Specialty Comments:  No specialty comments available.  Imaging: Xr Hip Unilat W Or W/o Pelvis 2-3 Views Right  Result Date: 02/17/2019 Mild to moderate spurring of the femoral head.  No significant joint  space narrowing.  Xr Lumbar Spine 2-3 Views  Result Date: 02/17/2019 Mild spurring of vertebral bodies at multiple levels.  Preserved lordosis.  Lumbar spondylosis.    PMFS History: Patient Active Problem List   Diagnosis Date Noted  . Acute pain of right shoulder 06/11/2018  . CHB (complete heart block) ([place]) 11/29/2015  . Pacemaker battery depletion 11/28/2015  . Pacemaker lead malfunction 11/28/2015  . Pacemaker 10/21/2015  . NSVT (nonsustained ventricular tachycardia) ([place]) 06/24/2014  . Pseudoaneurysm following procedure ([place]) 10/22/2013  . Claudication in peripheral vascular disease ([place]) 10/19/2013  . Hematoma of groin -right sided; w/p PV angio 09/11/2013  . Claudication- unsuccessful PTA attempt Lt popliteal 08/31/13 08/31/2013  . Asymptomatic stenosis of right carotid artery without infarction 06/14/2013  . Unstable angina ([place]) 03/05/2013  . CAD s/p CABG 1995, s/p stent  SVG-OM 2007 and July 2014 03/05/2013  . S/P angioplasty with stent SVG-OM 7/30./14 03/05/2013  . Complete heart block- [facility] pacemaker '95 with Gen change '02, '09. New leads 2017. Pacer dependednt 03/05/2013  . PVD - Lt popliteal occlusion with collaterals '09; rt popliteal successful diamondback orbital rotational atherectomy PTA and stent using an IDEV stent 10/21/13 03/05/2013  . Non Hodgkin's lymphoma- Feb 2011- radiation Rx 03/05/2013  . Dyslipidemia 03/05/2013  . HTN (hypertension) 03/05/2013  . Cardiomyopathy, ischemic- EF 40-45% by echo 4/14 03/05/2013  . Abnormal nuclear cardiac imaging test- high risk Myoview 02/13/13 03/05/2013   Past Medical History:  Diagnosis Date  . Arthritis    "hands" (10/19/2013)  . Bladder tumor   . CAD (coronary artery disease)    cardiologist-  dr crouitoru  . Carotid artery disease (HCC)    moderate pRICA 42-59%,  5-63% LICA , >87% RECA last duplex 08-05-2015  . Cataract of both eyes   . Complete heart block ([place])   . GERD (gastroesophageal reflux disease)   . Glaucoma, both eyes   . Hematuria   . History of MI (myocardial infarction)    1989  . Hyperlipidemia   . Hypertension   . Ischemic cardiomyopathy    ef 40-45%  without clinical heart failure per cardiology note 12-28-2015 (nuclear stress ef 43% from 07-28-2015)  . Left ventricular dysfunction   . Non Hodgkin's lymphoma ([place]) dx 06/2010---  oncologist-  dr Benay Spice---  in clinical remission since 2013   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 -- lymphadenopathy to neck, mediastinal , and abdominal--  chemotherapy completed 06/ 2011  . NSVT (nonsustained ventricular tachycardia) [facility]) cardiologist-  dr crouitoru   Max rate 200 bpm, 28-beat, nearly 9-second episode recorded by pacemaker on November 15, asymptomatic   . Pacemaker pacemaker dependant due to CHB   1st Pacemaker Placement 1998 w/ New generator  12-31-2000 ; 08-04-2008;  11-29-2015  . PAD (peripheral artery  disease) ([place])    followed by dr berry--- last duplex 08-05-2015  patents bilateral popliteal stents, bilateral SFA 30-49% stenosis  . Peripheral vascular disease with claudication ([place])    dx 2009 left popliteal occlusion with collaterals; 03- 2015  s/p  stenting right popliteal  by dr berry and left popliteal stenting by dr Andree Elk ([facility]) july 2015  . S/P CABG x 5    09-10-1988  . S/P drug eluting coronary stent placement 01/04/2006   01-04-2006--- DES to SVG to OM1, OM2, and IR, and SVG to PDA(x2 grafts)  and 03-04-2013  DES to SVR to OM  . Wears glasses     Family History  Problem Relation  Age of Onset  . Diabetes Mother   . Stroke Mother   . Heart attack Father   . Hypertension Sister   . Hyperlipidemia Sister   . Hypertension Sister   . Hyperlipidemia Sister     Past Surgical History:  Procedure Laterality Date  . ANAL FISSURE REPAIR  12-09-2000 and 1970's  . CARDIAC CATHETERIZATION  04/09/2000   patent grafts  . CARDIAC PACEMAKER PLACEMENT  11-15-1993   dr Cyndia Bent  . CARDIOVASCULAR STRESS TEST  07-28-2015  dr crouitoru   Intermediate risk nuclear study w/ large, severe, predominantly fixed inferior and apical defect; minimal reversibility inferior lateral wall; findings consistent w/ large prior infarct and trivial peri-infarct ischemia;  ef 43% w/ inferior hypokinesis and apical akinesis; mild LVE  . CORONARY ANGIOPLASTY WITH STENT PLACEMENT  01-04-2006  dr gamble   DES to pSVG to OM1, SVG to OM2 &  IR, and mSVG to RCA dPDA (x2 grafts );  pLAD , pLCFX and  mRCA occluded;  LIMA to LAD mid-distal patent with occlusion beyond LIMA with collateral flow;  ef 30%  . CORONARY ARTERY BYPASS GRAFT  09/10/1988   dr Redmond Pulling   "CABG X5" (03/04/2013) LIMA to LAD,SVG to imtermediate OM1 OM II,SVG to PDA  . CYSTOSCOPY W/ RETROGRADES Bilateral 04/13/2016   Procedure: CYSTOSCOPY WITH RETROGRADE PYELOGRAM;  Surgeon: Alexis Frock, [name];  Location: [facility];  Service: Urology;   Laterality: Bilateral;  . EP IMPLANTABLE DEVICE N/A 11/29/2015   Procedure: Pacemaker Implant;  Surgeon: Sanda Klein, [name];  Location: [place] [facility];  Service: Cardiovascular;  Laterality: N/A;  new generator and new atrial, vertricular leads;  Patent attorney  . LEFT HEART CATHETERIZATION WITH CORONARY/GRAFT ANGIOGRAM N/A 03/04/2013   Procedure: LEFT HEART CATHETERIZATION WITH Beatrix Fetters;  Surgeon: Troy Sine, [name];  Location[facility] C[facility] LAB;  Service: Cardiovascular;  Laterality: N/A;  . LOWER EXTREMITY ANGIOGRAM N/A 08/31/2013   Procedure: LOWER EXTREMITY ANGIOGRAM;  Surgeon: Lorretta Harp, [name];  Location: [facility] CATH LAB;  Service: Cardiovascular;  Laterality: N/A;  . PACEMAKER REPLACEMENT  12/31/2000;  08-04-2008  . PERCUTANEOUS CORONARY STENT INTERVENTION (PCI-S)  03/04/2013   Procedure: PERCUTANEOUS CORONARY STENT INTERVENTION (PCI-S);  Surgeon: Troy Sine, [name];  Location: [facility] CATH LAB;  Service: Cardiovascular;;  DES to S-OM for in-stent restenosis  . POPLITEAL ARTERY STENT Right 10-21-2013  dr berry   atherectomy, PTA , and IDEV stent x1  . POPLITEAL ARTERY STENT Left 07/ 2015  dr [facility]ree [facility]k at [facility]   angioplasty / stent x2  . 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 SUPERFICIAL LYMPH NODE BIOPSY / EXCISION Left 2011   cervical lymph node  . TRANSURETHRAL RESECTION OF BLADDER TUMOR N/A 04/13/2016   Procedure: TRANSURETHRAL RESECTION OF BLADDER TUMOR (TURBT) with clot evacuation;  Surgeon: Alexis Frock, [name];  [facility];  Service: Urology;  Laterality: N/A;  . VENOGRAM Left 11/29/2015   Procedure: Venogram;  Surgeon: Sanda Klein, [name];  Location: [place] [facility];  Service: Cardiovascular;  Laterality: Left;   Social History   Occupational History  . Not on file  Tobacco Use  . Smoking status: Former Smoker    Packs/day: 0.12    Years: 1.00    Pack years: 0.12    Types: Cigarettes    Quit date:  08/06/1956    Years since quitting: 62.5  . Smokeless tobacco: Never Used  Substance and Sexual Activity  . Alcohol use:  No  . Drug use: No  . Sexual activity: Never

## 2019-02-24 DIAGNOSIS — L723 Sebaceous cyst: Secondary | ICD-10-CM | POA: Diagnosis not present

## 2019-03-12 DIAGNOSIS — Z961 Presence of intraocular lens: Secondary | ICD-10-CM | POA: Diagnosis not present

## 2019-03-12 DIAGNOSIS — H10413 Chronic giant papillary conjunctivitis, bilateral: Secondary | ICD-10-CM | POA: Diagnosis not present

## 2019-03-12 DIAGNOSIS — H524 Presbyopia: Secondary | ICD-10-CM | POA: Diagnosis not present

## 2019-03-12 DIAGNOSIS — H52203 Unspecified astigmatism, bilateral: Secondary | ICD-10-CM | POA: Diagnosis not present

## 2019-03-23 ENCOUNTER — Other Ambulatory Visit: Payer: Self-pay | Admitting: Cardiovascular Disease

## 2019-03-24 ENCOUNTER — Ambulatory Visit (INDEPENDENT_AMBULATORY_CARE_PROVIDER_SITE_OTHER): Payer: Medicare Other | Admitting: *Deleted

## 2019-03-24 DIAGNOSIS — I442 Atrioventricular block, complete: Secondary | ICD-10-CM | POA: Diagnosis not present

## 2019-03-25 ENCOUNTER — Telehealth: Payer: Self-pay

## 2019-03-25 LAB — CUP PACEART REMOTE DEVICE CHECK
Battery Remaining Longevity: 120 mo
Battery Remaining Percentage: 100 %
Brady Statistic RA Percent Paced: 66 %
Brady Statistic RV Percent Paced: 97 %
Date Time Interrogation Session: 20200819155745
Implantable Lead Implant Date: 20170425
Implantable Lead Implant Date: 20170425
Implantable Lead Location: 753859
Implantable Lead Location: 753860
Implantable Lead Model: 7741
Implantable Lead Model: 7742
Implantable Lead Serial Number: 720495
Implantable Lead Serial Number: 763888
Implantable Pulse Generator Implant Date: 20170425
Lead Channel Impedance Value: 678 Ohm
Lead Channel Impedance Value: 806 Ohm
Lead Channel Pacing Threshold Amplitude: 0.7 V
Lead Channel Pacing Threshold Pulse Width: 0.4 ms
Lead Channel Setting Pacing Amplitude: 1.2 V
Lead Channel Setting Pacing Amplitude: 2.5 V
Lead Channel Setting Pacing Pulse Width: 0.4 ms
Lead Channel Setting Sensing Sensitivity: 2.5 mV
Pulse Gen Serial Number: 718025

## 2019-03-25 NOTE — Telephone Encounter (Signed)
Left message for patient to remind of missed remote transmission.  

## 2019-04-01 NOTE — Progress Notes (Signed)
Remote pacemaker transmission.   

## 2019-04-07 DIAGNOSIS — Z23 Encounter for immunization: Secondary | ICD-10-CM | POA: Diagnosis not present

## 2019-06-02 DIAGNOSIS — C672 Malignant neoplasm of lateral wall of bladder: Secondary | ICD-10-CM | POA: Diagnosis not present

## 2019-06-17 DIAGNOSIS — E782 Mixed hyperlipidemia: Secondary | ICD-10-CM | POA: Diagnosis not present

## 2019-06-17 DIAGNOSIS — I119 Hypertensive heart disease without heart failure: Secondary | ICD-10-CM | POA: Diagnosis not present

## 2019-06-22 DIAGNOSIS — Z8551 Personal history of malignant neoplasm of bladder: Secondary | ICD-10-CM | POA: Diagnosis not present

## 2019-06-22 DIAGNOSIS — E782 Mixed hyperlipidemia: Secondary | ICD-10-CM | POA: Diagnosis not present

## 2019-06-22 DIAGNOSIS — I739 Peripheral vascular disease, unspecified: Secondary | ICD-10-CM | POA: Diagnosis not present

## 2019-06-22 DIAGNOSIS — Z7189 Other specified counseling: Secondary | ICD-10-CM | POA: Diagnosis not present

## 2019-06-22 DIAGNOSIS — K219 Gastro-esophageal reflux disease without esophagitis: Secondary | ICD-10-CM | POA: Diagnosis not present

## 2019-06-22 DIAGNOSIS — I2510 Atherosclerotic heart disease of native coronary artery without angina pectoris: Secondary | ICD-10-CM | POA: Diagnosis not present

## 2019-06-22 DIAGNOSIS — J301 Allergic rhinitis due to pollen: Secondary | ICD-10-CM | POA: Diagnosis not present

## 2019-06-22 DIAGNOSIS — I119 Hypertensive heart disease without heart failure: Secondary | ICD-10-CM | POA: Diagnosis not present

## 2019-06-22 DIAGNOSIS — C8590 Non-Hodgkin lymphoma, unspecified, unspecified site: Secondary | ICD-10-CM | POA: Diagnosis not present

## 2019-06-22 DIAGNOSIS — Z0000 Encounter for general adult medical examination without abnormal findings: Secondary | ICD-10-CM | POA: Diagnosis not present

## 2019-06-23 ENCOUNTER — Ambulatory Visit (INDEPENDENT_AMBULATORY_CARE_PROVIDER_SITE_OTHER): Payer: Medicare Other | Admitting: *Deleted

## 2019-06-23 ENCOUNTER — Other Ambulatory Visit: Payer: Self-pay | Admitting: Cardiovascular Disease

## 2019-06-23 DIAGNOSIS — I442 Atrioventricular block, complete: Secondary | ICD-10-CM

## 2019-06-23 DIAGNOSIS — I4729 Other ventricular tachycardia: Secondary | ICD-10-CM

## 2019-06-24 LAB — CUP PACEART REMOTE DEVICE CHECK
Battery Remaining Longevity: 114 mo
Battery Remaining Percentage: 100 %
Brady Statistic RA Percent Paced: 65 %
Brady Statistic RV Percent Paced: 96 %
Date Time Interrogation Session: 20201117072000
Implantable Lead Implant Date: 20170425
Implantable Lead Implant Date: 20170425
Implantable Lead Location: 753859
Implantable Lead Location: 753860
Implantable Lead Model: 7741
Implantable Lead Model: 7742
Implantable Lead Serial Number: 720495
Implantable Lead Serial Number: 763888
Implantable Pulse Generator Implant Date: 20170425
Lead Channel Impedance Value: 721 Ohm
Lead Channel Impedance Value: 792 Ohm
Lead Channel Pacing Threshold Amplitude: 0.8 V
Lead Channel Pacing Threshold Pulse Width: 0.4 ms
Lead Channel Setting Pacing Amplitude: 1.2 V
Lead Channel Setting Pacing Amplitude: 2.5 V
Lead Channel Setting Pacing Pulse Width: 0.4 ms
Lead Channel Setting Sensing Sensitivity: 2.5 mV
Pulse Gen Serial Number: 718025

## 2019-07-21 NOTE — Progress Notes (Signed)
Remote pacemaker transmission.   

## 2019-08-28 ENCOUNTER — Ambulatory Visit: Payer: Medicare Other | Attending: Internal Medicine

## 2019-08-28 DIAGNOSIS — Z23 Encounter for immunization: Secondary | ICD-10-CM | POA: Insufficient documentation

## 2019-08-28 NOTE — Progress Notes (Signed)
   Covid-19 Vaccination Clinic  Name:  David [name]    MRN: [id number] DOB: 11-09-1932  08/28/2019  [name] was observed post Covid-19 immunization for 15 minutes without incidence. He was provided with Vaccine Information Sheet and instruction to access the V-Safe system.   [name] was instructed to call 911 with any severe reactions post vaccine: Marland Kitchen Difficulty breathing  . Swelling of your face and throat  . A fast heartbeat  . A bad rash all over your body  . Dizziness and weakness    Immunizations Administered    Name Date Dose VIS Date Route   Pfizer COVID-19 Vaccine 08/28/2019 10:13 AM 0.3 mL 07/17/2019 Intramuscular   Manufacturer: [place]   Lot: [id number]   [place]: [id number]

## 2019-09-08 ENCOUNTER — Other Ambulatory Visit: Payer: Self-pay | Admitting: Cardiovascular Disease

## 2019-09-08 ENCOUNTER — Ambulatory Visit (HOSPITAL_COMMUNITY)
Admission: RE | Admit: 2019-09-08 | Discharge: 2019-09-08 | Disposition: A | Discharge status: Home / Self Care | Payer: Medicare Other | Source: Ambulatory Visit | Attending: Internal Medicine | Admitting: Internal Medicine

## 2019-09-08 ENCOUNTER — Other Ambulatory Visit: Payer: Self-pay

## 2019-09-08 ENCOUNTER — Encounter (HOSPITAL_COMMUNITY): Payer: Medicare Other

## 2019-09-08 ENCOUNTER — Ambulatory Visit (HOSPITAL_BASED_OUTPATIENT_CLINIC_OR_DEPARTMENT_OTHER)
Admission: RE | Admit: 2019-09-08 | Discharge: 2019-09-08 | Disposition: A | Discharge status: Home / Self Care | Payer: Medicare Other | Source: Ambulatory Visit | Attending: Cardiovascular Disease | Admitting: Cardiovascular Disease

## 2019-09-08 DIAGNOSIS — I739 Peripheral vascular disease, unspecified: Secondary | ICD-10-CM

## 2019-09-08 DIAGNOSIS — I6529 Occlusion and stenosis of unspecified carotid artery: Secondary | ICD-10-CM | POA: Diagnosis not present

## 2019-09-09 ENCOUNTER — Other Ambulatory Visit: Payer: Self-pay

## 2019-09-09 DIAGNOSIS — I6529 Occlusion and stenosis of unspecified carotid artery: Secondary | ICD-10-CM

## 2019-09-09 DIAGNOSIS — I739 Peripheral vascular disease, unspecified: Secondary | ICD-10-CM

## 2019-09-18 ENCOUNTER — Ambulatory Visit: Payer: Medicare Other | Attending: Internal Medicine

## 2019-09-18 DIAGNOSIS — Z23 Encounter for immunization: Secondary | ICD-10-CM | POA: Insufficient documentation

## 2019-09-18 NOTE — Progress Notes (Signed)
   Covid-19 Vaccination Clinic  Name:  David [name]    MRN: [id number] DOB: 1933/04/06  09/18/2019  David [name] was observed post Covid-19 immunization for 15 minutes without incidence. He was provided with Vaccine Information Sheet and instruction to access the V-Safe system.   David [name] was instructed to call 911 with any severe reactions post vaccine: Marland Kitchen Difficulty breathing  . Swelling of your face and throat  . A fast heartbeat  . A bad rash all over your body  . Dizziness and weakness    Immunizations Administered    Name Date Dose VIS Date Route   Pfizer COVID-19 Vaccine 09/18/2019 12:24 PM 0.3 mL 07/17/2019 Intramuscular   Manufacturer: [place]   Lot: [id number]   [place]: [id number]

## 2019-09-22 ENCOUNTER — Ambulatory Visit (INDEPENDENT_AMBULATORY_CARE_PROVIDER_SITE_OTHER): Payer: Medicare Other | Admitting: *Deleted

## 2019-09-22 ENCOUNTER — Ambulatory Visit (INDEPENDENT_AMBULATORY_CARE_PROVIDER_SITE_OTHER): Payer: Medicare Other | Admitting: Cardiovascular Disease

## 2019-09-22 ENCOUNTER — Other Ambulatory Visit: Payer: Self-pay

## 2019-09-22 ENCOUNTER — Encounter: Payer: Self-pay | Admitting: Cardiovascular Disease

## 2019-09-22 VITALS — BP 116/78 | HR 64 | Ht 69.5 in | Wt 202.0 lb

## 2019-09-22 DIAGNOSIS — I6529 Occlusion and stenosis of unspecified carotid artery: Secondary | ICD-10-CM

## 2019-09-22 DIAGNOSIS — I25718 Atherosclerosis of autologous vein coronary artery bypass graft(s) with other forms of angina pectoris: Secondary | ICD-10-CM

## 2019-09-22 DIAGNOSIS — I6521 Occlusion and stenosis of right carotid artery: Secondary | ICD-10-CM

## 2019-09-22 DIAGNOSIS — I442 Atrioventricular block, complete: Secondary | ICD-10-CM

## 2019-09-22 DIAGNOSIS — I739 Peripheral vascular disease, unspecified: Secondary | ICD-10-CM | POA: Diagnosis not present

## 2019-09-22 DIAGNOSIS — E785 Hyperlipidemia, unspecified: Secondary | ICD-10-CM | POA: Diagnosis not present

## 2019-09-22 DIAGNOSIS — I255 Ischemic cardiomyopathy: Secondary | ICD-10-CM | POA: Diagnosis not present

## 2019-09-22 DIAGNOSIS — I10 Essential (primary) hypertension: Secondary | ICD-10-CM | POA: Diagnosis not present

## 2019-09-22 LAB — CUP PACEART REMOTE DEVICE CHECK
Battery Remaining Longevity: 114 mo
Battery Remaining Percentage: 100 %
Brady Statistic RA Percent Paced: 65 %
Brady Statistic RV Percent Paced: 96 %
Date Time Interrogation Session: 20210216022100
Implantable Lead Implant Date: 20170425
Implantable Lead Implant Date: 20170425
Implantable Lead Location: 753859
Implantable Lead Location: 753860
Implantable Lead Model: 7741
Implantable Lead Model: 7742
Implantable Lead Serial Number: 720495
Implantable Lead Serial Number: 763888
Implantable Pulse Generator Implant Date: 20170425
Lead Channel Impedance Value: 693 Ohm
Lead Channel Impedance Value: 812 Ohm
Lead Channel Pacing Threshold Amplitude: 0.8 V
Lead Channel Pacing Threshold Pulse Width: 0.4 ms
Lead Channel Setting Pacing Amplitude: 1.2 V
Lead Channel Setting Pacing Amplitude: 2.5 V
Lead Channel Setting Pacing Pulse Width: 0.4 ms
Lead Channel Setting Sensing Sensitivity: 2.5 mV
Pulse Gen Serial Number: 718025

## 2019-09-22 NOTE — Assessment & Plan Note (Signed)
History of ischemic cardiomyopathy with echo performed 11/04/2012 revealing EF of 40 to 45%.  He is on appropriate medications.  Is asymptomatic from this.

## 2019-09-22 NOTE — Assessment & Plan Note (Signed)
History of complete heart block status post pacemaker insertion remotely followed by Dr. [name] recently seen virtually in June of last year.  He noted 6 months battery life remaining.

## 2019-09-22 NOTE — Assessment & Plan Note (Signed)
History of carotid artery disease with an asymptomatic moderate right ICA stenosis by duplex ultrasound 09/08/2019.  We will continue to follow this on annual basis.  He has no neurologic symptoms.

## 2019-09-22 NOTE — Patient Instructions (Signed)
Medication Instructions:  Your physician recommends that you continue on your current medications as directed. Please refer to the Current Medication list given to you today.  If you need a refill on your cardiac medications before your next appointment, please call your pharmacy.   Lab work: NONE  Testing/Procedures: In ONE year: Your physician has requested that you have an ankle brachial index (ABI). During this test an ultrasound and blood pressure cuff are used to evaluate the arteries that supply the arms and legs with blood. Allow thirty minutes for this exam. There are no restrictions or special instructions.  AND  Your physician has requested that you have a lower extremity arterial exercise duplex. During this test, exercise and ultrasound are used to evaluate arterial blood flow in the legs. Allow one hour for this exam. There are no restrictions or special instructions.  AND  Your physician has requested that you have a carotid duplex. This test is an ultrasound of the carotid arteries in your neck. It looks at blood flow through these arteries that supply the brain with blood. Allow one hour for this exam. There are no restrictions or special instructions.  Follow-Up: At [facility], you and your health needs are our priority.  As part of our continuing mission to provide you with exceptional heart care, we have created designated Provider Care Teams.  These Care Teams include your primary Cardiologist (physician) and Advanced Practice Providers (APPs -  Physician Assistants and Nurse Practitioners) who all work together to provide you with the care you need, when you need it. You may see Dr. Gwenlyn Found or one of the following Advanced Practice Providers on your designated Care Team:    Kerin Ransom, PA-C  [place], [place]  Coletta Memos, [place]  Your physician wants you to follow-up in: 1 year with Dr. Gwenlyn Found. You will receive a reminder letter in the mail two months in  advance. If you don't receive a letter, please call our office to schedule the follow-up appointment.

## 2019-09-22 NOTE — Assessment & Plan Note (Signed)
History of CAD status post CABG 1995 with a LIMA to his LAD, vein graft to the intermediate, obtuse marginal branch 1 and 2 and to the PDA.  He had a SVG to PDA Cypher drug-eluting stent placed in 2007 and a circumflex SVG stent placed by Dr. Claiborne Billings 03/04/2013.  He gets mild effort angina improved with long-acting nitrates which has not changed in frequency or severity.

## 2019-09-22 NOTE — Assessment & Plan Note (Signed)
History of dyslipidemia on Crestor and Vascepa with lipid profile performed 06/17/2019 revealing a total cholesterol of 126, LDL 56 and HDL 38.

## 2019-09-22 NOTE — Progress Notes (Signed)
09/22/2019 David [name]   06/04/1933  [id number]  Primary Physician Mayra Neer, MD Primary Cardiologist: Lorretta Harp MD [name], [place]  HPI:  David [name] is a 86 y.o.  married Caucasian male father of 3 children is retired Tree surgeon for a company. He was initially patient of Dr. Rush Landmark Gamble's followed by Dr. Terance Ice and now Dr. [name]. I last saw him in the office 07/15/2018. He has a history of peripheral arterial disease with known moderate right internal carotid artery stenosis but with ultrasound as well as an occluded left popliteal artery by angiography back in 2009. He has geniculate collaterals with three-vessel runoff and lifestyle limiting claudication. His other problems include a history of hypertension and hyperlipidemia. He had coronary bypass grafting 09/10/88 and has had 3 stent procedures since most recently by Dr. Ellouise Newer on 03/04/13 which time he placed a drug-eluting stent in the obtuse marginal vein graft. He denies chest pain or shortness of breath. He is currently displaying chronic rehabilitation. Because of ongoing claudication left greater than right as well as advances in technology it was decided to attempt percutaneous revascularization of the left popliteal chronic total occlusion.I attempted this on 08/31/2013 unsuccessfully. I then performed staged right SFA diamondback orbital rotational atherectomy, PT and stenting using an IDEV stent with excellent angiographic result. Followup Dopplers performed on 11/13/13 revealed an ABI 1.1 on the right. The procedure was complicated by a left common femoral pseudoaneurysm which was compressed and ultimately closed confirmed by ultrasound. I ultimately referred him to Dr. Brunetta Jeans at [facility] he was able to cross his left popliteal artery CTO successfully. Since that time he has had no claudication. He does have moderate right internal carotid artery stenosis was neurologically  asymptomatic. Dr. [name] follows him for his pacemaker as well.  Since I saw him 1 year ago he has remained stable.  He denies chest pain, shortness of breath or claudication.  Recent carotid Doppler showed moderately severe right ICA stenosis and lower extremity Dopplers revealed a widely patent right SFA stent with an occluded left popliteal stent.   Current Meds  Medication Sig  . aspirin EC 81 MG tablet Take 81 mg by mouth daily.  . cetirizine (ZYRTEC) 10 MG tablet Take 10 mg by mouth every evening.   . cilostazol (PLETAL) 100 MG tablet TAKE 1 TABLET BY MOUTH TWO  TIMES DAILY  . Glucosamine-Chondroitin 750-600 MG TABS Take 1 tablet by mouth 2 (two) times daily.  Marland Kitchen lisinopril (PRINIVIL,ZESTRIL) 20 MG tablet Take 20 mg by mouth every morning.   . metoprolol tartrate (LOPRESSOR) 50 MG tablet TAKE 1 AND 1/2 TABLETS BY  MOUTH TWO TIMES DAILY  . Multiple Vitamins-Minerals (SENIOR MULTIVITAMIN PLUS PO) Take 1 tablet by mouth daily.  . nitroGLYCERIN (NITROSTAT) 0.4 MG SL tablet Place 1 tablet (0.4 mg total) under the tongue every 5 (five) minutes as needed for chest pain.  . pantoprazole (PROTONIX) 40 MG tablet TAKE 1 TABLET BY MOUTH  DAILY  . predniSONE (STERAPRED UNI-PAK 21 TAB) 10 MG (21) TBPK tablet Take as directed  . rosuvastatin (CRESTOR) 40 MG tablet TAKE 1 TABLET BY MOUTH AT  BEDTIME  . traMADol (ULTRAM) 50 MG tablet Take 50 mg by mouth every 8 (eight) hours.  Marland Kitchen VASCEPA 1 g CAPS TAKE 2 CAPSULES BY MOUTH 2  TIMES DAILY WITH A MEAL.     Allergies  Allergen Reactions  . Azithromycin Diarrhea  . Lactose Intolerance (  Gi) Diarrhea  . Niacin And Related Diarrhea  . Sulfa Antibiotics Rash    Social History   Socioeconomic History  . Marital status: Married    Spouse name: Not on file  . Number of children: Not on file  . Years of education: Not on file  . Highest education level: Not on file  Occupational History  . Not on file  Tobacco Use  . Smoking status: Former Smoker     Packs/day: 0.12    Years: 1.00    Pack years: 0.12    Types: Cigarettes    Quit date: 08/06/1956    Years since quitting: 63.1  . Smokeless tobacco: Never Used  Substance and Sexual Activity  . Alcohol use: No  . Drug use: No  . Sexual activity: Never  Other Topics Concern  . Not on file  Social History Narrative  . Not on file   Social Determinants of Health   Financial Resource Strain:   . Difficulty of Paying Living Expenses: Not on file  Food Insecurity:   . Worried About Charity fundraiser in the Last Year: Not on file  . Ran Out of Food in the Last Year: Not on file  Transportation Needs:   . Lack of Transportation (Medical): Not on file  . Lack of Transportation (Non-Medical): Not on file  Physical Activity:   . Days of Exercise per Week: Not on file  . Minutes of Exercise per Session: Not on file  Stress:   . Feeling of Stress : Not on file  Social Connections:   . Frequency of Communication with Friends and Family: Not on file  . Frequency of Social Gatherings with Friends and Family: Not on file  . Attends Religious Services: Not on file  . Active Member of Clubs or Organizations: Not on file  . Attends Archivist Meetings: Not on file  . Marital Status: Not on file  Intimate Partner Violence:   . Fear of Current or Ex-Partner: Not on file  . Emotionally Abused: Not on file  . Physically Abused: Not on file  . Sexually Abused: Not on file     Review of Systems: General: negative for chills, fever, night sweats or weight changes.  Cardiovascular: negative for chest pain, dyspnea on exertion, edema, orthopnea, palpitations, paroxysmal nocturnal dyspnea or shortness of breath Dermatological: negative for rash Respiratory: negative for cough or wheezing Urologic: negative for hematuria Abdominal: negative for nausea, vomiting, diarrhea, bright red blood per rectum, melena, or hematemesis Neurologic: negative for visual changes, syncope, or  dizziness All other systems reviewed and are otherwise negative except as noted above.    Blood pressure 116/78, pulse 64, height 5' 9.5" (1.765 m), weight 202 lb (91.6 kg).  General appearance: alert and no distress Neck: no adenopathy, no carotid bruit, no JVD, supple, symmetrical, trachea midline and thyroid not enlarged, symmetric, no tenderness/mass/nodules Lungs: clear to auscultation bilaterally Heart: regular rate and rhythm, S1, S2 normal, no murmur, click, rub or gallop Extremities: extremities normal, atraumatic, no cyanosis or edema Pulses: Diminished left pedal pulse Skin: Skin color, texture, turgor normal. No rashes or lesions Neurologic: Alert and oriented X 3, normal strength and tone. Normal symmetric reflexes. Normal coordination and gait  EKG AV sequentially paced rhythm at 64.  I personally reviewed this EKG.  ASSESSMENT AND PLAN:   Complete heart block- [facility] pacemaker '95 with Gen change '02, '09. New leads 2017. Pacer dependednt History of complete heart block status post pacemaker  insertion remotely followed by Dr. [name] recently seen virtually in June of last year.  He noted 6 months battery life remaining.  PVD - Lt popliteal occlusion with collaterals '09; rt popliteal successful diamondback orbital rotational atherectomy PTA and stent using an IDEV stent 10/21/13 History of peripheral arterial disease status post right SFA diamondback rotational atherectomy, PTA and stenting using IDE V stent with excellent result.  Follow-up Dopplers recently performed showed a widely patent right SFA stent.  He did have intervention by Dr. Brunetta Jeans at [facility] for a left popliteal CTO which I was unable to cross personally but Dr. Andree Elk was successful in however his most recent Doppler showed the stent to be occluded.  He really denies claudication.  CAD s/p CABG 1995, s/p stent SVG-OM 2007 and July 2014 History of CAD status post CABG 1995 with a LIMA to his LAD, vein  graft to the intermediate, obtuse marginal branch 1 and 2 and to the PDA.  He had a SVG to PDA Cypher drug-eluting stent placed in 2007 and a circumflex SVG stent placed by Dr. Claiborne Billings 03/04/2013.  He gets mild effort angina improved with long-acting nitrates which has not changed in frequency or severity.  Dyslipidemia History of dyslipidemia on Crestor and Vascepa with lipid profile performed 06/17/2019 revealing a total cholesterol of 126, LDL 56 and HDL 38.  HTN (hypertension) History of essential hypertension with blood pressure measured today at 116/78.  He is on lisinopril and metoprolol.  Cardiomyopathy, ischemic- EF 40-45% by echo 4/14 History of ischemic cardiomyopathy with echo performed 11/04/2012 revealing EF of 40 to 45%.  He is on appropriate medications.  Is asymptomatic from this.  Asymptomatic stenosis of right carotid artery without infarction History of carotid artery disease with an asymptomatic moderate right ICA stenosis by duplex ultrasound 09/08/2019.  We will continue to follow this on annual basis.  He has no neurologic symptoms.      Lorretta Harp MD FACP,FACC,FAHA, [facility] 09/22/2019 9:39 AM

## 2019-09-22 NOTE — Assessment & Plan Note (Signed)
History of essential hypertension with blood pressure measured today at 116/78.  He is on lisinopril and metoprolol.

## 2019-09-22 NOTE — Assessment & Plan Note (Signed)
History of peripheral arterial disease status post right SFA diamondback rotational atherectomy, PTA and stenting using IDE V stent with excellent result.  Follow-up Dopplers recently performed showed a widely patent right SFA stent.  He did have intervention by Dr. Brunetta Jeans at [facility] for a left popliteal CTO which I was unable to cross personally but Dr. Andree Elk was successful in however his most recent Doppler showed the stent to be occluded.  He really denies claudication.

## 2019-09-23 NOTE — Progress Notes (Signed)
PPM Remote  

## 2019-10-11 ENCOUNTER — Other Ambulatory Visit: Payer: Self-pay | Admitting: Cardiovascular Disease

## 2019-12-22 ENCOUNTER — Ambulatory Visit (INDEPENDENT_AMBULATORY_CARE_PROVIDER_SITE_OTHER): Payer: Medicare Other | Admitting: *Deleted

## 2019-12-22 DIAGNOSIS — I442 Atrioventricular block, complete: Secondary | ICD-10-CM | POA: Diagnosis not present

## 2019-12-22 DIAGNOSIS — I4729 Other ventricular tachycardia: Secondary | ICD-10-CM

## 2019-12-22 LAB — CUP PACEART REMOTE DEVICE CHECK
Battery Remaining Longevity: 108 mo
Battery Remaining Percentage: 100 %
Brady Statistic RA Percent Paced: 66 %
Brady Statistic RV Percent Paced: 96 %
Date Time Interrogation Session: 20210518022100
Implantable Lead Implant Date: 20170425
Implantable Lead Implant Date: 20170425
Implantable Lead Location: 753859
Implantable Lead Location: 753860
Implantable Lead Model: 7741
Implantable Lead Model: 7742
Implantable Lead Serial Number: 720495
Implantable Lead Serial Number: 763888
Implantable Pulse Generator Implant Date: 20170425
Lead Channel Impedance Value: 680 Ohm
Lead Channel Impedance Value: 825 Ohm
Lead Channel Pacing Threshold Amplitude: 0.7 V
Lead Channel Pacing Threshold Pulse Width: 0.4 ms
Lead Channel Setting Pacing Amplitude: 1.2 V
Lead Channel Setting Pacing Amplitude: 2.5 V
Lead Channel Setting Pacing Pulse Width: 0.4 ms
Lead Channel Setting Sensing Sensitivity: 2.5 mV
Pulse Gen Serial Number: 718025

## 2019-12-24 NOTE — Progress Notes (Signed)
Remote pacemaker transmission.   

## 2019-12-27 ENCOUNTER — Other Ambulatory Visit: Payer: Self-pay | Admitting: Cardiovascular Disease

## 2019-12-28 ENCOUNTER — Other Ambulatory Visit: Payer: Self-pay | Admitting: Cardiovascular Disease

## 2019-12-28 MED ORDER — ISOSORBIDE MONONITRATE ER 30 MG PO TB24: 30.0000 mg | ORAL_TABLET | Freq: Every day | ORAL | 2 refills | Status: DC

## 2019-12-28 NOTE — Telephone Encounter (Signed)
Refill for Imdur 30 mg po daily appropriate for refill.  Pt has been compliant with all follow-up appts.  Request for 90 day supply of this medication was sent to his confirmed pharmacy of choice.

## 2019-12-28 NOTE — Telephone Encounter (Signed)
Rx request sent to pharmacy.  

## 2019-12-28 NOTE — Telephone Encounter (Signed)
*  STAT* If patient is at the pharmacy, call can be transferred to refill team.   1. Which medications need to be refilled? (please list name of each medication and dose if known)   isosorbide mononitrate (IMDUR) 30 MG 24 hr tablet    2. Which pharmacy/location (including street and city if local pharmacy) is medication to be sent to? [id number] - [place], [place] - [address] AT [address]  3. Do they need a 30 day or 90 day supply? 90 day supply

## 2020-01-11 ENCOUNTER — Encounter: Payer: Self-pay | Admitting: Cardiovascular Disease

## 2020-01-11 ENCOUNTER — Ambulatory Visit (INDEPENDENT_AMBULATORY_CARE_PROVIDER_SITE_OTHER): Payer: Medicare Other | Admitting: Cardiovascular Disease

## 2020-01-11 ENCOUNTER — Other Ambulatory Visit: Payer: Self-pay

## 2020-01-11 VITALS — BP 112/60 | HR 65 | Temp 97.2°F | Ht 69.5 in | Wt 204.4 lb

## 2020-01-11 DIAGNOSIS — E785 Hyperlipidemia, unspecified: Secondary | ICD-10-CM | POA: Diagnosis not present

## 2020-01-11 DIAGNOSIS — I6521 Occlusion and stenosis of right carotid artery: Secondary | ICD-10-CM

## 2020-01-11 DIAGNOSIS — I472 Ventricular tachycardia: Secondary | ICD-10-CM | POA: Diagnosis not present

## 2020-01-11 DIAGNOSIS — I4729 Other ventricular tachycardia: Secondary | ICD-10-CM

## 2020-01-11 DIAGNOSIS — I739 Peripheral vascular disease, unspecified: Secondary | ICD-10-CM | POA: Diagnosis not present

## 2020-01-11 DIAGNOSIS — C8208 Follicular lymphoma grade I, lymph nodes of multiple sites: Secondary | ICD-10-CM

## 2020-01-11 DIAGNOSIS — I10 Essential (primary) hypertension: Secondary | ICD-10-CM

## 2020-01-11 DIAGNOSIS — I255 Ischemic cardiomyopathy: Secondary | ICD-10-CM

## 2020-01-11 DIAGNOSIS — I442 Atrioventricular block, complete: Secondary | ICD-10-CM | POA: Diagnosis not present

## 2020-01-11 DIAGNOSIS — I25718 Atherosclerosis of autologous vein coronary artery bypass graft(s) with other forms of angina pectoris: Secondary | ICD-10-CM | POA: Diagnosis not present

## 2020-01-11 MED ORDER — NITROGLYCERIN 0.4 MG SL SUBL: 0.4000 mg | SUBLINGUAL_TABLET | SUBLINGUAL | 2 refills | Status: DC | PRN

## 2020-01-11 NOTE — Patient Instructions (Signed)
Medication Instructions:  Refill sent to the pharmacy electronically.  *If you need a refill on your cardiac medications before your next appointment, please call your pharmacy*   Lab Work: If you have labs (blood work) drawn today and your tests are completely normal, you will receive your results only by: Marland Kitchen MyChart Message (if you have MyChart) OR . A paper copy in the mail If you have any lab test that is abnormal or we need to change your treatment, we will call you to review the results.   Testing/Procedures: Your physician has requested that you have an ankle brachial index (ABI). During this test an ultrasound and blood pressure cuff are used to evaluate the arteries that supply the arms and legs with blood. Allow thirty minutes for this exam. There are no restrictions or special instructions.[facility]     Follow-Up: At [facility], you and your health needs are our priority.  As part of our continuing mission to provide you with exceptional heart care, we have created designated Provider Care Teams.  These Care Teams include your primary Cardiologist (physician) and Advanced Practice Providers (APPs -  Physician Assistants and Nurse Practitioners) who all work together to provide you with the care you need, when you need it.  We recommend signing up for the patient portal called "MyChart".  Sign up information is provided on this After Visit Summary.  MyChart is used to connect with patients for Virtual Visits (Telemedicine).  Patients are able to view lab/test results, encounter notes, upcoming appointments, etc.  Non-urgent messages can be sent to your provider as well.   To learn more about what you can do with MyChart, go to [email or web address].    Your next appointment:   6 month(s)  The format for your next appointment:   In Person  Provider:   Sanda Klein, MD PACER CHECK

## 2020-01-11 NOTE — Progress Notes (Signed)
Patient ID: David [name], male   DOB: 04-11-1933, 86 y.o.   MRN: [id number] Patient ID: David [name], male   DOB: 1932/11/10, 85 y.o.   MRN: [id number]    Cardiology Office Note    Date:  01/11/2020   ID:  David [name], DOB Jan 15, 1933, MRN [id number]  PCP:  Mayra Neer, MD  Cardiologist:  Quay Burow, MD (PAD);  Sanda Klein, MD   No chief complaint on file. Pacemaker  History of Present Illness:  David [name] is a 86 y.o. male David [name] is a 86 y.o. male who presents for follow-up on his pacemaker, pacemaker dependent due to complete heart block. He also has coronary artery disease, PAD of the lower extremities, symptomatic PVCs (and one episode of lengthy nonsustained VT detected by his pacemaker).  [place] model number I9618080, serial number [id number] Right atrial lead (new) [employer] MRI, model number 1324, serial number [id number] Right ventricular lead (new) [employer] MRI, model number [id number], serial number [id number].  David [name] is describing exertional angina that occurs only with greater than usual physical activity.  For example this occurs when he rushes taking out the garbage, up an incline.  If he stops the symptoms resolved promptly.  He has not had to take any nitroglycerin.  He did notice improvement in symptoms on isosorbide mononitrate 30 mg compared with previous baseline.  He never has chest discomfort at rest or unprovoked.  He is also developed some neuropathic symptoms in both his feet.  This is usually more prominent in his left foot and is described primarily as paresthesia, rather than pain.  It is worsened after he walks for long time, but can also be provoked by standing up for long period of time without moving.  It appears to be different than his previous complaint of intermittent claudication (also more prominent in his left calf, but more of a charley horse type sensation).  He occasionally trips over the toes  of his left foot.  He has not had urinary or fecal incontinence and denies saddle anesthesia.  He has known occlusion of the popliteal stents in his left lower extremity.  He has undergone cystoscopic resection of a bladder tumor and bilateral cataract surgery.  Lymphoma still in remission  Pacemaker interrogation shows 9 years of estimated generator longevity. He has  95% ventricular pacing (rest PVCs) and has 66% atrial pacing. Pacemaker dependent, no detectable R waves.  He has sporadic and brief episodes of paroxysmal atrial tachycardia, but no atrial fibrillation (overall burden well under 1%).  He has had only one episode of high ventricular rates since his last in-person device check, which occurred in December 2019.  His most recent lipid profile in November 2020 shows total cholesterol 126, LDL 56, HDL 38.  His creatinine is 1.21 and his liver function tests were normal.  Mr. Falkner has had numerous coronary revascularization procedures, most recently a stent in the saphenous vein graft to the oblique marginal artery in July 2014. He is now almost 20 years status post coronary bypass surgery. The same bypass graft had received a stent in 2007. He has mild ischemic cardiomyopathy with an ejection fraction of 40-45%, without clinical  heart failure. The nuclear stress test on July 28, 2015 shows old scar in the apex and inferior wall. LVEF is calculated at 43% (identical to 2014 and 2015).  He had occlusion of the left popliteal artery with distal reconstitution via collaterals. He  had successful recanalization with placement of 2 stents by Dr. Andree Elk in 2015 . This led to marked improvement in his claudication, but the stents subsequently occluded He is pacemaker dependent secondary to complete heart block. He had a generator changeout in 2017. Interrogation of his device today again shows some issues with "noise" on the atrial channel but none on the ventricular channel.  Moderate carotid  artery disease (stable 60-79% proximal internal carotid artery stenosis on the right, mild plaque on the left), last duplex US was February 11, 2015.  He has a history of non-Hodgkin's lymphoma with a large neck mass but also mediastinal and abdominal lymphadenopathy treated with radiotherapy to the cervical area in 2011 followed by chemotherapy with Rituxan and fludarabine. He is considered to be in remission.     Past Medical History:  Diagnosis Date  . Arthritis    "hands" (10/19/2013)  . Bladder tumor   . CAD (coronary artery disease)    cardiologist-  dr crouitoru  . Carotid artery disease (HCC)    moderate pRICA 62-37%,  6-28% LICA , >31% RECA last duplex 08-05-2015  . Cataract of both eyes   . Complete heart block ([place])   . GERD (gastroesophageal reflux disease)   . Glaucoma, both eyes   . Hematuria   . History of MI (myocardial infarction)    1989  . Hyperlipidemia   . Hypertension   . Ischemic cardiomyopathy    ef 40-45%  without clinical heart failure per cardiology note 12-28-2015 (nuclear stress ef 43% from 07-28-2015)  . Left ventricular dysfunction   . Non Hodgkin's lymphoma ([place]) dx 06/2010---  oncologist-  dr Benay Spice---  in clinical remission since 2013   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 -- lymphadenopathy to neck, mediastinal , and abdominal--  chemotherapy completed 06/ 2011  . NSVT (nonsustained ventricular tachycardia) [facility]) cardiologist-  dr crouitoru   Max rate 200 bpm, 28-beat, nearly 9-second episode recorded by pacemaker on November 15, asymptomatic   . Pacemaker pacemaker dependant due to CHB   1st Pacemaker Placement 1998 w/ New generator  12-31-2000 ; 08-04-2008;  11-29-2015  . PAD (peripheral artery disease) ([place])    followed by dr berry--- last duplex 08-05-2015  patents bilateral popliteal stents, bilateral SFA 30-49% stenosis  . Peripheral vascular disease with claudication ([place])    dx 2009 left popliteal occlusion with  collaterals; 03- 2015  s/p  stenting right popliteal  by dr berry and left popliteal stenting by dr Andree Elk ([facility]) july 2015  . S/P CABG x 5    09-10-1988  . S/P drug eluting coronary stent placement 01/04/2006   01-04-2006--- DES to SVG to OM1, OM2, and IR, and SVG to PDA(x2 grafts)  and 03-04-2013  DES to SVR to OM  . Wears glasses     Past Surgical History:  Procedure Laterality Date  . ANAL FISSURE REPAIR  12-09-2000 and 1970's  . CARDIAC CATHETERIZATION  04/09/2000   patent grafts  . CARDIAC PACEMAKER PLACEMENT  11-15-1993   dr Cyndia Bent  . CARDIOVASCULAR STRESS TEST  07-28-2015  dr crouitoru   Intermediate risk nuclear study w/ large, severe, predominantly fixed inferior and apical defect; minimal reversibility inferior lateral wall; findings consistent w/ large prior infarct and trivial peri-infarct ischemia;  ef 43% w/ inferior hypokinesis and apical akinesis; mild LVE  . CORONARY ANGIOPLASTY WITH STENT PLACEMENT  01-04-2006  dr gamble   DES to pSVG to OM1, SVG to OM2 &  IR,  and mSVG to RCA dPDA (x2 grafts );  pLAD , pLCFX and  mRCA occluded;  LIMA to LAD mid-distal patent with occlusion beyond LIMA with collateral flow;  ef 30%  . CORONARY ARTERY BYPASS GRAFT  09/10/1988   dr Redmond Pulling   "CABG X5" (03/04/2013) LIMA to LAD,SVG to imtermediate OM1 OM II,SVG to PDA  . CYSTOSCOPY W/ RETROGRADES Bilateral 04/13/2016   Procedure: CYSTOSCOPY WITH RETROGRADE PYELOGRAM;  Surgeon: Alexis Frock, MD;  Location: [facility];  Service: Urology;  Laterality: Bilateral;  . EP IMPLANTABLE DEVICE N/A 11/29/2015   Procedure: Pacemaker Implant;  Surgeon: Sanda Klein, MD;  Location: [place] [facility];  Service: Cardiovascular;  Laterality: N/A;  new generator and new atrial, vertricular leads;  Patent attorney  . LEFT HEART CATHETERIZATION WITH CORONARY/GRAFT ANGIOGRAM N/A 03/04/2013   Procedure: LEFT HEART CATHETERIZATION WITH Beatrix Fetters;  Surgeon: Troy Sine,  MD;  Location: [facility] CATH LAB;  Service: Cardiovascular;  Laterality: N/A;  . LOWER EXTREMITY ANGIOGRAM N/A 08/31/2013   Procedure: LOWER EXTREMITY ANGIOGRAM;  Surgeon: Lorretta Harp, MD;  Location: [facility] CATH LAB;  Service: Cardiovascular;  Laterality: N/A;  . PACEMAKER REPLACEMENT  12/31/2000;  08-04-2008  . PERCUTANEOUS CORONARY STENT INTERVENTION (PCI-S)  03/04/2013   Procedure: PERCUTANEOUS CORONARY STENT INTERVENTION (PCI-S);  Surgeon: Troy Sine, MD;  Location: [facility] CATH LAB;  Service: Cardiovascular;;  DES to S-OM for in-stent restenosis  . POPLITEAL ARTERY STENT Right 10-21-2013  dr berry   atherectomy, PTA , and IDEV stent x1  . POPLITEAL ARTERY STENT Left 07/ 2015  dr Andree Elk at [facility]   angioplasty / stent x2  . 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 SUPERFICIAL LYMPH NODE BIOPSY / EXCISION Left 2011   cervical lymph node  . TRANSURETHRAL RESECTION OF BLADDER TUMOR N/A 04/13/2016   Procedure: TRANSURETHRAL RESECTION OF BLADDER TUMOR (TURBT) with clot evacuation;  Surgeon: Alexis Frock, MD;  Location: [facility];  Service: Urology;  Laterality: N/A;  . VENOGRAM Left 11/29/2015   Procedure: Venogram;  Surgeon: Sanda Klein, MD;  Location: [place] [facility];  Service: Cardiovascular;  Laterality: Left;    Outpatient Medications Prior to Visit  Medication Sig Dispense Refill  . aspirin EC 81 MG tablet Take 81 mg by mouth daily.    . cetirizine (ZYRTEC) 10 MG tablet Take 10 mg by mouth every evening.     . cilostazol (PLETAL) 100 MG tablet TAKE 1 TABLET BY MOUTH TWO  TIMES DAILY 180 tablet 3  . Glucosamine-Chondroitin 750-600 MG TABS Take 1 tablet by mouth 2 (two) times daily.    Marland Kitchen icosapent Ethyl (VASCEPA) 1 g capsule Take 2 capsules (2 g total) by mouth 2 (two) times daily. 360 capsule 3  . isosorbide mononitrate (IMDUR) 30 MG 24 hr tablet Take 1 tablet (30 mg total) by mouth daily. 90 tablet 2  . lisinopril (PRINIVIL,ZESTRIL) 20 MG tablet  Take 20 mg by mouth every morning.     . metoprolol tartrate (LOPRESSOR) 50 MG tablet TAKE 1 AND 1/2 TABLETS BY  MOUTH TWICE DAILY 270 tablet 1  . Multiple Vitamins-Minerals (SENIOR MULTIVITAMIN PLUS PO) Take 1 tablet by mouth daily.    . pantoprazole (PROTONIX) 40 MG tablet TAKE 1 TABLET BY MOUTH  DAILY 90 tablet 3  . predniSONE (STERAPRED UNI-PAK 21 TAB) 10 MG (21) TBPK tablet Take as directed 21 tablet 0  . rosuvastatin (CRESTOR) 40 MG tablet TAKE 1 TABLET BY  MOUTH AT  BEDTIME 90 tablet 3  . traMADol (ULTRAM) 50 MG tablet Take 50 mg by mouth every 8 (eight) hours.    . nitroGLYCERIN (NITROSTAT) 0.4 MG SL tablet Place 1 tablet (0.4 mg total) under the tongue every 5 (five) minutes as needed for chest pain. 25 tablet 2   No facility-administered medications prior to visit.     Allergies:   Azithromycin, Lactose intolerance (gi), Niacin and related, and Sulfa antibiotics   Social History   Socioeconomic History  . Marital status: Married    Spouse name: Not on file  . Number of children: Not on file  . Years of education: Not on file  . Highest education level: Not on file  Occupational History  . Not on file  Tobacco Use  . Smoking status: Former Smoker    Packs/day: 0.12    Years: 1.00    Pack years: 0.12    Types: Cigarettes    Quit date: 08/06/1956    Years since quitting: 63.4  . Smokeless tobacco: Never Used  Substance and Sexual Activity  . Alcohol use: No  . Drug use: No  . Sexual activity: Never  Other Topics Concern  . Not on file  Social History Narrative  . Not on file   Social Determinants of Health   Financial Resource Strain:   . Difficulty of Paying Living Expenses:   Food Insecurity:   . Worried About Charity fundraiser in the Last Year:   . Arboriculturist in the Last Year:   Transportation Needs:   . Film/video editor (Medical):   Marland Kitchen Lack of Transportation (Non-Medical):   Physical Activity:   . Days of Exercise per Week:   . Minutes of  Exercise per Session:   Stress:   . Feeling of Stress :   Social Connections:   . Frequency of Communication with Friends and Family:   . Frequency of Social Gatherings with Friends and Family:   . Attends Religious Services:   . Active Member of Clubs or Organizations:   . Attends Archivist Meetings:   Marland Kitchen Marital Status:      Family History:  The patient's family history includes Diabetes in his mother; Heart attack in his father; Hyperlipidemia in his sister and sister; Hypertension in his sister and sister; Stroke in his mother.   ROS:   Please see the history of present illness.    ROS All other systems are reviewed and are negative.  PHYSICAL EXAM:   VS:  BP 112/60   Pulse 65   Temp (!) 97.2 F (36.2 C)   Ht 5' 9.5" (1.765 m)   Wt 204 lb 6.4 oz (92.7 kg)   SpO2 94%   BMI 29.75 kg/m      General: Alert, oriented x3, no distress, overweight.  Healthy subclavian pacemaker site. Head: no evidence of trauma, PERRL, EOMI, no exophtalmos or lid lag, no myxedema, no xanthelasma; normal ears, nose and oropharynx Neck: normal jugular venous pulsations and no hepatojugular reflux; brisk carotid pulses without delay and faint bilateral carotid bruits Chest: clear to auscultation, no signs of consolidation by percussion or palpation, normal fremitus, symmetrical and full respiratory excursions Cardiovascular: normal position and quality of the apical impulse, regular rhythm, normal first and paradoxically split second heart sounds, no murmurs, rubs or gallops Abdomen: no tenderness or distention, no masses by palpation, no abnormal pulsatility or arterial bruits, normal bowel sounds, no hepatosplenomegaly Extremities: no clubbing, cyanosis  or edema Neurological: grossly nonfocal Psych: Normal mood and affect   Wt Readings from Last 3 Encounters:  01/11/20 204 lb 6.4 oz (92.7 kg)  09/22/19 202 lb (91.6 kg)  02/13/19 200 lb 11.2 oz (91 kg)    Studies/Labs Reviewed:    EKG:  EKG is not ordered today.  Recent Labs: 02/13/2019: Hemoglobin 14.7; Platelet Count 175   Lipid Panel  November 2020 shows total cholesterol 126, LDL 56, HDL 38.  His creatinine is 1.21 and his liver function tests were normal.     Component Value Date/Time   CHOL 119 06/24/2014 0855   TRIG 185 (H) 06/24/2014 0855   HDL 35 (L) 06/24/2014 0855   CHOLHDL 3.4 06/24/2014 0855   VLDL 37 06/24/2014 0855   LDLCALC 47 06/24/2014 0855   ASSESSMENT:    1. PAD (peripheral artery disease) (HCC)   2. Claudication in peripheral vascular disease ([place])   3. Coronary artery disease of autologous vein bypass graft with stable angina pectoris ([place])   4. CHB (complete heart block) (HCC)   5. Cardiomyopathy, ischemic- EF 40-45% by echo 4/14   6. NSVT (nonsustained ventricular tachycardia) (HCC)   7. Essential hypertension   8. Asymptomatic stenosis of right carotid artery without infarction   9. Dyslipidemia   10. Grade 1 follicular lymphoma of lymph nodes of multiple regions [facility]      PLAN:  In order of problems listed above:  1. CAD s/p CABG 1995, s/p stent SVG-OM 2007 and July 2014: He describes a predictable pattern of stable angina CCS class II, unchanged for several years.  Offered to increase the dose of isosorbide but he does not think this is necessary..  He had a low risk nuclear study in December 2016.  2. CHB: Pacemaker dependent 3. PAD: He is concerned that his neuropathic symptoms are due to recurrent obstruction in his lower extremity, but to me sounds more likely that he has spinal stenosis.  We will check ABI, but if these are unchanged will refer to a spine specialist. 4. Pacemaker: He had new leads implanted in the new generator in 2017, but the old unipolar leads are still in place and he cannot have an MRI scan, unfortunately. 5. Cardiomyopathy, ischemic - EF 43%: Clinically euvolemic without any signs or symptoms of heart failure, not requiring diuretics. 6.   PVCs/NSVT: Continues have a fairly high burden of PVCs, but he has not had recorded in SVT in a year and a half.  On beta-blockers. 7. HTN: Very well controlled. 8. Asymptomatic stenosis of right carotid artery without infarction: stable moderate stenosis by repeat carotid duplex ultrasound in February 2021. 9. HLP:   Excellent LDL level, HDL remains borderline low. 10. Lymphoma in remission  Medication Adjustments/Labs and Tests Ordered: Current medicines are reviewed at length with the patient today.  Concerns regarding medicines are outlined above.  Medication changes, Labs and Tests ordered today are listed in the Patient Instructions below. Patient Instructions  Medication Instructions:  Refill sent to the pharmacy electronically.  *If you need a refill on your cardiac medications before your next appointment, please call your pharmacy*   Lab Work: If you have labs (blood work) drawn today and your tests are completely normal, you will receive your results only by: Marland Kitchen MyChart Message (if you have MyChart) OR . A paper copy in the mail If you have any lab test that is abnormal or we need to change your treatment, we will call you to review the  results.   Testing/Procedures: Your physician has requested that you have an ankle brachial index (ABI). During this test an ultrasound and blood pressure cuff are used to evaluate the arteries that supply the arms and legs with blood. Allow thirty minutes for this exam. There are no restrictions or special instructions.[facility]     Follow-Up: At [facility], you and your health needs are our priority.  As part of our continuing mission to provide you with exceptional heart care, we have created designated Provider Care Teams.  These Care Teams include your primary Cardiologist (physician) and Advanced Practice Providers (APPs -  Physician Assistants and Nurse Practitioners) who all work together to provide you with the care you need,  when you need it.  We recommend signing up for the patient portal called "MyChart".  Sign up information is provided on this After Visit Summary.  MyChart is used to connect with patients for Virtual Visits (Telemedicine).  Patients are able to view lab/test results, encounter notes, upcoming appointments, etc.  Non-urgent messages can be sent to your provider as well.   To learn more about what you can do with MyChart, go to [email or web address].    Your next appointment:   6 month(s)  The format for your next appointment:   In Person  Provider:   Sanda Klein, MD [address]         Signed, Sanda Klein, MD  01/11/2020 4:39 PM    [address], [place], [place]  [address] Phone: [phone]; Fax: [phone]

## 2020-02-03 ENCOUNTER — Other Ambulatory Visit (HOSPITAL_COMMUNITY): Payer: Self-pay | Admitting: Cardiovascular Disease

## 2020-02-03 DIAGNOSIS — I739 Peripheral vascular disease, unspecified: Secondary | ICD-10-CM

## 2020-02-05 ENCOUNTER — Other Ambulatory Visit: Payer: Self-pay

## 2020-02-05 ENCOUNTER — Ambulatory Visit (HOSPITAL_COMMUNITY)
Admission: RE | Admit: 2020-02-05 | Discharge: 2020-02-05 | Disposition: A | Discharge status: Home / Self Care | Payer: Medicare Other | Source: Ambulatory Visit | Attending: Cardiology | Admitting: Cardiology

## 2020-02-05 ENCOUNTER — Other Ambulatory Visit (HOSPITAL_COMMUNITY): Payer: Self-pay | Admitting: Cardiovascular Disease

## 2020-02-05 DIAGNOSIS — I739 Peripheral vascular disease, unspecified: Secondary | ICD-10-CM

## 2020-02-10 ENCOUNTER — Telehealth: Payer: Self-pay | Admitting: Cardiovascular Disease

## 2020-02-10 ENCOUNTER — Encounter: Payer: Self-pay | Admitting: *Deleted

## 2020-02-10 DIAGNOSIS — G609 Hereditary and idiopathic neuropathy, unspecified: Secondary | ICD-10-CM

## 2020-02-10 NOTE — Telephone Encounter (Signed)
     Pt c/o medication issue:  1. Name of Medication: isosorbide mononitrate (IMDUR) 30 MG 24 hr tablet  2. How are you currently taking this medication (dosage and times per day)? Take 1 tablet (30 mg total) by mouth daily.  3. Are you having a reaction (difficulty breathing--STAT)?   4. What is your medication issue? Pt said he's been taking 2 tablets a day instead of 1 and it is very effective for him. He wants new prescription sent to [facility], [place], [address] with new script so he can take 2 tablets daily

## 2020-02-10 NOTE — Telephone Encounter (Signed)
Attempted to call patient regardinghis Imdur Rx-no answer and VM box has not been set up yet so unable to leave a message at this time

## 2020-02-12 MED ORDER — ISOSORBIDE MONONITRATE ER 30 MG PO TB24: 30.0000 mg | ORAL_TABLET | Freq: Two times a day (BID) | ORAL | 3 refills | Status: DC

## 2020-02-12 MED ORDER — ISOSORBIDE MONONITRATE ER 60 MG PO TB24: 60.0000 mg | ORAL_TABLET | Freq: Every day | ORAL | 3 refills | Status: DC

## 2020-02-12 NOTE — Telephone Encounter (Signed)
David [name] is returning Christine's call. Please advise.

## 2020-02-12 NOTE — Addendum Note (Signed)
Addended by: Devra Dopp E on: 02/12/2020 10:54 AM   Modules accepted: Orders

## 2020-02-12 NOTE — Addendum Note (Signed)
Addended by: Devra Dopp E on: 02/12/2020 01:14 PM   Modules accepted: Orders

## 2020-02-12 NOTE — Telephone Encounter (Signed)
Spoke with pt and since last Sat has increased Isosorbide to 60 mg and has been doing well with this change  this nurse wanted to make sure this was okay Also pt was asking if referral has been ordered in regards to the numbness in legs.Will forward to Dr [name] for review and recommendations ./cy

## 2020-02-12 NOTE — Telephone Encounter (Signed)
Detailed message left with Dr Victorino December response./cy

## 2020-02-12 NOTE — Telephone Encounter (Signed)
Pt called back and gave info from Dr [name] .Adonis Housekeeper

## 2020-02-12 NOTE — Telephone Encounter (Signed)
Keep on isosorbide 60 mg, send new Rx if needed, please.  I am not sure about referral. Will double check. [name], if we have not done so, could we please refer to [facility] for peripheral neuropathy? Maybe Dr. Jannifer Franklin or Dr. Leta Baptist.

## 2020-02-15 ENCOUNTER — Other Ambulatory Visit: Payer: Self-pay

## 2020-02-15 ENCOUNTER — Inpatient Hospital Stay: Payer: Medicare Other | Attending: Oncology | Admitting: Oncology

## 2020-02-15 ENCOUNTER — Inpatient Hospital Stay: Payer: Medicare Other

## 2020-02-15 ENCOUNTER — Telehealth: Payer: Self-pay | Admitting: Oncology

## 2020-02-15 VITALS — BP 142/63 | HR 83 | Temp 97.7°F | Resp 16 | Ht 69.5 in | Wt 202.6 lb

## 2020-02-15 DIAGNOSIS — M79669 Pain in unspecified lower leg: Secondary | ICD-10-CM | POA: Diagnosis not present

## 2020-02-15 DIAGNOSIS — R200 Anesthesia of skin: Secondary | ICD-10-CM | POA: Insufficient documentation

## 2020-02-15 DIAGNOSIS — I2510 Atherosclerotic heart disease of native coronary artery without angina pectoris: Secondary | ICD-10-CM | POA: Insufficient documentation

## 2020-02-15 DIAGNOSIS — I255 Ischemic cardiomyopathy: Secondary | ICD-10-CM

## 2020-02-15 DIAGNOSIS — Z9221 Personal history of antineoplastic chemotherapy: Secondary | ICD-10-CM | POA: Diagnosis not present

## 2020-02-15 DIAGNOSIS — C8208 Follicular lymphoma grade I, lymph nodes of multiple sites: Secondary | ICD-10-CM

## 2020-02-15 DIAGNOSIS — I739 Peripheral vascular disease, unspecified: Secondary | ICD-10-CM

## 2020-02-15 DIAGNOSIS — Z8572 Personal history of non-Hodgkin lymphomas: Secondary | ICD-10-CM | POA: Insufficient documentation

## 2020-02-15 LAB — CBC WITH DIFFERENTIAL (CANCER CENTER ONLY)
Abs Immature Granulocytes: 0.03 10*3/uL (ref 0.00–0.07)
Basophils Absolute: 0 10*3/uL (ref 0.0–0.1)
Basophils Relative: 1 %
Eosinophils Absolute: 0.2 10*3/uL (ref 0.0–0.5)
Eosinophils Relative: 4 %
HCT: 37.1 % — ABNORMAL LOW (ref 39.0–52.0)
Hemoglobin: 12.7 g/dL — ABNORMAL LOW (ref 13.0–17.0)
Immature Granulocytes: 1 %
Lymphocytes Relative: 20 %
Lymphs Abs: 1.1 10*3/uL (ref 0.7–4.0)
MCH: 31.1 pg (ref 26.0–34.0)
MCHC: 34.2 g/dL (ref 30.0–36.0)
MCV: 90.7 fL (ref 80.0–100.0)
Monocytes Absolute: 0.6 10*3/uL (ref 0.1–1.0)
Monocytes Relative: 10 %
Neutro Abs: 3.6 10*3/uL (ref 1.7–7.7)
Neutrophils Relative %: 64 %
Platelet Count: 152 10*3/uL (ref 150–400)
RBC: 4.09 MIL/uL — ABNORMAL LOW (ref 4.22–5.81)
RDW: 13.2 % (ref 11.5–15.5)
WBC Count: 5.6 10*3/uL (ref 4.0–10.5)
nRBC: 0 % (ref 0.0–0.2)

## 2020-02-15 NOTE — Progress Notes (Signed)
  [place] OFFICE PROGRESS NOTE   Diagnosis: Non-Hodgkin's lymphoma  INTERVAL HISTORY:   David [name] returns as scheduled.  He feels well.  No palpable lymph nodes.  He has numbness and pain in the lower legs and feet when he ambulates.  He does not feel this is related to claudication.  He has been referred to neurology.  Objective:  Vital signs in last 24 hours:  Blood pressure (!) 142/63, pulse 83, temperature 97.7 F (36.5 C), temperature source Temporal, resp. rate 16, height 5' 9.5" (1.765 m), weight 202 lb 9.6 oz (91.9 kg), SpO2 100 %.    HEENT: Neck without mass Lymphatics: No cervical, supraclavicular, axillary, or inguinal nodes Resp: Lungs clear bilaterally Cardio: Regular rate and rhythm GI: No mass, no hepatosplenomegaly, nontender Vascular: No leg edema  Lab Results:  Lab Results  Component Value Date   WBC 5.6 02/15/2020   HGB 12.7 (L) 02/15/2020   HCT 37.1 (L) 02/15/2020   MCV 90.7 02/15/2020   PLT 152 02/15/2020   NEUTROABS 3.6 02/15/2020    CMP  Lab Results  Component Value Date   NA 137 05/07/2016   K 4.4 05/07/2016   CL 105 05/07/2016   CO2 24 05/07/2016   GLUCOSE 121 (H) 05/07/2016   BUN 20 05/07/2016   CREATININE 1.18 05/07/2016   CALCIUM 9.2 05/07/2016   PROT 6.8 06/24/2014   ALBUMIN 4.6 06/24/2014   AST 26 06/24/2014   ALT 27 06/24/2014   ALKPHOS 47 06/24/2014   BILITOT 0.6 06/24/2014   GFRNONAA 56 (L) 05/07/2016   GFRAA >60 05/07/2016     Medications: I have reviewed the patient's current medications.   Assessment/Plan: 1. Non-Hodgkin's lymphoma, follicular B-cell, low grade, involving a left cervical lymph node with a staging evaluation revealing evidence for advanced stage disease. There was cervical, mediastinal, and abdominal lymphadenopathy and a question of splenic involvement on staging scans. He completed 4 cycles of fludarabine/rituximab with resolution of the palpable lymphadenopathy. The last chemotherapy  was given in June 2011.  2. Skin rash following cycle #1 of fludarabine/rituximab, likely related to allopurinol or Bactrim. The Bactrim prophylaxis was discontinued. 3. History of Chronic Thrombocytopenia secondary chemotherapy and non-Hodgkin's lymphoma-normal today 4. History of neutropenia secondary to chemotherapy. 5. Injection site reaction to a pneumococcal vaccine in October 2011. 6. History of coronary artery disease and peripheral vascular disease, followed by cardiology 7. papillary urothelial carcinoma, status post transurethral resection and intravesical mitomycin-C 04/13/2016     Disposition: David [name] is in remission from non-Hodgkin's lymphoma.  He would like to continue follow-up at the Cancer center.  He will return for an office visit in 1 year.  He will continue follow-up with cardiology and neurology for the lower extremity pain and numbness.  Betsy Coder, MD  02/15/2020  11:55 AM

## 2020-02-15 NOTE — Telephone Encounter (Signed)
Scheduled per 7/12 los. Printed avs for pt. Mychart active.

## 2020-02-26 ENCOUNTER — Other Ambulatory Visit: Payer: Self-pay | Admitting: Cardiovascular Disease

## 2020-02-26 MED ORDER — ISOSORBIDE MONONITRATE ER 30 MG PO TB24: 30.0000 mg | ORAL_TABLET | Freq: Two times a day (BID) | ORAL | 3 refills | Status: DC

## 2020-02-26 NOTE — Telephone Encounter (Signed)
° °*  STAT* If patient is at the pharmacy, call can be transferred to refill team.   1. Which medications need to be refilled? (please list name of each medication and dose if known)   isosorbide mononitrate--per 02/10/20 phone note patient is to take 60 mg per day and needs a new prescription reflecting this change  2. Which pharmacy/location (including street and city if local pharmacy) is medication to be sent to? [place], [place] [employer], [address]  3. Do they need a 30 day or 90 day supply? 90 day

## 2020-02-29 ENCOUNTER — Other Ambulatory Visit: Payer: Self-pay | Admitting: Cardiovascular Disease

## 2020-03-03 ENCOUNTER — Other Ambulatory Visit: Payer: Self-pay | Admitting: Cardiovascular Disease

## 2020-03-22 ENCOUNTER — Ambulatory Visit (INDEPENDENT_AMBULATORY_CARE_PROVIDER_SITE_OTHER): Payer: Medicare Other | Admitting: *Deleted

## 2020-03-22 DIAGNOSIS — I447 Left bundle-branch block, unspecified: Secondary | ICD-10-CM | POA: Diagnosis not present

## 2020-03-22 LAB — CUP PACEART REMOTE DEVICE CHECK
Battery Remaining Longevity: 102 mo
Battery Remaining Percentage: 100 %
Brady Statistic RA Percent Paced: 70 %
Brady Statistic RV Percent Paced: 89 %
Date Time Interrogation Session: 20210817022200
Implantable Lead Implant Date: 20170425
Implantable Lead Implant Date: 20170425
Implantable Lead Location: 753859
Implantable Lead Location: 753860
Implantable Lead Model: 7741
Implantable Lead Model: 7742
Implantable Lead Serial Number: 720495
Implantable Lead Serial Number: 763888
Implantable Pulse Generator Implant Date: 20170425
Lead Channel Impedance Value: 668 Ohm
Lead Channel Impedance Value: 763 Ohm
Lead Channel Pacing Threshold Amplitude: 0.8 V
Lead Channel Pacing Threshold Pulse Width: 0.4 ms
Lead Channel Setting Pacing Amplitude: 1.2 V
Lead Channel Setting Pacing Amplitude: 2.5 V
Lead Channel Setting Pacing Pulse Width: 0.4 ms
Lead Channel Setting Sensing Sensitivity: 2.5 mV
Pulse Gen Serial Number: 718025

## 2020-03-24 NOTE — Progress Notes (Signed)
Remote pacemaker transmission.   

## 2020-03-28 ENCOUNTER — Other Ambulatory Visit: Payer: Self-pay

## 2020-03-28 ENCOUNTER — Telehealth: Payer: Self-pay | Admitting: Cardiovascular Disease

## 2020-03-28 MED ORDER — ISOSORBIDE MONONITRATE ER 30 MG PO TB24: 30.0000 mg | ORAL_TABLET | Freq: Two times a day (BID) | ORAL | 3 refills | Status: DC

## 2020-03-28 NOTE — Telephone Encounter (Signed)
Pt c/o medication issue:  1. Name of Medication: isosorbide mononitrate (IMDUR) 30 MG 24 hr tablet  2. How are you currently taking this medication (dosage and times per day)? Take 1 tablet (30 mg total) by mouth in the morning and at bedtime.  3. Are you having a reaction (difficulty breathing--STAT)?   4. What is your medication issue? [facility] is telling him he can't take this twice a day.

## 2020-03-28 NOTE — Telephone Encounter (Signed)
The patient stated that [facility] was giving him a hard time filling the Isosorbide Mononitrate 30 bid. He has requested this be sent into [facility] instead. He has been advised to call back if he has nay further needs.

## 2020-03-30 ENCOUNTER — Other Ambulatory Visit: Payer: Self-pay

## 2020-04-05 ENCOUNTER — Other Ambulatory Visit: Payer: Self-pay | Admitting: Cardiovascular Disease

## 2020-04-08 ENCOUNTER — Encounter: Payer: Self-pay | Admitting: Neurology

## 2020-04-08 ENCOUNTER — Ambulatory Visit (INDEPENDENT_AMBULATORY_CARE_PROVIDER_SITE_OTHER): Payer: Medicare Other | Admitting: Neurology

## 2020-04-08 ENCOUNTER — Other Ambulatory Visit: Payer: Self-pay

## 2020-04-08 VITALS — BP 132/67 | HR 62 | Ht 69.5 in | Wt 203.0 lb

## 2020-04-08 DIAGNOSIS — R0989 Other specified symptoms and signs involving the circulatory and respiratory systems: Secondary | ICD-10-CM | POA: Insufficient documentation

## 2020-04-08 DIAGNOSIS — M48062 Spinal stenosis, lumbar region with neurogenic claudication: Secondary | ICD-10-CM | POA: Diagnosis not present

## 2020-04-08 DIAGNOSIS — I255 Ischemic cardiomyopathy: Secondary | ICD-10-CM | POA: Diagnosis not present

## 2020-04-08 DIAGNOSIS — Z23 Encounter for immunization: Secondary | ICD-10-CM | POA: Diagnosis not present

## 2020-04-08 HISTORY — DX: Other specified symptoms and signs involving the circulatory and respiratory systems: R09.89

## 2020-04-08 NOTE — Progress Notes (Signed)
Reason for visit: Leg numbness  Referring physician: Dr. Robbie Lis is a 86 y.o. male  History of present illness:  David [name] is an 86 year old right-handed white male with a history of non-Hodgkin's lymphoma treated with chemotherapy 10 years ago.  The patient has a history of coronary artery disease and peripheral vascular disease, he has had stents placed in the legs bilaterally.  Prior to the stent placement, he was having claudication symptoms with discomfort and cramping in the calf muscles.  Since the stents have been placed, this has resolved.  However, over the last 1 year he has noted a different problem.  When he walks for more than 10 to 15 minutes he now gets numbness below the knees bilaterally mainly anteriorly that completely goes away with rest.  He reports no leg cramps at this point.  He does have some occasional back pain but not severe.  He reports in the morning that he has some discomfort in the buttocks area but when he gets up and walks a bit this seems to improve.  He denies any problems with controlling the bowels or the bladder.  He has had mild balance troubles but no significant worsening of this over time.  He denies any neck pain or pain down the arms.  He has noted over the last year that the discomfort and numbness in the legs is coming on with less walking.  He is sent to this office for an evaluation.  The patient has a pacemaker in place, he cannot have MRI evaluation.  Past Medical History:  Diagnosis Date  . Arthritis    "hands" (10/19/2013)  . Bladder tumor   . CAD (coronary artery disease)    cardiologist-  dr crouitoru  . Carotid artery disease (HCC)    moderate pRICA 38-25%,  0-53% LICA , >97% RECA last duplex 08-05-2015  . Cataract of both eyes   . Complete heart block ([place])   . GERD (gastroesophageal reflux disease)   . Glaucoma, both eyes   . Hematuria   . History of MI (myocardial infarction)    1989  . Hyperlipidemia   .  Hypertension   . Ischemic cardiomyopathy    ef 40-45%  without clinical heart failure per cardiology note 12-28-2015 (nuclear stress ef 43% from 07-28-2015)  . Left ventricular dysfunction   . Non Hodgkin's lymphoma ([place]) dx 06/2010---  oncologist-  dr Benay Spice---  in clinical remission since 2013   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 -- lymphadenopathy to neck, mediastinal , and abdominal--  chemotherapy completed 06/ 2011  . NSVT (nonsustained ventricular tachycardia) [facility]) cardiologist-  dr crouitoru   Max rate 200 bpm, 28-beat, nearly 9-second episode recorded by pacemaker on November 15, asymptomatic   . Pacemaker pacemaker dependant due to CHB   1st Pacemaker Placement 1998 w/ New generator  12-31-2000 ; 08-04-2008;  11-29-2015  . PAD (peripheral artery disease) ([place])    followed by dr berry--- last duplex 08-05-2015  patents bilateral popliteal stents, bilateral SFA 30-49% stenosis  . Peripheral vascular disease with claudication ([place])    dx 2009 left popliteal occlusion with collaterals; 03- 2015  s/p  stenting right popliteal  by dr berry and left popliteal stenting by dr Andree Elk ([facility]) july 2015  . S/P CABG x 5    09-10-1988  . S/P drug eluting coronary stent placement 01/04/2006   01-04-2006--- DES to SVG to OM1, OM2, and IR, and SVG  to PDA(x2 grafts)  and 03-04-2013  DES to SVR to OM  . Wears glasses     Past Surgical History:  Procedure Laterality Date  . ANAL FISSURE REPAIR  12-09-2000 and 1970's  . CARDIAC CATHETERIZATION  04/09/2000   patent grafts  . CARDIAC PACEMAKER PLACEMENT  11-15-1993   dr Cyndia Bent  . CARDIOVASCULAR STRESS TEST  07-28-2015  dr crouitoru   Intermediate risk nuclear study w/ large, severe, predominantly fixed inferior and apical defect; minimal reversibility inferior lateral wall; findings consistent w/ large prior infarct and trivial peri-infarct ischemia;  ef 43% w/ inferior hypokinesis and apical akinesis; mild LVE  .  CORONARY ANGIOPLASTY WITH STENT PLACEMENT  01-04-2006  dr gamble   DES to pSVG to OM1, SVG to OM2 &  IR, and mSVG to RCA dPDA (x2 grafts );  pLAD , pLCFX and  mRCA occluded;  LIMA to LAD mid-distal patent with occlusion beyond LIMA with collateral flow;  ef 30%  . CORONARY ARTERY BYPASS GRAFT  09/10/1988   dr Redmond Pulling   "CABG X5" (03/04/2013) LIMA to LAD,SVG to imtermediate OM1 OM II,SVG to PDA  . CYSTOSCOPY W/ RETROGRADES Bilateral 04/13/2016   Procedure: CYSTOSCOPY WITH RETROGRADE PYELOGRAM;  Surgeon: Alexis Frock, MD;  Location: [facility];  Service: Urology;  Laterality: Bilateral;  . EP IMPLANTABLE DEVICE N/A 11/29/2015   Procedure: Pacemaker Implant;  Surgeon: Sanda Klein, MD;  Location: [place] [facility];  Service: Cardiovascular;  Laterality: N/A;  new generator and new atrial, vertricular leads;  Patent attorney  . LEFT HEART CATHETERIZATION WITH CORONARY/GRAFT ANGIOGRAM N/A 03/04/2013   Procedure: LEFT HEART CATHETERIZATION WITH Beatrix Fetters;  Surgeon: Troy Sine, MD;  Location: [facility] CATH LAB;  Service: Cardiovascular;  Laterality: N/A;  . LOWER EXTREMITY ANGIOGRAM N/A 08/31/2013   Procedure: LOWER EXTREMITY ANGIOGRAM;  Surgeon: Lorretta Harp, MD;  Location: [facility] CATH LAB;  Service: Cardiovascular;  Laterality: N/A;  . PACEMAKER REPLACEMENT  12/31/2000;  08-04-2008  . PERCUTANEOUS CORONARY STENT INTERVENTION (PCI-S)  03/04/2013   Procedure: PERCUTANEOUS CORONARY STENT INTERVENTION (PCI-S);  Surgeon: Troy Sine, MD;  Location: [facility] CATH LAB;  Service: Cardiovascular;;  DES to S-OM for in-stent restenosis  . POPLITEAL ARTERY STENT Right 10-21-2013  dr berry   atherectomy, PTA , and IDEV stent x1  . POPLITEAL ARTERY STENT Left 07/ 2015  [facility]ree [facility]k [facility]   angioplasty / stent x2  . 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 SUPERFICIAL LYMPH NODE BIOPSY / EXCISION Left 2011   cervical lymph node  . TRANSURETHRAL RESECTION OF  BLADDER TUMOR N/A 04/13/2016   Procedure: TRANSURETHRAL RESECTION OF BLADDER TUMOR (TURBT) with clot evacuation;  Surgeon: Alexis Frock, MD;  Location: [facility];  Service: Urology;  Laterality: N/A;  . VENOGRAM Left 11/29/2015   Procedure: Venogram;  Surgeon: Sanda Klein, MD;  Location: [place] [facility];  Service: Cardiovascular;  Laterality: Left;    Family History  Problem Relation Age of Onset  . Diabetes Mother   . Stroke Mother   . Heart attack Father   . Hypertension Sister   . Hyperlipidemia Sister   . Hypertension Sister   . Hyperlipidemia Sister     Social history:  reports that he quit smoking about 63 years ago. His smoking use included cigarettes. He has a 0.12 pack-year smoking history. He has never used smokeless tobacco. He reports that he does not drink alcohol and does not use drugs.  Medications:  Prior to Admission medications   Medication Sig Start Date End Date Taking? Authorizing Provider  aspirin EC 81 MG tablet Take 81 mg by mouth daily.    [provider]  cetirizine (ZYRTEC) 10 MG tablet Take 10 mg by mouth every evening.     [provider]  cilostazol (PLETAL) 100 MG tablet TAKE 1 TABLET BY MOUTH  TWICE DAILY 02/29/20   Croitoru, Mihai, MD  icosapent Ethyl (VASCEPA) 1 g capsule Take 2 capsules (2 g total) by mouth 2 (two) times daily. 12/28/19   Croitoru, Mihai, MD  isosorbide mononitrate (IMDUR) 30 MG 24 hr tablet Take 1 tablet (30 mg total) by mouth in the morning and at bedtime. 03/28/20   Croitoru, Mihai, MD  lisinopril (PRINIVIL,ZESTRIL) 20 MG tablet Take 20 mg by mouth every morning.     [provider]  metoprolol tartrate (LOPRESSOR) 50 MG tablet TAKE 1 AND 1/2 TABLETS BY  MOUTH TWICE DAILY 04/05/20   Croitoru, Mihai, MD  Multiple Vitamins-Minerals (SENIOR MULTIVITAMIN PLUS PO) Take 1 tablet by mouth daily.    [provider]  nitroGLYCERIN (NITROSTAT) 0.4 MG SL tablet Place 1 tablet (0.4 mg total)  under the tongue every 5 (five) minutes as needed for chest pain. Patient not taking: Reported on 02/15/2020 01/11/20   Croitoru, Dani Gobble, MD  OVER THE COUNTER MEDICATION Take 1 tablet by mouth daily. [facility]    [provider]  pantoprazole (PROTONIX) 40 MG tablet TAKE 1 TABLET BY MOUTH  DAILY 02/29/20   Croitoru, Mihai, MD  rosuvastatin (CRESTOR) 40 MG tablet TAKE 1 TABLET BY MOUTH AT  BEDTIME 06/23/19   Croitoru, Mihai, MD      Allergies  Allergen Reactions  . Azithromycin Diarrhea  . Lactose Intolerance (Gi) Diarrhea  . Niacin And Related Diarrhea  . Sulfa Antibiotics Rash    ROS:  Out of a complete 14 system review of symptoms, the patient complains only of the following symptoms, and all other reviewed systems are negative.  Leg numbness  Blood pressure 132/67, pulse 62, height 5' 9.5" (1.765 m), weight 203 lb (92.1 kg).  Physical Exam  General: The patient is alert and cooperative at the time of the examination.  Eyes: Pupils are equal, round, and reactive to light. Discs are flat bilaterally.  Neck: The neck is supple, bilateral carotid bruits are noted, right greater than left.  Respiratory: The respiratory examination is clear.  Cardiovascular: The cardiovascular examination reveals a regular rate and rhythm, no obvious murmurs or rubs are noted.  Skin: Extremities are without significant edema.  Neurologic Exam  Mental status: The patient is alert and oriented x 3 at the time of the examination. The patient has apparent normal recent and remote memory, with an apparently normal attention span and concentration ability.  Cranial nerves: Facial symmetry is present. There is good sensation of the face to pinprick and soft touch bilaterally. The strength of the facial muscles and the muscles to head turning and shoulder shrug are normal bilaterally. Speech is well enunciated, no aphasia or dysarthria is noted. Extraocular movements are full.  Visual fields are full. The tongue is midline, and the patient has symmetric elevation of the soft palate. No obvious hearing deficits are noted.  Motor: The motor testing reveals 5 over 5 strength of all 4 extremities. Good symmetric motor tone is noted throughout.  Sensory: Sensory testing is intact to pinprick, soft touch, vibration sensation, and position sense on all 4 extremities.  No evidence of extinction is noted.  Coordination: Cerebellar testing reveals good finger-nose-finger and heel-to-shin bilaterally.  Gait and station: Gait is slightly wide-based, the patient walks without assistance.  Tandem gait is unsteady.  Romberg is negative. No drift is seen.  The patient is able to walk on heels and the toes bilaterally.  Reflexes: Deep tendon reflexes are symmetric, but are somewhat depressed in the arms and at the ankles, preserved at the knees bilaterally. Toes are downgoing bilaterally.   Assessment/Plan:  1.  History of peripheral vascular disease  2.  Pseudoclaudication syndrome  3.  Bilateral carotid bruits  The patient has indicated that his vascular doctors do not believe that his current symptoms are related to vascular circulation in the legs.  The patient be sent for CT scan of the low back to exclude the possibility of lumbar spinal stenosis.  I will contact him when the results are available to me.  The patient is being actively followed for the bilateral carotid bruits.  Jill Alexanders MD 04/08/2020 9:12 AM  [facility] [address] [place], [place] [address]  Phone [phone] Fax [phone]

## 2020-04-12 ENCOUNTER — Telehealth: Payer: Self-pay | Admitting: Neurology

## 2020-04-12 NOTE — Telephone Encounter (Signed)
Medicare/aarp order sent to GI. No auth they will reach out to the patient to schedule.  

## 2020-04-14 DIAGNOSIS — Z961 Presence of intraocular lens: Secondary | ICD-10-CM | POA: Diagnosis not present

## 2020-04-14 DIAGNOSIS — H40053 Ocular hypertension, bilateral: Secondary | ICD-10-CM | POA: Diagnosis not present

## 2020-04-14 DIAGNOSIS — H43813 Vitreous degeneration, bilateral: Secondary | ICD-10-CM | POA: Diagnosis not present

## 2020-04-14 DIAGNOSIS — H52202 Unspecified astigmatism, left eye: Secondary | ICD-10-CM | POA: Diagnosis not present

## 2020-04-22 ENCOUNTER — Ambulatory Visit
Admission: RE | Admit: 2020-04-22 | Discharge: 2020-04-22 | Disposition: A | Discharge status: Home / Self Care | Payer: Medicare Other | Source: Ambulatory Visit | Attending: Neurology | Admitting: Neurology

## 2020-04-22 DIAGNOSIS — M5116 Intervertebral disc disorders with radiculopathy, lumbar region: Secondary | ICD-10-CM | POA: Diagnosis not present

## 2020-04-22 DIAGNOSIS — I700 Atherosclerosis of aorta: Secondary | ICD-10-CM | POA: Diagnosis not present

## 2020-04-22 DIAGNOSIS — M48062 Spinal stenosis, lumbar region with neurogenic claudication: Secondary | ICD-10-CM

## 2020-04-22 DIAGNOSIS — Z8551 Personal history of malignant neoplasm of bladder: Secondary | ICD-10-CM | POA: Diagnosis not present

## 2020-04-22 DIAGNOSIS — M48061 Spinal stenosis, lumbar region without neurogenic claudication: Secondary | ICD-10-CM | POA: Diagnosis not present

## 2020-04-24 ENCOUNTER — Telehealth: Payer: Self-pay | Admitting: Neurology

## 2020-04-24 DIAGNOSIS — M48062 Spinal stenosis, lumbar region with neurogenic claudication: Secondary | ICD-10-CM

## 2020-04-24 NOTE — Telephone Encounter (Signed)
I called the patient.  The CT of the lumbar spine does show evidence of lumbar spinal stenosis at the L4-5 level that is severe.  This is the likely cause of his pseudoclaudication syndrome.  I will start by getting epidural steroid injection, if this is not helpful, I will consider getting a surgical opinion regarding this.   The patient has a left renal cyst that was seen on a CT of the abdomen and pelvis 4 years ago.  It apparently has had some change with possible hemorrhage.  Not sure if this deserves further evaluation.  I will send this report to the primary care physician.   CT lumbar 04/17/20:  IMPRESSION: 1. No acute osseous abnormality of the lumbar spine. 2. Multilevel degenerative changes of the lumbar spine, detailed level by level above. 3. Maximal change L4-5 where there is severe canal stenosis, effacement the lateral recesses and severe foraminal narrowing with contact of both the traversing and exiting nerve roots at this level. 4. 1 cm hyperdense cyst in the upper pole left kidney previously fluid attenuation, likely interval development of hemorrhage or proteinaceous debris. If clinically warranted could be further evaluated with renal ultrasound. 5. Aortic Atherosclerosis (ICD10-I70.0).

## 2020-04-27 ENCOUNTER — Other Ambulatory Visit: Payer: Self-pay | Admitting: Neurology

## 2020-04-27 DIAGNOSIS — M48062 Spinal stenosis, lumbar region with neurogenic claudication: Secondary | ICD-10-CM

## 2020-04-28 ENCOUNTER — Ambulatory Visit
Admission: RE | Admit: 2020-04-28 | Discharge: 2020-04-28 | Disposition: A | Discharge status: Home / Self Care | Payer: Medicare Other | Source: Ambulatory Visit | Attending: Neurology | Admitting: Neurology

## 2020-04-28 ENCOUNTER — Other Ambulatory Visit: Payer: Self-pay

## 2020-04-28 ENCOUNTER — Other Ambulatory Visit: Payer: Self-pay | Admitting: Neurology

## 2020-04-28 DIAGNOSIS — M48062 Spinal stenosis, lumbar region with neurogenic claudication: Secondary | ICD-10-CM

## 2020-04-28 MED ORDER — METHYLPREDNISOLONE ACETATE 40 MG/ML INJ SUSP (RADIOLOG: 120.0000 mg | Freq: Once | INTRAMUSCULAR | Status: DC

## 2020-04-28 MED ORDER — IOPAMIDOL (ISOVUE-M 200) INJECTION 41%: 1.0000 mL | Freq: Once | INTRAMUSCULAR | Status: DC

## 2020-04-29 ENCOUNTER — Telehealth: Payer: Self-pay | Admitting: *Deleted

## 2020-04-29 NOTE — Telephone Encounter (Signed)
° °   [facility] Pre-operative Risk Assessment     Request for surgical clearance:  1. What type of surgery is being performed? Lumbar ESI   2. When is this surgery scheduled? TBD   3. What type of clearance is required (medical clearance vs. Pharmacy clearance to hold med vs. Both)? pharmacy  4. Are there any medications that need to be held prior to surgery and how long? Pletal x 4 days   5. Practice name and name of physician performing surgery? [facility]   6. What is the office phone number? [phone]   7.   What is the office fax number? [phone]  8.   Anesthesia type (None, local, MAC, general) ? local   Lajuan Godbee A [place] 04/29/2020, 5:29 PM  _________________________________________________________________

## 2020-05-02 ENCOUNTER — Other Ambulatory Visit: Payer: Self-pay | Admitting: Cardiovascular Disease

## 2020-05-02 NOTE — Telephone Encounter (Signed)
It is fine with me to hold the Pletal for his lumbar discectomy

## 2020-05-02 NOTE — Telephone Encounter (Signed)
Please review. Mr. Matsuo has a h/o PAD, symptomatic PVCs, CAD, pacemaker, ICM with baseline EF 45% and carotid artery disease. He is on pletal for PAD. In the past several month, he has been having increased leg numbness. A repeat ABI was obtained which showed R ABI 0.86 and L ABI 0.81, result did not change much when compare to the previous ABI. He was referred to neurology to find secondary causes for leg numbness. CT of lumber spine revealed severe L4-5 stenosis. Talking with the patient today, he denies any chest pain or dyspnea, leg numbness continue to occur both with standing or walking.   Can the patient hold pletal for a few days prior to back injection?

## 2020-05-09 ENCOUNTER — Ambulatory Visit
Admission: RE | Admit: 2020-05-09 | Discharge: 2020-05-09 | Disposition: A | Discharge status: Home / Self Care | Payer: Medicare Other | Source: Ambulatory Visit | Attending: Neurology | Admitting: Neurology

## 2020-05-09 DIAGNOSIS — M48062 Spinal stenosis, lumbar region with neurogenic claudication: Secondary | ICD-10-CM

## 2020-05-09 DIAGNOSIS — M48061 Spinal stenosis, lumbar region without neurogenic claudication: Secondary | ICD-10-CM | POA: Diagnosis not present

## 2020-05-09 MED ORDER — IOPAMIDOL (ISOVUE-M 200) INJECTION 41%
1.0000 mL | Freq: Once | INTRAMUSCULAR | Status: AC
  Administered 2020-05-09: 1 mL via EPIDURAL

## 2020-05-09 MED ORDER — METHYLPREDNISOLONE ACETATE 40 MG/ML INJ SUSP (RADIOLOG
120.0000 mg | Freq: Once | INTRAMUSCULAR | Status: AC
  Administered 2020-05-09: 120 mg via EPIDURAL

## 2020-05-09 NOTE — Discharge Instructions (Signed)
Post Procedure Spinal Discharge Instruction Sheet  1. You may resume a regular diet and any medications that you routinely take (including pain medications).  2. No driving day of procedure.  3. Light activity throughout the rest of the day.  Do not do any strenuous work, exercise, bending or lifting.  The day following the procedure, you can resume normal physical activity but you should refrain from exercising or physical therapy for at least three days thereafter.   Common Side Effects:   Headaches- take your usual medications as directed by your physician.  Increase your fluid intake.  Caffeinated beverages may be helpful.  Lie flat in bed until your headache resolves.   Restlessness or inability to sleep- you may have trouble sleeping for the next few days.  Ask your referring physician if you need any medication for sleep.   Facial flushing or redness- should subside within a few days.   Increased pain- a temporary increase in pain a day or two following your procedure is not unusual.  Take your pain medication as prescribed by your referring physician.   Leg cramps  Please contact our office at [phone] for the following symptoms:  Fever greater than 100 degrees.  Headaches unresolved with medication after 2-3 days.  Increased swelling, pain, or redness at injection site.  YOU MAY RESTART PLETAL TOMORROW 05/10/20 AT YOUR NEXT SCHEDULED DOSE.

## 2020-05-21 ENCOUNTER — Ambulatory Visit: Payer: Medicare Other | Attending: Internal Medicine

## 2020-05-21 DIAGNOSIS — Z23 Encounter for immunization: Secondary | ICD-10-CM

## 2020-05-21 NOTE — Progress Notes (Signed)
° °  Covid-19 Vaccination Clinic  Name:  KAITO SCHULENBURG    MRN: [id number] DOB: 07-25-1933  05/21/2020  Mr. Maisano was observed post Covid-19 immunization for 15 minutes without incident. He was provided with Vaccine Information Sheet and instruction to access the V-Safe system.   Mr. Luiz was instructed to call 911 with any severe reactions post vaccine:  Difficulty breathing   Swelling of face and throat   A fast heartbeat   A bad rash all over body   Dizziness and weakness

## 2020-06-06 DIAGNOSIS — C672 Malignant neoplasm of lateral wall of bladder: Secondary | ICD-10-CM | POA: Diagnosis not present

## 2020-06-28 ENCOUNTER — Ambulatory Visit (INDEPENDENT_AMBULATORY_CARE_PROVIDER_SITE_OTHER): Payer: Medicare Other

## 2020-06-28 DIAGNOSIS — I447 Left bundle-branch block, unspecified: Secondary | ICD-10-CM | POA: Diagnosis not present

## 2020-06-28 LAB — CUP PACEART REMOTE DEVICE CHECK
Battery Remaining Longevity: 108 mo
Battery Remaining Percentage: 100 %
Brady Statistic RA Percent Paced: 72 %
Brady Statistic RV Percent Paced: 92 %
Date Time Interrogation Session: 20211122154000
Implantable Lead Implant Date: 20170425
Implantable Lead Implant Date: 20170425
Implantable Lead Location: 753859
Implantable Lead Location: 753860
Implantable Lead Model: 7741
Implantable Lead Model: 7742
Implantable Lead Serial Number: 720495
Implantable Lead Serial Number: 763888
Implantable Pulse Generator Implant Date: 20170425
Lead Channel Impedance Value: 665 Ohm
Lead Channel Impedance Value: 818 Ohm
Lead Channel Pacing Threshold Amplitude: 0.8 V
Lead Channel Pacing Threshold Pulse Width: 0.4 ms
Lead Channel Setting Pacing Amplitude: 1.3 V
Lead Channel Setting Pacing Amplitude: 2.5 V
Lead Channel Setting Pacing Pulse Width: 0.4 ms
Lead Channel Setting Sensing Sensitivity: 2.5 mV
Pulse Gen Serial Number: 718025

## 2020-07-05 NOTE — Progress Notes (Signed)
Remote pacemaker transmission.   

## 2020-07-10 LAB — PACEMAKER DEVICE OBSERVATION

## 2020-07-11 ENCOUNTER — Ambulatory Visit (INDEPENDENT_AMBULATORY_CARE_PROVIDER_SITE_OTHER): Payer: Medicare Other | Admitting: Cardiovascular Disease

## 2020-07-11 ENCOUNTER — Other Ambulatory Visit: Payer: Self-pay

## 2020-07-11 ENCOUNTER — Encounter: Payer: Self-pay | Admitting: Cardiovascular Disease

## 2020-07-11 VITALS — BP 119/58 | HR 70 | Ht 69.5 in | Wt 201.2 lb

## 2020-07-11 DIAGNOSIS — I6521 Occlusion and stenosis of right carotid artery: Secondary | ICD-10-CM

## 2020-07-11 DIAGNOSIS — Z950 Presence of cardiac pacemaker: Secondary | ICD-10-CM

## 2020-07-11 DIAGNOSIS — C8208 Follicular lymphoma grade I, lymph nodes of multiple sites: Secondary | ICD-10-CM | POA: Diagnosis not present

## 2020-07-11 DIAGNOSIS — E785 Hyperlipidemia, unspecified: Secondary | ICD-10-CM | POA: Diagnosis not present

## 2020-07-11 DIAGNOSIS — I739 Peripheral vascular disease, unspecified: Secondary | ICD-10-CM | POA: Diagnosis not present

## 2020-07-11 DIAGNOSIS — I442 Atrioventricular block, complete: Secondary | ICD-10-CM

## 2020-07-11 DIAGNOSIS — I10 Essential (primary) hypertension: Secondary | ICD-10-CM

## 2020-07-11 DIAGNOSIS — I255 Ischemic cardiomyopathy: Secondary | ICD-10-CM

## 2020-07-11 DIAGNOSIS — I472 Ventricular tachycardia: Secondary | ICD-10-CM | POA: Diagnosis not present

## 2020-07-11 DIAGNOSIS — I25718 Atherosclerosis of autologous vein coronary artery bypass graft(s) with other forms of angina pectoris: Secondary | ICD-10-CM

## 2020-07-11 DIAGNOSIS — I4729 Other ventricular tachycardia: Secondary | ICD-10-CM

## 2020-07-11 NOTE — Progress Notes (Signed)
Patient ID: MATIS MONNIER, male   DOB: 04-20-1933, 86 y.o.   MRN: [id number] Patient ID: JENS SIEMS, male   DOB: [birth year], 86 y.o.   MRN: [id number]    Cardiology Office Note    Date:  07/12/2020   ID:  SHERRELL FARISH, DOB 06-Nov-1932, MRN [id number]  PCP:  Mayra Neer, MD  Cardiologist:  Quay Burow, MD (PAD);  Sanda Klein, MD   Chief Complaint  Patient presents with  . Coronary Artery Disease  . Irregular Heart Beat  . Pacemaker Check    History of Present Illness:  YIFAN AUKER is a 86 y.o. male JEYSON DESHOTEL is a 86 y.o. male who presents for follow-up on his pacemaker, pacemaker dependent due to complete heart block. He also has coronary artery disease, PAD of the lower extremities (has occluded popliteal stents in the left lower extremity), symptomatic PVCs (and one episode of lengthy nonsustained VT detected by his pacemaker).  [name] has remained physically active and remains almost completely free of angina pectoris on a combination of long-acting nitrates and beta-blockers.  He very rarely will have a mild sensation of chest discomfort when he really pushes it and he has not taken any nitroglycerin sublingually.  Similarly, he only occasionally has problems with claudication, for example when walking uphill.  He has not had palpitations dizziness or syncope and denies lower extremity edema, orthopnea or PND.  He continues to have pain in his left lower extremity, but this sounds neurogenic.  It happens when he is sitting or standing without moving for long periods of time.  [place]all model number [id number], serial number [id number] Right atrial lead (new) [employer] MRI, model number 2409, serial number [id number] Right ventricular lead (new) [employer] MRI, model number [id number], serial number [id number].  Normal pacemaker function.  He has 72% atrial pacing and 92% ventricular pacing (this is down from 95% ventricular pacing and  reflects increased frequency of PVCs).  The average frequency of PVCs has increased from 150/hour to 250/hour.  On the other hand, he has not had any episodes of sustained or nonsustained ventricular tachycardia.  He has very rare brief episodes of paroxysmal atrial tachycardia lasting for 5-10 beats; there is no evidence of atrial fibrillation..  Estimated generator longevity is 8.5 years.  His most recent lipid profile in November 2020 shows total cholesterol 126, LDL 56, HDL 38.  His creatinine is 1.21 and his liver function tests were normal.  He is due to have repeat labs next month with his primary care provider, Dr. Mayra Neer  Mr. Crabbe has had numerous coronary revascularization procedures, most recently a stent in the saphenous vein graft to the oblique marginal artery in July 2014. He is now almost 20 years status post coronary bypass surgery. The same bypass graft had received a stent in 2007. He has mild ischemic cardiomyopathy with an ejection fraction of 40-45%, without clinical  heart failure. The nuclear stress test on July 28, 2015 shows old scar in the apex and inferior wall. LVEF is calculated at 43% (identical to 2014 and 2015).  He had occlusion of the left popliteal artery with distal reconstitution via collaterals. He had successful recanalization with placement of 2 stents by Dr. Andree Elk in 2015 . This led to marked improvement in his claudication, but the stents subsequently occluded.  Claudication improved with cilostazol. He is pacemaker dependent secondary to complete heart block. He had a generator changeout in 2017.  Interrogation of his device today again shows some issues with "noise" on the atrial channel but none on the ventricular channel.  Moderate carotid artery disease (stable 60-79% proximal internal carotid artery stenosis on the right, mild plaque on the left), last duplex US was February 11, 2015.  He has a history of non-Hodgkin's lymphoma with a large neck mass  but also mediastinal and abdominal lymphadenopathy treated with radiotherapy to the cervical area in 2011 followed by chemotherapy with Rituxan and fludarabine. He is considered to be in remission.  He has undergone cystoscopic resection of a bladder tumor and bilateral cataract surgery.    Past Medical History:  Diagnosis Date  . Arthritis    "hands" (10/19/2013)  . Bilateral carotid bruits 04/08/2020   Right greater than left  . Bladder tumor   . CAD (coronary artery disease)    cardiologist-  dr crouitoru  . Carotid artery disease (HCC)    moderate pRICA 86-76%,  1-95% LICA , >09% RECA last duplex 08-05-2015  . Cataract of both eyes   . Complete heart block ([place])   . GERD (gastroesophageal reflux disease)   . Glaucoma, both eyes   . Hematuria   . History of MI (myocardial infarction)    1989  . Hyperlipidemia   . Hypertension   . Ischemic cardiomyopathy    ef 40-45%  without clinical heart failure per cardiology note 12-28-2015 (nuclear stress ef 43% from 07-28-2015)  . Left ventricular dysfunction   . Non Hodgkin's lymphoma ([place]) dx 06/2010---  oncologist-  dr Benay Spice---  in clinical remission since 2013   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 -- lymphadenopathy to neck, mediastinal , and abdominal--  chemotherapy completed 06/ 2011  . NSVT (nonsustained ventricular tachycardia) [facility]) cardiologist-  dr crouitoru   Max rate 200 bpm, 28-beat, nearly 9-second episode recorded by pacemaker on November 15, asymptomatic   . Pacemaker pacemaker dependant due to CHB   1st Pacemaker Placement 1998 w/ New generator  12-31-2000 ; 08-04-2008;  11-29-2015  . PAD (peripheral artery disease) ([place])    followed by dr berry--- last duplex 08-05-2015  patents bilateral popliteal stents, bilateral SFA 30-49% stenosis  . Peripheral vascular disease with claudication ([place])    dx 2009 left popliteal occlusion with collaterals; 03- 2015  s/p  stenting right popliteal  by dr  berry and left popliteal stenting by dr Andree Elk ([facility]) july 2015  . S/P CABG x 5    09-10-1988  . S/P drug eluting coronary stent placement 01/04/2006   01-04-2006--- DES to SVG to OM1, OM2, and IR, and SVG to PDA(x2 grafts)  and 03-04-2013  DES to SVR to OM  . Wears glasses     Past Surgical History:  Procedure Laterality Date  . ANAL FISSURE REPAIR  12-09-2000 and 1970's  . CARDIAC CATHETERIZATION  04/09/2000   patent grafts  . CARDIAC PACEMAKER PLACEMENT  11-15-1993   dr Cyndia Bent  . CARDIOVASCULAR STRESS TEST  07-28-2015  dr crouitoru   Intermediate risk nuclear study w/ large, severe, predominantly fixed inferior and apical defect; minimal reversibility inferior lateral wall; findings consistent w/ large prior infarct and trivial peri-infarct ischemia;  ef 43% w/ inferior hypokinesis and apical akinesis; mild LVE  . CORONARY ANGIOPLASTY WITH STENT PLACEMENT  01-04-2006  dr gamble   DES to pSVG to OM1, SVG to OM2 &  IR, and mSVG to RCA dPDA (x2 grafts );  pLAD , pLCFX and  mRCA occluded;  LIMA  to LAD mid-distal patent with occlusion beyond LIMA with collateral flow;  ef 30%  . CORONARY ARTERY BYPASS GRAFT  09/10/1988   dr Redmond Pulling   "CABG X5" (03/04/2013) LIMA to LAD,SVG to imtermediate OM1 OM II,SVG to PDA  . CYSTOSCOPY W/ RETROGRADES Bilateral 04/13/2016   Procedure: CYSTOSCOPY WITH RETROGRADE PYELOGRAM;  Surgeon: Alexis Frock, MD;  Location: [facility];  Service: Urology;  Laterality: Bilateral;  . EP IMPLANTABLE DEVICE N/A 11/29/2015   Procedure: Pacemaker Implant;  Surgeon: Sanda Klein, MD;  Location: [place] [facility];  Service: Cardiovascular;  Laterality: N/A;  new generator and new atrial, vertricular leads;  Patent attorney  . LEFT HEART CATHETERIZATION WITH CORONARY/GRAFT ANGIOGRAM N/A 03/04/2013   Procedure: LEFT HEART CATHETERIZATION WITH Beatrix Fetters;  Surgeon: Troy Sine, MD;  Location: [facility] CATH LAB;  Service: Cardiovascular;   Laterality: N/A;  . LOWER EXTREMITY ANGIOGRAM N/A 08/31/2013   Procedure: LOWER EXTREMITY ANGIOGRAM;  Surgeon: Lorretta Harp, MD;  Location: [facility] CATH LAB;  Service: Cardiovascular;  Laterality: N/A;  . PACEMAKER REPLACEMENT  12/31/2000;  08-04-2008  . PERCUTANEOUS CORONARY STENT INTERVENTION (PCI-S)  03/04/2013   Procedure: PERCUTANEOUS CORONARY STENT INTERVENTION (PCI-S);  Surgeon: Troy Sine, MD;  Location: [facility] CATH LAB;  Service: Cardiovascular;;  DES to S-OM for in-stent restenosis  . POPLITEAL ARTERY STENT Right 10-21-2013  dr berry   atherectomy, PTA , and IDEV stent x1  . POPLITEAL ARTERY STENT Left 07/ 2015  dr Andree Elk at [facility]   angioplasty / stent x2  . 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 SUPERFICIAL LYMPH NODE BIOPSY / EXCISION Left 2011   cervical lymph node  . TRANSURETHRAL RESECTION OF BLADDER TUMOR N/A 04/13/2016   Procedure: TRANSURETHRAL RESECTION OF BLADDER TUMOR (TURBT) with clot evacuation;  Surgeon: Alexis Frock, MD;  Location: [facility];  Service: Urology;  Laterality: N/A;  . VENOGRAM Left 11/29/2015   Procedure: Venogram;  Surgeon: Sanda Klein, MD;  Location: [place] [facility];  Service: Cardiovascular;  Laterality: Left;    Outpatient Medications Prior to Visit  Medication Sig Dispense Refill  . aspirin EC 81 MG tablet Take 81 mg by mouth daily.    . cetirizine (ZYRTEC) 10 MG tablet Take 10 mg by mouth every evening.     . cilostazol (PLETAL) 100 MG tablet TAKE 1 TABLET BY MOUTH  TWICE DAILY 180 tablet 3  . icosapent Ethyl (VASCEPA) 1 g capsule Take 2 capsules (2 g total) by mouth 2 (two) times daily. 360 capsule 3  . isosorbide mononitrate (IMDUR) 30 MG 24 hr tablet Take 1 tablet (30 mg total) by mouth in the morning and at bedtime. 180 tablet 3  . lisinopril (PRINIVIL,ZESTRIL) 20 MG tablet Take 20 mg by mouth every morning.     . metoprolol tartrate (LOPRESSOR) 50 MG tablet TAKE 1 AND 1/2 TABLETS BY  MOUTH  TWICE DAILY 270 tablet 3  . Multiple Vitamins-Minerals (SENIOR MULTIVITAMIN PLUS PO) Take 1 tablet by mouth daily.    . pantoprazole (PROTONIX) 40 MG tablet TAKE 1 TABLET BY MOUTH  DAILY 90 tablet 3  . rosuvastatin (CRESTOR) 40 MG tablet TAKE 1 TABLET BY MOUTH AT  BEDTIME 90 tablet 3  . OVER THE COUNTER MEDICATION Take 1 tablet by mouth daily. [facility]     No facility-administered medications prior to visit.     Allergies:   Azithromycin, Lactose intolerance (gi), Niacin and related, and Sulfa  antibiotics   Social History   Socioeconomic History  . Marital status: Married    Spouse name: Not on file  . Number of children: Not on file  . Years of education: Not on file  . Highest education level: Not on file  Occupational History  . Not on file  Tobacco Use  . Smoking status: Former Smoker    Packs/day: 0.12    Years: 1.00    Pack years: 0.12    Types: Cigarettes    Quit date: 08/06/1956    Years since quitting: 63.9  . Smokeless tobacco: Never Used  Substance and Sexual Activity  . Alcohol use: No  . Drug use: No  . Sexual activity: Never  Other Topics Concern  . Not on file  Social History Narrative  . Not on file   Social Determinants of Health   Financial Resource Strain:   . Difficulty of Paying Living Expenses: Not on file  Food Insecurity:   . Worried About Charity fundraiser in the Last Year: Not on file  . Ran Out of Food in the Last Year: Not on file  Transportation Needs:   . Lack of Transportation (Medical): Not on file  . Lack of Transportation (Non-Medical): Not on file  Physical Activity:   . Days of Exercise per Week: Not on file  . Minutes of Exercise per Session: Not on file  Stress:   . Feeling of Stress : Not on file  Social Connections:   . Frequency of Communication with Friends and Family: Not on file  . Frequency of Social Gatherings with Friends and Family: Not on file  . Attends Religious Services: Not on file  .  Active Member of Clubs or Organizations: Not on file  . Attends Archivist Meetings: Not on file  . Marital Status: Not on file     Family History:  The patient's family history includes Diabetes in his mother; Heart attack in his father; Hyperlipidemia in his sister and sister; Hypertension in his sister and sister; Stroke in his mother.   ROS:   Please see the history of present illness.    ROS All other systems are reviewed and are negative.  PHYSICAL EXAM:   VS:  BP (!) 119/58   Pulse 70   Ht 5' 9.5" (1.765 m)   Wt 201 lb 3.2 oz (91.3 kg)   SpO2 97%   BMI 29.29 kg/m      General: Alert, oriented x3, no distress, overweight.  Healthy left subclavian pacemaker site. Head: no evidence of trauma, PERRL, EOMI, no exophtalmos or lid lag, no myxedema, no xanthelasma; normal ears, nose and oropharynx Neck: normal jugular venous pulsations and no hepatojugular reflux; brisk carotid pulses without delay and no carotid bruits Chest: clear to auscultation, no signs of consolidation by percussion or palpation, normal fremitus, symmetrical and full respiratory excursions Cardiovascular: normal position and quality of the apical impulse, regular rhythm, normal first and paradoxically split second heart sounds, no murmurs, rubs or gallops Abdomen: no tenderness or distention, no masses by palpation, no abnormal pulsatility or arterial bruits, normal bowel sounds, no hepatosplenomegaly Extremities: no clubbing, cyanosis or edema; 2+ radial, ulnar and brachial pulses bilaterally; 2+ right femoral, posterior tibial and dorsalis pedis pulses; 2+ left femoral, 1+ left posterior tibial and dorsalis pedis pulses; no subclavian or femoral bruits Neurological: grossly nonfocal Psych: Normal mood and affect    Wt Readings from Last 3 Encounters:  07/11/20 201 lb 3.2 oz (  91.3 kg)  04/08/20 203 lb (92.1 kg)  02/15/20 202 lb 9.6 oz (91.9 kg)    Studies/Labs Reviewed:   EKG:  EKG is ordered  today.  It shows AV sequential pacing with frequent PVCs (3 recorded in the 10-second rhythm strip). Recent Labs: 02/15/2020: Hemoglobin 12.7; Platelet Count 152   Lipid Panel  November 2020 shows total cholesterol 126, LDL 56, HDL 38.  His creatinine is 1.21 and his liver function tests were normal.     Component Value Date/Time   CHOL 119 06/24/2014 0855   TRIG 185 (H) 06/24/2014 0855   HDL 35 (L) 06/24/2014 0855   CHOLHDL 3.4 06/24/2014 0855   VLDL 37 06/24/2014 0855   LDLCALC 47 06/24/2014 0855   ASSESSMENT:    1. Coronary artery disease of autologous vein bypass graft with stable angina pectoris ([place])   2. CHB (complete heart block) (HCC)   3. PAD (peripheral artery disease) ([place])   4. Pacemaker   5. Cardiomyopathy, ischemic- EF 40-45% by echo 4/14   6. NSVT (nonsustained ventricular tachycardia) (HCC)   7. Essential hypertension   8. Asymptomatic stenosis of right carotid artery without infarction   9. Dyslipidemia   10. Grade 1 follicular lymphoma of lymph nodes of multiple regions [facility])      PLAN:  In order of problems listed above:  1. CAD s/p CABG 1995, s/p stent SVG-OM 2007 and July 2014: Has very mild stable angina pectoris, CCS functional class I-2 on combination of beta-blockers and low-dose of long-acting nitrates.  Remains physically active.  He had a low risk nuclear study in December 2016.  2. CHB: He is pacemaker dependent. 3. PAD: His lower extremity complaints sound neurogenic, rather than vasculogenic.  Lower extremity arterial Dopplers performed in July 2021 showed ABI of 0.86 on the right and 0.81 on the left, essentially unchanged from the year before. 4. Pacemaker: He had new leads implanted in the new generator in 2017, but the old unipolar leads are still in place and he cannot have an MRI scan, unfortunately.  Normal device function.  Continue remote downloads every 3 months. 5. Cardiomyopathy, ischemic - EF 43%: Clinically euvolemic with excellent  functional status even though he is not taking diuretics. 6.  PVCs/NSVT: No recent nonsustained VT.  His burden of PVCs has increased, to 8% of all beats, roughly 250 PVCs per hour.  They are completely asymptomatic.  He is on beta-blockers.  We will have labs checked in the next couple of weeks, but I do not anticipate we will find major electrolyte abnormalities since he does not take diuretics.  Continue to observe for now. 7. HTN: Excellent control. 8. Asymptomatic stenosis of right carotid artery without infarction: stable moderate stenosis by repeat carotid duplex ultrasound in February 2021.  No neurological complaints to suggest ischemic events. 9. HLP:   Excellent LDL level, HDL remains borderline low.  To have labs again within the month with Dr. Brigitte Pulse. 10. Lymphoma in remission  Medication Adjustments/Labs and Tests Ordered: Current medicines are reviewed at length with the patient today.  Concerns regarding medicines are outlined above.  Medication changes, Labs and Tests ordered today are listed in the Patient Instructions below. Patient Instructions  Medication Instructions:  No changes *If you need a refill on your cardiac medications before your next appointment, please call your pharmacy*   Lab Work: None ordered If you have labs (blood work) drawn today and your tests are completely normal, you will receive your results only  by: . MyChart Message (if you have MyChart) OR . A paper copy in the mail If you have any lab test that is abnormal or we need to change your treatment, we will call you to review the results.   Testing/Procedures: None ordered   Follow-Up: At [facility], you and your health needs are our priority.  As part of our continuing mission to provide you with exceptional heart care, we have created designated Provider Care Teams.  These Care Teams include your primary Cardiologist (physician) and Advanced Practice Providers (APPs -  Physician Assistants  and Nurse Practitioners) who all work together to provide you with the care you need, when you need it.  We recommend signing up for the patient portal called "MyChart".  Sign up information is provided on this After Visit Summary.  MyChart is used to connect with patients for Virtual Visits (Telemedicine).  Patients are able to view lab/test results, encounter notes, upcoming appointments, etc.  Non-urgent messages can be sent to your provider as well.   To learn more about what you can do with MyChart, go to [email or web address].    Your next appointment:   6 month(s)  The format for your next appointment:   In Person  Provider:   Sanda Klein, MD       Signed, Sanda Klein, MD  07/12/2020 5:43 PM    [address], [address], [place]  [address] Phone: [phone]; Fax: [phone]

## 2020-07-11 NOTE — Patient Instructions (Signed)

## 2020-07-12 ENCOUNTER — Encounter: Payer: Self-pay | Admitting: Cardiovascular Disease

## 2020-07-15 DIAGNOSIS — J301 Allergic rhinitis due to pollen: Secondary | ICD-10-CM | POA: Diagnosis not present

## 2020-07-15 DIAGNOSIS — E782 Mixed hyperlipidemia: Secondary | ICD-10-CM | POA: Diagnosis not present

## 2020-07-15 DIAGNOSIS — I2510 Atherosclerotic heart disease of native coronary artery without angina pectoris: Secondary | ICD-10-CM | POA: Diagnosis not present

## 2020-07-15 DIAGNOSIS — I739 Peripheral vascular disease, unspecified: Secondary | ICD-10-CM | POA: Diagnosis not present

## 2020-07-15 DIAGNOSIS — L309 Dermatitis, unspecified: Secondary | ICD-10-CM | POA: Diagnosis not present

## 2020-07-15 DIAGNOSIS — K219 Gastro-esophageal reflux disease without esophagitis: Secondary | ICD-10-CM | POA: Diagnosis not present

## 2020-07-15 DIAGNOSIS — M4800 Spinal stenosis, site unspecified: Secondary | ICD-10-CM | POA: Diagnosis not present

## 2020-07-15 DIAGNOSIS — C8590 Non-Hodgkin lymphoma, unspecified, unspecified site: Secondary | ICD-10-CM | POA: Diagnosis not present

## 2020-07-15 DIAGNOSIS — I119 Hypertensive heart disease without heart failure: Secondary | ICD-10-CM | POA: Diagnosis not present

## 2020-07-15 DIAGNOSIS — Z8551 Personal history of malignant neoplasm of bladder: Secondary | ICD-10-CM | POA: Diagnosis not present

## 2020-07-15 DIAGNOSIS — Z0000 Encounter for general adult medical examination without abnormal findings: Secondary | ICD-10-CM | POA: Diagnosis not present

## 2020-07-15 DIAGNOSIS — Z7189 Other specified counseling: Secondary | ICD-10-CM | POA: Diagnosis not present

## 2020-09-21 ENCOUNTER — Other Ambulatory Visit: Payer: Self-pay | Admitting: Cardiovascular Disease

## 2020-09-21 ENCOUNTER — Other Ambulatory Visit: Payer: Self-pay

## 2020-09-21 ENCOUNTER — Ambulatory Visit (HOSPITAL_COMMUNITY)
Admission: RE | Admit: 2020-09-21 | Discharge: 2020-09-21 | Disposition: A | Discharge status: Home / Self Care | Payer: Medicare Other | Source: Ambulatory Visit | Attending: Cardiovascular Disease | Admitting: Cardiovascular Disease

## 2020-09-21 ENCOUNTER — Encounter (HOSPITAL_COMMUNITY): Payer: Medicare Other

## 2020-09-21 DIAGNOSIS — I6529 Occlusion and stenosis of unspecified carotid artery: Secondary | ICD-10-CM | POA: Insufficient documentation

## 2020-09-21 DIAGNOSIS — I442 Atrioventricular block, complete: Secondary | ICD-10-CM | POA: Diagnosis not present

## 2020-09-21 DIAGNOSIS — I739 Peripheral vascular disease, unspecified: Secondary | ICD-10-CM | POA: Diagnosis not present

## 2020-09-27 ENCOUNTER — Ambulatory Visit (INDEPENDENT_AMBULATORY_CARE_PROVIDER_SITE_OTHER): Payer: Medicare Other

## 2020-09-27 DIAGNOSIS — I442 Atrioventricular block, complete: Secondary | ICD-10-CM | POA: Diagnosis not present

## 2020-09-30 LAB — CUP PACEART REMOTE DEVICE CHECK
Battery Remaining Longevity: 102 mo
Battery Remaining Percentage: 97 %
Brady Statistic RA Percent Paced: 69 %
Brady Statistic RV Percent Paced: 96 %
Date Time Interrogation Session: 20220224200300
Implantable Lead Implant Date: 20170425
Implantable Lead Implant Date: 20170425
Implantable Lead Location: 753859
Implantable Lead Location: 753860
Implantable Lead Model: 7741
Implantable Lead Model: 7742
Implantable Lead Serial Number: 720495
Implantable Lead Serial Number: 763888
Implantable Pulse Generator Implant Date: 20170425
Lead Channel Impedance Value: 653 Ohm
Lead Channel Impedance Value: 815 Ohm
Lead Channel Pacing Threshold Amplitude: 0.7 V
Lead Channel Pacing Threshold Pulse Width: 0.4 ms
Lead Channel Setting Pacing Amplitude: 1.2 V
Lead Channel Setting Pacing Amplitude: 2.5 V
Lead Channel Setting Pacing Pulse Width: 0.4 ms
Lead Channel Setting Sensing Sensitivity: 2.5 mV
Pulse Gen Serial Number: 718025

## 2020-10-05 NOTE — Progress Notes (Signed)
Remote pacemaker transmission.   

## 2020-10-12 DIAGNOSIS — R42 Dizziness and giddiness: Secondary | ICD-10-CM | POA: Diagnosis not present

## 2020-10-25 ENCOUNTER — Other Ambulatory Visit: Payer: Self-pay

## 2020-10-25 ENCOUNTER — Ambulatory Visit (INDEPENDENT_AMBULATORY_CARE_PROVIDER_SITE_OTHER): Payer: Medicare Other | Admitting: Cardiovascular Disease

## 2020-10-25 ENCOUNTER — Encounter: Payer: Self-pay | Admitting: Cardiovascular Disease

## 2020-10-25 VITALS — BP 132/64 | HR 64 | Ht 69.5 in | Wt 200.0 lb

## 2020-10-25 DIAGNOSIS — I10 Essential (primary) hypertension: Secondary | ICD-10-CM

## 2020-10-25 DIAGNOSIS — E785 Hyperlipidemia, unspecified: Secondary | ICD-10-CM

## 2020-10-25 DIAGNOSIS — I442 Atrioventricular block, complete: Secondary | ICD-10-CM | POA: Diagnosis not present

## 2020-10-25 DIAGNOSIS — I25718 Atherosclerosis of autologous vein coronary artery bypass graft(s) with other forms of angina pectoris: Secondary | ICD-10-CM

## 2020-10-25 DIAGNOSIS — I739 Peripheral vascular disease, unspecified: Secondary | ICD-10-CM | POA: Diagnosis not present

## 2020-10-25 DIAGNOSIS — I6521 Occlusion and stenosis of right carotid artery: Secondary | ICD-10-CM

## 2020-10-25 DIAGNOSIS — I255 Ischemic cardiomyopathy: Secondary | ICD-10-CM | POA: Diagnosis not present

## 2020-10-25 NOTE — Patient Instructions (Signed)

## 2020-10-25 NOTE — Assessment & Plan Note (Signed)
History of dyslipidemia on statin therapy lipid profile performed 07/15/2020 revealing total cholesterol 132, LDL 67 and HDL 41.

## 2020-10-25 NOTE — Assessment & Plan Note (Signed)
History of carotid artery disease with Doppler performed 09/21/2020 revealing moderate right ICA stenosis.  This will be repeated on an annual basis.

## 2020-10-25 NOTE — Assessment & Plan Note (Signed)
History of complete heart block with pacemaker placed in 1995 with multiple generator change since that time followed by Dr. [name].  He was recently seen by Dr. [name] on 07/11/2020.  He has 8-1/2 years of battery life remaining.

## 2020-10-25 NOTE — Assessment & Plan Note (Addendum)
History of peripheral arterial disease status post orbital atherectomy of a highly calcified right popliteal artery stenosis by myself 08/31/2013.  I attempted to open a left popliteal CTO unsuccessfully and ultimately referred him to Dr. Brunetta Jeans who was able to get across this in place and I DEV stent.  This is subsequently been shown to have occluded.  He denies claudication.  Recent Doppler studies performed 02/05/2020 revealed a right ABI of 0.86 and a left of 0.81.  His right popliteal stent appears patent and his left one is occluded.

## 2020-10-25 NOTE — Assessment & Plan Note (Signed)
She of ischemic cardiomyopathy with an EF of in the 40 to 45% range by 2D echo back in 2014.  He is on metoprolol and Prinivil has no symptoms of heart failure.

## 2020-10-25 NOTE — Assessment & Plan Note (Signed)
History of essential hypertension blood pressure measured today 132/64.  He is on lisinopril metoprolol.

## 2020-10-25 NOTE — Progress Notes (Signed)
10/25/2020 David [name]   20-Oct-1932  [id number]  Primary Physician Mayra Neer, MD Primary Cardiologist: Lorretta Harp MD [name], [place]  HPI:  David [name] is a 87 y.o.  married Caucasian male father of 3 children is retired Tree surgeon for a company. He was initially patient of Dr. Rush Landmark Gamble's followed by Dr. Terance Ice and now Dr. [name]. I last saw him in the office  09/22/2019. He has a history of peripheral arterial disease with known moderate right internal carotid artery stenosis but with ultrasound as well as an occluded left popliteal artery by angiography back in 2009. He has geniculate collaterals with three-vessel runoff and lifestyle limiting claudication. His other problems include a history of hypertension and hyperlipidemia. He had coronary bypass grafting 09/10/88 and has had 3 stent procedures since most recently by Dr. Ellouise Newer on 03/04/13 which time he placed a drug-eluting stent in the obtuse marginal vein graft. He denies chest pain or shortness of breath. He is currently displaying chronic rehabilitation. Because of ongoing claudication left greater than right as well as advances in technology it was decided to attempt percutaneous revascularization of the left popliteal chronic total occlusion.I attempted this on 08/31/2013 unsuccessfully. I then performed staged right SFA diamondback orbital rotational atherectomy, PT and stenting using an IDEV stent with excellent angiographic result. Followup Dopplers performed on 11/13/13 revealed an ABI 1.1 on the right. The procedure was complicated by a left common femoral pseudoaneurysm which was compressed and ultimately closed confirmed by ultrasound. I ultimately referred him to Dr. Brunetta Jeans at [facility] he was able to cross his left popliteal artery CTO successfully. Since that time he has had no claudication. He does have moderate right internal carotid artery stenosis was neurologically  asymptomatic. Dr. [name] follows him for his pacemaker as well.  Since I saw him 1 year ago he has remained stable.  He denies chest pain, shortness of breath or claudication.  Recent carotid Doppler showed moderately severe right ICA stenosis and lower extremity Dopplers revealed a widely patent right SFA stent with an occluded left popliteal stent.   Current Meds  Medication Sig  . aspirin EC 81 MG tablet Take 81 mg by mouth daily.  . cetirizine (ZYRTEC) 10 MG tablet Take 10 mg by mouth every evening.   . cilostazol (PLETAL) 100 MG tablet TAKE 1 TABLET BY MOUTH  TWICE DAILY  . icosapent Ethyl (VASCEPA) 1 g capsule Take 2 capsules (2 g total) by mouth 2 (two) times daily.  . isosorbide mononitrate (IMDUR) 30 MG 24 hr tablet Take 1 tablet (30 mg total) by mouth in the morning and at bedtime.  Marland Kitchen lisinopril (PRINIVIL,ZESTRIL) 20 MG tablet Take 20 mg by mouth every morning.   . metoprolol tartrate (LOPRESSOR) 50 MG tablet TAKE 1 AND 1/2 TABLETS BY  MOUTH TWICE DAILY  . Multiple Vitamins-Minerals (SENIOR MULTIVITAMIN PLUS PO) Take 1 tablet by mouth daily.  . rosuvastatin (CRESTOR) 40 MG tablet TAKE 1 TABLET BY MOUTH AT  BEDTIME     Allergies  Allergen Reactions  . Azithromycin Diarrhea  . Lactose Intolerance (Gi) Diarrhea  . Niacin And Related Diarrhea  . Sulfa Antibiotics Rash    Social History   Socioeconomic History  . Marital status: Married    Spouse name: Not on file  . Number of children: Not on file  . Years of education: Not on file  . Highest education level: Not on file  Occupational  History  . Not on file  Tobacco Use  . Smoking status: Former Smoker    Packs/day: 0.12    Years: 1.00    Pack years: 0.12    Types: Cigarettes    Quit date: 08/06/1956    Years since quitting: 64.2  . Smokeless tobacco: Never Used  Substance and Sexual Activity  . Alcohol use: No  . Drug use: No  . Sexual activity: Never  Other Topics Concern  . Not on file  Social History  Narrative  . Not on file   Social Determinants of Health   Financial Resource Strain: Not on file  Food Insecurity: Not on file  Transportation Needs: Not on file  Physical Activity: Not on file  Stress: Not on file  Social Connections: Not on file  Intimate Partner Violence: Not on file     Review of Systems: General: negative for chills, fever, night sweats or weight changes.  Cardiovascular: negative for chest pain, dyspnea on exertion, edema, orthopnea, palpitations, paroxysmal nocturnal dyspnea or shortness of breath Dermatological: negative for rash Respiratory: negative for cough or wheezing Urologic: negative for hematuria Abdominal: negative for nausea, vomiting, diarrhea, bright red blood per rectum, melena, or hematemesis Neurologic: negative for visual changes, syncope, or dizziness All other systems reviewed and are otherwise negative except as noted above.    Blood pressure 132/64, pulse 64, height 5' 9.5" (1.765 m), weight 200 lb (90.7 kg).  General appearance: alert and no distress Neck: no adenopathy, no carotid bruit, no JVD, supple, symmetrical, trachea midline and thyroid not enlarged, symmetric, no tenderness/mass/nodules Lungs: clear to auscultation bilaterally Heart: regular rate and rhythm, S1, S2 normal, no murmur, click, rub or gallop Extremities: extremities normal, atraumatic, no cyanosis or edema Pulses: 2+ and symmetric Skin: Skin color, texture, turgor normal. No rashes or lesions Neurologic: Alert and oriented X 3, normal strength and tone. Normal symmetric reflexes. Normal coordination and gait  EKG AV sequentially paced rhythm at 64.  I personally reviewed this EKG.  ASSESSMENT AND PLAN:   Complete heart block- [facility] pacemaker '95 with Gen change '02, '09. New leads 2017. Pacer dependednt History of complete heart block with pacemaker placed in 1995 with multiple generator change since that time followed by Dr. [name].  He was recently seen  by Dr. [name] on 07/11/2020.  He has 8-1/2 years of battery life remaining.  PVD - Lt popliteal occlusion with collaterals '09; rt popliteal successful diamondback orbital rotational atherectomy PTA and stent using an IDEV stent 10/21/13 History of peripheral arterial disease status post orbital atherectomy of a highly calcified right popliteal artery stenosis by myself 08/31/2013.  I attempted to open a left popliteal CTO unsuccessfully and ultimately referred him to Dr. Brunetta Jeans who was able to get across this in place and I DEV stent.  This is subsequently been shown to have occluded.  He denies claudication.  Recent Doppler studies performed 02/05/2020 revealed a right ABI of 0.86 and a left of 0.81.  His right popliteal stent appears patent and his left one is occluded.  CAD s/p CABG 1995, s/p stent SVG-OM 2007 and July 2014 History of CAD status post coronary artery bypass grafting 09/10/1988.  He had 3 stents stent since that time most recently by Dr. Ellouise Newer 03/04/2013 with a drug-eluting stent placed to an obtuse marginal branch vein graft.  He has had no further intervention since that time.  He denies chest pain or shortness of breath.  Dyslipidemia History of dyslipidemia on  statin therapy lipid profile performed 07/15/2020 revealing total cholesterol 132, LDL 67 and HDL 41.  HTN (hypertension) History of essential hypertension blood pressure measured today 132/64.  He is on lisinopril metoprolol.  Asymptomatic stenosis of right carotid artery without infarction History of carotid artery disease with Doppler performed 09/21/2020 revealing moderate right ICA stenosis.  This will be repeated on an annual basis.  Cardiomyopathy, ischemic- EF 40-45% by echo 4/14 She of ischemic cardiomyopathy with an EF of in the 40 to 45% range by 2D echo back in 2014.  He is on metoprolol and Prinivil has no symptoms of heart failure.      Lorretta Harp MD FACP,FACC,FAHA, [facility] 10/25/2020 4:12  PM

## 2020-10-25 NOTE — Assessment & Plan Note (Signed)
History of CAD status post coronary artery bypass grafting 09/10/1988.  He had 3 stents stent since that time most recently by Dr. Ellouise Newer 03/04/2013 with a drug-eluting stent placed to an obtuse marginal branch vein graft.  He has had no further intervention since that time.  He denies chest pain or shortness of breath.

## 2020-12-29 ENCOUNTER — Other Ambulatory Visit: Payer: Self-pay | Admitting: Cardiovascular Disease

## 2021-01-03 ENCOUNTER — Ambulatory Visit (INDEPENDENT_AMBULATORY_CARE_PROVIDER_SITE_OTHER): Payer: Medicare Other

## 2021-01-03 DIAGNOSIS — I442 Atrioventricular block, complete: Secondary | ICD-10-CM

## 2021-01-03 LAB — CUP PACEART REMOTE DEVICE CHECK
Battery Remaining Longevity: 96 mo
Battery Remaining Percentage: 94 %
Brady Statistic RA Percent Paced: 69 %
Brady Statistic RV Percent Paced: 96 %
Date Time Interrogation Session: 20220531022200
Implantable Lead Implant Date: 20170425
Implantable Lead Implant Date: 20170425
Implantable Lead Location: 753859
Implantable Lead Location: 753860
Implantable Lead Model: 7741
Implantable Lead Model: 7742
Implantable Lead Serial Number: 720495
Implantable Lead Serial Number: 763888
Implantable Pulse Generator Implant Date: 20170425
Lead Channel Impedance Value: 711 Ohm
Lead Channel Impedance Value: 805 Ohm
Lead Channel Pacing Threshold Amplitude: 0.7 V
Lead Channel Pacing Threshold Pulse Width: 0.4 ms
Lead Channel Setting Pacing Amplitude: 1.2 V
Lead Channel Setting Pacing Amplitude: 2.5 V
Lead Channel Setting Pacing Pulse Width: 0.4 ms
Lead Channel Setting Sensing Sensitivity: 2.5 mV
Pulse Gen Serial Number: 718025

## 2021-01-09 ENCOUNTER — Ambulatory Visit (INDEPENDENT_AMBULATORY_CARE_PROVIDER_SITE_OTHER): Payer: Medicare Other | Admitting: Cardiovascular Disease

## 2021-01-09 ENCOUNTER — Encounter: Payer: Self-pay | Admitting: Cardiovascular Disease

## 2021-01-09 ENCOUNTER — Other Ambulatory Visit: Payer: Self-pay

## 2021-01-09 VITALS — BP 110/78 | HR 62 | Resp 20 | Ht 69.0 in | Wt 201.2 lb

## 2021-01-09 DIAGNOSIS — Z8579 Personal history of other malignant neoplasms of lymphoid, hematopoietic and related tissues: Secondary | ICD-10-CM

## 2021-01-09 DIAGNOSIS — I442 Atrioventricular block, complete: Secondary | ICD-10-CM | POA: Diagnosis not present

## 2021-01-09 DIAGNOSIS — I4729 Other ventricular tachycardia: Secondary | ICD-10-CM

## 2021-01-09 DIAGNOSIS — E785 Hyperlipidemia, unspecified: Secondary | ICD-10-CM | POA: Diagnosis not present

## 2021-01-09 DIAGNOSIS — I739 Peripheral vascular disease, unspecified: Secondary | ICD-10-CM | POA: Diagnosis not present

## 2021-01-09 DIAGNOSIS — I6521 Occlusion and stenosis of right carotid artery: Secondary | ICD-10-CM

## 2021-01-09 DIAGNOSIS — I472 Ventricular tachycardia: Secondary | ICD-10-CM

## 2021-01-09 DIAGNOSIS — Z950 Presence of cardiac pacemaker: Secondary | ICD-10-CM

## 2021-01-09 DIAGNOSIS — I10 Essential (primary) hypertension: Secondary | ICD-10-CM | POA: Diagnosis not present

## 2021-01-09 DIAGNOSIS — I25718 Atherosclerosis of autologous vein coronary artery bypass graft(s) with other forms of angina pectoris: Secondary | ICD-10-CM

## 2021-01-09 DIAGNOSIS — Z8572 Personal history of non-Hodgkin lymphomas: Secondary | ICD-10-CM

## 2021-01-09 DIAGNOSIS — I255 Ischemic cardiomyopathy: Secondary | ICD-10-CM

## 2021-01-09 MED ORDER — ISOSORBIDE MONONITRATE ER 30 MG PO TB24: ORAL_TABLET | ORAL | 5 refills | Status: DC

## 2021-01-09 MED ORDER — LISINOPRIL 10 MG PO TABS: 10.0000 mg | ORAL_TABLET | Freq: Every day | ORAL | 1 refills | Status: DC

## 2021-01-09 MED ORDER — AMLODIPINE BESYLATE 5 MG PO TABS: 5.0000 mg | ORAL_TABLET | Freq: Every day | ORAL | 1 refills | Status: DC

## 2021-01-09 NOTE — Progress Notes (Signed)
Patient ID: David [name], male   DOB: 1932-12-08, 87 y.o.   MRN: [id number] Patient ID: David [name], male   DOB: January 31, 1933, 87 y.o.   MRN: [id number]    Cardiology Office Note    Date:  01/10/2021   ID:  David [name], DOB [birth year], MRN [id number]  PCP:  Mayra Neer, MD  Cardiologist:  Quay Burow, MD (PAD);  Sanda Klein, MD   No chief complaint on file.   History of Present Illness:  David [name] is a 87 y.o. male David [name] is a 87 y.o. male who presents for follow-up on his pacemaker, pacemaker dependent due to complete heart block. He also has coronary artery disease, PAD of the lower extremities (has occluded popliteal stents in the left lower extremity), symptomatic PVCs (and one episode of lengthy nonsustained VT detected by his pacemaker).  For the most part David [name] is doing well since his last appointment and he remains physically active.  However he has noticed that angina now occurs at progressively lower threshold of exertion.  Currently still CCS functional class II.  The symptoms resolved within a minute of 2 of rest.  He has not had to take any sublingual nitroglycerin except once (and he is pretty sure that the tablet he took was expired since it did not do anything).  The symptoms most commonly occur in the evening, especially if he is already had dinner and goes out to do some yard work.  He has not had recent problems with claudication, but has bilateral leg numbness and pain at rest suggestive of neuropathy related to his back problems worse in his left foot compared to the right.  He denies dizziness, palpitations, syncope, lower extremity edema, orthopnea or PND  [employer] Accolade EL DR model number I9618080, serial number [id number] Right atrial lead (new) [employer] MRI, model number 0865, serial number [id number] Right ventricular lead (new) [employer] MRI, model number Z1154799, serial number [id number]. Old unipolar leads are still  in place so that he is not MRI conditional despite the fact that the individual components of his new system are MRI conditional.  Device check today shows normal device function.  He has 69% atrial pacing (with good heart rate histogram distribution) and 96% ventricular pacing.  He has about 4% PVCs.  He has not had any ventricular tachycardia.  He has rare episodes of brief atrial tachycardia, which is not tracked and is asymptomatic.  He has not had true atrial fibrillation.  Estimated generator longevity is around 8 years.  His most recent lipid profile in December 2021 shows total cholesterol 132, LDL 67, HDL 41.  His creatinine is 1.38 and his liver function tests were normal.    David [name] has had numerous coronary revascularization procedures, most recently a stent in the saphenous vein graft to the oblique marginal artery in July 2014. He is now almost 20 years status post coronary bypass surgery. The same bypass graft had received a stent in 2007. He has mild ischemic cardiomyopathy with an ejection fraction of 40-45%, without clinical  heart failure. The nuclear stress test on July 28, 2015 shows old scar in the apex and inferior wall. LVEF is calculated at 43% (identical to 2014 and 2015).  He had occlusion of the left popliteal artery with distal reconstitution via collaterals. He had successful recanalization with placement of 2 stents by Dr. Andree Elk in 2015 . This led to marked improvement in his claudication, but the  stents subsequently occluded.  Claudication improved with cilostazol. He is pacemaker dependent secondary to complete heart block. He had a generator changeout in 2017. Interrogation of his device today again shows some issues with "noise" on the atrial channel but none on the ventricular channel.  Moderate carotid artery disease (stable 60-79% proximal internal carotid artery stenosis on the right, mild plaque on the left), last duplex US was February 11, 2015.  He has a history  of non-Hodgkin's lymphoma with a large neck mass but also mediastinal and abdominal lymphadenopathy treated with radiotherapy to the cervical area in 2011 followed by chemotherapy with Rituxan and fludarabine. He is considered to be in remission.  He has undergone cystoscopic resection of a bladder tumor and bilateral cataract surgery.    Past Medical History:  Diagnosis Date  . Arthritis    "hands" (10/19/2013)  . Bilateral carotid bruits 04/08/2020   Right greater than left  . Bladder tumor   . CAD (coronary artery disease)    cardiologist-  dr crouitoru  . Carotid artery disease (HCC)    moderate pRICA 41-96%,  2-22% LICA , >97% RECA last duplex 08-05-2015  . Cataract of both eyes   . Complete heart block ([place])   . GERD (gastroesophageal reflux disease)   . Glaucoma, both eyes   . Hematuria   . History of MI (myocardial infarction)    1989  . Hyperlipidemia   . Hypertension   . Ischemic cardiomyopathy    ef 40-45%  without clinical heart failure per cardiology note 12-28-2015 (nuclear stress ef 43% from 07-28-2015)  . Left ventricular dysfunction   . Non Hodgkin's lymphoma ([place]) dx 06/2010---  oncologist-  dr Benay Spice---  in clinical remission since 2013   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 -- lymphadenopathy to neck, mediastinal , and abdominal--  chemotherapy completed 06/ 2011  . NSVT (nonsustained ventricular tachycardia) [facility]) cardiologist-  dr crouitoru   Max rate 200 bpm, 28-beat, nearly 9-second episode recorded by pacemaker on November 15, asymptomatic   . Pacemaker pacemaker dependant due to CHB   1st Pacemaker Placement 1998 w/ New generator  12-31-2000 ; 08-04-2008;  11-29-2015  . PAD (peripheral artery disease) ([place])    followed by dr berry--- last duplex 08-05-2015  patents bilateral popliteal stents, bilateral SFA 30-49% stenosis  . Peripheral vascular disease with claudication ([place])    dx 2009 left popliteal occlusion with collaterals;  03- 2015  s/p  stenting right popliteal  by dr berry and left popliteal stenting by dr Andree Elk ([facility]) july 2015  . S/P CABG x 5    09-10-1988  . S/P drug eluting coronary stent placement 01/04/2006   01-04-2006--- DES to SVG to OM1, OM2, and IR, and SVG to PDA(x2 grafts)  and 03-04-2013  DES to SVR to OM  . Wears glasses     Past Surgical History:  Procedure Laterality Date  . ANAL FISSURE REPAIR  12-09-2000 and 1970's  . CARDIAC CATHETERIZATION  04/09/2000   patent grafts  . CARDIAC PACEMAKER PLACEMENT  11-15-1993   dr Cyndia Bent  . CARDIOVASCULAR STRESS TEST  07-28-2015  dr crouitoru   Intermediate risk nuclear study w/ large, severe, predominantly fixed inferior and apical defect; minimal reversibility inferior lateral wall; findings consistent w/ large prior infarct and trivial peri-infarct ischemia;  ef 43% w/ inferior hypokinesis and apical akinesis; mild LVE  . CORONARY ANGIOPLASTY WITH STENT PLACEMENT  01-04-2006  dr gamble   DES to pSVG to OM1,  SVG to OM2 &  IR, and mSVG to RCA dPDA (x2 grafts );  pLAD , pLCFX and  mRCA occluded;  LIMA to LAD mid-distal patent with occlusion beyond LIMA with collateral flow;  ef 30%  . CORONARY ARTERY BYPASS GRAFT  09/10/1988   dr Redmond Pulling   "CABG X5" (03/04/2013) LIMA to LAD,SVG to imtermediate OM1 OM II,SVG to PDA  . CYSTOSCOPY W/ RETROGRADES Bilateral 04/13/2016   Procedure: CYSTOSCOPY WITH RETROGRADE PYELOGRAM;  Surgeon: Alexis Frock, MD;  Location: [facility];  Service: Urology;  Laterality: Bilateral;  . EP IMPLANTABLE DEVICE N/A 11/29/2015   Procedure: Pacemaker Implant;  Surgeon: Sanda Klein, MD;  Location: [place] [facility];  Service: Cardiovascular;  Laterality: N/A;  new generator and new atrial, vertricular leads;  Patent attorney  . LEFT HEART CATHETERIZATION WITH CORONARY/GRAFT ANGIOGRAM N/A 03/04/2013   Procedure: LEFT HEART CATHETERIZATION WITH Beatrix Fetters;  Surgeon: Troy Sine, MD;   Location: [facility] CATH LAB;  Service: Cardiovascular;  Laterality: N/A;  . LOWER EXTREMITY ANGIOGRAM N/A 08/31/2013   Procedure: LOWER EXTREMITY ANGIOGRAM;  Surgeon: Lorretta Harp, MD;  Location: [facility] CATH LAB;  Service: Cardiovascular;  Laterality: N/A;  . PACEMAKER REPLACEMENT  12/31/2000;  08-04-2008  . PERCUTANEOUS CORONARY STENT INTERVENTION (PCI-S)  03/04/2013   Procedure: PERCUTANEOUS CORONARY STENT INTERVENTION (PCI-S);  Surgeon: Troy Sine, MD;  Location: [facility] CATH LAB;  Service: Cardiovascular;;  DES to S-OM for in-stent restenosis  . POPLITEAL ARTERY STENT Right 10-21-2013  dr berry   atherectomy, PTA , and IDEV stent x1  . POPLITEAL ARTERY STENT Left 07/ 2015  dr Andree [facility]k at [facility]   angioplasty / stent x2  . 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 SUPERFICIAL LYMPH NODE BIOPSY / EXCISION Left 2011   cervical lymph node  . TRANSURETHRAL RESECTION OF BLADDER TUMOR N/A 04/13/2016   Procedure: TRANSURETHRAL RESECTION OF BLADDER TUMOR (TURBT) with clot evacuation;  Surgeon: Alexis Frock, MD;  Location: [facility];  Service: Urology;  Laterality: N/A;  . VENOGRAM Left 11/29/2015   Procedure: Venogram;  Surgeon: Sanda Klein, MD;  Location: [place] [facility];  Service: Cardiovascular;  Laterality: Left;    Outpatient Medications Prior to Visit  Medication Sig Dispense Refill  . aspirin EC 81 MG tablet Take 81 mg by mouth daily.    . cetirizine (ZYRTEC) 10 MG tablet Take 10 mg by mouth every evening.     . cilostazol (PLETAL) 100 MG tablet TAKE 1 TABLET BY MOUTH  TWICE DAILY 180 tablet 3  . icosapent Ethyl (VASCEPA) 1 g capsule TAKE 2 CAPSULES BY MOUTH  TWICE DAILY 360 capsule 1  . metoprolol tartrate (LOPRESSOR) 50 MG tablet TAKE 1 AND 1/2 TABLETS BY  MOUTH TWICE DAILY 270 tablet 3  . Multiple Vitamins-Minerals (SENIOR MULTIVITAMIN PLUS PO) Take 1 tablet by mouth daily.    . rosuvastatin (CRESTOR) 40 MG tablet TAKE 1 TABLET BY MOUTH AT  BEDTIME  90 tablet 3  . isosorbide mononitrate (IMDUR) 30 MG 24 hr tablet Take 1 tablet (30 mg total) by mouth in the morning and at bedtime. 180 tablet 3  . lisinopril (PRINIVIL,ZESTRIL) 20 MG tablet Take 20 mg by mouth every morning.      No facility-administered medications prior to visit.     Allergies:   Azithromycin, Lactose intolerance (gi), Niacin and related, and Sulfa antibiotics   Social History   Socioeconomic History  . Marital status: Married  Spouse name: Not on file  . Number of children: Not on file  . Years of education: Not on file  . Highest education level: Not on file  Occupational History  . Not on file  Tobacco Use  . Smoking status: Former Smoker    Packs/day: 0.12    Years: 1.00    Pack years: 0.12    Types: Cigarettes    Quit date: 08/06/1956    Years since quitting: 64.4  . Smokeless tobacco: Never Used  Substance and Sexual Activity  . Alcohol use: No  . Drug use: No  . Sexual activity: Never  Other Topics Concern  . Not on file  Social History Narrative  . Not on file   Social Determinants of Health   Financial Resource Strain: Not on file  Food Insecurity: Not on file  Transportation Needs: Not on file  Physical Activity: Not on file  Stress: Not on file  Social Connections: Not on file     Family History:  The patient's family history includes Diabetes in his mother; Heart attack in his father; Hyperlipidemia in his sister and sister; Hypertension in his sister and sister; Stroke in his mother.   ROS:   Please see the history of present illness.   All other systems are reviewed and are negative.   PHYSICAL EXAM:   VS:  BP 110/78 (BP Location: Left Arm, Patient Position: Sitting, Cuff Size: Normal)   Pulse 62   Resp 20   Ht 5\' 9"  (1.753 m)   Wt 201 lb 3.2 oz (91.3 kg)   SpO2 98%   BMI 29.71 kg/m      General: Alert, oriented x3, no distress, healthy pacemaker site.  Overweight but not obese Head: no evidence of trauma, PERRL, EOMI,  no exophtalmos or lid lag, no myxedema, no xanthelasma; normal ears, nose and oropharynx Neck: normal jugular venous pulsations and no hepatojugular reflux; brisk carotid pulses without delay and no carotid bruits Chest: clear to auscultation, no signs of consolidation by percussion or palpation, normal fremitus, symmetrical and full respiratory excursions Cardiovascular: normal position and quality of the apical impulse, regular rhythm, normal first and paradoxically split second heart sounds, no murmurs, rubs or gallops Abdomen: no tenderness or distention, no masses by palpation, no abnormal pulsatility or arterial bruits, normal bowel sounds, no hepatosplenomegaly Extremities: no clubbing, cyanosis or edema; pedal pulses are very weak in the left lower extremity but he has normal radial ulnar and right foot pulses. Neurological: grossly nonfocal Psych: Normal mood and affect    Wt Readings from Last 3 Encounters:  01/09/21 201 lb 3.2 oz (91.3 kg)  10/25/20 200 lb (90.7 kg)  07/11/20 201 lb 3.2 oz (91.3 kg)    Studies/Labs Reviewed:   EKG:  EKG is ordered today.  It shows AV sequential pacing and a single PVC Recent Labs: 02/15/2020: Hemoglobin 12.7; Platelet Count 152   Lipid Panel  07/15/2020 Cholesterol 132, HDL 41, LDL 67, triglycerides 136, creatinine 1.38, potassium 4.5, normal liver function tests     Component Value Date/Time   CHOL 119 06/24/2014 0855   TRIG 185 (H) 06/24/2014 0855   HDL 35 (L) 06/24/2014 0855   CHOLHDL 3.4 06/24/2014 0855   VLDL 37 06/24/2014 0855   LDLCALC 47 06/24/2014 0855   ASSESSMENT:    1. Complete heart block ([place])   2. Coronary artery disease of autologous vein bypass graft with stable angina pectoris ([place])   3. PAD (peripheral artery disease) ([place])  4. Pacemaker   5. Cardiomyopathy, ischemic- EF 40-45% by echo 4/14   6. NSVT (nonsustained ventricular tachycardia) (HCC)   7. Essential hypertension   8. Asymptomatic stenosis of right  carotid artery without infarction   9. Dyslipidemia   10. History of lymphoma      PLAN:  In order of problems listed above:  1. CAD s/p CABG 1995, s/p stent SVG-OM 2007 and July 2014: CCS functional class II on a relatively high dose of beta-blockers and low-dose long-acting nitrates.  Unlikely to obtain additional benefit from beta-blockade since he has complete heart block and his heart rate is pacemaker determined.  Will increase his isosorbide to 30 mg in the morning and 60 mg in the evening, since that is when he has more of his symptoms.  We will also cut back on his lisinopril and use a low-dose of amlodipine to see if this helps with the angina.  We will also reevaluate for new perfusion abnormalities.  His last stress test was in 2016. 2. CHB: Pacemaker dependent. 3. PAD: As before, his lower extremity complaints sound much more like neurogenic rather than vascular claudication.  We discussed the fact that the cilostazol, while helping with the vascular complaints, may lower his anginal threshold.  For now we will continue taking the cilostazol and will hopefully will be able to resolve his complaints with the medication changes. Lower extremity arterial Dopplers performed in July 2021 showed ABI of 0.86 on the right and 0.81 on the left, essentially unchanged from the year before. 4. Pacemaker: He had new leads implanted in the new generator in 2017, but the old unipolar leads are still in place and he cannot have an MRI scan, unfortunately.  Normal device function on comprehensive check today.  Continue remote downloads every 3 months. 5. Cardiomyopathy, ischemic - EF 43%: He has never required diuretics.  Appears clinically euvolemic today. 6.  PVCs/NSVT: If anything, his burden of PVCs has declined from about 8% to about 4%.  He is on beta-blockers.  In the past used to have nonsustained VT but this has not been seen in quite a while. 7. HTN: Excellent control.  We will back off the ACE  inhibitor to allow introduction of the calcium channel blocker for its antianginal benefit. 8. Asymptomatic stenosis of right carotid artery without infarction: stable moderate stenosis by repeat carotid duplex ultrasound in February 2022.  No neurological complaints to suggest ischemic events. 9. HLP: LDL within target range.  Chronically borderline low HDL. 10. Lymphoma in remission  Medication Adjustments/Labs and Tests Ordered: Current medicines are reviewed at length with the patient today.  Concerns regarding medicines are outlined above.  Medication changes, Labs and Tests ordered today are listed in the Patient Instructions below. Patient Instructions  Medication Instructions:  START Amlodipine 5 mg once daily  INCREASE the Imdur to 30 mg in the morning and 60 mg in the evening DECREASE the Lisinopril to 10 mg once daily  *If you need a refill on your cardiac medications before your next appointment, please call your pharmacy*   Lab Work: None ordered If you have labs (blood work) drawn today and your tests are completely normal, you will receive your results only by: Marland Kitchen MyChart Message (if you have MyChart) OR . A paper copy in the mail If you have any lab test that is abnormal or we need to change your treatment, we will call you to review the results.   Testing/Procedures: Your physician has requested  that you have a lexiscan myoview. For further information please visit [email or web address]. Please follow instruction sheet, as given. This will take place at [place], [address]  How to prepare for your Myocardial Perfusion Test:  Do not eat or drink 3 hours prior to your test, except you may have water.  Do not consume products containing caffeine (regular or decaffeinated) 12 hours prior to your test. (ex: coffee, chocolate, sodas, tea).  Do bring a list of your current medications with you.  If not listed below, you may take your medications as normal.  Do  wear comfortable clothes (no dresses or overalls) and walking shoes, tennis shoes preferred (No heels or open toe shoes are allowed).  Do NOT wear cologne, perfume, aftershave, or lotions (deodorant is allowed).  The test will take approximately 3 to 4 hours to complete  If these instructions are not followed, your test will have to be rescheduled.   Follow-Up: At [facility], you and your health needs are our priority.  As part of our continuing mission to provide you with exceptional heart care, we have created designated Provider Care Teams.  These Care Teams include your primary Cardiologist (physician) and Advanced Practice Providers (APPs -  Physician Assistants and Nurse Practitioners) who all work together to provide you with the care you need, when you need it.  We recommend signing up for the patient portal called "MyChart".  Sign up information is provided on this After Visit Summary.  MyChart is used to connect with patients for Virtual Visits (Telemedicine).  Patients are able to view lab/test results, encounter notes, upcoming appointments, etc.  Non-urgent messages can be sent to your provider as well.   To learn more about what you can do with MyChart, go to [email or web address].    Your next appointment:   Follow up first available with an APP after the lexiscan Follow up in 3-4 months with Dr. [name]       Signed, Sanda Klein, MD  01/10/2021 5:41 PM    [address], [place], [place]  [address] Phone: [phone]; Fax: [phone]

## 2021-01-09 NOTE — Patient Instructions (Addendum)
Medication Instructions:  START Amlodipine 5 mg once daily  INCREASE the Imdur to 30 mg in the morning and 60 mg in the evening DECREASE the Lisinopril to 10 mg once daily  *If you need a refill on your cardiac medications before your next appointment, please call your pharmacy*   Lab Work: None ordered If you have labs (blood work) drawn today and your tests are completely normal, you will receive your results only by: Marland Kitchen MyChart Message (if you have MyChart) OR . A paper copy in the mail If you have any lab test that is abnormal or we need to change your treatment, we will call you to review the results.   Testing/Procedures: Your physician has requested that you have a lexiscan myoview. For further information please visit [email or web address]. Please follow instruction sheet, as given. This will take place at [place], [address]  How to prepare for your Myocardial Perfusion Test:  Do not eat or drink 3 hours prior to your test, except you may have water.  Do not consume products containing caffeine (regular or decaffeinated) 12 hours prior to your test. (ex: coffee, chocolate, sodas, tea).  Do bring a list of your current medications with you.  If not listed below, you may take your medications as normal.  Do wear comfortable clothes (no dresses or overalls) and walking shoes, tennis shoes preferred (No heels or open toe shoes are allowed).  Do NOT wear cologne, perfume, aftershave, or lotions (deodorant is allowed).  The test will take approximately 3 to 4 hours to complete  If these instructions are not followed, your test will have to be rescheduled.   Follow-Up: At [facility], you and your health needs are our priority.  As part of our continuing mission to provide you with exceptional heart care, we have created designated Provider Care Teams.  These Care Teams include your primary Cardiologist (physician) and Advanced Practice Providers (APPs -  Physician  Assistants and Nurse Practitioners) who all work together to provide you with the care you need, when you need it.  We recommend signing up for the patient portal called "MyChart".  Sign up information is provided on this After Visit Summary.  MyChart is used to connect with patients for Virtual Visits (Telemedicine).  Patients are able to view lab/test results, encounter notes, upcoming appointments, etc.  Non-urgent messages can be sent to your provider as well.   To learn more about what you can do with MyChart, go to [email or web address].    Your next appointment:   Follow up first available with an APP after the lexiscan Follow up in 3-4 months with Dr. [name]

## 2021-01-17 ENCOUNTER — Telehealth (HOSPITAL_COMMUNITY): Payer: Self-pay | Admitting: *Deleted

## 2021-01-17 NOTE — Telephone Encounter (Signed)
Close encounter 

## 2021-01-18 ENCOUNTER — Other Ambulatory Visit: Payer: Self-pay

## 2021-01-18 ENCOUNTER — Ambulatory Visit (HOSPITAL_COMMUNITY)
Admission: RE | Admit: 2021-01-18 | Discharge: 2021-01-18 | Disposition: A | Discharge status: Home / Self Care | Payer: Medicare Other | Source: Ambulatory Visit | Attending: Cardiovascular Disease | Admitting: Cardiovascular Disease

## 2021-01-18 DIAGNOSIS — I25718 Atherosclerosis of autologous vein coronary artery bypass graft(s) with other forms of angina pectoris: Secondary | ICD-10-CM

## 2021-01-18 LAB — MYOCARDIAL PERFUSION IMAGING
LV dias vol: 134 mL (ref 62–150)
LV sys vol: 86 mL
Peak HR: 76 {beats}/min
Rest HR: 61 {beats}/min
SDS: 6
SRS: 27
SSS: 33
TID: 0.89

## 2021-01-18 MED ORDER — REGADENOSON 0.4 MG/5ML IV SOLN
0.4000 mg | Freq: Once | INTRAVENOUS | Status: AC
  Administered 2021-01-18: 0.4 mg via INTRAVENOUS

## 2021-01-18 MED ORDER — AMINOPHYLLINE 25 MG/ML IV SOLN
75.0000 mg | Freq: Once | INTRAVENOUS | Status: AC
  Administered 2021-01-18: 75 mg via INTRAVENOUS

## 2021-01-18 MED ORDER — TECHNETIUM TC 99M TETROFOSMIN IV KIT
30.7000 | PACK | Freq: Once | INTRAVENOUS | Status: AC | PRN
  Administered 2021-01-18: 30.7 via INTRAVENOUS
  Filled 2021-01-18: qty 31

## 2021-01-18 MED ORDER — TECHNETIUM TC 99M TETROFOSMIN IV KIT
10.7000 | PACK | Freq: Once | INTRAVENOUS | Status: AC | PRN
  Administered 2021-01-18: 10.7 via INTRAVENOUS
  Filled 2021-01-18: qty 11

## 2021-01-19 ENCOUNTER — Ambulatory Visit (HOSPITAL_COMMUNITY)
Admission: RE | Admit: 2021-01-19 | Payer: Medicare Other | Source: Ambulatory Visit | Attending: Cardiovascular Disease | Admitting: Cardiovascular Disease

## 2021-01-25 NOTE — Progress Notes (Signed)
Cardiology Office Note:    Date:  01/26/2021   ID:  David [name], DOB 07/20/1933, MRN [id number]  PCP:  Mayra Neer, MD Referring MD: Mayra Neer, MD    [place]  Cardiologist:  Sanda Klein, MD  Reason for visit: Follow-up Stress test  History of Present Illness:    David [name] is a 87 y.o. male with a hx of heart block status post pacemaker, coronary artery disease s/p remote CABG, PAD of the lower extremities (right SFA stent, occluded L popliteal stent- f/b Dr. Gwenlyn Found), moderate carotid disease on right, non-Hodgkin's lymphoma in remission, and symptomatic PVCs.  Last saw Dr. [name] on 01/09/2021 noticed progressive angina with a lower threshold of exertion.  Pt noted symptoms most commonly occur in the evening especially if he already had dinner and goes out to do some yard work.  At this appt, his Imdur was increased and amlodipine was added to maximize his anti-anginal therapy.  With these changes, lisinopril dose was reduced.  Myoview Lexiscan stress order and showed reduced EF to 36% - prior infarct, but no ischemia. Findings similar to 2016 study,however, LVEF Is lower at 36% (down from 43%).  Today, he states his angina is stable. He cannot decipher if his angina has improved with Dr. Victorino December last med changes.  He states he gets chest tightness when he takes the trash out.  He states he can go through [employer] and do most of his ADLs without angina.  He states his legs bother him more than his heart - between his spinal stenosis causing feet numbness with prolonged standing and his PVD causing claudication.  He denies feet sores/discoloration.  Otherwise, He denies shortness of breath, syncope, orthopnea, PND or significant pedal edema.     He states he was concerned about the mention of EF drop on his lexiscan.    Past Medical History:  Diagnosis Date   Arthritis    "hands" (10/19/2013)   Bilateral carotid bruits 04/08/2020   Right greater  than left   Bladder tumor    CAD (coronary artery disease)    cardiologist-  dr crouitoru   Carotid artery disease (HCC)    moderate pRICA 41-93%,  7-90% LICA , >24% RECA last duplex 08-05-2015   Cataract of both eyes    Complete heart block (HCC)    GERD (gastroesophageal reflux disease)    Glaucoma, both eyes    Hematuria    History of MI (myocardial infarction)    1989   Hyperlipidemia    Hypertension    Ischemic cardiomyopathy    ef 40-45%  without clinical heart failure per cardiology note 12-28-2015 (nuclear stress ef 43% from 07-28-2015)   Left ventricular dysfunction    Non Hodgkin's lymphoma ([place]) dx 06/2010---  oncologist-  dr Benay Spice---  in clinical remission since 2013   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 -- lymphadenopathy to neck, mediastinal , and abdominal--  chemotherapy completed 06/ 2011   NSVT (nonsustained ventricular tachycardia) [facility]) cardiologist-  dr crouitoru   Max rate 200 bpm, 28-beat, nearly 9-second episode recorded by pacemaker on November 15, asymptomatic    Pacemaker pacemaker dependant due to CHB   1st Pacemaker Placement 1998 w/ New generator  12-31-2000 ; 08-04-2008;  11-29-2015   PAD (peripheral artery disease) ([place])    followed by dr berry--- last duplex 08-05-2015  patents bilateral popliteal stents, bilateral SFA 30-49% stenosis   Peripheral vascular disease with claudication ([place])  dx 2009 left popliteal occlusion with collaterals; 03- 2015  s/p  stenting right popliteal  by dr berry and left popliteal stenting by dr Andree Elk ([facility]) july 2015   S/P CABG x 5    09-10-1988   S/P drug eluting coronary stent placement 01/04/2006   01-04-2006--- DES to SVG to OM1, OM2, and IR, and SVG to PDA(x2 grafts)  and 03-04-2013  DES to SVR to OM   Wears glasses     Past Surgical History:  Procedure Laterality Date   ANAL FISSURE REPAIR  12-09-2000 and 1970's   CARDIAC CATHETERIZATION  04/09/2000   patent grafts    CARDIAC PACEMAKER PLACEMENT  11-15-1993   dr Cyndia Bent   CARDIOVASCULAR STRESS TEST  07-28-2015  dr crouitoru   Intermediate risk nuclear study w/ large, severe, predominantly fixed inferior and apical defect; minimal reversibility inferior lateral wall; findings consistent w/ large prior infarct and trivial peri-infarct ischemia;  ef 43% w/ inferior hypokinesis and apical akinesis; mild LVE   CORONARY ANGIOPLASTY WITH STENT PLACEMENT  01-04-2006  dr gamble   DES to pSVG to OM1, SVG to OM2 &  IR, and mSVG to RCA dPDA (x2 grafts );  pLAD , pLCFX and  mRCA occluded;  LIMA to LAD mid-distal patent with occlusion beyond LIMA with collateral flow;  ef 30%   CORONARY ARTERY BYPASS GRAFT  09/10/1988   dr Redmond Pulling   "CABG X5" (03/04/2013) LIMA to LAD,SVG to imtermediate OM1 OM II,SVG to PDA   CYSTOSCOPY W/ RETROGRADES Bilateral 04/13/2016   Procedure: CYSTOSCOPY WITH RETROGRADE PYELOGRAM;  Surgeon: Alexis Frock, MD;  Location: [facility];  Service: Urology;  Laterality: Bilateral;   EP IMPLANTABLE DEVICE N/A 11/29/2015   Procedure: Pacemaker Implant;  Surgeon: Sanda Klein, MD;  Location: [place] [facility];  Service: Cardiovascular;  Laterality: N/A;  new generator and new atrial, vertricular leads;  [employer] Accolade   LEFT HEART CATHETERIZATION WITH CORONARY/GRAFT ANGIOGRAM N/A 03/04/2013   Procedure: LEFT HEART CATHETERIZATION WITH Beatrix Fetters;  Surgeon: Troy Sine, MD;  Location: [facility] CATH LAB;  Service: Cardiovascular;  Laterality: N/A;   LOWER EXTREMITY ANGIOGRAM N/A 08/31/2013   Procedure: LOWER EXTREMITY ANGIOGRAM;  Surgeon: Lorretta Harp, MD;  Location: [facility] CATH LAB;  Service: Cardiovascular;  Laterality: N/A;   PACEMAKER REPLACEMENT  12/31/2000;  08-04-2008   PERCUTANEOUS CORONARY STENT INTERVENTION (PCI-S)  03/04/2013   Procedure: PERCUTANEOUS CORONARY STENT INTERVENTION (PCI-S);  Surgeon: Troy Sine, MD;  Location[facility];  Service: Cardiovascular;;  DES  to S-OM for in-stent restenosis   POPLITEAL ARTERY STENT Right 10-21-2013  dr berry   atherectomy, PTA , and IDEV stent x1   POPLITEAL ARTERY STENT Left 07/ 2015  dr Andree Elk at [facility]   angioplasty / stent x2   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   SUPERFICIAL LYMPH NODE BIOPSY / EXCISION Left 2011   cervical lymph node   TRANSURETHRAL RESECTION OF BLADDER TUMOR N/A 04/13/2016   Procedure: TRANSURETHRAL RESECTION OF BLADDER TUMOR (TURBT) with clot evacuation;  Surgeon: Alexis Frock, MD;  [facility];  Service: Urology;  Laterality: N/A;   VENOGRAM Left 11/29/2015   Procedure: Venogram;  Surgeon: Sanda Klein, MD;  Location: [place] [facility];  Service: Cardiovascular;  Laterality: Left;    Current Medications: Current Meds  Medication Sig   aspirin EC 81 MG tablet Take 81 mg by mouth daily.   cetirizine (ZYRTEC) 10 MG tablet Take  10 mg by mouth every evening.    cilostazol (PLETAL) 100 MG tablet TAKE 1 TABLET BY MOUTH  TWICE DAILY   icosapent Ethyl (VASCEPA) 1 g capsule TAKE 2 CAPSULES BY MOUTH  TWICE DAILY   isosorbide mononitrate (IMDUR) 30 MG 24 hr tablet Take one tablet, 30 mg, in the morning and 60 mg, 2 tablets, in the evening.   lisinopril (ZESTRIL) 20 MG tablet Take 1 tablet (20 mg total) by mouth daily.   metoprolol tartrate (LOPRESSOR) 50 MG tablet TAKE 1 AND 1/2 TABLETS BY  MOUTH TWICE DAILY   Multiple Vitamins-Minerals (SENIOR MULTIVITAMIN PLUS PO) Take 1 tablet by mouth daily.   rosuvastatin (CRESTOR) 40 MG tablet TAKE 1 TABLET BY MOUTH AT  BEDTIME   [DISCONTINUED] amLODipine (NORVASC) 5 MG tablet Take 1 tablet (5 mg total) by mouth daily.   [DISCONTINUED] lisinopril (ZESTRIL) 10 MG tablet Take 1 tablet (10 mg total) by mouth daily.     Allergies:   Azithromycin, Lactose intolerance (gi), Niacin and related, and Sulfa antibiotics   Social History   Socioeconomic History   Marital status: Married    Spouse name: Not  on file   Number of children: Not on file   Years of education: Not on file   Highest education level: Not on file  Occupational History   Not on file  Tobacco Use   Smoking status: Former    Packs/day: 0.12    Years: 1.00    Pack years: 0.12    Types: Cigarettes    Quit date: 08/06/1956    Years since quitting: 64.5   Smokeless tobacco: Never  Substance and Sexual Activity   Alcohol use: No   Drug use: No   Sexual activity: Never  Other Topics Concern   Not on file  Social History Narrative   Not on file   Social Determinants of Health   Financial Resource Strain: Not on file  Food Insecurity: Not on file  Transportation Needs: Not on file  Physical Activity: Not on file  Stress: Not on file  Social Connections: Not on file     Family History: The patient's family history includes Diabetes in his mother; Heart attack in his father; Hyperlipidemia in his sister and sister; Hypertension in his sister and sister; Stroke in his mother.  ROS:   Please see the history of present illness.     EKGs/Labs/Other Studies Reviewed:    Recent Labs: 02/15/2020: Hemoglobin 12.7; Platelet Count 152  Recent Lipid Panel    Component Value Date/Time   CHOL 119 06/24/2014 0855   TRIG 185 (H) 06/24/2014 0855   HDL 35 (L) 06/24/2014 0855   CHOLHDL 3.4 06/24/2014 0855   VLDL 37 06/24/2014 0855   LDLCALC 47 06/24/2014 0855    Physical Exam:    VS:  BP (!) 126/57   Pulse 60   Ht 5' 9.5" (1.765 m)   Wt 199 lb 8 oz (90.5 kg)   SpO2 97%   BMI 29.04 kg/m     Wt Readings from Last 3 Encounters:  01/26/21 199 lb 8 oz (90.5 kg)  01/18/21 201 lb (91.2 kg)  01/09/21 201 lb 3.2 oz (91.3 kg)     GEN:  Well nourished, well developed in no acute distress HEENT: Normal NECK: No JVD; No carotid bruits CARDIAC: RRR, no murmurs, rubs, gallops RESPIRATORY:  Clear to auscultation without rales, wheezing or rhonchi  ABDOMEN: Soft, non-tender, non-distended MUSCULOSKELETAL: No edema; No  deformity  SKIN: Warm and  dry, varicose veins in LE  NEUROLOGIC:  Alert and oriented PSYCHIATRIC:  Normal affect   ASSESSMENT AND PLAN   1. Coronary artery disease of autologous vein bypass graft with stable angina pectoris ([place]) - s/p CABG 1995, s/p stent SVG-OM 2007 and July 2014 - Lexiscan stress 01/2021: no ischemia  --> Overall he has mild angina with significant exertion which goes away quickly with rest. ---> stop amlodipine given no improvement in angina ---> continue Imdur at current dose.  He does get a heat feeling in his head at night, though he doesn't classify it as a headache.  I told him it may be related to Imdur and to let us know if we would like to change dosing to once daily in the morning.  He states he has PM dosing given he has more angina at night.  He takes PM Imdur right after dinner.  We reviewed that Imdur peak plasma is at 30-60 minutes.  He will continue BID dosing for now.   --> continue ASA, statin, lopressor.  2. Dyslipidemia -- LDL at goal <70.   --> Continue Crestor.  3. Essential hypertension, well controlled. --> Stop amlodipine and increase lisinopril back to 20mg  daily  4. Cardiomyopathy, ischemic- EF 35-45%, euvolemic - 2D echo 11/2012: EF 40-45% with inferior/inferolateral hypokinesis  - Lexiscan stress 2022 estimates EF 36%. --> Check ECHO.  Increase lisinopril back to 20mg  daily.  --> Given no HF symptoms and his age, will not be super aggressive with titrating HF regimen.  May consider switching to Toprol XL.  Disposition: Follow-up in 3 months.      Medication Adjustments/Labs and Tests Ordered: Current medicines are reviewed at length with the patient today.  Concerns regarding medicines are outlined above.  Orders Placed This Encounter  Procedures   ECHOCARDIOGRAM COMPLETE    Meds ordered this encounter  Medications   lisinopril (ZESTRIL) 20 MG tablet    Sig: Take 1 tablet (20 mg total) by mouth daily.    Dispense:  90 tablet     Refill:  3     Patient Instructions  Medication Instructions:  INCREASE LISINOPRIL 20MG  DAILY  STOP AMLODIPINE  *If you need a refill on your cardiac medications before your next appointment, please call your pharmacy*  Lab Work:   Testing/Procedures:  NONE    NONE  Special Instructions Echocardiogram - Your physician has requested that you have an echocardiogram. Echocardiography is a painless test that uses sound waves to create images of your heart. It provides your doctor with information about the size and shape of your heart and how well your heart's chambers and valves are working. This procedure takes approximately one hour. There are no restrictions for this procedure. This will be performed at our [facility] location - [address], [address].   Follow-Up: Your next appointment:  3 month(s) In Person with Sanda Klein, MD IF UNAVAILABLE JESSE CLEAVER, FNP-C   At [facility], you and your health needs are our priority.  As part of our continuing mission to provide you with exceptional heart care, we have created designated Provider Care Teams.  These Care Teams include your primary Cardiologist (physician) and Advanced Practice Providers (APPs -  Physician Assistants and Nurse Practitioners) who all work together to provide you with the care you need, when you need it.     Signed, Warren Lacy, PA-C  01/26/2021 12:01 PM    [facility]

## 2021-01-26 ENCOUNTER — Other Ambulatory Visit: Payer: Self-pay

## 2021-01-26 ENCOUNTER — Encounter: Payer: Self-pay | Admitting: General Practice

## 2021-01-26 ENCOUNTER — Ambulatory Visit (INDEPENDENT_AMBULATORY_CARE_PROVIDER_SITE_OTHER): Payer: Medicare Other | Admitting: Physician Assistant

## 2021-01-26 VITALS — BP 126/57 | HR 60 | Ht 69.5 in | Wt 199.5 lb

## 2021-01-26 DIAGNOSIS — E785 Hyperlipidemia, unspecified: Secondary | ICD-10-CM | POA: Diagnosis not present

## 2021-01-26 DIAGNOSIS — I255 Ischemic cardiomyopathy: Secondary | ICD-10-CM

## 2021-01-26 DIAGNOSIS — I10 Essential (primary) hypertension: Secondary | ICD-10-CM

## 2021-01-26 DIAGNOSIS — I25718 Atherosclerosis of autologous vein coronary artery bypass graft(s) with other forms of angina pectoris: Secondary | ICD-10-CM | POA: Diagnosis not present

## 2021-01-26 MED ORDER — LISINOPRIL 20 MG PO TABS: 20.0000 mg | ORAL_TABLET | Freq: Every day | ORAL | 3 refills | Status: DC

## 2021-01-26 NOTE — Progress Notes (Signed)
Remote pacemaker transmission.   

## 2021-01-26 NOTE — Patient Instructions (Signed)
Medication Instructions:  INCREASE LISINOPRIL 20MG  DAILY  STOP AMLODIPINE  *If you need a refill on your cardiac medications before your next appointment, please call your pharmacy*  Lab Work:   Testing/Procedures:  NONE    NONE  Special Instructions Echocardiogram - Your physician has requested that you have an echocardiogram. Echocardiography is a painless test that uses sound waves to create images of your heart. It provides your doctor with information about the size and shape of your heart and how well your heart's chambers and valves are working. This procedure takes approximately one hour. There are no restrictions for this procedure. This will be performed at our [facility] location - [address].   Follow-Up: Your next appointment:  3 month(s) In Person with Sanda Klein, MD IF UNAVAILABLE JESSE CLEAVER, FNP-C   At [facility], you and your health needs are our priority.  As part of our continuing mission to provide you with exceptional heart care, we have created designated Provider Care Teams.  These Care Teams include your primary Cardiologist (physician) and Advanced Practice Providers (APPs -  Physician Assistants and Nurse Practitioners) who all work together to provide you with the care you need, when you need it.

## 2021-02-14 ENCOUNTER — Other Ambulatory Visit: Payer: Self-pay

## 2021-02-14 ENCOUNTER — Inpatient Hospital Stay: Payer: Medicare Other | Attending: Oncology | Admitting: Oncology

## 2021-02-14 VITALS — BP 136/70 | HR 75 | Temp 97.8°F | Resp 20 | Ht 69.0 in | Wt 199.8 lb

## 2021-02-14 DIAGNOSIS — Z8572 Personal history of non-Hodgkin lymphomas: Secondary | ICD-10-CM | POA: Diagnosis not present

## 2021-02-14 DIAGNOSIS — C8208 Follicular lymphoma grade I, lymph nodes of multiple sites: Secondary | ICD-10-CM | POA: Diagnosis not present

## 2021-02-14 DIAGNOSIS — Z9221 Personal history of antineoplastic chemotherapy: Secondary | ICD-10-CM | POA: Insufficient documentation

## 2021-02-14 NOTE — Progress Notes (Signed)
  David [name] OFFICE PROGRESS NOTE   Diagnosis: Non-Hodgkin's lymphoma  INTERVAL HISTORY:   David [name] returns as scheduled.  He feels well.  Good appetite and energy.  No fever or night sweats.  No palpable lymph nodes.  He has exertional chest tightness.  He is being evaluated by cardiology.  He also has numbness in the legs with exertion.  He has been diagnosed with spinal stenosis.  A steroid injection did not help.  Objective:  Vital signs in last 24 hours:  Blood pressure 136/70, pulse 75, temperature 97.8 F (36.6 C), temperature source Oral, resp. rate 20, height 5\' 9"  (1.753 m), weight 199 lb 12.8 oz (90.6 kg), SpO2 97 %. HEENT: Neck without mass Lymphatics: No cervical, supraclavicular, axillary, or inguinal nodes.  Prominent left greater than right axillary fat pad. Resp: Lungs clear bilaterally Cardio: Regular rate and rhythm GI: No mass, nontender, no hepatosplenomegaly Vascular: No leg edema    Lab Results:  Lab Results  Component Value Date   WBC 5.6 02/15/2020   HGB 12.7 (L) 02/15/2020   HCT 37.1 (L) 02/15/2020   MCV 90.7 02/15/2020   PLT 152 02/15/2020   NEUTROABS 3.6 02/15/2020    CMP  Lab Results  Component Value Date   NA 137 05/07/2016   K 4.4 05/07/2016   CL 105 05/07/2016   CO2 24 05/07/2016   GLUCOSE 121 (H) 05/07/2016   BUN 20 05/07/2016   CREATININE 1.18 05/07/2016   CALCIUM 9.2 05/07/2016   PROT 6.8 06/24/2014   ALBUMIN 4.6 06/24/2014   AST 26 06/24/2014   ALT 27 06/24/2014   ALKPHOS 47 06/24/2014   BILITOT 0.6 06/24/2014   GFRNONAA 56 (L) 05/07/2016   GFRAA >60 05/07/2016    Medications: I have reviewed the patient's current medications.   Assessment/Plan: Non-Hodgkin's lymphoma, follicular B-cell, low grade, involving a left cervical lymph node with a staging evaluation revealing evidence for advanced stage disease. There was cervical, mediastinal, and abdominal lymphadenopathy and a question of splenic involvement  on staging scans. He completed 4 cycles of fludarabine/rituximab with resolution of the palpable lymphadenopathy. The last chemotherapy was given in June 2011.   Skin rash following cycle #1 of fludarabine/rituximab, likely related to allopurinol or Bactrim. The Bactrim prophylaxis was discontinued. History of Chronic Thrombocytopenia secondary chemotherapy and non-Hodgkin's lymphoma-normal today History of neutropenia secondary to chemotherapy. Injection site reaction to a pneumococcal vaccine in October 2011. History of coronary artery disease and peripheral vascular disease, followed by cardiology papillary urothelial carcinoma, status post transurethral resection and intravesical mitomycin-C 04/13/2016 Spinal stenosis    Disposition: David [name] is in remission from lymphoma.  He would like to continue follow-up at the Cancer center.  He will return for an office visit in 1 year.  He will be sure Dr. Brigitte Pulse checks a CBC with his yearly physical later this year.  He will continue follow-up with cardiology for management of coronary artery disease.  Betsy Coder, MD  02/14/2021  10:51 AM

## 2021-02-15 ENCOUNTER — Ambulatory Visit (HOSPITAL_COMMUNITY)
Admission: RE | Admit: 2021-02-15 | Discharge: 2021-02-15 | Disposition: A | Discharge status: Home / Self Care | Payer: Medicare Other | Source: Ambulatory Visit | Attending: Cardiovascular Disease | Admitting: Cardiovascular Disease

## 2021-02-15 ENCOUNTER — Other Ambulatory Visit (HOSPITAL_COMMUNITY): Payer: Self-pay | Admitting: Cardiovascular Disease

## 2021-02-15 DIAGNOSIS — Z95820 Peripheral vascular angioplasty status with implants and grafts: Secondary | ICD-10-CM

## 2021-02-15 DIAGNOSIS — I6529 Occlusion and stenosis of unspecified carotid artery: Secondary | ICD-10-CM | POA: Diagnosis not present

## 2021-02-15 DIAGNOSIS — I739 Peripheral vascular disease, unspecified: Secondary | ICD-10-CM | POA: Diagnosis not present

## 2021-02-15 DIAGNOSIS — I442 Atrioventricular block, complete: Secondary | ICD-10-CM | POA: Diagnosis not present

## 2021-02-23 ENCOUNTER — Ambulatory Visit (HOSPITAL_COMMUNITY): Payer: Medicare Other | Attending: Cardiology

## 2021-02-23 ENCOUNTER — Other Ambulatory Visit: Payer: Self-pay

## 2021-02-23 DIAGNOSIS — I255 Ischemic cardiomyopathy: Secondary | ICD-10-CM | POA: Diagnosis not present

## 2021-02-23 LAB — ECHOCARDIOGRAM COMPLETE
Area-P 1/2: 3.34 cm2
S' Lateral: 4.2 cm

## 2021-02-23 MED ORDER — PERFLUTREN LIPID MICROSPHERE
1.0000 mL | INTRAVENOUS | Status: AC | PRN
  Administered 2021-02-23: 1 mL via INTRAVENOUS

## 2021-02-28 ENCOUNTER — Other Ambulatory Visit: Payer: Self-pay | Admitting: Cardiovascular Disease

## 2021-02-28 ENCOUNTER — Telehealth: Payer: Self-pay

## 2021-02-28 NOTE — Telephone Encounter (Addendum)
Spoke with patient provided echo results.----- Message from Warren Lacy, PA-C sent at 02/27/2021  4:19 PM EDT ----- Your ejection fraction on echo has improved to 45 to 50% from 40 to 45% in 2014.  The right side of your heart is normal.  You have no significant valve disease.  This is great news overall.

## 2021-03-04 IMAGING — XA Imaging study
2 series · 2 of 2 positions shown · non-contrast
Comparison: none

CLINICAL DATA: Lumbosacral spondylosis without myelopathy.
Bilateral lower leg pain and numbness, worse on the left. Severe
spinal canal stenosis at L4-L5.

[Series 1: ortho standard · 1 of 1 slices shown (1 of 2)]
[im 1/1]
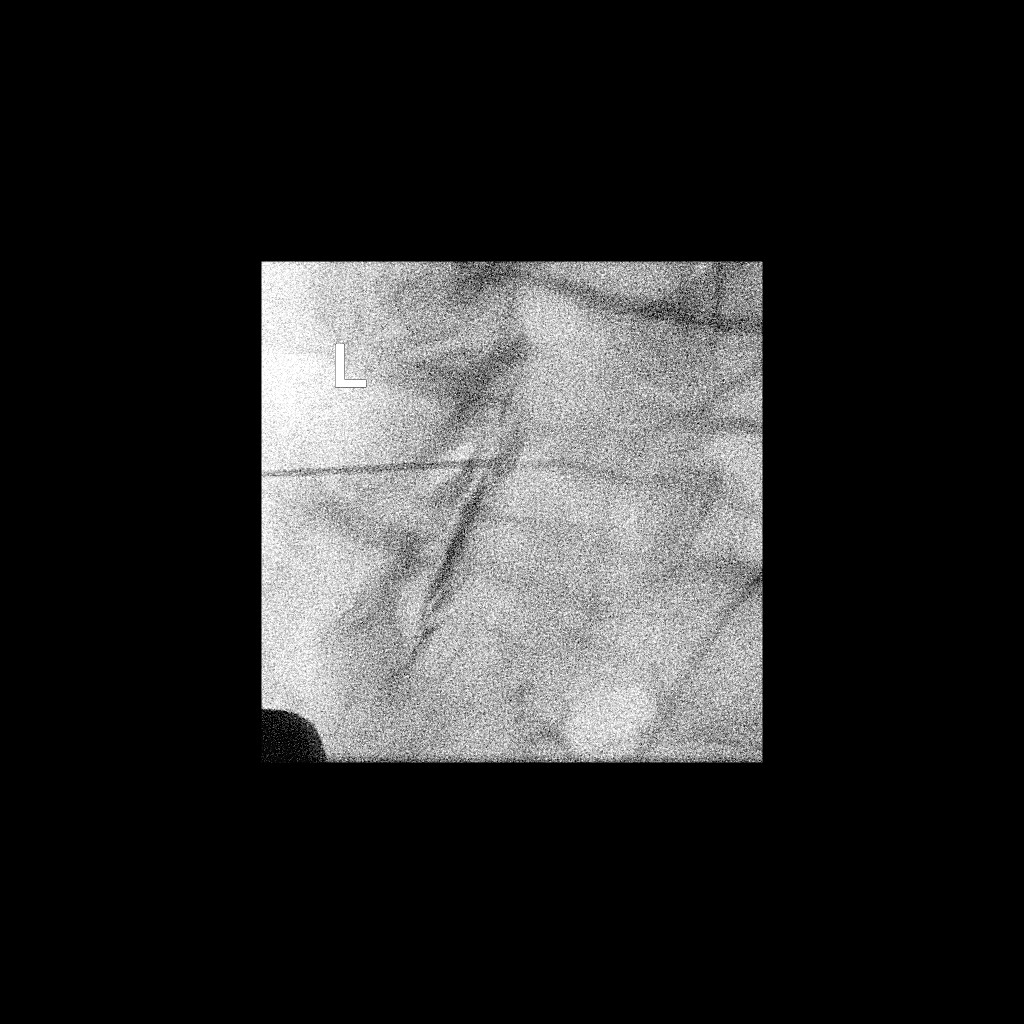

[Series 2: ortho standard · 1 of 1 slices shown (2 of 2)]
[im 1/1]
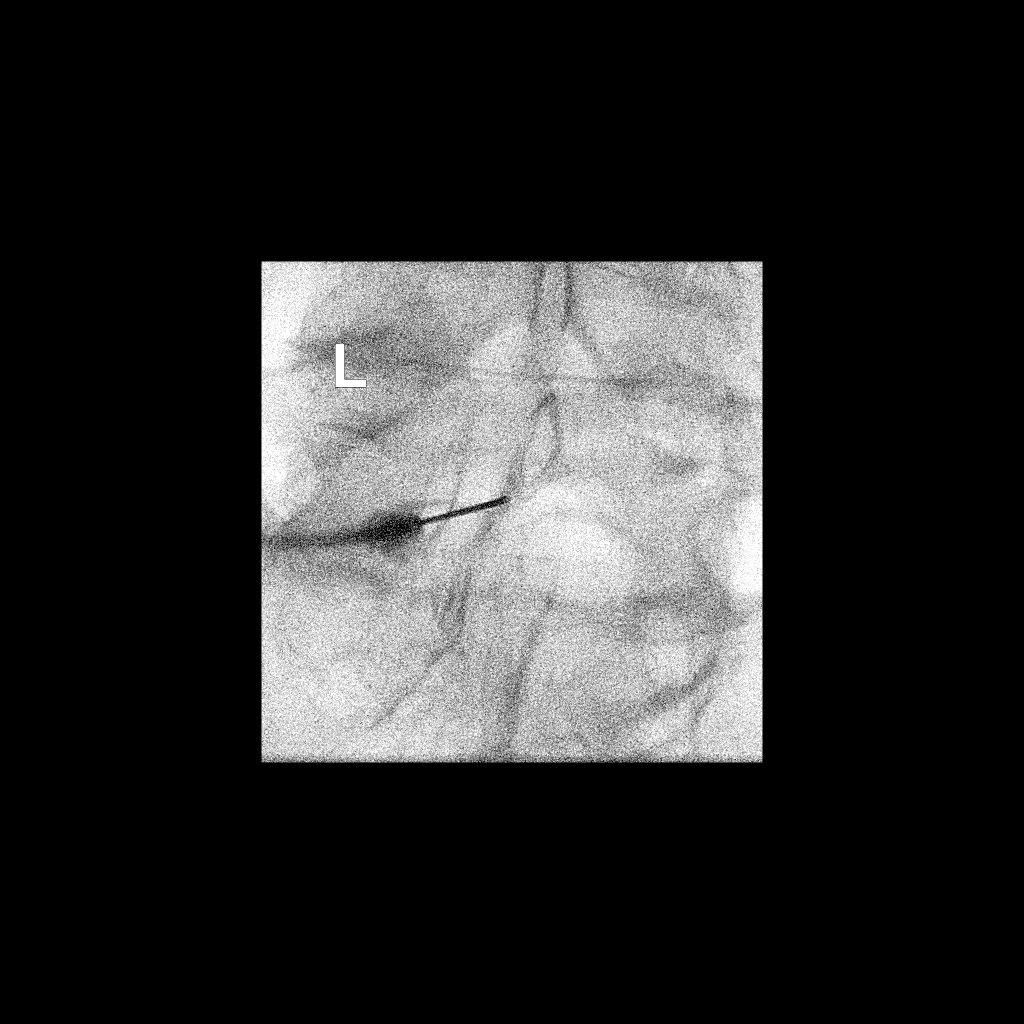

[2 of 2 positions shown; findings below may reference images not displayed]

FLUOROSCOPY TIME:  Radiation Exposure Index (as provided by the
fluoroscopic device): 1.5 mGy

Fluoroscopy Time:  16 seconds

Number of Acquired Images:  0

PROCEDURE:
The procedure, risks, benefits, and alternatives were explained to
the patient. Questions regarding the procedure were encouraged and
answered. The patient understands and consents to the procedure.

LUMBAR EPIDURAL INJECTION:

An interlaminar approach was performed on the left at L5-S1. The
overlying skin was cleansed and anesthetized. A 3.5 inch 20 gauge
epidural needle was advanced using loss-of-resistance technique.

DIAGNOSTIC EPIDURAL INJECTION:

Injection of Isovue-M 200 shows a good epidural pattern with spread
above and below the level of needle placement, primarily on the
left. No vascular opacification is seen.

THERAPEUTIC EPIDURAL INJECTION:

120 mg of Depo-Medrol mixed with 3 mL of 1% lidocaine were
instilled. The procedure was well-tolerated, and the patient was
discharged thirty minutes following the injection in good condition.

COMPLICATIONS:
None immediate.
IMPRESSION: Technically successful interlaminar epidural injection on the left
at L5-S1.

## 2021-03-21 DIAGNOSIS — Z23 Encounter for immunization: Secondary | ICD-10-CM | POA: Diagnosis not present

## 2021-03-28 ENCOUNTER — Other Ambulatory Visit: Payer: Self-pay | Admitting: Cardiovascular Disease

## 2021-04-04 ENCOUNTER — Ambulatory Visit (INDEPENDENT_AMBULATORY_CARE_PROVIDER_SITE_OTHER): Payer: Medicare Other

## 2021-04-04 DIAGNOSIS — I442 Atrioventricular block, complete: Secondary | ICD-10-CM | POA: Diagnosis not present

## 2021-04-04 LAB — CUP PACEART REMOTE DEVICE CHECK
Battery Remaining Longevity: 96 mo
Battery Remaining Percentage: 91 %
Brady Statistic RA Percent Paced: 69 %
Brady Statistic RV Percent Paced: 97 %
Date Time Interrogation Session: 20220830022100
Implantable Lead Implant Date: 20170425
Implantable Lead Implant Date: 20170425
Implantable Lead Location: 753859
Implantable Lead Location: 753860
Implantable Lead Model: 7741
Implantable Lead Model: 7742
Implantable Lead Serial Number: 720495
Implantable Lead Serial Number: 763888
Implantable Pulse Generator Implant Date: 20170425
Lead Channel Impedance Value: 640 Ohm
Lead Channel Impedance Value: 832 Ohm
Lead Channel Pacing Threshold Amplitude: 0.7 V
Lead Channel Pacing Threshold Pulse Width: 0.4 ms
Lead Channel Setting Pacing Amplitude: 1.2 V
Lead Channel Setting Pacing Amplitude: 2.5 V
Lead Channel Setting Pacing Pulse Width: 0.4 ms
Lead Channel Setting Sensing Sensitivity: 2.5 mV
Pulse Gen Serial Number: 718025

## 2021-04-17 ENCOUNTER — Ambulatory Visit (INDEPENDENT_AMBULATORY_CARE_PROVIDER_SITE_OTHER): Payer: Medicare Other | Admitting: Cardiovascular Disease

## 2021-04-17 ENCOUNTER — Other Ambulatory Visit: Payer: Self-pay

## 2021-04-17 ENCOUNTER — Encounter: Payer: Self-pay | Admitting: Cardiovascular Disease

## 2021-04-17 VITALS — BP 124/60 | HR 79 | Ht 69.5 in | Wt 200.0 lb

## 2021-04-17 DIAGNOSIS — I255 Ischemic cardiomyopathy: Secondary | ICD-10-CM | POA: Diagnosis not present

## 2021-04-17 DIAGNOSIS — I10 Essential (primary) hypertension: Secondary | ICD-10-CM | POA: Diagnosis not present

## 2021-04-17 DIAGNOSIS — I442 Atrioventricular block, complete: Secondary | ICD-10-CM

## 2021-04-17 DIAGNOSIS — I472 Ventricular tachycardia: Secondary | ICD-10-CM

## 2021-04-17 DIAGNOSIS — I25718 Atherosclerosis of autologous vein coronary artery bypass graft(s) with other forms of angina pectoris: Secondary | ICD-10-CM

## 2021-04-17 DIAGNOSIS — I6529 Occlusion and stenosis of unspecified carotid artery: Secondary | ICD-10-CM | POA: Diagnosis not present

## 2021-04-17 DIAGNOSIS — I4729 Other ventricular tachycardia: Secondary | ICD-10-CM

## 2021-04-17 DIAGNOSIS — I739 Peripheral vascular disease, unspecified: Secondary | ICD-10-CM

## 2021-04-17 DIAGNOSIS — Z950 Presence of cardiac pacemaker: Secondary | ICD-10-CM

## 2021-04-17 DIAGNOSIS — Z8579 Personal history of other malignant neoplasms of lymphoid, hematopoietic and related tissues: Secondary | ICD-10-CM

## 2021-04-17 DIAGNOSIS — E785 Hyperlipidemia, unspecified: Secondary | ICD-10-CM

## 2021-04-17 DIAGNOSIS — Z8572 Personal history of non-Hodgkin lymphomas: Secondary | ICD-10-CM

## 2021-04-17 NOTE — Progress Notes (Signed)
Patient ID: David [name], male   DOB: [birth year], 87 y.o.   MRN: [id number] Patient ID: David [name], male   DOB: 04-06-1933, 87 y.o.   MRN: [id number]    Cardiology Office Note    Date:  04/18/2021   ID:  David [name], DOB 06/18/1933, MRN [id number]  PCP:  David Neer, MD  Cardiologist:  David Burow, MD (PAD);  David Klein, MD   No chief complaint on file.   History of Present Illness:  David [name] is a 87 y.o. male David [name] is a 87 y.o. male who presents for follow-up on his pacemaker, pacemaker dependent due to complete heart block. He also has coronary artery disease, PAD of the lower extremities (has occluded popliteal stents in the left lower extremity), symptomatic PVCs (and one episode of lengthy nonsustained VT detected by his pacemaker).  He is generally doing well, no change in symptoms.  Continues to have CCS functional class II exertional angina.  It happens when he exerts himself in the yard and resolves promptly when he rests.  Adding amlodipine did not have any beneficial effect.  He is no longer taking that.  Since the symptoms resolved very promptly when he rests, he has not taking any nitroglycerin since his last appointment.  He is on moderate dose long-acting nitrates and beta-blocker.  He denies exertional dyspnea, orthopnea, PND or lower extremity edema.  He is not bothered at all by claudication anymore.  He has not had any focal neurological events and denies dizziness palpitations or syncope.  He has not had recent problems with claudication, but has bilateral leg numbness and pain at rest suggestive of neuropathy related to his back problems worse in his left foot compared to the right.    01/18/2021 nuclear stress test did not show reversible ischemia, confirmed the known inferior and apical scar.  EF 36%.  [employer] model number [id number], serial number [id number] Right atrial lead (new) [employer] MRI, model number  [id number], serial number [id number] Right ventricular lead (new)  [employer] MRI, model number [id number], serial number [id number]. Old unipolar leads are still in place so that he is not MRI conditional despite the fact that the individual components of his new system are MRI conditional.  Pacemaker interrogation shows normal device function.  He is pacemaker dependent due to complete heart block.  He has 69% atrial pacing with a fair heart rate histogram distribution.  He has 96% ventricular pacing with the remainder 4% beats made up by frequent PVCs.  He has had 3 very brief episodes of paroxysmal atrial tachycardia for maximum of 9 beats.  There has been no atrial fibrillation and no ventricular tachycardia.  Generator longevity is estimated at about 7.5 years.  No escape rhythm is present.  His most recent lipid profile in December 2021 shows total cholesterol 132, LDL 67, HDL 41.  His creatinine is 1.38 and his liver function tests were normal.    David [name] has had numerous coronary revascularization procedures, most recently a stent in the saphenous vein graft to the oblique marginal artery in July 2014. He is now almost 20 years status post coronary bypass surgery. The same bypass graft had received a stent in 2007. He has mild ischemic cardiomyopathy with an ejection fraction of 40-45%, without clinical  heart failure. The nuclear stress test on July 28, 2015 shows old scar in the apex and inferior wall. LVEF is calculated at 43% (identical  to 2014 and 2015).   He had occlusion of the left popliteal artery with distal reconstitution via collaterals. He had successful recanalization with placement of 2 stents by David [name] in 2015 . This led to marked improvement in his claudication, but the stents subsequently occluded.  Claudication improved with cilostazol. He is pacemaker dependent secondary to complete heart block. He had a generator changeout in 2017. Interrogation of his device today again  shows some issues with "noise" on the atrial channel but none on the ventricular channel.  Moderate carotid artery disease (stable 60-79% proximal internal carotid artery stenosis on the right, mild plaque on the left), last duplex US was February 11, 2015.   He has a history of non-Hodgkin's lymphoma with a large neck mass but also mediastinal and abdominal lymphadenopathy treated with radiotherapy to the cervical area in 2011 followed by chemotherapy with Rituxan and fludarabine. He is considered to be in remission.   He has undergone cystoscopic resection of a bladder tumor and bilateral cataract surgery.    Past Medical History:  Diagnosis Date   Arthritis    "hands" (10/19/2013)   Bilateral carotid bruits 04/08/2020   Right greater than left   Bladder tumor    CAD (coronary artery disease)    cardiologist-  dr crouitoru   Carotid artery disease (HCC)    moderate pRICA A999333,  123456 LICA , [id number] RECA last duplex 08-05-2015   Cataract of both eyes    Complete heart block (HCC)    GERD (gastroesophageal reflux disease)    Glaucoma, both eyes    Hematuria    History of [name] (myocardial infarction)    1989   Hyperlipidemia    Hypertension    Ischemic cardiomyopathy    ef 40-45%  without clinical heart failure per cardiology note 12-28-2015 (nuclear stress ef 43% from 07-28-2015)   Left ventricular dysfunction    Non Hodgkin's lymphoma ([place]) dx 06/2010---  oncologist-  dr David [name]---  in clinical remission since 2013   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 -- lymphadenopathy to neck, mediastinal , and abdominal--  chemotherapy completed 06/ 2011   NSVT (nonsustained ventricular tachycardia) [facility]) cardiologist-  dr crouitoru   Max rate 200 bpm, 28-beat, nearly 9-second episode recorded by pacemaker on November 15, asymptomatic    Pacemaker pacemaker dependant due to CHB   1st Pacemaker Placement 1998 w/ New generator  12-31-2000 ; 08-04-2008;  11-29-2015   PAD  (peripheral artery disease) ([place])    followed by dr berry--- last duplex 08-05-2015  patents bilateral popliteal stents, bilateral SFA 30-49% stenosis   Peripheral vascular disease with claudication ([place])    dx 2009 left popliteal occlusion with collaterals; 03- 2015  s/p  stenting right popliteal  by dr berry and left popliteal stenting by dr Andree [name] ([facility]) july 2015   S/P CABG x 5    09-10-1988   S/P drug eluting coronary stent placement 01/04/2006   01-04-2006--- DES to SVG to OM1, OM2, and IR, and SVG to PDA(x2 grafts)  and 03-04-2013  DES to SVR to OM   Wears glasses     Past Surgical History:  Procedure Laterality Date   ANAL FISSURE REPAIR  12-09-2000 and 1970's   CARDIAC CATHETERIZATION  04/09/2000   patent grafts   CARDIAC PACEMAKER PLACEMENT  11-15-1993   dr Cyndia Bent   CARDIOVASCULAR STRESS TEST  07-28-2015  dr crouitoru   Intermediate risk nuclear study w/ large, severe, predominantly fixed inferior and apical  defect; minimal reversibility inferior lateral wall; findings consistent w/ large prior infarct and trivial peri-infarct ischemia;  ef 43% w/ inferior hypokinesis and apical akinesis; mild LVE   CORONARY ANGIOPLASTY WITH STENT PLACEMENT  01-04-2006  dr gamble   DES to pSVG to OM1, SVG to OM2 &  IR, and mSVG to RCA dPDA (x2 grafts );  pLAD , pLCFX and  mRCA occluded;  LIMA to LAD mid-distal patent with occlusion beyond LIMA with collateral flow;  ef 30%   CORONARY ARTERY BYPASS GRAFT  09/10/1988   dr Redmond Pulling   "CABG X5" (03/04/2013) LIMA to LAD,SVG to imtermediate OM1 OM II,SVG to PDA   CYSTOSCOPY W/ RETROGRADES Bilateral 04/13/2016   Procedure: CYSTOSCOPY WITH RETROGRADE PYELOGRAM;  Surgeon: Alexis Frock, MD;  Location: [facility];  Service: Urology;  Laterality: Bilateral;   EP IMPLANTABLE DEVICE N/A 11/29/2015   Procedure: Pacemaker Implant;  Surgeon: David Klein, MD;  Location: [place] [facility];  Service: Cardiovascular;  Laterality: N/A;  new  generator and new atrial, vertricular leads;  [employer] Acc[employer]ade   LEFT HEART CATHETERIZATION WITH CORONARY/GRAFT ANGIOGRAM N/A 03/04/2013   Procedure: LEFT HEART CATHETERIZATION WITH Beatrix Fetters;  Surgeon: Troy Sine, MD;  Location: [facility] C[facility] LAB;  Service: Cardiovascular;  Laterality: N/A;   LOWER EXTREMITY ANGIOGRAM N/A 08/31/2013   Procedure: LOWER EXTREMITY ANGIOGRAM;  Surgeon: Lorretta Harp, MD;  Location: [facility] CATH LAB;  Service: Cardiovascular;  Laterality: N/A;   PACEMAKER REPLACEMENT  12/31/2000;  08-04-2008   PERCUTANEOUS CORONARY STENT INTERVENTION (PCI-S)  03/04/2013   Procedure: PERCUTANEOUS CORONARY STENT INTERVENTION (PCI-S);  Surgeon: Troy Sine, MD;  Location: [facility] CATH LAB;  Service: Cardiovascular;;  DES to S-OM for in-stent restenosis   POPLITEAL ARTERY STENT Right 10-21-2013  dr berry   atherectomy, PTA , and IDEV stent x1   POPLITEAL ARTERY STENT Left 07/ 2015  [name]   angioplasty / stent x2   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   SUPERFICIAL LYMPH NODE BIOPSY / EXCISION Left 2011   cervical lymph node   TRANSURETHRAL RESECTION OF BLADDER TUMOR N/A 04/13/2016   Procedure: TRANSURETHRAL RESECTION OF BLADDER TUMOR (TURBT) with clot evacuation;  Surgeon: Alexis Frock, MD;  Location: [facility];  Service: Urology;  Laterality: N/A;   VENOGRAM Left 11/29/2015   Procedure: Venogram;  Surgeon: David Klein, MD;  Location: [place] [facility];  Service: Cardiovascular;  Laterality: Left;    Outpatient Medications Prior to Visit  Medication Sig Dispense Refill   aspirin EC 81 MG tablet Take 81 mg by mouth daily.     cetirizine (ZYRTEC) 10 MG tablet Take 10 mg by mouth every evening.     cilostazol (PLETAL) 100 MG tablet TAKE 1 TABLET BY MOUTH  TWICE DAILY 180 tablet 3   icosapent Ethyl (VASCEPA) 1 g capsule TAKE 2 CAPSULES BY MOUTH  TWICE DAILY 360 capsule 1   isosorbide mononitrate (IMDUR) 30 MG  24 hr tablet Take one tablet, 30 mg, in the morning and 60 mg, 2 tablets, in the evening. 90 tablet 5   lisinopril (ZESTRIL) 20 MG tablet Take 1 tablet (20 mg total) by mouth daily. 90 tablet 3   metoprolol tartrate (LOPRESSOR) 50 MG tablet TAKE 1 AND 1/2 TABLETS BY  MOUTH TWICE DAILY 270 tablet 3   Multiple Vitamins-Minerals (SENIOR MULTIVITAMIN PLUS PO) Take 1 tablet by mouth daily.     rosuvastatin (CRESTOR) 40 MG tablet TAKE  1 TABLET BY MOUTH AT  BEDTIME 90 tablet 3   No facility-administered medications prior to visit.     Allergies:   Azithromycin, Lactose intolerance (gi), Niacin and related, and Sulfa antibiotics   Social History   Socioeconomic History   Marital status: Married    Spouse name: Not on file   Number of children: Not on file   Years of education: Not on file   Highest education level: Not on file  Occupational History   Not on file  Tobacco Use   Smoking status: Former    Packs/day: 0.12    Years: 1.00    Pack years: 0.12    Types: Cigarettes    Quit date: 08/06/1956    Years since quitting: 64.7   Smokeless tobacco: Never  Substance and Sexual Activity   Alcohol use: No   Drug use: No   Sexual activity: Never  Other Topics Concern   Not on file  Social History Narrative   Not on file   Social Determinants of Health   Financial Resource Strain: Not on file  Food Insecurity: Not on file  Transportation Needs: Not on file  Physical Activity: Not on file  Stress: Not on file  Social Connections: Not on file     Family History:  The patient's family history includes Diabetes in his mother; Heart attack in his father; Hyperlipidemia in his sister and sister; Hypertension in his sister and sister; Stroke in his mother.   ROS:   Please see the history of present illness.   All other systems are reviewed and are negative.   PHYSICAL EXAM:   VS:  BP 124/60 (BP Location: Left Arm, Patient Position: Sitting, Cuff Size: Normal)   Pulse 79   Ht 5' 9.5"  (1.765 m)   Wt 200 lb (90.7 kg)   SpO2 97%   BMI 29.11 kg/m       General: Alert, oriented x3, no distress, overweight, but appears fit and younger than his stated age.  Healthy left subclavian pacemaker site Head: no evidence of trauma, PERRL, EOMI, no exophtalmos or lid lag, no myxedema, no xanthelasma; normal ears, nose and oropharynx Neck: normal jugular venous pulsations and no hepatojugular reflux; brisk carotid pulses without delay and no carotid bruits Chest: clear to auscultation, no signs of consolidation by percussion or palpation, normal fremitus, symmetrical and full respiratory excursions Cardiovascular: normal position and quality of the apical impulse, regular rhythm, normal first and paradoxically split second heart sounds, no murmurs, rubs or gallops Abdomen: no tenderness or distention, no masses by palpation, no abnormal pulsatility or arterial bruits, normal bowel sounds, no hepatosplenomegaly Extremities: no clubbing, cyanosis or edema; 2+ radial, ulnar and brachial pulses bilaterally; thready pulses in his left lower extremity. Neurological: grossly nonfocal Psych: Normal mood and affect     Wt Readings from Last 3 Encounters:  04/17/21 200 lb (90.7 kg)  02/14/21 199 lb 12.8 oz (90.6 kg)  01/26/21 199 lb 8 oz (90.5 kg)    Studies/Labs Reviewed:  Pharmacological nuclear stress test 01/18/2021 The left ventricular ejection fraction is moderately decreased (30-44%). Nuclear stress EF: 36%. There was no ST segment deviation noted during stress. Defect 1: There is a medium defect of severe severity present in the basal inferior, mid inferior, apical inferior, apical lateral and apex location. Findings consistent with prior myocardial infarction. This is an intermediate risk study due to reduced systolic function. There is no ischemia.   EKG:  EKG is not ordered today.  Most recent tracing shows AV sequential pacing and a single PVC Recent Labs: No results found  for requested labs within last 8760 hours.   Lipid Panel  07/15/2020 Cholesterol 132, HDL 41, LDL 67, triglycerides 136, creatinine 1.38, potassium 4.5, normal liver function tests     Component Value Date/Time   CHOL 119 06/24/2014 0855   TRIG 185 (H) 06/24/2014 0855   HDL 35 (L) 06/24/2014 0855   CHOLHDL 3.4 06/24/2014 0855   VLDL 37 06/24/2014 0855   LDLCALC 47 06/24/2014 0855   ASSESSMENT:    1. Coronary artery disease of autologous vein bypass graft with stable angina pectoris ([place])   2. Complete heart block- [facility] pacemaker '95 with Gen change '02, '09. New leads 2017. Pacer dependednt   3. PAD (peripheral artery disease) ([place])   4. Pacemaker   5. Cardiomyopathy, ischemic- EF 40-45% by echo 4/14   6. NSVT (nonsustained ventricular tachycardia) (HCC)   7. Essential hypertension   8. Stenosis of carotid artery, unspecified laterality   9. Dyslipidemia   10. History of lymphoma       PLAN:  In order of problems listed above:  1. CAD s/p CABG 1995, s/p stent SVG-OM 2007 and July 2014: Continues to have exertional angina with greater than usual level of activity.  The nuclear stress test from 01/18/2021 showed scar of previous inferior and apical infarction, without reversible ischemia.  He did not feel the amlodipine was beneficial and has stopped it.  Discussed additional options for treatment including increasing the dose of nitrates, adding clonidine, or proceeding to cardiac catheterization and percutaneous revascularization.  He does not feel limited by his current anginal complaints and would rather stay the course with conservative therapy.  We discussed the distinction between stable and unstable angina in a lot of detail today.  Asked him to call promptly for medical attention if he develops accelerating angina, with more frequent events, low threshold for return occurrence, increased need for sublingual nitroglycerin, prolonged episodes that do not promptly relieved with  rest.  He should seek emergency medical attention for chest pain at rest that does not abate after 30 minutes or 3 consecutive sublingual nitroglycerin tablets.   Unlikely to obtain additional benefit from beta-blockade since he has complete heart block and his heart rate is pacemaker determined. is last stress test was in 2016. 2. CHB: Confirmed today that he does not have a ventricular escape rhythm.  He is pacemaker dependent. 3. PAD: Does not have any complaints of claudication today.  Last year his complaints were more suggestive of neurogenic claudication than vascular disease.  Lower extremity arterial Dopplers performed in July 2021 showed ABI of 0.86 on the right and 0.81 on the left, essentially unchanged from the year before. 4. Pacemaker: Normal device function.  He had new leads implanted in the new generator in 2017, but the old unipolar leads are still in place and he cannot have an MRI scan, unfortunately.   Continue remote downloads every 3 months. 5. Cardiomyopathy, ischemic - EF 43%: The nuclear stress test suggest a slight reduction in LVEF to 36%.  He has never required diuretics and has never had clinical manifestations of heart failure.  Appears clinically euvolemic today.  We have discussed the option to augment therapy for LV dysfunction by switching to [facility], but he prefers to remain on the same medication. 6.  PVCs/NSVT: Stable PVC burden at roughly 4%.  In the past used to have nonsustained VT but this has not  been seen in quite a while. 7. HTN: Well-controlled. 8. Asymptomatic stenosis of right carotid artery without infarction: stable moderate stenosis by repeat carotid duplex ultrasound in February 2022.  No neurological complaints to suggest ischemic events. 9. HLP: LDL within target range.  Borderline low HDL, which has been a chronic problem and has not improved despite reduction in weight. 10. Lymphoma in remission  Medication Adjustments/Labs and Tests  Ordered: Current medicines are reviewed at length with the patient today.  Concerns regarding medicines are outlined above.  Medication changes, Labs and Tests ordered today are listed in the Patient Instructions below. Patient Instructions  Medication Instructions:  No changes *If you need a refill on your cardiac medications before your next appointment, please call your pharmacy*   Lab Work: None ordered If you have labs (blood work) drawn today and your tests are completely normal, you will receive your results only by: [place] (if you have MyChart) OR A paper copy in the mail If you have any lab test that is abnormal or we need to change your treatment, we will call you to review the results.   Testing/Procedures: None ordered   Follow-Up: At [facility], you and your health needs are our priority.  As part of our continuing mission to provide you with exceptional heart care, we have created designated Provider Care Teams.  These Care Teams include your primary Cardiologist (physician) and Advanced Practice Providers (APPs -  Physician Assistants and Nurse Practitioners) who all work together to provide you with the care you need, when you need it.  We recommend signing up for the patient portal called "MyChart".  Sign up information is provided on this After Visit Summary.  MyChart is used to connect with patients for Virtual Visits (Telemedicine).  Patients are able to view lab/test results, encounter notes, upcoming appointments, etc.  Non-urgent messages can be sent to your provider as well.   To learn more about what you can do with MyChart, go to [email or web address].    Your next appointment:   6 month(s)  The format for your next appointment:   In Person  Provider:   Sanda Klein, MD       Signed, David Klein, MD  04/18/2021 1:59 PM    [facility] [place], [place], [place]  [address] Phone: [phone]; Fax:  [phone]

## 2021-04-17 NOTE — Patient Instructions (Signed)

## 2021-04-18 DIAGNOSIS — H40053 Ocular hypertension, bilateral: Secondary | ICD-10-CM | POA: Diagnosis not present

## 2021-04-18 DIAGNOSIS — Z23 Encounter for immunization: Secondary | ICD-10-CM | POA: Diagnosis not present

## 2021-04-18 DIAGNOSIS — H524 Presbyopia: Secondary | ICD-10-CM | POA: Diagnosis not present

## 2021-04-18 DIAGNOSIS — Z961 Presence of intraocular lens: Secondary | ICD-10-CM | POA: Diagnosis not present

## 2021-04-18 NOTE — Progress Notes (Signed)
Remote pacemaker transmission.   

## 2021-04-25 ENCOUNTER — Other Ambulatory Visit: Payer: Self-pay

## 2021-05-25 DIAGNOSIS — H40053 Ocular hypertension, bilateral: Secondary | ICD-10-CM | POA: Diagnosis not present

## 2021-06-12 DIAGNOSIS — C672 Malignant neoplasm of lateral wall of bladder: Secondary | ICD-10-CM | POA: Diagnosis not present

## 2021-06-20 ENCOUNTER — Other Ambulatory Visit: Payer: Self-pay | Admitting: Cardiovascular Disease

## 2021-06-25 ENCOUNTER — Encounter (HOSPITAL_COMMUNITY): Payer: Self-pay | Admitting: *Deleted

## 2021-06-25 ENCOUNTER — Ambulatory Visit (INDEPENDENT_AMBULATORY_CARE_PROVIDER_SITE_OTHER): Payer: Medicare Other

## 2021-06-25 ENCOUNTER — Other Ambulatory Visit: Payer: Self-pay

## 2021-06-25 ENCOUNTER — Ambulatory Visit (HOSPITAL_COMMUNITY)
Admission: EM | Admit: 2021-06-25 | Discharge: 2021-06-25 | Disposition: A | Discharge status: Home / Self Care | Payer: Medicare Other | Attending: Emergency Medicine | Admitting: Emergency Medicine

## 2021-06-25 DIAGNOSIS — Z8572 Personal history of non-Hodgkin lymphomas: Secondary | ICD-10-CM | POA: Insufficient documentation

## 2021-06-25 DIAGNOSIS — I442 Atrioventricular block, complete: Secondary | ICD-10-CM | POA: Diagnosis not present

## 2021-06-25 DIAGNOSIS — J101 Influenza due to other identified influenza virus with other respiratory manifestations: Secondary | ICD-10-CM | POA: Diagnosis not present

## 2021-06-25 DIAGNOSIS — I739 Peripheral vascular disease, unspecified: Secondary | ICD-10-CM | POA: Insufficient documentation

## 2021-06-25 DIAGNOSIS — R509 Fever, unspecified: Secondary | ICD-10-CM | POA: Diagnosis not present

## 2021-06-25 DIAGNOSIS — I255 Ischemic cardiomyopathy: Secondary | ICD-10-CM | POA: Diagnosis not present

## 2021-06-25 DIAGNOSIS — I25110 Atherosclerotic heart disease of native coronary artery with unstable angina pectoris: Secondary | ICD-10-CM | POA: Insufficient documentation

## 2021-06-25 DIAGNOSIS — I119 Hypertensive heart disease without heart failure: Secondary | ICD-10-CM | POA: Diagnosis not present

## 2021-06-25 DIAGNOSIS — R059 Cough, unspecified: Secondary | ICD-10-CM

## 2021-06-25 DIAGNOSIS — Z20822 Contact with and (suspected) exposure to covid-19: Secondary | ICD-10-CM | POA: Diagnosis not present

## 2021-06-25 DIAGNOSIS — I252 Old myocardial infarction: Secondary | ICD-10-CM | POA: Diagnosis not present

## 2021-06-25 DIAGNOSIS — Z950 Presence of cardiac pacemaker: Secondary | ICD-10-CM | POA: Diagnosis not present

## 2021-06-25 LAB — POC INFLUENZA A AND B ANTIGEN (URGENT CARE ONLY)
INFLUENZA A ANTIGEN, POC: POSITIVE — AB
INFLUENZA B ANTIGEN, POC: NEGATIVE

## 2021-06-25 MED ORDER — PREDNISONE 50 MG PO TABS
50.0000 mg | ORAL_TABLET | Freq: Every day | ORAL | 0 refills | Status: DC
Start: 2021-06-25 — End: 2021-07-26

## 2021-06-25 MED ORDER — BENZONATATE 100 MG PO CAPS: 100.0000 mg | ORAL_CAPSULE | Freq: Three times a day (TID) | ORAL | 0 refills | Status: DC

## 2021-06-25 MED ORDER — PREDNISONE 50 MG PO TABS: 50.0000 mg | ORAL_TABLET | Freq: Every day | ORAL | 0 refills | Status: DC

## 2021-06-25 NOTE — ED Triage Notes (Signed)
Sx's started on Friday with sore throat ,HA. Pt has worsen cough ,congestion and cough.

## 2021-06-25 NOTE — ED Provider Notes (Signed)
[name]  ____________________________________________  Time seen: Approximately 11:05 AM  I have reviewed the triage vital signs and the nursing notes.   HISTORY  Chief Complaint Sore Throat, Headache, Cough, and Nasal Congestion    HPI David [name] is a 87 y.o. male who presents the emergency department complaining of congestion, sore throat, cough.  Symptoms began 2 days ago.  Primarily started with nasal congestion sore throat and developed a cough yesterday.  Patient states that the cough is his primary concern that kept him up most of the night.  It is slightly productive.  Patient has an extensive medical history to include coronary artery disease, complete heart block with pacemaker placement, MI, hypertension, ischemic cardiomyopathy remote history of non-Hodgkin's lymphoma, peripheral vascular disease.  Patient denies any chest pain, difficulty breathing.  No GI complaints.  No urinary symptoms.       Past Medical History:  Diagnosis Date   Arthritis    "hands" (10/19/2013)   Bilateral carotid bruits 04/08/2020   Right greater than left   Bladder tumor    CAD (coronary artery disease)    cardiologist-  dr crouitoru   Carotid artery disease (HCC)    moderate pRICA 56-43%,  3-29% LICA , >51% RECA last duplex 08-05-2015   Cataract of both eyes    Complete heart block (HCC)    GERD (gastroesophageal reflux disease)    Glaucoma, both eyes    Hematuria    History of MI (myocardial infarction)    1989   Hyperlipidemia    Hypertension    Ischemic cardiomyopathy    ef 40-45%  without clinical heart failure per cardiology note 12-28-2015 (nuclear stress ef 43% from 07-28-2015)   Left ventricular dysfunction    Non Hodgkin's lymphoma ([place]) dx 06/2010---  oncologist-  dr Benay Spice---  in clinical remission since 2013   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 -- lymphadenopathy to neck, mediastinal , and abdominal--  chemotherapy completed  06/ 2011   NSVT (nonsustained ventricular tachycardia) cardiologist-  dr crouitoru   Max rate 200 bpm, 28-beat, nearly 9-second episode recorded by pacemaker on November 15, asymptomatic    Pacemaker pacemaker dependant due to Bee   1st Pacemaker Placement 1998 w/ New generator  12-31-2000 ; 08-04-2008;  11-29-2015   PAD (peripheral artery disease) ([place])    followed by dr berry--- last duplex 08-05-2015  patents bilateral popliteal stents, bilateral SFA 30-49% stenosis   Peripheral vascular disease with claudication ([place])    dx 2009 left popliteal occlusion with collaterals; 03- 2015  s/p  stenting right popliteal  by dr berry and left popliteal stenting by dr Andree Elk ([facility]) july 2015   S/P CABG x 5    09-10-1988   S/P drug eluting coronary stent placement 01/04/2006   01-04-2006--- DES to SVG to OM1, OM2, and IR, and SVG to PDA(x2 grafts)  and 03-04-2013  DES to SVR to OM   Wears glasses     Patient Active Problem List   Diagnosis Date Noted   Bilateral carotid bruits 04/08/2020   Acute pain of right shoulder 06/11/2018   CHB (complete heart block) ([place]) 11/29/2015   Pacemaker battery depletion 11/28/2015   Pacemaker lead malfunction 11/28/2015   Pacemaker 10/21/2015   NSVT (nonsustained ventricular tachycardia) 06/24/2014   Pseudoaneurysm following procedure ([place]) 10/22/2013   Claudication in peripheral vascular disease ([place]) 10/19/2013   Hematoma of groin -right sided; w/p PV angio 09/11/2013   Claudication- unsuccessful PTA  attempt Lt popliteal 08/31/13 08/31/2013   Asymptomatic stenosis of right carotid artery without infarction 06/14/2013   Unstable angina ([place]) 03/05/2013   CAD s/p CABG 1995, s/p stent SVG-OM 2007 and July 2014 03/05/2013   S/P angioplasty with stent SVG-OM 7/30./14 03/05/2013   Complete heart block- [facility] pacemaker '95 with Gen change '02, '09. New leads 2017. Pacer dependednt 03/05/2013   PVD - Lt popliteal occlusion with collaterals '09; rt popliteal  successful diamondback orbital rotational atherectomy PTA and stent using an IDEV stent 10/21/13 03/05/2013   Non Hodgkin's lymphoma- Feb 2011- radiation Rx 03/05/2013   Dyslipidemia 03/05/2013   HTN (hypertension) 03/05/2013   Cardiomyopathy, ischemic- EF 40-45% by echo 4/14 03/05/2013   Abnormal nuclear cardiac imaging test- high risk Myoview 02/13/13 03/05/2013    Past Surgical History:  Procedure Laterality Date   ANAL FISSURE REPAIR  12-09-2000 and 1970's   CARDIAC CATHETERIZATION  04/09/2000   patent grafts   CARDIAC PACEMAKER PLACEMENT  11-15-1993   dr Cyndia Bent   CARDIOVASCULAR STRESS TEST  07-28-2015  dr crouitoru   Intermediate risk nuclear study w/ large, severe, predominantly fixed inferior and apical defect; minimal reversibility inferior lateral wall; findings consistent w/ large prior infarct and trivial peri-infarct ischemia;  ef 43% w/ inferior hypokinesis and apical akinesis; mild LVE   CORONARY ANGIOPLASTY WITH STENT PLACEMENT  01-04-2006  dr gamble   DES to pSVG to OM1, SVG to OM2 &  IR, and mSVG to RCA dPDA (x2 grafts );  pLAD , pLCFX and  mRCA occluded;  LIMA to LAD mid-distal patent with occlusion beyond LIMA with collateral flow;  ef 30%   CORONARY ARTERY BYPASS GRAFT  09/10/1988   dr Redmond Pulling   "CABG X5" (03/04/2013) LIMA to LAD,SVG to imtermediate OM1 OM II,SVG to PDA   CYSTOSCOPY W/ RETROGRADES Bilateral 04/13/2016   Procedure: CYSTOSCOPY WITH RETROGRADE PYELOGRAM;  Surgeon: Alexis Frock, MD;  Location: [facility];  Service: Urology;  Laterality: Bilateral;   EP IMPLANTABLE DEVICE N/A 11/29/2015   Procedure: Pacemaker Implant;  Surgeon: Sanda Klein, MD;  Location: [place] [facility];  Service: Cardiovascular;  Laterality: N/A;  new generator and new atrial, vertricular leads;  [employer] Accolade   LEFT HEART CATHETERIZATION WITH CORONARY/GRAFT ANGIOGRAM N/A 03/04/2013   Procedure: LEFT HEART CATHETERIZATION WITH Beatrix Fetters;  Surgeon:  Troy Sine, MD;  Location: [facility] CATH LAB;  Service: Cardiovascular;  Laterality: N/A;   LOWER EXTREMITY ANGIOGRAM N/A 08/31/2013   Procedure: LOWER EXTREMITY ANGIOGRAM;  Surgeon: Lorretta Harp, MD;  Location[facility] C[facility] LAB;  Service: Cardiovascular;  Laterality: N/A;   PACEMAKER REPLACEMENT  12/31/2000;  08-04-2008   PERCUTANEOUS CORONARY STENT INTERVENTION (PCI-S)  03/04/2013   Procedure: PERCUTANEOUS CORONARY STENT INTERVENTION (PCI-S);  Surgeon: Troy Sine, MD;  Location: [facility] CATH LAB;  Service: Cardiovascular;;  DES to S-OM for in-stent restenosis   POPLITEAL ARTERY STENT Right 10-21-2013  dr berry   atherectomy, PTA , and IDEV stent x1   POPLITEAL ARTERY STENT Left 07/ 2015  dr Andree Elk at [facility]   angioplasty / stent x2   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   SUPERFICIAL LYMPH NODE BIOPSY / EXCISION Left 2011   cervical lymph node   TRANSURETHRAL RESECTION OF BLADDER TUMOR N/A 04/13/2016   Procedure: TRANSURETHRAL RESECTION OF BLADDER TUMOR (TURBT) with clot evacuation;  Surgeon: Alexis Frock, MD;  Location: [facility];  Service: Urology;  Laterality: N/A;   VENOGRAM Left 11/29/2015  Procedure: Venogram;  Surgeon: Sanda Klein, MD;  Location: [place] [facility];  Service: Cardiovascular;  Laterality: Left;    Prior to Admission medications   Medication Sig Start Date End Date Taking? Authorizing Provider  aspirin EC 81 MG tablet Take 81 mg by mouth daily.    [provider]  cetirizine (ZYRTEC) 10 MG tablet Take 10 mg by mouth every evening.    [provider]  cilostazol (PLETAL) 100 MG tablet TAKE 1 TABLET BY MOUTH  TWICE DAILY 02/28/21   Croitoru, Mihai, MD  icosapent Ethyl (VASCEPA) 1 g capsule TAKE 2 CAPSULES BY MOUTH  TWICE DAILY 06/20/21   Croitoru, Dani Gobble, MD  isosorbide mononitrate (IMDUR) 30 MG 24 hr tablet Take one tablet, 30 mg, in the morning and 60 mg, 2 tablets, in the evening. 01/09/21   Croitoru, Mihai, MD   lisinopril (ZESTRIL) 20 MG tablet Take 1 tablet (20 mg total) by mouth daily. 01/26/21 04/26/21  Warren Lacy, PA-C  metoprolol tartrate (LOPRESSOR) 50 MG tablet TAKE 1 AND 1/2 TABLETS BY  MOUTH TWICE DAILY 03/28/21   Croitoru, Mihai, MD  Multiple Vitamins-Minerals (SENIOR MULTIVITAMIN PLUS PO) Take 1 tablet by mouth daily.    [provider]  rosuvastatin (CRESTOR) 40 MG tablet TAKE 1 TABLET BY MOUTH AT  BEDTIME 06/20/21   Croitoru, Mihai, MD    Allergies Azithromycin, Lactose intolerance (gi), Niacin and related, and Sulfa antibiotics  Family History  Problem Relation Age of Onset   Diabetes Mother    Stroke Mother    Heart attack Father    Hypertension Sister    Hyperlipidemia Sister    Hypertension Sister    Hyperlipidemia Sister     Social History Social History   Tobacco Use   Smoking status: Former    Packs/day: 0.12    Years: 1.00    Pack years: 0.12    Types: Cigarettes    Quit date: 08/06/1956    Years since quitting: 64.9   Smokeless tobacco: Never  Substance Use Topics   Alcohol use: No   Drug use: No     Review of Systems  Constitutional: No fever/chills Eyes: No visual changes. No discharge ENT: Positive for nasal congestion and sore throat Cardiovascular: no chest pain. Respiratory: Positive cough. No SOB. Gastrointestinal: No abdominal pain.  No nausea, no vomiting.  No diarrhea.  No constipation. Musculoskeletal: Negative for musculoskeletal pain. Skin: Negative for rash, abrasions, lacerations, ecchymosis. Neurological: Negative for headaches, focal weakness or numbness.  10 System ROS otherwise negative.  ____________________________________________   PHYSICAL EXAM:  VITAL SIGNS: ED Triage Vitals  Enc Vitals Group     BP 06/25/21 1038 (!) 142/76     Pulse Rate 06/25/21 1038 61     Resp 06/25/21 1038 20     Temp 06/25/21 1038 97.6 F (36.4 C)     Temp src --      SpO2 06/25/21 1038 98 %     Weight --      Height --       Head Circumference --      Peak Flow --      Pain Score 06/25/21 1036 0     Pain Loc --      Pain Edu? --      Excl. in [place]? --      Constitutional: Alert and oriented. Well appearing and in no acute distress. Eyes: Conjunctivae are normal. PERRL. EOMI. Head: Atraumatic. ENT:      Ears:  Nose: No congestion/rhinnorhea.      Mouth/Throat: Mucous membranes are moist.  No gross oropharyngeal erythema or edema.  Uvula is midline.  No appreciable voice changes Neck: No stridor.  Neck is supple full range of motion.  No tenderness Hematological/Lymphatic/Immunilogical: No cervical lymphadenopathy. Cardiovascular: Normal rate, regular rhythm. Normal S1 and S2.  Good peripheral circulation. Respiratory: Normal respiratory effort without tachypnea or retractions. Lungs with a few crackles on the right side, no wheezing.Kermit Balo air entry to the bases with no decreased or absent breath sounds. Gastrointestinal: Bowel sounds 4 quadrants. Soft and nontender to palpation. No guarding or rigidity. No palpable masses. No distention. No CVA tenderness. Musculoskeletal: Full range of motion to all extremities. No gross deformities appreciated. Neurologic:  Normal speech and language. No gross focal neurologic deficits are appreciated.  Skin:  Skin is warm, dry and intact. No rash noted. Psychiatric: Mood and affect are normal. Speech and behavior are normal. Patient exhibits appropriate insight and judgement.   ____________________________________________   LABS (all labs ordered are listed, but only abnormal results are displayed)  Labs Reviewed  POC INFLUENZA A AND B ANTIGEN (URGENT CARE ONLY) - Abnormal; Notable for the following components:      Result Value   INFLUENZA A ANTIGEN, POC POSITIVE (*)    All other components within normal limits  SARS CORONAVIRUS 2 (TAT 6-24 HRS)  POC SARS CORONAVIRUS 2 AG -  ED    ____________________________________________  EKG   ____________________________________________  RADIOLOGY I personally viewed and evaluated these images as part of my medical decision making, as well as reviewing the written report by the radiologist.  ED Provider Interpretation: No consolidation concerning for pneumonia  DG Chest 2 View  Result Date: 06/25/2021 CLINICAL DATA:  Fever and cough 3 days. EXAM: CHEST - 2 VIEW COMPARISON:  07/25/2019 FINDINGS: Sternotomy wires and left-sided pacemaker unchanged. Lungs are somewhat hypoinflated without focal airspace consolidation or effusion. Cardiomediastinal silhouette and remainder of the exam is unchanged. IMPRESSION: Hypoinflation without acute cardiopulmonary disease. Electronically Signed   By: Marin Olp M.D.   On: 06/25/2021 11:42    ____________________________________________    PROCEDURES  Procedure(s) performed:    Procedures    Medications - No data to display   ____________________________________________   INITIAL IMPRESSION / ASSESSMENT AND PLAN / ED COURSE  Pertinent labs & imaging results that were available during my care of the patient were reviewed by me and considered in my medical decision making (see chart for details).  Review of the  [place]RS was performed in accordance of the [place] prior to dispensing any controlled drugs.           Patient's diagnosis is consistent with influenza A.  Patient presents with 3 days of URI type symptoms.  Complex medical history but patient denied any chest pain, shortness of breath.  He has had no fever either.  Differential included viral illness versus pneumonia.  Chest x-ray was reassuring with no consolidation concerning for pneumonia.  Positive for influenza on testing.  At this time we discussed Tamiflu, patient is 72 hours into his symptoms, discussed the side effects and patient declines Tamiflu which I feel is reasonable.  I will prescribe short  course of steroid, Tessalon Perles.  Tylenol and Motrin at home, plenty fluids, plenty of rest.  I discussed at length his increased risk given his complex medical history for complication and if he has any worsening symptoms he should be evaluated in the emergency department..  Patient is  given ED precautions to return to the ED for any worsening or new symptoms.     ____________________________________________  FINAL CLINICAL IMPRESSION(S) / DIAGNOSES  Final diagnoses:  Influenza A      NEW MEDICATIONS STARTED DURING THIS VISIT:  ED Discharge Orders          Ordered    Novel Coronavirus, NAA (Hosp order, Send-out to Ref Lab; TAT 18-24 hrs        Pending                This chart was dictated using voice recognition software/Dragon. Despite best efforts to proofread, errors can occur which can change the meaning. Any change was purely unintentional.    Darletta Moll, PA-C 06/25/21 1250

## 2021-06-26 LAB — SARS CORONAVIRUS 2 (TAT 6-24 HRS): SARS Coronavirus 2: NEGATIVE

## 2021-07-04 ENCOUNTER — Ambulatory Visit (INDEPENDENT_AMBULATORY_CARE_PROVIDER_SITE_OTHER): Payer: Medicare Other

## 2021-07-04 DIAGNOSIS — I255 Ischemic cardiomyopathy: Secondary | ICD-10-CM | POA: Diagnosis not present

## 2021-07-04 LAB — CUP PACEART REMOTE DEVICE CHECK
Battery Remaining Longevity: 90 mo
Battery Remaining Percentage: 88 %
Brady Statistic RA Percent Paced: 68 %
Brady Statistic RV Percent Paced: 97 %
Date Time Interrogation Session: 20221129022100
Implantable Lead Implant Date: 20170425
Implantable Lead Implant Date: 20170425
Implantable Lead Location: 753859
Implantable Lead Location: 753860
Implantable Lead Model: 7741
Implantable Lead Model: 7742
Implantable Lead Serial Number: 720495
Implantable Lead Serial Number: 763888
Implantable Pulse Generator Implant Date: 20170425
Lead Channel Impedance Value: 677 Ohm
Lead Channel Impedance Value: 838 Ohm
Lead Channel Pacing Threshold Amplitude: 0.7 V
Lead Channel Pacing Threshold Pulse Width: 0.4 ms
Lead Channel Setting Pacing Amplitude: 1.2 V
Lead Channel Setting Pacing Amplitude: 2.5 V
Lead Channel Setting Pacing Pulse Width: 0.4 ms
Lead Channel Setting Sensing Sensitivity: 2.5 mV
Pulse Gen Serial Number: 718025

## 2021-07-13 NOTE — Progress Notes (Signed)
Remote pacemaker transmission.   

## 2021-07-15 ENCOUNTER — Emergency Department (HOSPITAL_COMMUNITY)
Admission: EM | Admit: 2021-07-15 | Discharge: 2021-07-15 | Disposition: A | Discharge status: Home / Self Care | Payer: Medicare Other | Attending: Emergency Medicine | Admitting: Emergency Medicine

## 2021-07-15 ENCOUNTER — Encounter (HOSPITAL_COMMUNITY): Payer: Self-pay | Admitting: Emergency Medicine

## 2021-07-15 ENCOUNTER — Emergency Department (HOSPITAL_COMMUNITY): Payer: Medicare Other

## 2021-07-15 ENCOUNTER — Other Ambulatory Visit: Payer: Self-pay

## 2021-07-15 DIAGNOSIS — J9811 Atelectasis: Secondary | ICD-10-CM | POA: Diagnosis not present

## 2021-07-15 DIAGNOSIS — Z950 Presence of cardiac pacemaker: Secondary | ICD-10-CM | POA: Diagnosis not present

## 2021-07-15 DIAGNOSIS — I2510 Atherosclerotic heart disease of native coronary artery without angina pectoris: Secondary | ICD-10-CM | POA: Diagnosis not present

## 2021-07-15 DIAGNOSIS — R197 Diarrhea, unspecified: Secondary | ICD-10-CM | POA: Diagnosis not present

## 2021-07-15 DIAGNOSIS — Z955 Presence of coronary angioplasty implant and graft: Secondary | ICD-10-CM | POA: Insufficient documentation

## 2021-07-15 DIAGNOSIS — I10 Essential (primary) hypertension: Secondary | ICD-10-CM | POA: Insufficient documentation

## 2021-07-15 DIAGNOSIS — Z79899 Other long term (current) drug therapy: Secondary | ICD-10-CM | POA: Diagnosis not present

## 2021-07-15 DIAGNOSIS — Z87891 Personal history of nicotine dependence: Secondary | ICD-10-CM | POA: Insufficient documentation

## 2021-07-15 DIAGNOSIS — Z7982 Long term (current) use of aspirin: Secondary | ICD-10-CM | POA: Diagnosis not present

## 2021-07-15 DIAGNOSIS — R000 Tachycardia, unspecified: Secondary | ICD-10-CM | POA: Diagnosis not present

## 2021-07-15 LAB — CBC WITH DIFFERENTIAL/PLATELET
Abs Immature Granulocytes: 0.02 10*3/uL (ref 0.00–0.07)
Basophils Absolute: 0 10*3/uL (ref 0.0–0.1)
Basophils Relative: 0 %
Eosinophils Absolute: 0.1 10*3/uL (ref 0.0–0.5)
Eosinophils Relative: 2 %
HCT: 41.5 % (ref 39.0–52.0)
Hemoglobin: 13.5 g/dL (ref 13.0–17.0)
Immature Granulocytes: 0 %
Lymphocytes Relative: 17 %
Lymphs Abs: 1.2 10*3/uL (ref 0.7–4.0)
MCH: 31 pg (ref 26.0–34.0)
MCHC: 32.5 g/dL (ref 30.0–36.0)
MCV: 95.4 fL (ref 80.0–100.0)
Monocytes Absolute: 0.6 10*3/uL (ref 0.1–1.0)
Monocytes Relative: 9 %
Neutro Abs: 5 10*3/uL (ref 1.7–7.7)
Neutrophils Relative %: 72 %
Platelets: 197 10*3/uL (ref 150–400)
RBC: 4.35 MIL/uL (ref 4.22–5.81)
RDW: 12.8 % (ref 11.5–15.5)
WBC: 6.9 10*3/uL (ref 4.0–10.5)
nRBC: 0 % (ref 0.0–0.2)

## 2021-07-15 LAB — COMPREHENSIVE METABOLIC PANEL
ALT: 34 U/L (ref 0–44)
AST: 30 U/L (ref 15–41)
Albumin: 4.1 g/dL (ref 3.5–5.0)
Alkaline Phosphatase: 46 U/L (ref 38–126)
Anion gap: 7 (ref 5–15)
BUN: 18 mg/dL (ref 8–23)
CO2: 22 mmol/L (ref 22–32)
Calcium: 8.8 mg/dL — ABNORMAL LOW (ref 8.9–10.3)
Chloride: 106 mmol/L (ref 98–111)
Creatinine, Ser: 1.22 mg/dL (ref 0.61–1.24)
GFR, Estimated: 58 mL/min — ABNORMAL LOW (ref >60)
Glucose, Bld: 106 mg/dL — ABNORMAL HIGH (ref 70–99)
Potassium: 4.6 mmol/L (ref 3.5–5.1)
Sodium: 135 mmol/L (ref 135–145)
Total Bilirubin: 0.9 mg/dL (ref 0.3–1.2)
Total Protein: 6.9 g/dL (ref 6.5–8.1)

## 2021-07-15 LAB — TROPONIN I (HIGH SENSITIVITY): Troponin I (High Sensitivity): 14 ng/L (ref <18)

## 2021-07-15 MED ORDER — LOPERAMIDE HCL 2 MG PO CAPS: 4.0000 mg | ORAL_CAPSULE | Freq: Once | ORAL | Status: DC

## 2021-07-15 NOTE — ED Notes (Signed)
Pt ambulated to front lobby

## 2021-07-15 NOTE — ED Provider Notes (Signed)
Emergency Medicine Provider Triage Evaluation Note  David [name] , a 87 y.o. male  was evaluated in triage.  Pt complains of tachycardia.  He states that he was diagnosed with the flu 2 weeks ago and is still recovering.  He states that he has had ongoing diarrhea as much is 6 times a day.  He states that he has been attempting to eat and drink electrolyte water.  He noted yesterday that his heart rate was in the 90s.  He has a ventricular pacemaker and states that his heart rate is normally never this high.  He states that the low limits on his pacemaker is around 60.  He is unsure of the high limit.  He denies chest pain, palpitations, shortness of breath, lightheadedness or dizziness that prompted.  He is unsure what prompted him to check his pulse.  At this time he is asymptomatic.  He states he is unsure if he is dehydrated and this is why his heart rate is up.  Pacemaker does not have a defibrillator.  Review of Systems  Positive: See above Negative:   Physical Exam  There were no vitals taken for this visit.  Vitals taken after assessment. Gen:   Awake, no distress, alert and oriented. Resp:  Normal effort, lungs clear to auscultation bilaterally MSK:   Moves extremities without difficulty  Other:  S1/S2 without murmur.  Pulses 2+ bilaterally.  Medical Decision Making  Medically screening exam initiated at 11:17 AM.  Appropriate orders placed.  Birdie Sons was informed that the remainder of the evaluation will be completed by another provider, this initial triage assessment does not replace that evaluation, and the importance of remaining in the ED until their evaluation is complete.     Mickie Hillier, PA-C 07/15/21 [place], DO 07/15/21 1450

## 2021-07-15 NOTE — ED Notes (Signed)
Assumed care of pt at this time.  Pt placed on monitor etc.  Pt has no complaints at this time.

## 2021-07-15 NOTE — ED Triage Notes (Signed)
Pt reports flu + 2 weeks ago with diarrhea x 1 week.  States diarrhea resolved for a few days and has returned x 3 days.  Denies pain.  States he checked his HR this morning and it was 93.  He was concerned with it being that high and states he has a pacemaker and it normally stays around 60.  States HR has improved since being in ED.

## 2021-07-15 NOTE — ED Provider Notes (Signed)
[facility] EMERGENCY DEPARTMENT Provider Note   CSN: [id number] Arrival date & time: 07/15/21  9892     History No chief complaint on file.   David [name] is a 87 y.o. male.  87 year old male with history of pacemaker placement presents with concern for elevated pulse rate.  He states he is recovering from influenza, which was diagnosed over 2 weeks ago.  He has experienced persistent diarrhea, but this has been improving.  He has made dietary modifications and is drinking plenty of fluids.  He is no ongoing fevers.  He is not short of breath.  He denies any chest pain, headaches, dizziness, abdominal pain, nausea, vomiting.  He was in the shower earlier this morning, when he checked his pulse.  His pacemaker is set for the low 60s, but he notes that his pulse was in the 90s.  He is not having any symptoms at this time, but wanted to come to the ED for evaluation.  He does have a cardiologist and states that his pacemaker is constantly interrogated and he is alerted with any issues.   The history is provided by the patient and medical records.  Diarrhea Quality:  Watery and semi-solid Severity:  Moderate Onset quality:  Gradual Number of episodes:  6/day Timing:  Intermittent Progression:  Partially resolved Relieved by:  Anti-motility medications and change in diet Associated symptoms: no abdominal pain, no arthralgias, no chills, no fever and no vomiting       Past Medical History:  Diagnosis Date   Arthritis    "hands" (10/19/2013)   Bilateral carotid bruits 04/08/2020   Right greater than left   Bladder tumor    CAD (coronary artery disease)    cardiologist-  dr crouitoru   Carotid artery disease ([place])    moderate pRICA 11-94%,  1-74% LICA , >08% RECA last duplex 08-05-2015   Cataract of both eyes    Complete heart block (HCC)    GERD (gastroesophageal reflux disease)    Glaucoma, both eyes    Hematuria    History of MI (myocardial infarction)     1989   Hyperlipidemia    Hypertension    Ischemic cardiomyopathy    ef 40-45%  without clinical heart failure per cardiology note 12-28-2015 (nuclear stress ef 43% from 07-28-2015)   Left ventricular dysfunction    Non Hodgkin's lymphoma ([place]) dx 06/2010---  oncologist-  dr Benay Spice---  in clinical remission since 2013   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 -- lymphadenopathy to neck, mediastinal , and abdominal--  chemotherapy completed 06/ 2011   NSVT (nonsustained ventricular tachycardia) cardiologist-  dr crouitoru   Max rate 200 bpm, 28-beat, nearly 9-second episode recorded by pacemaker on November 15, asymptomatic    Pacemaker pacemaker dependant due to [place]   1st Pacemaker Placement 1998 w/ New generator  12-31-2000 ; 08-04-2008;  11-29-2015   PAD (peripheral artery disease) ([place])    followed by dr berry--- last duplex 08-05-2015  patents bilateral popliteal stents, bilateral SFA 30-49% stenosis   Peripheral vascular disease with claudication ([place])    dx 2009 left popliteal occlusion with collaterals; 03- 2015  s/p  stenting right popliteal  by dr berry and left popliteal stenting by dr Andree Elk ([facility]) july 2015   S/P CABG x 5    09-10-1988   S/P drug eluting coronary stent placement 01/04/2006   01-04-2006--- DES to SVG to OM1, OM2, and IR, and SVG to PDA(x2 grafts)  and 03-04-2013  DES to SVR to OM   Wears glasses     Patient Active Problem List   Diagnosis Date Noted   Bilateral carotid bruits 04/08/2020   Acute pain of right shoulder 06/11/2018   CHB (complete heart block) ([place]) 11/29/2015   Pacemaker battery depletion 11/28/2015   Pacemaker lead malfunction 11/28/2015   Pacemaker 10/21/2015   NSVT (nonsustained ventricular tachycardia) 06/24/2014   Pseudoaneurysm following procedure ([place]) 10/22/2013   Claudication in peripheral vascular disease ([place]) 10/19/2013   Hematoma of groin -right sided; w/p PV angio 09/11/2013   Claudication-  unsuccessful PTA attempt Lt popliteal 08/31/13 08/31/2013   Asymptomatic stenosis of right carotid artery without infarction 06/14/2013   Unstable angina ([place]) 03/05/2013   CAD s/p CABG 1995, s/p stent SVG-OM 2007 and July 2014 03/05/2013   S/P angioplasty with stent SVG-OM 7/30./14 03/05/2013   Complete heart block- [facility] pacemaker '95 with Gen change '02, '09. New leads 2017. Pacer dependednt 03/05/2013   PVD - Lt popliteal occlusion with collaterals '09; rt popliteal successful diamondback orbital rotational atherectomy PTA and stent using an IDEV stent 10/21/13 03/05/2013   Non Hodgkin's lymphoma- Feb 2011- radiation Rx 03/05/2013   Dyslipidemia 03/05/2013   HTN (hypertension) 03/05/2013   Cardiomyopathy, ischemic- EF 40-45% by echo 4/14 03/05/2013   Abnormal nuclear cardiac imaging test- high risk Myoview 02/13/13 03/05/2013    Past Surgical History:  Procedure Laterality Date   ANAL FISSURE REPAIR  12-09-2000 and 1970's   CARDIAC CATHETERIZATION  04/09/2000   patent grafts   CARDIAC PACEMAKER PLACEMENT  11-15-1993   dr Cyndia Bent   CARDIOVASCULAR STRESS TEST  07-28-2015  dr crouitoru   Intermediate risk nuclear study w/ large, severe, predominantly fixed inferior and apical defect; minimal reversibility inferior lateral wall; findings consistent w/ large prior infarct and trivial peri-infarct ischemia;  ef 43% w/ inferior hypokinesis and apical akinesis; mild LVE   CORONARY ANGIOPLASTY WITH STENT PLACEMENT  01-04-2006  dr gamble   DES to pSVG to OM1, SVG to OM2 &  IR, and mSVG to RCA dPDA (x2 grafts );  pLAD , pLCFX and  mRCA occluded;  LIMA to LAD mid-distal patent with occlusion beyond LIMA with collateral flow;  ef 30%   CORONARY ARTERY BYPASS GRAFT  09/10/1988   dr Redmond Pulling   "CABG X5" (03/04/2013) LIMA to LAD,SVG to imtermediate OM1 OM II,SVG to PDA   CYSTOSCOPY W/ RETROGRADES Bilateral 04/13/2016   Procedure: CYSTOSCOPY WITH RETROGRADE PYELOGRAM;  Surgeon: Alexis Frock, MD;  Location: [facility];  Service: Urology;  Laterality: Bilateral;   EP IMPLANTABLE DEVICE N/A 11/29/2015   Procedure: Pacemaker Implant;  Surgeon: Sanda Klein, MD;  Location: [place] CV LAB;  Service: Cardiovascular;  Laterality: N/A;  new generator and new atrial, vertricular leads;  [employer] Accolade   LEFT HEART CATHETERIZATION WITH CORONARY/GRAFT ANGIOGRAM N/A 03/04/2013   Procedure: LEFT HEART CATHETERIZATION WITH Beatrix Fetters;  Surgeon: Troy Sine, MD;  Location: [facility] CATH LAB;  Service: Cardiovascular;  Laterality: N/A;   LOWER EXTREMITY ANGIOGRAM N/A 08/31/2013   Procedure: LOWER EXTREMITY ANGIOGRAM;  Surgeon: Lorretta Harp, MD;  Location: [facility] C[facility] LAB;  Service: Cardiovascular;  Laterality: N/A;   PACEMAKER REPLACEMENT  12/31/2000;  08-04-2008   PERCUTANEOUS CORONARY STENT INTERVENTION (PCI-S)  03/04/2013   Procedure: PERCUTANEOUS CORONARY STENT INTERVENTION (PCI-S);  Surgeon: Troy Sine, MD;  Location: [facility] CATH LAB;  Service: Cardiovascular;;  DES to S-OM for in-stent restenosis   POPLITEAL ARTERY STENT Right  10-21-2013  dr berry   atherectomy, PTA , and IDEV stent x1   POPLITEAL ARTERY STENT Left 07/ 2015  dr Andree Elk at [facility]   angioplasty / stent x2   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   SUPERFICIAL LYMPH NODE BIOPSY / EXCISION Left 2011   cervical lymph node   TRANSURETHRAL RESECTION OF BLADDER TUMOR N/A 04/13/2016   Procedure: TRANSURETHRAL RESECTION OF BLADDER TUMOR (TURBT) with clot evacuation;  Surgeon: Alexis Frock, MD;  Location: [facility];  Service: Urology;  Laterality: N/A;   VENOGRAM Left 11/29/2015   Procedure: Venogram;  Surgeon: Sanda Klein, MD;  Location: [place] CV LAB;  Service: Cardiovascular;  Laterality: Left;       Family History  Problem Relation Age of Onset   Diabetes Mother    Stroke Mother    Heart attack Father    Hypertension Sister    Hyperlipidemia Sister     Hypertension Sister    Hyperlipidemia Sister     Social History   Tobacco Use   Smoking status: Former    Packs/day: 0.12    Years: 1.00    Pack years: 0.12    Types: Cigarettes    Quit date: 08/06/1956    Years since quitting: 64.9   Smokeless tobacco: Never  Substance Use Topics   Alcohol use: No   Drug use: No    Home Medications Prior to Admission medications   Medication Sig Start Date End Date Taking? Authorizing Provider  aspirin EC 81 MG tablet Take 81 mg by mouth daily.    [provider]  benzonatate (TESSALON) 100 MG capsule Take 1 capsule (100 mg total) by mouth every 8 (eight) hours. 06/25/21   Cuthriell, Charline Bills, PA-C  cetirizine (ZYRTEC) 10 MG tablet Take 10 mg by mouth every evening.    [provider]  cilostazol (PLETAL) 100 MG tablet TAKE 1 TABLET BY MOUTH  TWICE DAILY 02/28/21   Croitoru, Mihai, MD  icosapent Ethyl (VASCEPA) 1 g capsule TAKE 2 CAPSULES BY MOUTH  TWICE DAILY 06/20/21   Croitoru, Dani Gobble, MD  isosorbide mononitrate (IMDUR) 30 MG 24 hr tablet Take one tablet, 30 mg, in the morning and 60 mg, 2 tablets, in the evening. 01/09/21   Croitoru, Mihai, MD  lisinopril (ZESTRIL) 20 MG tablet Take 1 tablet (20 mg total) by mouth daily. 01/26/21 04/26/21  Warren Lacy, PA-C  metoprolol tartrate (LOPRESSOR) 50 MG tablet TAKE 1 AND 1/2 TABLETS BY  MOUTH TWICE DAILY 03/28/21   Croitoru, Mihai, MD  Multiple Vitamins-Minerals (SENIOR MULTIVITAMIN PLUS PO) Take 1 tablet by mouth daily.    [provider]  predniSONE (DELTASONE) 50 MG tablet Take 1 tablet (50 mg total) by mouth daily with breakfast. 06/25/21   Cuthriell, Charline Bills, PA-C  rosuvastatin (CRESTOR) 40 MG tablet TAKE 1 TABLET BY MOUTH AT  BEDTIME 06/20/21   Croitoru, Mihai, MD    Allergies    Azithromycin, Lactose intolerance (gi), Niacin and related, and Sulfa antibiotics  Review of Systems   Review of Systems  Constitutional:  Negative for chills and fever.  HENT:   Negative for ear pain and sore throat.   Eyes:  Negative for pain and visual disturbance.  Respiratory:  Negative for cough and shortness of breath.   Cardiovascular:  Negative for chest pain and palpitations.  Gastrointestinal:  Positive for diarrhea. Negative for abdominal pain and vomiting.  Genitourinary:  Negative for dysuria and hematuria.  Musculoskeletal:  Negative for arthralgias and back pain.  Skin:  Negative for color change and rash.  Neurological:  Negative for seizures and syncope.  All other systems reviewed and are negative.  Physical Exam Updated Vital Signs BP 125/69   Pulse 60   Temp 97.8 F (36.6 C) (Oral)   Resp 14   SpO2 100%   Physical Exam Vitals and nursing note reviewed.  Constitutional:      General: He is not in acute distress.    Appearance: Normal appearance. He is well-developed.  HENT:     Head: Normocephalic and atraumatic.     Right Ear: External ear normal.     Left Ear: External ear normal.     Nose: Nose normal. No congestion.     Mouth/Throat:     Mouth: Mucous membranes are moist.     Pharynx: Oropharynx is clear. No posterior oropharyngeal erythema.  Eyes:     Extraocular Movements: Extraocular movements intact.     Conjunctiva/sclera: Conjunctivae normal.     Pupils: Pupils are equal, round, and reactive to light.  Cardiovascular:     Rate and Rhythm: Normal rate and regular rhythm.     Pulses: Normal pulses.     Heart sounds: No murmur heard. Pulmonary:     Effort: Pulmonary effort is normal. No respiratory distress.     Breath sounds: Normal breath sounds. No wheezing, rhonchi or rales.  Abdominal:     General: Abdomen is flat. Bowel sounds are normal.     Palpations: Abdomen is soft.     Tenderness: There is no abdominal tenderness. There is no guarding or rebound.  Musculoskeletal:        General: No swelling, tenderness or deformity. Normal range of motion.     Cervical back: Normal range of motion and neck supple. No  rigidity.  Skin:    General: Skin is warm and dry.     Capillary Refill: Capillary refill takes less than 2 seconds.     Findings: No rash.  Neurological:     General: No focal deficit present.     Mental Status: He is alert and oriented to person, place, and time.  Psychiatric:        Mood and Affect: Mood normal.    ED Results / Procedures / Treatments   Labs (all labs ordered are listed, but only abnormal results are displayed) Labs Reviewed  COMPREHENSIVE METABOLIC PANEL - Abnormal; Notable for the following components:      Result Value   Glucose, Bld 106 (*)    Calcium 8.8 (*)    GFR, Estimated 58 (*)    All other components within normal limits  CBC WITH DIFFERENTIAL/PLATELET  TROPONIN I (HIGH SENSITIVITY)    EKG EKG Interpretation  Date/Time:  Saturday July 15 2021 11:12:52 EST Ventricular Rate:  60 PR Interval:  222 QRS Duration: 148 QT Interval:  412 QTC Calculation: 412 R Axis:   -76 Text Interpretation: AV dual-paced rhythm with prolonged AV conduction Abnormal ECG Confirmed by Madalyn Rob [id number]) on 07/15/2021 3:17:21 PM  Radiology DG Chest 2 View  Result Date: 07/15/2021 CLINICAL DATA:  Tachycardia EXAM: CHEST - 2 VIEW COMPARISON:  Chest x-ray dated June 25, 2021 FINDINGS: Cardiac and mediastinal contours are unchanged status post median sternotomy. Left chest wall pacer with unchanged lead position. Left basilar atelectasis. No evidence of pleural effusion or pneumothorax. IMPRESSION: No active cardiopulmonary disease. Electronically Signed   By: Yetta Glassman M.D.   On: 07/15/2021 12:07  Procedures Procedures   Medications Ordered in ED Medications  loperamide (IMODIUM) capsule 4 mg (has no administration in time range)    ED Course  I have reviewed the triage vital signs and the nursing notes.  Pertinent labs & imaging results that were available during my care of the patient were reviewed by me and considered in my medical  decision making (see chart for details).    MDM Rules/Calculators/A&P                          87 year old male with history of sick sinus syndrome status post pacemaker presenting with concerns for elevated pulse rate as above.  He is afebrile and hemodynamically stable here.  Pulse in the low 60s.  EKG showed paced rhythm with no signs of arrhythmia or ischemic changes.  He is currently asymptomatic, denying any chest pain, pressure, dizziness, shortness of breath, headaches, abdominal pain.  He is still tolerating p.o. without difficulty.  He does not appear clinically dehydrated.  Pulses are full and symmetric.  His abdominal exam is benign.  No indications for IV fluids or abdominal imaging at this time.  Chemistry today showed normal electrolytes and kidney function.  His white count is normal.  His troponin is normal.  Patient appropriate for discharge home with close follow-up with PCP and cardiology team.  Strict return precautions provided.   Final Clinical Impression(s) / ED Diagnoses Final diagnoses:  History of cardiac pacemaker  Diarrhea, unspecified type    Rx / DC Orders ED Discharge Orders     None        Idamae Lusher, MD 07/15/21 1642    Lucrezia Starch, MD 07/15/21 2233

## 2021-07-17 ENCOUNTER — Telehealth: Payer: Self-pay | Admitting: Cardiovascular Disease

## 2021-07-17 NOTE — Telephone Encounter (Signed)
LM2CB- LIST OF HEART RATES/BP?

## 2021-07-17 NOTE — Telephone Encounter (Signed)
   STAT if HR is under 50 or over 120 (normal HR is 60-100 beats per minute)  What is your heart rate? 66  Do you have a log of your heart rate readings (document readings)?   Saturday HR was 93 which triggered ED visit   Do you have any other symptoms? no  Patient went to the ED Saturday morning for rapid HR. After testing he was told to call Dr. Loletha Grayer to see if he would need to be seen soon or not. He wanted to know if anything showed up on his monitor

## 2021-07-18 ENCOUNTER — Telehealth: Payer: Self-pay | Admitting: Cardiovascular Disease

## 2021-07-18 NOTE — Telephone Encounter (Signed)
Patient was told to call back to give his bp  9am 117/72 hr 86 2pm:116/64 hr 62

## 2021-07-18 NOTE — Telephone Encounter (Signed)
Spoke with patient about his recent ED visit. He stated that he was having heart rates in the 90's over the weekend which is abnormal for him. He was getting over the flu and frequent diarrhea so he felt like he should be assessed in the ED.  9am 117/72 hr 86 2pm:116/64 hr 62  He stated that he felt much better now. He does have a follow up on 12/19 with Dr. [name].

## 2021-07-20 ENCOUNTER — Other Ambulatory Visit: Payer: Self-pay

## 2021-07-20 MED ORDER — ISOSORBIDE MONONITRATE ER 30 MG PO TB24: ORAL_TABLET | ORAL | 5 refills | Status: DC

## 2021-07-24 ENCOUNTER — Other Ambulatory Visit: Payer: Self-pay

## 2021-07-24 ENCOUNTER — Encounter: Payer: Self-pay | Admitting: Cardiovascular Disease

## 2021-07-24 ENCOUNTER — Ambulatory Visit (INDEPENDENT_AMBULATORY_CARE_PROVIDER_SITE_OTHER): Payer: Medicare Other | Admitting: Cardiovascular Disease

## 2021-07-24 VITALS — BP 118/62 | HR 89 | Ht 69.5 in | Wt 192.6 lb

## 2021-07-24 DIAGNOSIS — I739 Peripheral vascular disease, unspecified: Secondary | ICD-10-CM | POA: Diagnosis not present

## 2021-07-24 DIAGNOSIS — I255 Ischemic cardiomyopathy: Secondary | ICD-10-CM | POA: Diagnosis not present

## 2021-07-24 DIAGNOSIS — I442 Atrioventricular block, complete: Secondary | ICD-10-CM | POA: Diagnosis not present

## 2021-07-24 DIAGNOSIS — Z8579 Personal history of other malignant neoplasms of lymphoid, hematopoietic and related tissues: Secondary | ICD-10-CM | POA: Diagnosis not present

## 2021-07-24 DIAGNOSIS — I25708 Atherosclerosis of coronary artery bypass graft(s), unspecified, with other forms of angina pectoris: Secondary | ICD-10-CM | POA: Diagnosis not present

## 2021-07-24 DIAGNOSIS — E785 Hyperlipidemia, unspecified: Secondary | ICD-10-CM | POA: Diagnosis not present

## 2021-07-24 DIAGNOSIS — Z950 Presence of cardiac pacemaker: Secondary | ICD-10-CM

## 2021-07-24 DIAGNOSIS — I10 Essential (primary) hypertension: Secondary | ICD-10-CM | POA: Diagnosis not present

## 2021-07-24 DIAGNOSIS — I6521 Occlusion and stenosis of right carotid artery: Secondary | ICD-10-CM

## 2021-07-24 DIAGNOSIS — I4729 Other ventricular tachycardia: Secondary | ICD-10-CM

## 2021-07-24 MED ORDER — METOPROLOL TARTRATE 100 MG PO TABS: 100.0000 mg | ORAL_TABLET | Freq: Two times a day (BID) | ORAL | 1 refills | Status: DC

## 2021-07-24 NOTE — Patient Instructions (Signed)
Medication Instructions:  INCREASE the Metoprolol to 100 mg twice daily  *If you need a refill on your cardiac medications before your next appointment, please call your pharmacy*   Lab Work: None ordered If you have labs (blood work) drawn today and your tests are completely normal, you will receive your results only by: [place] (if you have MyChart) OR A paper copy in the mail If you have any lab test that is abnormal or we need to change your treatment, we will call you to review the results.   Testing/Procedures: None ordered   Follow-Up: At [facility], you and your health needs are our priority.  As part of our continuing mission to provide you with exceptional heart care, we have created designated Provider Care Teams.  These Care Teams include your primary Cardiologist (physician) and Advanced Practice Providers (APPs -  Physician Assistants and Nurse Practitioners) who all work together to provide you with the care you need, when you need it.  We recommend signing up for the patient portal called "MyChart".  Sign up information is provided on this After Visit Summary.  MyChart is used to connect with patients for Virtual Visits (Telemedicine).  Patients are able to view lab/test results, encounter notes, upcoming appointments, etc.  Non-urgent messages can be sent to your provider as well.   To learn more about what you can do with MyChart, go to [email or web address].    Your next appointment:   6 month(s)  The format for your next appointment:   In Person  Provider:   Sanda Klein, MD

## 2021-07-24 NOTE — Progress Notes (Addendum)
Patient ID: David [name], male   DOB: [birth year], 87 y.o.   MRN: [id number] Patient ID: David [name], male   DOB: 03-03-1933, 87 y.o.   MRN: [id number]    Cardiology Office Note    Date:  07/26/2021   ID:  ARNOLD KESTER, DOB Sep 29, 1932, MRN [id number]  PCP:  Mayra Neer, MD  Cardiologist:  Quay Burow, MD (PAD);  Sanda Klein, MD   Chief Complaint  Patient presents with   Irregular Heart Beat     History of Present Illness:  David [name] is a 87 y.o. male David [name] is a 87 y.o. male who presents for follow-up on his pacemaker, pacemaker dependent due to complete heart block. He also has coronary artery disease, PAD of the lower extremities (has occluded popliteal stents in the left lower extremity), symptomatic PVCs (and one episode of lengthy nonsustained VT detected by his pacemaker).  He is generally doing well, no change in symptoms.  Continues to have CCS functional class II exertional angina.  It happens when he exerts himself in the yard and resolves promptly when he rests.  Adding amlodipine did not have any beneficial effect.  He is no longer taking that.  Since the symptoms resolved very promptly when he rests, he has not taking any nitroglycerin since his last appointment.  He is on moderate dose long-acting nitrates and beta-blocker.  He denies exertional dyspnea, orthopnea, PND or lower extremity edema.  He is not bothered at all by claudication anymore.  He has not had any focal neurological events and denies dizziness palpitations or syncope.  Timmothy [name] has been so accustomed with his heart rate always being around 60 bpm, that he actually went to the emergency room on December 10, because he had a heart rate of 93 bpm as checked with his blood pressure cuff.  He was having some runny stools, but he was otherwise asymptomatic.  He was evaluated in the emergency room and no major abnormalities were found on his labs, electrocardiogram and chest x-ray.    He has not had any  problems with chest pain or shortness of breath either at rest or with activity.  Denies recent problems with claudication.  Has not had palpitations, dizziness or syncope.  Denies lower extremity edema.  Interrogation of his pacemaker today shows normal device function.  He has a Patent attorney dual-chamber pacemaker that was implanted as new system with a new generator and leads in 2017, when his old device with unipolar leads reached end of service.  The old leads were left in place so even though his system is MRI compatible, he should not have an MRI due to abandoned leads.  He has 67% atrial pacing with appropriate heart rate histogram distribution and 97% ventricular pacing.  The burden of mode switch events is less than 1%.  01/18/2021 nuclear stress test did not show reversible ischemia, confirmed the known inferior and apical scar.  EF 36%.  [place] model number [id number], serial number [id number] Right atrial lead (new) [employer] MRI, model number 8921, serial number [id number] Right ventricular lead (new)  [employer] MRI, model number [id number], serial number [id number]. Old unipolar leads are still in place so that he is not MRI conditional despite the fact that the individual components of his new system are MRI conditional.  His most recent lipid profile in December 2021 shows total cholesterol 132, LDL 67, HDL 41.  His creatinine is 1.38  and his liver function tests were normal.    David [name] has had numerous coronary revascularization procedures, most recently a stent in the saphenous vein graft to the oblique marginal artery in July 2014. He is now almost 20 years status post coronary bypass surgery. The same bypass graft had received a stent in 2007. He has mild ischemic cardiomyopathy with an ejection fraction of 40-45%, without clinical  heart failure. The nuclear stress test on July 28, 2015 shows old scar in the apex and inferior  wall. LVEF is calculated at 43% (identical to 2014 and 2015).   He had occlusion of the left popliteal artery with distal reconstitution via collaterals. He had successful recanalization with placement of 2 stents by Dr. Andree Elk in 2015 . This led to marked improvement in his claudication, but the stents subsequently occluded.  Claudication improved with cilostazol. He is pacemaker dependent secondary to complete heart block. He had a generator changeout in 2017. Interrogation of his device today again shows some issues with "noise" on the atrial channel but none on the ventricular channel.  Moderate carotid artery disease (stable 60-79% proximal internal carotid artery stenosis on the right, mild plaque on the left), last duplex US was February 11, 2015.   He has a history of non-Hodgkin's lymphoma with a large neck mass but also mediastinal and abdominal lymphadenopathy treated with radiotherapy to the cervical area in 2011 followed by chemotherapy with Rituxan and fludarabine. He is considered to be in remission.   He has undergone cystoscopic resection of a bladder tumor and bilateral cataract surgery.    Past Medical History:  Diagnosis Date   Arthritis    "hands" (10/19/2013)   Bilateral carotid bruits 04/08/2020   Right greater than left   Bladder tumor    CAD (coronary artery disease)    cardiologist-  dr crouitoru   Carotid artery disease (HCC)    moderate pRICA 62-37%,  6-28% LICA , >31% RECA last duplex 08-05-2015   Cataract of both eyes    Complete heart block (HCC)    GERD (gastroesophageal reflux disease)    Glaucoma, both eyes    Hematuria    History of MI (myocardial infarction)    1989   Hyperlipidemia    Hypertension    Ischemic cardiomyopathy    ef 40-45%  without clinical heart failure per cardiology note 12-28-2015 (nuclear stress ef 43% from 07-28-2015)   Left ventricular dysfunction    Non Hodgkin's lymphoma ([place]) dx 06/2010---  oncologist-  dr Benay Spice---  in clinical  remission since 2013   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 -- lymphadenopathy to neck, mediastinal , and abdominal--  chemotherapy completed 06/ 2011   NSVT (nonsustained ventricular tachycardia) cardiologist-  dr crouitoru   Max rate 200 bpm, 28-beat, nearly 9-second episode recorded by pacemaker on November 15, asymptomatic    Pacemaker pacemaker dependant due to [place]   1st Pacemaker Placement 1998 w/ New generator  12-31-2000 ; 08-04-2008;  11-29-2015   PAD (peripheral artery disease) ([place])    followed by dr berry--- last duplex 08-05-2015  patents bilateral popliteal stents, bilateral SFA 30-49% stenosis   Peripheral vascular disease with claudication ([place])    dx 2009 left popliteal occlusion with collaterals; 03- 2015  s/p  stenting right popliteal  by dr berry and left popliteal stenting by dr Andree Elk ([facility]) july 2015   S/P CABG x 5    09-10-1988   S/P drug eluting coronary stent placement 01/04/2006  01-04-2006--- DES to SVG to OM1, OM2, and IR, and SVG to PDA(x2 grafts)  and 03-04-2013  DES to SVR to OM   Wears glasses     Past Surgical History:  Procedure Laterality Date   ANAL FISSURE REPAIR  12-09-2000 and 1970's   CARDIAC CATHETERIZATION  04/09/2000   patent grafts   CARDIAC PACEMAKER PLACEMENT  11-15-1993   dr Cyndia Bent   CARDIOVASCULAR STRESS TEST  07-28-2015  dr crouitoru   Intermediate risk nuclear study w/ large, severe, predominantly fixed inferior and apical defect; minimal reversibility inferior lateral wall; findings consistent w/ large prior infarct and trivial peri-infarct ischemia;  ef 43% w/ inferior hypokinesis and apical akinesis; mild LVE   CORONARY ANGIOPLASTY WITH STENT PLACEMENT  01-04-2006  dr gamble   DES to pSVG to OM1, SVG to OM2 &  IR, and mSVG to RCA dPDA (x2 grafts );  pLAD , pLCFX and  mRCA occluded;  LIMA to LAD mid-distal patent with occlusion beyond LIMA with collateral flow;  ef 30%   CORONARY ARTERY BYPASS GRAFT   09/10/1988   dr Redmond Pulling   "CABG X5" (03/04/2013) LIMA to LAD,SVG to imtermediate OM1 OM II,SVG to PDA   CYSTOSCOPY W/ RETROGRADES Bilateral 04/13/2016   Procedure: CYSTOSCOPY WITH RETROGRADE PYELOGRAM;  Surgeon: Alexis Frock, MD;  Location: [facility];  Service: Urology;  Laterality: Bilateral;   EP IMPLANTABLE DEVICE N/A 11/29/2015   Procedure: Pacemaker Implant;  Surgeon: Sanda Klein, MD;  Location: [facility] [facility];  Service: Cardiovascular;  Laterality: N/A;  new generator and new atrial, vertricular leads;  [employer] Accolade   LEFT HEART CATHETERIZATION WITH CORONARY/GRAFT ANGIOGRAM N/A 03/04/2013   Procedure: LEFT HEART CATHETERIZATION WITH Beatrix Fetters;  Surgeon: Troy Sine, MD;  Location: [facility] CATH LAB;  Service: Cardiovascular;  Laterality: N/A;   LOWER EXTREMITY ANGIOGRAM N/A 08/31/2013   Procedure: LOWER EXTREMITY ANGIOGRAM;  Surgeon: Lorretta Harp, MD;  Location: [facility] CATH LAB;  Service: Cardiovascular;  Laterality: N/A;   PACEMAKER REPLACEMENT  12/31/2000;  08-04-2008   PERCUTANEOUS CORONARY STENT INTERVENTION (PCI-S)  03/04/2013   Procedure: PERCUTANEOUS CORONARY STENT INTERVENTION (PCI-S);  Surgeon: Troy Sine, MD;  Location: [facility] CATH LAB;  Service: Cardiovascular;;  DES to S-OM for in-stent restenosis   POPLITEAL ARTERY STENT Right 10-21-2013  dr berry   atherectomy, PTA , and IDEV stent x1   POPLITEAL ARTERY STENT Left 07/ 2015  dr Andree Elk [facility]   angioplasty / stent x2   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   SUPERFICIAL LYMPH NODE BIOPSY / EXCISION Left 2011   cervical lymph node   TRANSURETHRAL RESECTION OF BLADDER TUMOR N/A 04/13/2016   Procedure: TRANSURETHRAL RESECTION OF BLADDER TUMOR (TURBT) with clot evacuation;  Surgeon: Alexis Frock, MD;  Location[facility];  Service: Urology;  Laterality: N/A;   VENOGRAM Left 11/29/2015   Procedure: Venogram;  Surgeon: Sanda Klein, MD;   Location: [place] [facility];  Service: Cardiovascular;  Laterality: Left;    Outpatient Medications Prior to Visit  Medication Sig Dispense Refill   aspirin EC 81 MG tablet Take 81 mg by mouth daily.     cetirizine (ZYRTEC) 10 MG tablet Take 10 mg by mouth every evening.     cilostazol (PLETAL) 100 MG tablet TAKE 1 TABLET BY MOUTH  TWICE DAILY 180 tablet 3   icosapent Ethyl (VASCEPA) 1 g capsule TAKE 2 CAPSULES BY MOUTH  TWICE DAILY 360 capsule 3  isosorbide mononitrate (IMDUR) 30 MG 24 hr tablet Take one tablet, 30 mg, in the morning and 60 mg, 2 tablets, in the evening. 90 tablet 5   lisinopril (ZESTRIL) 20 MG tablet Take 1 tablet (20 mg total) by mouth daily. 90 tablet 3   Multiple Vitamins-Minerals (SENIOR MULTIVITAMIN PLUS PO) Take 1 tablet by mouth daily.     rosuvastatin (CRESTOR) 40 MG tablet TAKE 1 TABLET BY MOUTH AT  BEDTIME 90 tablet 3   metoprolol tartrate (LOPRESSOR) 50 MG tablet TAKE 1 AND 1/2 TABLETS BY  MOUTH TWICE DAILY 270 tablet 3   benzonatate (TESSALON) 100 MG capsule Take 1 capsule (100 mg total) by mouth every 8 (eight) hours. 21 capsule 0   predniSONE (DELTASONE) 50 MG tablet Take 1 tablet (50 mg total) by mouth daily with breakfast. 5 tablet 0   No facility-administered medications prior to visit.     Allergies:   Azithromycin, Lactose intolerance (gi), Niacin and related, and Sulfa antibiotics   Social History   Socioeconomic History   Marital status: Married    Spouse name: Not on file   Number of children: Not on file   Years of education: Not on file   Highest education level: Not on file  Occupational History   Not on file  Tobacco Use   Smoking status: Former    Packs/day: 0.12    Years: 1.00    Pack years: 0.12    Types: Cigarettes    Quit date: 08/06/1956    Years since quitting: 65.0   Smokeless tobacco: Never  Substance and Sexual Activity   Alcohol use: No   Drug use: No   Sexual activity: Never  Other Topics Concern   Not on file   Social History Narrative   Not on file   Social Determinants of Health   Financial Resource Strain: Not on file  Food Insecurity: Not on file  Transportation Needs: Not on file  Physical Activity: Not on file  Stress: Not on file  Social Connections: Not on file     Family History:  The patient's family history includes Diabetes in his mother; Heart attack in his father; Hyperlipidemia in his sister and sister; Hypertension in his sister and sister; Stroke in his mother.   ROS:   Please see the history of present illness.   All other systems are reviewed and are negative.   PHYSICAL EXAM:   VS:  BP 118/62 (BP Location: Left Arm, Patient Position: Sitting, Cuff Size: Normal)    Pulse 89    Ht 5' 9.5" (1.765 m)    Wt 192 lb 9.6 oz (87.4 kg)    SpO2 96%    BMI 28.03 kg/m      General: Alert, oriented x3, no distress, appears younger than stated age and fit.  Healthy pacemaker site. Head: no evidence of trauma, PERRL, EOMI, no exophtalmos or lid lag, no myxedema, no xanthelasma; normal ears, nose and oropharynx Neck: normal jugular venous pulsations and no hepatojugular reflux; brisk carotid pulses without delay and no carotid bruits Chest: clear to auscultation, no signs of consolidation by percussion or palpation, normal fremitus, symmetrical and full respiratory excursions Cardiovascular: normal position and quality of the apical impulse, regular rhythm, normal first and paradoxically split second heart sounds, no murmurs, rubs or gallops Abdomen: no tenderness or distention, no masses by palpation, no abnormal pulsatility or arterial bruits, normal bowel sounds, no hepatosplenomegaly Extremities: no clubbing, cyanosis or edema; 2+ radial, ulnar and brachial pulses  bilaterally; 2+ right femoral, posterior tibial and dorsalis pedis pulses; 2+ left femoral, posterior tibial and dorsalis pedis pulses; no subclavian or femoral bruits Neurological: grossly nonfocal Psych: Normal mood and  affect     Wt Readings from Last 3 Encounters:  07/24/21 192 lb 9.6 oz (87.4 kg)  04/17/21 200 lb (90.7 kg)  02/14/21 199 lb 12.8 oz (90.6 kg)    Studies/Labs Reviewed:  Pharmacological nuclear stress test 01/18/2021 The left ventricular ejection fraction is moderately decreased (30-44%). Nuclear stress EF: 36%. There was no ST segment deviation noted during stress. Defect 1: There is a medium defect of severe severity present in the basal inferior, mid inferior, apical inferior, apical lateral and apex location. Findings consistent with prior myocardial infarction. This is an intermediate risk study due to reduced systolic function. There is no ischemia.   EKG:  EKG is not ordered today.  Reviewed the ECG from 07/15/2021 which shows AV sequential pacing Recent Labs: 07/15/2021: ALT 34; BUN 18; Creatinine, Ser 1.22; Hemoglobin 13.5; Platelets 197; Potassium 4.6; Sodium 135   Lipid Panel  07/15/2020 Cholesterol 132, HDL 41, LDL 67, triglycerides 136, creatinine 1.38, potassium 4.5, normal liver function tests     Component Value Date/Time   CHOL 119 06/24/2014 0855   TRIG 185 (H) 06/24/2014 0855   HDL 35 (L) 06/24/2014 0855   CHOLHDL 3.4 06/24/2014 0855   VLDL 37 06/24/2014 0855   LDLCALC 47 06/24/2014 0855   ASSESSMENT:    1. Coronary artery disease of bypass graft of native heart with stable angina pectoris ([place])   2. CHB (complete heart block) (HCC)   3. Pacemaker   4. Cardiomyopathy, ischemic- EF 40-45% by echo 4/14   5. PAD (peripheral artery disease) ([place])   6. NSVT (nonsustained ventricular tachycardia)   7. Essential hypertension   8. Asymptomatic stenosis of right carotid artery without infarction   9. Dyslipidemia   10. History of lymphoma        PLAN:  In order of problems listed above:  CAD (CABG 1995, PCI SVG-OM 2007 and 2014): He has infrequent angina pectoris on the current medical regimen, only with greater than usual physical activity.  We have  reviewed the difference being stable and unstable angina symptoms and when he should seek urgent medical attention. CHB: He is pacemaker dependent, without any escape rhythm. Pacemaker: Discussed the fact that his device has both accelerometer and minute ventilation sensors.  It is possible that hyper,ventilation can lead to faster heart rates at rest, which may explain his recent episode of relatively fast heartbeat that led to the emergency room evaluation.  Continue remote downloads every 3 months.  He has abandoned unipolar leads so he cannot have an MRI even though his current system is MRI conditional. Ischemic cardiomyopathy: Despite decreased EF he has never had symptoms or signs of heart failure.  He has never needed diuretics.  Continues to appear clinically euvolemic.  Have offered transition to [facility] due to the recent decrease in EF on his nuclear stress test to 36% but he prefers to remain on the same medications. PAD: With his current level of activity he does not have claudication.  Last year his complaints were more suggestive of neurogenic claudication than vascular disease.  Lower extremity arterial Dopplers performed in July 2021 showed ABI of 0.86 on the right and 0.81 on the left, essentially unchanged from the year before. PVCs/NSVT: Stable PVC burden at roughly 4%.  In the past used to have nonsustained VT  but this has not been seen in quite a while. HTN: Well-controlled with current medications. Asymptomatic stenosis of the right carotid artery: Most recent carotid duplex performed in February 2022 HLP: LDL within target range.  Borderline low HDL, which has been a chronic problem and has not improved despite reduction in weight. Lymphoma in remission  Medication Adjustments/Labs and Tests Ordered: Current medicines are reviewed at length with the patient today.  Concerns regarding medicines are outlined above.  Medication changes, Labs and Tests ordered today are listed in the  Patient Instructions below. Patient Instructions  Medication Instructions:  INCREASE the Metoprolol to 100 mg twice daily  *If you need a refill on your cardiac medications before your next appointment, please call your pharmacy*   Lab Work: None ordered If you have labs (blood work) drawn today and your tests are completely normal, you will receive your results only by: [place] (if you have MyChart) OR A paper copy in the mail If you have any lab test that is abnormal or we need to change your treatment, we will call you to review the results.   Testing/Procedures: None ordered   Follow-Up: At [facility], you and your health needs are our priority.  As part of our continuing mission to provide you with exceptional heart care, we have created designated Provider Care Teams.  These Care Teams include your primary Cardiologist (physician) and Advanced Practice Providers (APPs -  Physician Assistants and Nurse Practitioners) who all work together to provide you with the care you need, when you need it.  We recommend signing up for the patient portal called "MyChart".  Sign up information is provided on this After Visit Summary.  MyChart is used to connect with patients for Virtual Visits (Telemedicine).  Patients are able to view lab/test results, encounter notes, upcoming appointments, etc.  Non-urgent messages can be sent to your provider as well.   To learn more about what you can do with MyChart, go to [email or web address].    Your next appointment:   6 month(s)  The format for your next appointment:   In Person  Provider:   Sanda Klein, MD          Signed, Sanda Klein, MD  07/26/2021 4:54 PM    [address], [place], [place]  [address] Phone: [phone]; Fax: [phone]

## 2021-07-26 ENCOUNTER — Encounter: Payer: Self-pay | Admitting: Cardiovascular Disease

## 2021-08-01 DIAGNOSIS — E782 Mixed hyperlipidemia: Secondary | ICD-10-CM | POA: Diagnosis not present

## 2021-08-01 DIAGNOSIS — J301 Allergic rhinitis due to pollen: Secondary | ICD-10-CM | POA: Diagnosis not present

## 2021-08-01 DIAGNOSIS — I739 Peripheral vascular disease, unspecified: Secondary | ICD-10-CM | POA: Diagnosis not present

## 2021-08-01 DIAGNOSIS — N183 Chronic kidney disease, stage 3 unspecified: Secondary | ICD-10-CM | POA: Diagnosis not present

## 2021-08-01 DIAGNOSIS — Z0000 Encounter for general adult medical examination without abnormal findings: Secondary | ICD-10-CM | POA: Diagnosis not present

## 2021-08-01 DIAGNOSIS — N390 Urinary tract infection, site not specified: Secondary | ICD-10-CM | POA: Diagnosis not present

## 2021-08-01 DIAGNOSIS — I2510 Atherosclerotic heart disease of native coronary artery without angina pectoris: Secondary | ICD-10-CM | POA: Diagnosis not present

## 2021-08-01 DIAGNOSIS — Z8551 Personal history of malignant neoplasm of bladder: Secondary | ICD-10-CM | POA: Diagnosis not present

## 2021-08-01 DIAGNOSIS — M4800 Spinal stenosis, site unspecified: Secondary | ICD-10-CM | POA: Diagnosis not present

## 2021-08-01 DIAGNOSIS — R82998 Other abnormal findings in urine: Secondary | ICD-10-CM | POA: Diagnosis not present

## 2021-08-01 DIAGNOSIS — K219 Gastro-esophageal reflux disease without esophagitis: Secondary | ICD-10-CM | POA: Diagnosis not present

## 2021-08-01 DIAGNOSIS — C8590 Non-Hodgkin lymphoma, unspecified, unspecified site: Secondary | ICD-10-CM | POA: Diagnosis not present

## 2021-08-01 DIAGNOSIS — I119 Hypertensive heart disease without heart failure: Secondary | ICD-10-CM | POA: Diagnosis not present

## 2021-08-08 ENCOUNTER — Telehealth: Payer: Self-pay | Admitting: Cardiovascular Disease

## 2021-08-08 NOTE — Telephone Encounter (Signed)
Called patient, spoke with wife- advised I would route a message to schedulers to have them contact him to get scheduled.  Per last Carotid- okay to repeat in 12 months, due in February 2023- order is in.   Thank you!

## 2021-08-08 NOTE — Telephone Encounter (Signed)
Pt called and would like to know if he was suppose to be setup for a Carotid test.  He has not heard anything so he was not sure per his last visit   Best number  [phone]

## 2021-08-31 DIAGNOSIS — N390 Urinary tract infection, site not specified: Secondary | ICD-10-CM | POA: Diagnosis not present

## 2021-09-15 ENCOUNTER — Other Ambulatory Visit (HOSPITAL_COMMUNITY): Payer: Self-pay | Admitting: Cardiovascular Disease

## 2021-09-15 DIAGNOSIS — I6523 Occlusion and stenosis of bilateral carotid arteries: Secondary | ICD-10-CM

## 2021-09-22 ENCOUNTER — Other Ambulatory Visit: Payer: Self-pay

## 2021-09-22 ENCOUNTER — Ambulatory Visit (HOSPITAL_BASED_OUTPATIENT_CLINIC_OR_DEPARTMENT_OTHER)
Admission: RE | Admit: 2021-09-22 | Discharge: 2021-09-22 | Disposition: A | Discharge status: Home / Self Care | Payer: Medicare Other | Source: Ambulatory Visit | Attending: Cardiovascular Disease | Admitting: Cardiovascular Disease

## 2021-09-22 ENCOUNTER — Ambulatory Visit (HOSPITAL_COMMUNITY)
Admission: RE | Admit: 2021-09-22 | Discharge: 2021-09-22 | Disposition: A | Discharge status: Home / Self Care | Payer: Medicare Other | Source: Ambulatory Visit | Attending: Cardiovascular Disease | Admitting: Cardiovascular Disease

## 2021-09-22 ENCOUNTER — Other Ambulatory Visit (HOSPITAL_COMMUNITY): Payer: Self-pay | Admitting: Cardiovascular Disease

## 2021-09-22 DIAGNOSIS — I6523 Occlusion and stenosis of bilateral carotid arteries: Secondary | ICD-10-CM | POA: Diagnosis not present

## 2021-09-22 DIAGNOSIS — Z95820 Peripheral vascular angioplasty status with implants and grafts: Secondary | ICD-10-CM | POA: Diagnosis not present

## 2021-10-03 ENCOUNTER — Ambulatory Visit (INDEPENDENT_AMBULATORY_CARE_PROVIDER_SITE_OTHER): Payer: Medicare Other

## 2021-10-03 DIAGNOSIS — I255 Ischemic cardiomyopathy: Secondary | ICD-10-CM | POA: Diagnosis not present

## 2021-10-03 LAB — CUP PACEART REMOTE DEVICE CHECK
Battery Remaining Longevity: 90 mo
Battery Remaining Percentage: 85 %
Brady Statistic RA Percent Paced: 75 %
Brady Statistic RV Percent Paced: 99 %
Date Time Interrogation Session: 20230228022200
Implantable Lead Implant Date: 20170425
Implantable Lead Implant Date: 20170425
Implantable Lead Location: 753859
Implantable Lead Location: 753860
Implantable Lead Model: 7741
Implantable Lead Model: 7742
Implantable Lead Serial Number: 720495
Implantable Lead Serial Number: 763888
Implantable Pulse Generator Implant Date: 20170425
Lead Channel Impedance Value: 689 Ohm
Lead Channel Impedance Value: 844 Ohm
Lead Channel Pacing Threshold Amplitude: 0.7 V
Lead Channel Pacing Threshold Pulse Width: 0.4 ms
Lead Channel Setting Pacing Amplitude: 1.1 V
Lead Channel Setting Pacing Amplitude: 2.5 V
Lead Channel Setting Pacing Pulse Width: 0.4 ms
Lead Channel Setting Sensing Sensitivity: 2.5 mV
Pulse Gen Serial Number: 718025

## 2021-10-11 NOTE — Progress Notes (Signed)
Remote pacemaker transmission.   

## 2021-10-31 ENCOUNTER — Other Ambulatory Visit: Payer: Self-pay

## 2021-10-31 ENCOUNTER — Encounter: Payer: Self-pay | Admitting: Cardiovascular Disease

## 2021-10-31 ENCOUNTER — Ambulatory Visit (INDEPENDENT_AMBULATORY_CARE_PROVIDER_SITE_OTHER): Payer: Medicare Other | Admitting: Cardiovascular Disease

## 2021-10-31 DIAGNOSIS — I442 Atrioventricular block, complete: Secondary | ICD-10-CM

## 2021-10-31 DIAGNOSIS — R0989 Other specified symptoms and signs involving the circulatory and respiratory systems: Secondary | ICD-10-CM | POA: Diagnosis not present

## 2021-10-31 DIAGNOSIS — I739 Peripheral vascular disease, unspecified: Secondary | ICD-10-CM

## 2021-10-31 DIAGNOSIS — I25810 Atherosclerosis of coronary artery bypass graft(s) without angina pectoris: Secondary | ICD-10-CM | POA: Diagnosis not present

## 2021-10-31 DIAGNOSIS — I10 Essential (primary) hypertension: Secondary | ICD-10-CM | POA: Diagnosis not present

## 2021-10-31 DIAGNOSIS — E785 Hyperlipidemia, unspecified: Secondary | ICD-10-CM

## 2021-10-31 DIAGNOSIS — I255 Ischemic cardiomyopathy: Secondary | ICD-10-CM

## 2021-10-31 NOTE — Assessment & Plan Note (Signed)
History of CAD status post coronary artery bypass grafting in 1995 with a LIMA to his LAD, vein to intermediate branch, OM1 in 2 as well as the PDA.  His last catheterization was performed by Dr. Claiborne Billings 03/04/2013 at which time he had drug-eluting stent placed to his obtuse marginal vein graft.  His most recent functional study was performed 01/18/2021 that showed inferoapical and lateral scar without ischemia.  He denies chest pain or shortness of breath. ?

## 2021-10-31 NOTE — Assessment & Plan Note (Signed)
History of complete heart block status post [employer] pacemaker placed in 2009 followed by Dr. [name] ?

## 2021-10-31 NOTE — Assessment & Plan Note (Signed)
History of carotid artery disease with Doppler studies performed 09/22/2021 revealing moderate right ICA stenosis.  This will be repeated on an annual basis. ?

## 2021-10-31 NOTE — Assessment & Plan Note (Signed)
History of dyslipidemia on statin therapy with lipid profile performed 08/01/2021 revealing a total cholesterol of 129, LDL of 63 and HDL 43. ?

## 2021-10-31 NOTE — Progress Notes (Signed)
? ? ? ?10/31/2021 ?[name]   ?27-Nov-1932  ?[id number] ? ?Primary Physician David Neer, MD ?Primary Cardiologist: David Harp MD David [name], [place] ? ?HPI:  David [name] is a 88 y.o.  married Caucasian male father of 3 children is retired Tree surgeon for a company. He was initially patient of Dr. Rush Landmark [name]'s followed by Dr. Terance [name] and now Dr. Sallyanne [name]. I last saw him in the office 10/25/2020. He has a history of peripheral arterial disease with known moderate right internal carotid artery stenosis but with ultrasound as well as an occluded left popliteal artery by angiography back in 2009. He has geniculate collaterals with three-vessel runoff and lifestyle limiting claudication. His other problems include a history of hypertension and hyperlipidemia. He had coronary bypass grafting 09/10/88 and has had 3 stent procedures since most recently by Dr. Ellouise [name] on 03/04/13 which time he placed a drug-eluting stent in the obtuse marginal vein graft. He denies chest pain or shortness of breath. He is currently displaying chronic rehabilitation. Because of ongoing claudication left greater than right as well as advances in technology it was decided to attempt percutaneous revascularization of the left popliteal chronic total occlusion.I attempted this on 08/31/2013 unsuccessfully. I then performed staged right SFA diamondback orbital rotational atherectomy, PT and stenting using an IDEV stent with excellent angiographic result. Followup Dopplers performed on 11/13/13 revealed an ABI 1.1 on the right. The procedure was complicated by a left common femoral pseudoaneurysm which was compressed and ultimately closed confirmed by ultrasound. I ultimately referred him to Dr. Brunetta [name] at [facility] he was able to cross his left popliteal artery CTO successfully. Since that time he has had no claudication. He does have moderate right internal carotid artery stenosis was neurologically  asymptomatic. Dr. Sallyanne [name] follows him for his pacemaker as well. ?  ?Since I saw him 1 year ago he has remained stable.  He denies chest pain, shortness of breath or claudication.  Recent carotid Doppler showed moderately severe right ICA stenosis and lower extremity Dopplers revealed a widely patent right SFA stent with an occluded left popliteal stent. ?  ? ? ?Current Meds  ?Medication Sig  ? aspirin EC 81 MG tablet Take 81 mg by mouth daily.  ? cilostazol (PLETAL) 100 MG tablet TAKE 1 TABLET BY MOUTH  TWICE DAILY  ? icosapent Ethyl (VASCEPA) 1 g capsule TAKE 2 CAPSULES BY MOUTH  TWICE DAILY  ? isosorbide mononitrate (IMDUR) 30 MG 24 hr tablet Take one tablet, 30 mg, in the morning and 60 mg, 2 tablets, in the evening.  ? lisinopril (ZESTRIL) 20 MG tablet Take 1 tablet (20 mg total) by mouth daily.  ? metoprolol tartrate (LOPRESSOR) 100 MG tablet Take 1 tablet (100 mg total) by mouth 2 (two) times daily.  ? Multiple Vitamins-Minerals (SENIOR MULTIVITAMIN PLUS PO) Take 1 tablet by mouth daily.  ? rosuvastatin (CRESTOR) 40 MG tablet TAKE 1 TABLET BY MOUTH AT  BEDTIME  ?  ? ?Allergies  ?Allergen Reactions  ? Azithromycin Diarrhea  ? Lactose Intolerance (Gi) Diarrhea  ? Niacin And Related Diarrhea  ? Sulfa Antibiotics Rash  ? ? ?Social History  ? ?Socioeconomic History  ? Marital status: Married  ?  Spouse name: Not on file  ? Number of children: Not on file  ? Years of education: Not on file  ? Highest education level: Not on file  ?Occupational History  ? Not on file  ?Tobacco Use  ? Smoking  status: Former  ?  Packs/day: 0.12  ?  Years: 1.00  ?  Pack years: 0.12  ?  Types: Cigarettes  ?  Quit date: 08/06/1956  ?  Years since quitting: 65.2  ? Smokeless tobacco: Never  ?Substance and Sexual Activity  ? Alcohol use: No  ? Drug use: No  ? Sexual activity: Never  ?Other Topics Concern  ? Not on file  ?Social History Narrative  ? Not on file  ? ?Social Determinants of Health  ? ?Financial Resource Strain: Not on file   ?Food Insecurity: Not on file  ?Transportation Needs: Not on file  ?Physical Activity: Not on file  ?Stress: Not on file  ?Social Connections: Not on file  ?Intimate Partner Violence: Not on file  ?  ? ?Review of Systems: ?General: negative for chills, fever, night sweats or weight changes.  ?Cardiovascular: negative for chest pain, dyspnea on exertion, edema, orthopnea, palpitations, paroxysmal nocturnal dyspnea or shortness of breath ?Dermatological: negative for rash ?Respiratory: negative for cough or wheezing ?Urologic: negative for hematuria ?Abdominal: negative for nausea, vomiting, diarrhea, bright red blood per rectum, melena, or hematemesis ?Neurologic: negative for visual changes, syncope, or dizziness ?All other systems reviewed and are otherwise negative except as noted above. ? ? ? ?Blood pressure (!) 106/52, pulse 65, height 5' 9.5" (1.765 m), weight 192 lb (87.1 kg).  ?General appearance: alert and no distress ?Neck: no adenopathy, no JVD, supple, symmetrical, trachea midline, thyroid not enlarged, symmetric, no tenderness/mass/nodules, and high-pitched right carotid bruit ?Lungs: clear to auscultation bilaterally ?Heart: regular rate and rhythm, S1, S2 normal, no murmur, click, rub or gallop ?Extremities: extremities normal, atraumatic, no cyanosis or edema ?Pulses: Diminished left pedal pulse ?Skin: Skin color, texture, turgor normal. No rashes or lesions ?Neurologic: Grossly normal ? ?EKG atrially paced rhythm at 65.  I personally reviewed this EKG. ? ?ASSESSMENT AND PLAN:  ? ?Complete heart block- [facility] pacemaker '95 with Gen change '02, '09. New leads 2017. Pacer dependednt ?History of complete heart block status post [employer] pacemaker placed in 2009 followed by Dr. Sallyanne [name] ? ?PVD - Lt popliteal occlusion with collaterals '09; rt popliteal successful diamondback orbital rotational atherectomy PTA and stent using an IDEV stent 10/21/13 ?History of peripheral arterial disease status post  right SFA diamondback orbital rotational atherectomy, PTA and stenting using an ID EV stent with excellent result.  I did send him to [facility] and Dr. Andree Elk performed popliteal artery stenting of a CTO as well.  His most recent Doppler studies show a patent right SFA stent with an occluded left popliteal artery stent.  He denies claudication. ? ?CAD s/p CABG 1995, s/p stent SVG-OM 2007 and July 2014 ?History of CAD status post coronary artery bypass grafting in 1995 with a LIMA to his LAD, vein to intermediate branch, OM1 in 2 as well as the PDA.  His last catheterization was performed by Dr. Claiborne Billings 03/04/2013 at which time he had drug-eluting stent placed to his obtuse marginal vein graft.  His most recent functional study was performed 01/18/2021 that showed inferoapical and lateral scar without ischemia.  He denies chest pain or shortness of breath. ? ?Dyslipidemia ?History of dyslipidemia on statin therapy with lipid profile performed 08/01/2021 revealing a total cholesterol of 129, LDL of 63 and HDL 43. ? ?HTN (hypertension) ?History of essential hypertension a blood pressure measured today at 106/52.  He is on lisinopril and metoprolol. ? ?Cardiomyopathy, ischemic- EF 40-45% by echo 4/14 ?History of ischemic cardiomyopathy with an EF  by 2D echocardiogram performed 02/23/2021 of 45 to 50%.  He is on metoprolol and lisinopril.  He does not have symptoms of heart failure. ? ?Bilateral carotid bruits ?History of carotid artery disease with Doppler studies performed 09/22/2021 revealing moderate right ICA stenosis.  This will be repeated on an annual basis. ? ? ? ? ?David Harp MD FACP,FACC,FAHA, FSCAI ?10/31/2021 ?10:03 AM ?

## 2021-10-31 NOTE — Assessment & Plan Note (Signed)
History of ischemic cardiomyopathy with an EF by 2D echocardiogram performed 02/23/2021 of 45 to 50%.  He is on metoprolol and lisinopril.  He does not have symptoms of heart failure. ?

## 2021-10-31 NOTE — Patient Instructions (Signed)
Medication Instructions:  ?Your physician recommends that you continue on your current medications as directed. Please refer to the Current Medication list given to you today. ? ?*If you need a refill on your cardiac medications before your next appointment, please call your pharmacy* ? ? ?Testing/Procedures: ?Your physician has requested that you have a carotid duplex. This test is an ultrasound of the carotid arteries in your neck. It looks at blood flow through these arteries that supply the brain with blood. Allow one hour for this exam. There are no restrictions or special instructions. To be done in February 2024. This procedure is done at [facility]. [address] ? ? ?Follow-Up: ?At [facility], you and your health needs are our priority.  As part of our continuing mission to provide you with exceptional heart care, we have created designated Provider Care Teams.  These Care Teams include your primary Cardiologist (physician) and Advanced Practice Providers (APPs -  Physician Assistants and Nurse Practitioners) who all work together to provide you with the care you need, when you need it. ? ?We recommend signing up for the patient portal called "MyChart".  Sign up information is provided on this After Visit Summary.  MyChart is used to connect with patients for Virtual Visits (Telemedicine).  Patients are able to view lab/test results, encounter notes, upcoming appointments, etc.  Non-urgent messages can be sent to your provider as well.   ?To learn more about what you can do with MyChart, go to [email or web address].   ? ?Your next appointment:   ?12 month(s) ? ?The format for your next appointment:   ?In Person ? ?Provider:   ?Quay Burow, MD ? ?

## 2021-10-31 NOTE — Assessment & Plan Note (Signed)
History of peripheral arterial disease status post right SFA diamondback orbital rotational atherectomy, PTA and stenting using an ID EV stent with excellent result.  I did send him to [facility] and Dr. Andree Elk performed popliteal artery stenting of a CTO as well.  His most recent Doppler studies show a patent right SFA stent with an occluded left popliteal artery stent.  He denies claudication. ?

## 2021-10-31 NOTE — Assessment & Plan Note (Signed)
History of essential hypertension a blood pressure measured today at 106/52.  He is on lisinopril and metoprolol. ?

## 2022-01-02 ENCOUNTER — Ambulatory Visit (INDEPENDENT_AMBULATORY_CARE_PROVIDER_SITE_OTHER): Payer: Medicare Other

## 2022-01-02 DIAGNOSIS — I447 Left bundle-branch block, unspecified: Secondary | ICD-10-CM | POA: Diagnosis not present

## 2022-01-03 LAB — CUP PACEART REMOTE DEVICE CHECK
Battery Remaining Longevity: 84 mo
Battery Remaining Percentage: 83 %
Brady Statistic RA Percent Paced: 78 %
Brady Statistic RV Percent Paced: 99 %
Date Time Interrogation Session: 20230530022100
Implantable Lead Implant Date: 20170425
Implantable Lead Implant Date: 20170425
Implantable Lead Location: 753859
Implantable Lead Location: 753860
Implantable Lead Model: 7741
Implantable Lead Model: 7742
Implantable Lead Serial Number: 720495
Implantable Lead Serial Number: 763888
Implantable Pulse Generator Implant Date: 20170425
Lead Channel Impedance Value: 701 Ohm
Lead Channel Impedance Value: 834 Ohm
Lead Channel Pacing Threshold Amplitude: 0.6 V
Lead Channel Pacing Threshold Pulse Width: 0.4 ms
Lead Channel Setting Pacing Amplitude: 1.1 V
Lead Channel Setting Pacing Amplitude: 2.5 V
Lead Channel Setting Pacing Pulse Width: 0.4 ms
Lead Channel Setting Sensing Sensitivity: 2.5 mV
Pulse Gen Serial Number: 718025

## 2022-01-12 DIAGNOSIS — R109 Unspecified abdominal pain: Secondary | ICD-10-CM | POA: Diagnosis not present

## 2022-01-17 NOTE — Progress Notes (Signed)
Remote pacemaker transmission.   

## 2022-01-19 ENCOUNTER — Other Ambulatory Visit: Payer: Self-pay | Admitting: Cardiovascular Disease

## 2022-01-29 ENCOUNTER — Encounter: Payer: Self-pay | Admitting: Cardiovascular Disease

## 2022-01-29 ENCOUNTER — Ambulatory Visit (INDEPENDENT_AMBULATORY_CARE_PROVIDER_SITE_OTHER): Payer: Medicare Other | Admitting: Cardiovascular Disease

## 2022-01-29 ENCOUNTER — Telehealth: Payer: Self-pay | Admitting: Cardiovascular Disease

## 2022-01-29 VITALS — BP 132/66 | HR 62 | Ht 69.5 in | Wt 193.2 lb

## 2022-01-29 DIAGNOSIS — Z950 Presence of cardiac pacemaker: Secondary | ICD-10-CM | POA: Diagnosis not present

## 2022-01-29 DIAGNOSIS — I6521 Occlusion and stenosis of right carotid artery: Secondary | ICD-10-CM | POA: Diagnosis not present

## 2022-01-29 DIAGNOSIS — E785 Hyperlipidemia, unspecified: Secondary | ICD-10-CM

## 2022-01-29 DIAGNOSIS — I25718 Atherosclerosis of autologous vein coronary artery bypass graft(s) with other forms of angina pectoris: Secondary | ICD-10-CM | POA: Diagnosis not present

## 2022-01-29 DIAGNOSIS — I739 Peripheral vascular disease, unspecified: Secondary | ICD-10-CM | POA: Diagnosis not present

## 2022-01-29 DIAGNOSIS — I4729 Other ventricular tachycardia: Secondary | ICD-10-CM | POA: Diagnosis not present

## 2022-01-29 DIAGNOSIS — I10 Essential (primary) hypertension: Secondary | ICD-10-CM | POA: Diagnosis not present

## 2022-01-29 DIAGNOSIS — I442 Atrioventricular block, complete: Secondary | ICD-10-CM

## 2022-01-29 DIAGNOSIS — I255 Ischemic cardiomyopathy: Secondary | ICD-10-CM | POA: Diagnosis not present

## 2022-01-29 NOTE — Telephone Encounter (Signed)
*  STAT* If patient is at the pharmacy, call can be transferred to refill team.   1. Which medications need to be refilled? (please list name of each medication and dose if known)   isosorbide mononitrate (IMDUR) 30 MG 24 hr tablet  metoprolol tartrate (LOPRESSOR) 100 MG tablet  2. Which pharmacy/location (including street and city if local pharmacy) is medication to be sent to?  [facility] ([facility] ) - [place], [place] - [address]  3. Do they need a 30 day or 90 day supply? 90 for both  Pt is requesting that medication be sent to a different pharmacy.

## 2022-01-29 NOTE — Progress Notes (Signed)
Patient ID: David [name], male   DOB: 1933/04/27, 88 y.o.   MRN: [id number] Patient ID: David [name], male   DOB: 01/04/1933, 88 y.o.   MRN: [id number]    Cardiology Office Note    Date:  02/03/2022   ID:  David [name], DOB 21-Jan-1933, MRN [id number]  PCP:  Mayra Neer, MD  Cardiologist:  Quay Burow, MD (PAD);  Sanda Klein, MD   Chief Complaint  Patient presents with   Coronary Artery Disease     History of Present Illness:  David [name] is a 88 y.o. male David [name] is a 88 y.o. male with complete heart block, coronary artery disease, PAD of the lower extremities (has occluded popliteal stents in the left lower extremity), symptomatic PVCs (and one episode of lengthy nonsustained VT detected by his pacemaker).  He is generally doing well, no change in symptoms.  Continues to have CCS functional class II exertional angina.  It happens when he exerts himself in the yard and resolves promptly when he rests.  Adding amlodipine did not have any beneficial effect.  He is no longer taking that.  Since the symptoms resolved very promptly when he rests, he has not taking any nitroglycerin since his last appointment.  He is on moderate dose long-acting nitrates and beta-blocker.  He denies exertional dyspnea, orthopnea, PND or lower extremity edema.  He is not bothered at all by claudication anymore.  He has not had any focal neurological events and denies dizziness palpitations or syncope.  Overall doing well.  Continues to have CCS functional class II exertional angina pectoris (months taking out the full trash container to the curb, roughly 100 feet), with symptoms promptly relieved by rest.  He is also limited by what sounds like neurogenic claudication (he gets leg pain and numbness if he stands at the kitchen sink or kitchen counter for too long, or if he walks longer distances).  He denies any shortness of breath with activity and does not have orthopnea PND or lower extremity edema.   Does not have leg pain at rest when lying in bed.  Has not had any ulcerations of the lower extremities.  Denies focal neurological complaints.  Pacemaker interrogation shows normal device function. He has a Patent attorney dual-chamber pacemaker that was implanted as new system with a new generator and leads in 2017, when his old device with unipolar leads reached end of service.  The old leads were left in place so even though his system is MRI compatible, he should not have an MRI due to abandoned leads.  He has 78 % atrial pacing with appropriate heart rate histogram distribution and 99 % ventricular pacing.  The burden of mode switch events is well under 1% (grand total of 2.4 minutes of mode switch, mostly bursts of brief ectopic atrial tachycardia, no true atrial fibrillation seen).  Heart rate histogram distribution is appropriate for activity level.  01/18/2021 nuclear stress test did not show reversible ischemia, confirmed the known inferior and apical scar.  EF 36%.  However, his most recent echocardiogram performed 02/23/2021 showed LVEF 45-50% which was an improvement from previous evaluation (has apical akinesis).    His most recent lipid profile in December 2022 shows total cholesterol 129, LDL 63, HDL 43, TG 129.  His creatinine is 1.27 and his liver function tests were normal.    [employer] EL DR model number [id number], serial number [id number] Right atrial lead (new) [place] MRI,  model number [id number], serial number [id number] Right ventricular lead (new)  [employer] MRI, model number [id number], serial number [id number]. Old unipolar leads are still in place so that he is not MRI conditional despite the fact that the individual components of his new system are MRI conditional.  David [name] has had numerous coronary revascularization procedures, most recently a stent in the saphenous vein graft to the oblique marginal artery in July 2014. He is now  almost 20 years status post coronary bypass surgery. The same bypass graft had received a stent in 2007. He has mild ischemic cardiomyopathy with an ejection fraction of 40-45%, without clinical  heart failure. The nuclear stress test on July 28, 2015 shows old scar in the apex and inferior wall. LVEF is calculated at 43% (identical to 2014 and 2015).   He had occlusion of the left popliteal artery with distal reconstitution via collaterals. He had successful recanalization with placement of 2 stents by Dr. Andree Elk in 2015 . This led to marked improvement in his claudication, but the stents subsequently occluded.  Claudication improved with cilostazol. He is pacemaker dependent secondary to complete heart block. He had a generator changeout in 2017. Interrogation of his device today again shows some issues with "noise" on the atrial channel but none on the ventricular channel.  Moderate carotid artery disease (stable 60-79% proximal internal carotid artery stenosis on the right, mild plaque on the left), last duplex US was February 11, 2015.   He has a history of non-Hodgkin's lymphoma with a large neck mass but also mediastinal and abdominal lymphadenopathy treated with radiotherapy to the cervical area in 2011 followed by chemotherapy with Rituxan and fludarabine. He is considered to be in remission.   He has undergone cystoscopic resection of a bladder tumor and bilateral cataract surgery.    Past Medical History:  Diagnosis Date   Arthritis    "hands" (10/19/2013)   Bilateral carotid bruits 04/08/2020   Right greater than left   Bladder tumor    CAD (coronary artery disease)    cardiologist-  dr crouitoru   Carotid artery disease (HCC)    moderate pRICA 54-00%,  8-67% LICA , >61% RECA last duplex 08-05-2015   Cataract of both eyes    Complete heart block (HCC)    GERD (gastroesophageal reflux disease)    Glaucoma, both eyes    Hematuria    History of MI (myocardial infarction)    1989    Hyperlipidemia    Hypertension    Ischemic cardiomyopathy    ef 40-45%  without clinical heart failure per cardiology note 12-28-2015 (nuclear stress ef 43% from 07-28-2015)   Left ventricular dysfunction    Non Hodgkin's lymphoma ([place]) dx 06/2010---  oncologist-  dr Benay Spice---  in clinical remission since 2013   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 -- lymphadenopathy to neck, mediastinal , and abdominal--  chemotherapy completed 06/ 2011   NSVT (nonsustained ventricular tachycardia) [facility]) cardiologist-  dr crouitoru   Max rate 200 bpm, 28-beat, nearly 9-second episode recorded by pacemaker on November 15, asymptomatic    Pacemaker pacemaker dependant due to [place]   1st Pacemaker Placement 1998 w/ New generator  12-31-2000 ; 08-04-2008;  11-29-2015   PAD (peripheral artery disease) ([place])    followed by dr berry--- last duplex 08-05-2015  patents bilateral popliteal stents, bilateral SFA 30-49% stenosis   Peripheral vascular disease with claudication ([place])    dx 2009 left popliteal occlusion with collaterals; 03-  2015  s/p  stenting right popliteal  by dr berry and left popliteal stenting by dr Andree Elk ([facility]) july 2015   S/P CABG x 5    09-10-1988   S/P drug eluting coronary stent placement 01/04/2006   01-04-2006--- DES to SVG to OM1, OM2, and IR, and SVG to PDA(x2 grafts)  and 03-04-2013  DES to SVR to OM   Wears glasses     Past Surgical History:  Procedure Laterality Date   ANAL FISSURE REPAIR  12-09-2000 and 1970's   CARDIAC CATHETERIZATION  04/09/2000   patent grafts   CARDIAC PACEMAKER PLACEMENT  11-15-1993   dr Cyndia Bent   CARDIOVASCULAR STRESS TEST  07-28-2015  dr crouitoru   Intermediate risk nuclear study w/ large, severe, predominantly fixed inferior and apical defect; minimal reversibility inferior lateral wall; findings consistent w/ large prior infarct and trivial peri-infarct ischemia;  ef 43% w/ inferior hypokinesis and apical akinesis; mild LVE    CORONARY ANGIOPLASTY WITH STENT PLACEMENT  01-04-2006  dr gamble   DES to pSVG to OM1, SVG to OM2 &  IR, and mSVG to RCA dPDA (x2 grafts );  pLAD , pLCFX and  mRCA occluded;  LIMA to LAD mid-distal patent with occlusion beyond LIMA with collateral flow;  ef 30%   CORONARY ARTERY BYPASS GRAFT  09/10/1988   dr Redmond Pulling   "CABG X5" (03/04/2013) LIMA to LAD,SVG to imtermediate OM1 OM II,SVG to PDA   CYSTOSCOPY W/ RETROGRADES Bilateral 04/13/2016   Procedure: CYSTOSCOPY WITH RETROGRADE PYELOGRAM;  Surgeon: Alexis Frock, MD;  [facility];  Service: Urology;  Laterality: Bilateral;   EP IMPLANTABLE DEVICE N/A 11/29/2015   Procedure: Pacemaker Implant;  Surgeon: Sanda Klein, MD;  Location: [place] CV LAB;  Service: Cardiovascular;  Laterality: N/A;  new generator and new atrial, vertricular leads;  [employer] Accolade   LEFT HEART CATHETERIZATION WITH CORONARY/GRAFT ANGIOGRAM N/A 03/04/2013   Procedure: LEFT HEART CATHETERIZATION WITH Beatrix Fetters;  Surgeon: Troy Sine, MD;  Location: [facility] CATH LAB;  Service: Cardiovascular;  Laterality: N/A;   LOWER EXTREMITY ANGIOGRAM N/A 08/31/2013   Procedure: LOWER EXTREMITY ANGIOGRAM;  Surgeon: Lorretta Harp, MD;  Location: [facility] CATH LAB;  Service: Cardiovascular;  Laterality: N/A;   PACEMAKER REPLACEMENT  12/31/2000;  08-04-2008   PERCUTANEOUS CORONARY STENT INTERVENTION (PCI-S)  03/04/2013   Procedure: PERCUTANEOUS CORONARY STENT INTERVENTION (PCI-S);  Surgeon: Troy Sine, MD;  Location: [facility] CATH LAB;  Service: Cardiovascular;;  DES to S-OM for in-stent restenosis   POPLITEAL ARTERY STENT Right 10-21-2013  dr berry   atherectomy, PTA , and IDEV stent x1   POPLITEAL ARTERY STENT Left 07/ 2015  dr [facility]ree [facility]k at [facility]   angioplasty / stent x2   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   SUPERFICIAL LYMPH NODE BIOPSY / EXCISION Left 2011   cervical lymph node   TRANSURETHRAL RESECTION OF BLADDER  TUMOR N/A 04/13/2016   Procedure: TRANSURETHRAL RESECTION OF BLADDER TUMOR (TURBT) with clot evacuation;  Surgeon: Alexis Frock, MD;  Location: [facility];  Service: Urology;  Laterality: N/A;   VENOGRAM Left 11/29/2015   Procedure: Venogram;  Surgeon: Sanda Klein, MD;  Location: [place] [facility] LAB;  Service: Cardiovascular;  Laterality: Left;    Outpatient Medications Prior to Visit  Medication Sig Dispense Refill   aspirin EC 81 MG tablet Take 81 mg by mouth daily.     cilostazol (PLETAL) 100 MG tablet TAKE 1 TABLET BY  MOUTH  TWICE DAILY 180 tablet 3   famotidine (PEPCID) 20 MG tablet Take 20 mg by mouth as needed.     icosapent Ethyl (VASCEPA) 1 g capsule TAKE 2 CAPSULES BY MOUTH  TWICE DAILY 360 capsule 3   lisinopril (ZESTRIL) 20 MG tablet Take 1 tablet (20 mg total) by mouth daily. 90 tablet 3   Multiple Vitamins-Minerals (SENIOR MULTIVITAMIN PLUS PO) Take 1 tablet by mouth daily.     rosuvastatin (CRESTOR) 40 MG tablet TAKE 1 TABLET BY MOUTH AT  BEDTIME 90 tablet 3   isosorbide mononitrate (IMDUR) 30 MG 24 hr tablet TAKE 1 TABLET BY MOUTH IN THE MORNING AND 2 TABLETS IN THE EVENING 90 tablet 10   metoprolol tartrate (LOPRESSOR) 100 MG tablet Take 1 tablet (100 mg total) by mouth 2 (two) times daily. 180 tablet 1   No facility-administered medications prior to visit.     Allergies:   Azithromycin, Lactose intolerance (gi), Niacin and related, and Sulfa antibiotics   Social History   Socioeconomic History   Marital status: Married    Spouse name: Not on file   Number of children: Not on file   Years of education: Not on file   Highest education level: Not on file  Occupational History   Not on file  Tobacco Use   Smoking status: Former    Packs/day: 0.12    Years: 1.00    Total pack years: 0.12    Types: Cigarettes    Quit date: 08/06/1956    Years since quitting: 65.5   Smokeless tobacco: Never  Substance and Sexual Activity   Alcohol use: No   Drug  use: No   Sexual activity: Never  Other Topics Concern   Not on file  Social History Narrative   Not on file   Social Determinants of Health   Financial Resource Strain: Not on file  Food Insecurity: Not on file  Transportation Needs: Not on file  Physical Activity: Not on file  Stress: Not on file  Social Connections: Not on file     Family History:  The patient's family history includes Diabetes in his mother; Heart attack in his father; Hyperlipidemia in his sister and sister; Hypertension in his sister and sister; Stroke in his mother.   ROS:   Please see the history of present illness.   All other systems are reviewed and are negative.   PHYSICAL EXAM:   VS:  BP 132/66 (BP Location: Left Arm, Patient Position: Sitting, Cuff Size: Normal)   Pulse 62   Ht 5' 9.5" (1.765 m)   Wt 193 lb 3.2 oz (87.6 kg)   SpO2 93%   BMI 28.12 kg/m      General: Alert, oriented x3, no distress, appears younger than stated age and fit.  Healthy pacemaker site. Head: no evidence of trauma, PERRL, EOMI, no exophtalmos or lid lag, no myxedema, no xanthelasma; normal ears, nose and oropharynx Neck: normal jugular venous pulsations and no hepatojugular reflux; brisk carotid pulses without delay and no carotid bruits Chest: clear to auscultation, no signs of consolidation by percussion or palpation, normal fremitus, symmetrical and full respiratory excursions Cardiovascular: normal position and quality of the apical impulse, regular rhythm, normal first and paradoxically split second heart sounds, no murmurs, rubs or gallops Abdomen: no tenderness or distention, no masses by palpation, no abnormal pulsatility or arterial bruits, normal bowel sounds, no hepatosplenomegaly Extremities: no clubbing, cyanosis or edema; 2+ radial, ulnar and brachial pulses bilaterally; 2+ right  femoral, posterior tibial and dorsalis pedis pulses; 2+ left femoral, posterior tibial and dorsalis pedis pulses; no subclavian or  femoral bruits Neurological: grossly nonfocal Psych: Normal mood and affect     Wt Readings from Last 3 Encounters:  01/29/22 193 lb 3.2 oz (87.6 kg)  10/31/21 192 lb (87.1 kg)  07/24/21 192 lb 9.6 oz (87.4 kg)    Studies/Labs Reviewed:  Pharmacological nuclear stress test 01/18/2021 The left ventricular ejection fraction is moderately decreased (30-44%). Nuclear stress EF: 36%. There was no ST segment deviation noted during stress. Defect 1: There is a medium defect of severe severity present in the basal inferior, mid inferior, apical inferior, apical lateral and apex location. Findings consistent with prior myocardial infarction. This is an intermediate risk study due to reduced systolic function. There is no ischemia.   EKG:  EKG is not ordered today.  Reviewed the ECG from 07/15/2021 which shows AV sequential pacing Recent Labs: 07/15/2021: ALT 34; BUN 18; Creatinine, Ser 1.22; Hemoglobin 13.5; Platelets 197; Potassium 4.6; Sodium 135   Lipid Panel  07/15/2020 Cholesterol 132, HDL 41, LDL 67, triglycerides 136, creatinine 1.38, potassium 4.5, normal liver function tests     Component Value Date/Time   CHOL 119 06/24/2014 0855   TRIG 185 (H) 06/24/2014 0855   HDL 35 (L) 06/24/2014 0855   CHOLHDL 3.4 06/24/2014 0855   VLDL 37 06/24/2014 0855   LDLCALC 47 06/24/2014 0855   ASSESSMENT:    1. Coronary artery disease of autologous vein bypass graft with stable angina pectoris ([place])   2. CHB (complete heart block) (HCC)   3. Pacemaker   4. Cardiomyopathy, ischemic- EF 40-45% by echo 4/14   5. PAD (peripheral artery disease) ([place])   6. NSVT (nonsustained ventricular tachycardia) (HCC)   7. Essential hypertension   8. Asymptomatic stenosis of right carotid artery without infarction   9. Dyslipidemia         PLAN:  In order of problems listed above:  CAD (CABG 1995, PCI SVG-OM 2007 and 2014): Infrequent stable angina pectoris, CCS functional class II.  Has not  required sublingual nitroglycerin.  We have reviewed the difference being stable and unstable angina symptoms and when he should seek urgent medical attention. CHB: No escape rhythm, pacemaker dependent. Pacemaker: Normal device function.  Continue remote downloads every 3 months.  He has abandoned unipolar leads so he cannot have an MRI even though his current system is MRI conditional. Ischemic cardiomyopathy: He has not had manifestations of heart failure, despite decreased EF.  He has never needed diuretics.  Continues to appear clinically euvolemic.  Have offered transition to [facility] due to the recent decrease in EF on his nuclear stress test to 36% but he prefers to remain on the same medications.  In the absence of symptoms of heart failure or objective hypervolemia, doubt that he would benefit from SGLT2 inhibitors with spironolactone. PAD: Although he likely has some degree of vascular insufficiency, current complaints are more consistent with neurogenic claudication from spinal stenosis (he has pain in his legs just standing at the kitchen sink).  02/15/2021 ABI actually shows some improvement (right 0.90, left 1.11). PVCs/NSVT: Relatively low burden of PVCs now, usually around 4-5%.  In the past used to have nonsustained VT but this has not been seen in quite a while. HTN: Well-controlled Asymptomatic stenosis of the right carotid artery: Most recent carotid duplex performed 09/22/2021 showed mild progression of the stenosis in the right internal carotid artery (60-79%) with plans to  repeat in 12 months HLP: LDL within target range <70.  Borderline low HDL, which has been a chronic problem and has not improved despite reduction in weight. Lymphoma in remission  Medication Adjustments/Labs and Tests Ordered: Current medicines are reviewed at length with the patient today.  Concerns regarding medicines are outlined above.  Medication changes, Labs and Tests ordered today are listed in the  Patient Instructions below. Patient Instructions  Medication Instructions:  No changes *If you need a refill on your cardiac medications before your next appointment, please call your pharmacy*   Lab Work: None ordered If you have labs (blood work) drawn today and your tests are completely normal, you will receive your results only by: [place] (if you have MyChart) OR A paper copy in the mail If you have any lab test that is abnormal or we need to change your treatment, we will call you to review the results.   Testing/Procedures: None ordered   Follow-Up: At [facility], you and your health needs are our priority.  As part of our continuing mission to provide you with exceptional heart care, we have created designated Provider Care Teams.  These Care Teams include your primary Cardiologist (physician) and Advanced Practice Providers (APPs -  Physician Assistants and Nurse Practitioners) who all work together to provide you with the care you need, when you need it.  We recommend signing up for the patient portal called "MyChart".  Sign up information is provided on this After Visit Summary.  MyChart is used to connect with patients for Virtual Visits (Telemedicine).  Patients are able to view lab/test results, encounter notes, upcoming appointments, etc.  Non-urgent messages can be sent to your provider as well.   To learn more about what you can do with MyChart, go to [email or web address].    Your next appointment:   12 month(s)  The format for your next appointment:   In Person  Provider:   Sanda Klein, MD {    Important Information About Sugar           Signed, Sanda Klein, MD  02/03/2022 5:17 PM    [address], [place], [place]  [address] Phone: [phone]; Fax: [phone]

## 2022-01-30 MED ORDER — METOPROLOL TARTRATE 100 MG PO TABS: 100.0000 mg | ORAL_TABLET | Freq: Two times a day (BID) | ORAL | 3 refills | Status: DC

## 2022-01-30 MED ORDER — ISOSORBIDE MONONITRATE ER 30 MG PO TB24: ORAL_TABLET | ORAL | 3 refills | Status: DC

## 2022-01-31 ENCOUNTER — Encounter: Payer: Self-pay | Admitting: Cardiovascular Disease

## 2022-02-14 ENCOUNTER — Inpatient Hospital Stay: Payer: Medicare Other | Attending: Oncology | Admitting: Oncology

## 2022-02-14 VITALS — BP 128/64 | HR 68 | Temp 98.2°F | Resp 18 | Ht 69.5 in | Wt 192.6 lb

## 2022-02-14 DIAGNOSIS — Z8572 Personal history of non-Hodgkin lymphomas: Secondary | ICD-10-CM | POA: Insufficient documentation

## 2022-02-14 DIAGNOSIS — Z9221 Personal history of antineoplastic chemotherapy: Secondary | ICD-10-CM | POA: Insufficient documentation

## 2022-02-14 DIAGNOSIS — C8208 Follicular lymphoma grade I, lymph nodes of multiple sites: Secondary | ICD-10-CM

## 2022-02-14 NOTE — Progress Notes (Signed)
  David [name] OFFICE PROGRESS NOTE   Diagnosis: Non-Hodgkin's lymphoma  INTERVAL HISTORY:   David [name] returns as scheduled.  No fever or night sweats.  He reports intentional weight loss.  Good appetite.  No palpable lymph nodes.  He has leg numbness with exercise secondary to spinal stenosis.  He continues follow-up with David [name] for the urothelial carcinoma.  Objective:  Vital signs in last 24 hours:  Blood pressure 128/64, pulse 68, temperature 98.2 F (36.8 C), temperature source Oral, resp. rate 18, height 5' 9.5" (1.765 m), weight 192 lb 9.6 oz (87.4 kg), SpO2 98 %.    HEENT: Neck without mass, no evidence of recurrent tumor at the left preauricular region, oropharynx without visible mass Lymphatics: No cervical, supraclavicular, axillary, or inguinal nodes Resp: Lungs clear bilaterally Cardio: Regular rate and rhythm GI: No mass, no hepatosplenomegaly, nontender Vascular: No leg edema   Lab Results:  Lab Results  Component Value Date   WBC 6.9 07/15/2021   HGB 13.5 07/15/2021   HCT 41.5 07/15/2021   MCV 95.4 07/15/2021   PLT 197 07/15/2021   NEUTROABS 5.0 07/15/2021    CMP  Lab Results  Component Value Date   NA 135 07/15/2021   K 4.6 07/15/2021   CL 106 07/15/2021   CO2 22 07/15/2021   GLUCOSE 106 (H) 07/15/2021   BUN 18 07/15/2021   CREATININE 1.22 07/15/2021   CALCIUM 8.8 (L) 07/15/2021   PROT 6.9 07/15/2021   ALBUMIN 4.1 07/15/2021   AST 30 07/15/2021   ALT 34 07/15/2021   ALKPHOS 46 07/15/2021   BILITOT 0.9 07/15/2021   GFRNONAA 58 (L) 07/15/2021   GFRAA >60 05/07/2016     Medications: I have reviewed the patient's current medications.   Assessment/Plan: Non-Hodgkin's lymphoma, follicular B-cell, low grade, involving a left cervical lymph node with a staging evaluation revealing evidence for advanced stage disease. There was cervical, mediastinal, and abdominal lymphadenopathy and a question of splenic involvement on staging  scans. He completed 4 cycles of fludarabine/rituximab with resolution of the palpable lymphadenopathy. The last chemotherapy was given in June 2011.   Skin rash following cycle #1 of fludarabine/rituximab, likely related to allopurinol or Bactrim. The Bactrim prophylaxis was discontinued. History of Chronic Thrombocytopenia secondary chemotherapy and non-Hodgkin's lymphoma-normal today History of neutropenia secondary to chemotherapy. Injection site reaction to a pneumococcal vaccine in October 2011. History of coronary artery disease and peripheral vascular disease, followed by cardiology papillary urothelial carcinoma, status post transurethral resection and intravesical mitomycin-C 04/13/2016 Spinal stenosis     Disposition: David [name] remains in clinical remission from non-Hodgkin's lymphoma.  He would like to continue follow-up at the Cancer center.  He will return for an office visit in 1 year.  Betsy Coder, MD  02/14/2022  9:26 AM

## 2022-02-15 ENCOUNTER — Ambulatory Visit (HOSPITAL_COMMUNITY)
Admission: RE | Admit: 2022-02-15 | Discharge: 2022-02-15 | Disposition: A | Discharge status: Home / Self Care | Payer: Medicare Other | Source: Ambulatory Visit | Attending: Cardiology | Admitting: Cardiology

## 2022-02-15 DIAGNOSIS — Z95820 Peripheral vascular angioplasty status with implants and grafts: Secondary | ICD-10-CM | POA: Diagnosis not present

## 2022-03-01 ENCOUNTER — Other Ambulatory Visit: Payer: Self-pay | Admitting: Cardiovascular Disease

## 2022-04-03 ENCOUNTER — Ambulatory Visit (INDEPENDENT_AMBULATORY_CARE_PROVIDER_SITE_OTHER): Payer: Medicare Other

## 2022-04-03 DIAGNOSIS — I442 Atrioventricular block, complete: Secondary | ICD-10-CM | POA: Diagnosis not present

## 2022-04-03 LAB — CUP PACEART REMOTE DEVICE CHECK
Battery Remaining Longevity: 84 mo
Battery Remaining Percentage: 82 %
Brady Statistic RA Percent Paced: 79 %
Brady Statistic RV Percent Paced: 99 %
Date Time Interrogation Session: 20230829022900
Implantable Lead Implant Date: 20170425
Implantable Lead Implant Date: 20170425
Implantable Lead Location: 753859
Implantable Lead Location: 753860
Implantable Lead Model: 7741
Implantable Lead Model: 7742
Implantable Lead Serial Number: 720495
Implantable Lead Serial Number: 763888
Implantable Pulse Generator Implant Date: 20170425
Lead Channel Impedance Value: 732 Ohm
Lead Channel Impedance Value: 822 Ohm
Lead Channel Pacing Threshold Amplitude: 0.7 V
Lead Channel Pacing Threshold Pulse Width: 0.4 ms
Lead Channel Setting Pacing Amplitude: 1.1 V
Lead Channel Setting Pacing Amplitude: 2.5 V
Lead Channel Setting Pacing Pulse Width: 0.4 ms
Lead Channel Setting Sensing Sensitivity: 2.5 mV
Pulse Gen Serial Number: 718025

## 2022-04-12 DIAGNOSIS — Z23 Encounter for immunization: Secondary | ICD-10-CM | POA: Diagnosis not present

## 2022-04-23 DIAGNOSIS — H40053 Ocular hypertension, bilateral: Secondary | ICD-10-CM | POA: Diagnosis not present

## 2022-04-23 DIAGNOSIS — Z961 Presence of intraocular lens: Secondary | ICD-10-CM | POA: Diagnosis not present

## 2022-04-23 DIAGNOSIS — H5201 Hypermetropia, right eye: Secondary | ICD-10-CM | POA: Diagnosis not present

## 2022-04-27 NOTE — Progress Notes (Signed)
Remote pacemaker transmission.   

## 2022-05-17 ENCOUNTER — Other Ambulatory Visit: Payer: Self-pay | Admitting: Cardiovascular Disease

## 2022-05-21 ENCOUNTER — Encounter: Payer: Self-pay | Admitting: Cardiovascular Disease

## 2022-05-21 MED ORDER — ISOSORBIDE MONONITRATE ER 60 MG PO TB24: ORAL_TABLET | ORAL | 11 refills | Status: DC

## 2022-05-21 MED ORDER — ISOSORBIDE MONONITRATE ER 60 MG PO TB24: ORAL_TABLET | ORAL | 3 refills | Status: DC

## 2022-05-21 NOTE — Telephone Encounter (Signed)
Please increase the isosorbide to 60 mg twice daily. Please ask if the discomfort occurs more during any particular time of day.

## 2022-05-21 NOTE — Telephone Encounter (Signed)
Patient stated his mid chest tightness occurs more frequently and with less exertion as compared to before. He denies sob, lightheadedness, or dizziness with the tightness. Recommended that if he has any of the abvoe mentioned with the chest tightness, he needs to be taken to the ED. Patient wants to know if he should increase Imdur. He takes Imdur '30mg'$  in the am and '60mg'$  in the PM.

## 2022-06-11 DIAGNOSIS — C672 Malignant neoplasm of lateral wall of bladder: Secondary | ICD-10-CM | POA: Diagnosis not present

## 2022-06-21 ENCOUNTER — Other Ambulatory Visit: Payer: Self-pay | Admitting: Cardiovascular Disease

## 2022-06-26 DIAGNOSIS — Z23 Encounter for immunization: Secondary | ICD-10-CM | POA: Diagnosis not present

## 2022-07-03 ENCOUNTER — Ambulatory Visit (INDEPENDENT_AMBULATORY_CARE_PROVIDER_SITE_OTHER): Payer: Medicare Other

## 2022-07-03 DIAGNOSIS — I442 Atrioventricular block, complete: Secondary | ICD-10-CM

## 2022-07-03 LAB — CUP PACEART REMOTE DEVICE CHECK
Battery Remaining Longevity: 78 mo
Battery Remaining Percentage: 80 %
Brady Statistic RA Percent Paced: 79 %
Brady Statistic RV Percent Paced: 99 %
Date Time Interrogation Session: 20231128022100
Implantable Lead Connection Status: 753985
Implantable Lead Connection Status: 753985
Implantable Lead Implant Date: 20170425
Implantable Lead Implant Date: 20170425
Implantable Lead Location: 753859
Implantable Lead Location: 753860
Implantable Lead Model: 7741
Implantable Lead Model: 7742
Implantable Lead Serial Number: 720495
Implantable Lead Serial Number: 763888
Implantable Pulse Generator Implant Date: 20170425
Lead Channel Impedance Value: 739 Ohm
Lead Channel Impedance Value: 848 Ohm
Lead Channel Pacing Threshold Amplitude: 0.6 V
Lead Channel Pacing Threshold Pulse Width: 0.4 ms
Lead Channel Setting Pacing Amplitude: 1.1 V
Lead Channel Setting Pacing Amplitude: 2.5 V
Lead Channel Setting Pacing Pulse Width: 0.4 ms
Lead Channel Setting Sensing Sensitivity: 2.5 mV
Pulse Gen Serial Number: 718025
Zone Setting Status: 755011

## 2022-07-11 ENCOUNTER — Telehealth: Payer: Self-pay | Admitting: Cardiovascular Disease

## 2022-07-11 MED ORDER — ICOSAPENT ETHYL 1 G PO CAPS: 2.0000 g | ORAL_CAPSULE | Freq: Two times a day (BID) | ORAL | 3 refills | Status: DC

## 2022-07-11 NOTE — Telephone Encounter (Signed)
*  STAT* If patient is at the pharmacy, call can be transferred to refill team.   1. Which medications need to be refilled? (please list name of each medication and dose if known) icosapent Ethyl (VASCEPA) 1 g capsule  2. Which pharmacy/location (including street and city if local pharmacy) is medication to be sent to? [facility] # [place], [place]   3. Do they need a 30 day or 90 day supply? [place]

## 2022-08-03 NOTE — Progress Notes (Signed)
Remote pacemaker transmission.   

## 2022-08-09 DIAGNOSIS — Z950 Presence of cardiac pacemaker: Secondary | ICD-10-CM | POA: Diagnosis not present

## 2022-08-09 DIAGNOSIS — N183 Chronic kidney disease, stage 3 unspecified: Secondary | ICD-10-CM | POA: Diagnosis not present

## 2022-08-09 DIAGNOSIS — Z8551 Personal history of malignant neoplasm of bladder: Secondary | ICD-10-CM | POA: Diagnosis not present

## 2022-08-09 DIAGNOSIS — I119 Hypertensive heart disease without heart failure: Secondary | ICD-10-CM | POA: Diagnosis not present

## 2022-08-09 DIAGNOSIS — K219 Gastro-esophageal reflux disease without esophagitis: Secondary | ICD-10-CM | POA: Diagnosis not present

## 2022-08-09 DIAGNOSIS — Z0000 Encounter for general adult medical examination without abnormal findings: Secondary | ICD-10-CM | POA: Diagnosis not present

## 2022-08-09 DIAGNOSIS — M4800 Spinal stenosis, site unspecified: Secondary | ICD-10-CM | POA: Diagnosis not present

## 2022-08-09 DIAGNOSIS — I25119 Atherosclerotic heart disease of native coronary artery with unspecified angina pectoris: Secondary | ICD-10-CM | POA: Diagnosis not present

## 2022-08-09 DIAGNOSIS — E782 Mixed hyperlipidemia: Secondary | ICD-10-CM | POA: Diagnosis not present

## 2022-08-09 DIAGNOSIS — C8590 Non-Hodgkin lymphoma, unspecified, unspecified site: Secondary | ICD-10-CM | POA: Diagnosis not present

## 2022-08-09 DIAGNOSIS — I442 Atrioventricular block, complete: Secondary | ICD-10-CM | POA: Diagnosis not present

## 2022-08-09 DIAGNOSIS — I739 Peripheral vascular disease, unspecified: Secondary | ICD-10-CM | POA: Diagnosis not present

## 2022-09-10 ENCOUNTER — Ambulatory Visit (HOSPITAL_COMMUNITY)
Admission: RE | Admit: 2022-09-10 | Discharge: 2022-09-10 | Disposition: A | Discharge status: Home / Self Care | Payer: Medicare Other | Source: Ambulatory Visit | Attending: Cardiology | Admitting: Cardiology

## 2022-09-10 DIAGNOSIS — I6523 Occlusion and stenosis of bilateral carotid arteries: Secondary | ICD-10-CM

## 2022-10-02 ENCOUNTER — Ambulatory Visit: Payer: Medicare Other

## 2022-10-02 DIAGNOSIS — I255 Ischemic cardiomyopathy: Secondary | ICD-10-CM | POA: Diagnosis not present

## 2022-10-02 LAB — CUP PACEART REMOTE DEVICE CHECK
Battery Remaining Longevity: 78 mo
Battery Remaining Percentage: 76 %
Brady Statistic RA Percent Paced: 79 %
Brady Statistic RV Percent Paced: 99 %
Date Time Interrogation Session: 20240227022100
Implantable Lead Connection Status: 753985
Implantable Lead Connection Status: 753985
Implantable Lead Implant Date: 20170425
Implantable Lead Implant Date: 20170425
Implantable Lead Location: 753859
Implantable Lead Location: 753860
Implantable Lead Model: 7741
Implantable Lead Model: 7742
Implantable Lead Serial Number: 720495
Implantable Lead Serial Number: 763888
Implantable Pulse Generator Implant Date: 20170425
Lead Channel Impedance Value: 738 Ohm
Lead Channel Impedance Value: 846 Ohm
Lead Channel Pacing Threshold Amplitude: 0.6 V
Lead Channel Pacing Threshold Pulse Width: 0.4 ms
Lead Channel Setting Pacing Amplitude: 1.1 V
Lead Channel Setting Pacing Amplitude: 2.5 V
Lead Channel Setting Pacing Pulse Width: 0.4 ms
Lead Channel Setting Sensing Sensitivity: 2.5 mV
Pulse Gen Serial Number: 718025
Zone Setting Status: 755011

## 2022-11-07 NOTE — Progress Notes (Signed)
Remote pacemaker transmission.   

## 2022-11-26 ENCOUNTER — Telehealth: Payer: Self-pay | Admitting: Cardiovascular Disease

## 2022-11-26 DIAGNOSIS — I209 Angina pectoris, unspecified: Secondary | ICD-10-CM

## 2022-11-26 NOTE — Telephone Encounter (Signed)
Spoke with patient and he is not currently having chest pain. He state its more like tightness that comes and goes for the last couple of months but has been happening more frequently.  Talking on the phone I can hear he was short of breath but he stated that was because he had to walk up the stairs to answer the telephone. He also states he has shortness of breath with exertion. He took one nitroglycerin today and he stated it helped with the chest tightness but he does not see the difference if he just sat still and rested for a minute.  It is time for is 1 year follow up but [facility] is booked out until July. Scheduling does have him down for appointment tomorrow with Steward Ros NP.     Patient was no longer short of breath when we hung up the phone and is currently asymptomatic.

## 2022-11-26 NOTE — Telephone Encounter (Signed)
No answer from triage, please return call when able.

## 2022-11-26 NOTE — Telephone Encounter (Signed)
Pt c/o of Chest Pain: STAT if CP now or developed within 24 hours  1. Are you having CP right now?  A little, but he states he took nitro and it is helping  2. Are you experiencing any other symptoms (ex. SOB, nausea, vomiting, sweating)?  No   3. How long have you been experiencing CP?  For the past few months   4. Is your CP continuous or coming and going?  Coming and going   5. Have you taken Nitroglycerin?    Yes

## 2022-11-27 ENCOUNTER — Encounter: Payer: Self-pay | Admitting: Nurse Practitioner

## 2022-11-27 ENCOUNTER — Ambulatory Visit: Payer: Medicare Other | Attending: Nurse Practitioner | Admitting: Nurse Practitioner

## 2022-11-27 VITALS — BP 122/62 | HR 69 | Ht 69.5 in | Wt 194.2 lb

## 2022-11-27 DIAGNOSIS — I442 Atrioventricular block, complete: Secondary | ICD-10-CM | POA: Insufficient documentation

## 2022-11-27 DIAGNOSIS — I2089 Other forms of angina pectoris: Secondary | ICD-10-CM | POA: Insufficient documentation

## 2022-11-27 DIAGNOSIS — I25810 Atherosclerosis of coronary artery bypass graft(s) without angina pectoris: Secondary | ICD-10-CM | POA: Diagnosis not present

## 2022-11-27 DIAGNOSIS — I10 Essential (primary) hypertension: Secondary | ICD-10-CM | POA: Insufficient documentation

## 2022-11-27 DIAGNOSIS — I739 Peripheral vascular disease, unspecified: Secondary | ICD-10-CM | POA: Insufficient documentation

## 2022-11-27 MED ORDER — NITROGLYCERIN 0.4 MG SL SUBL: 0.4000 mg | SUBLINGUAL_TABLET | SUBLINGUAL | 3 refills | Status: DC | PRN

## 2022-11-27 MED ORDER — ISOSORBIDE MONONITRATE ER 30 MG PO TB24: ORAL_TABLET | ORAL | 2 refills | Status: DC

## 2022-11-27 NOTE — Progress Notes (Signed)
Office Visit    Patient Name: [name] Date of Encounter: 11/27/2022  Primary Care Provider:  Lupita Raider, MD Primary Cardiologist:  Thurmon Fair, MD Primary Electrophysiologist: None   Past Medical History    Past Medical History:  Diagnosis Date   Arthritis    "hands" (10/19/2013)   Bilateral carotid bruits 04/08/2020   Right greater than left   Bladder tumor    CAD (coronary artery disease)    cardiologist-  dr crouitoru   Carotid artery disease (HCC)    moderate pRICA 60-79%,  1-39% LICA , >50% RECA last duplex 08-05-2015   Cataract of both eyes    Complete heart block (HCC)    GERD (gastroesophageal reflux disease)    Glaucoma, both eyes    Hematuria    History of MI (myocardial infarction)    1989   Hyperlipidemia    Hypertension    Ischemic cardiomyopathy    ef 40-45%  without clinical heart failure per cardiology note 12-28-2015 (nuclear stress ef 43% from 07-28-2015)   Left ventricular dysfunction    Non Hodgkin's lymphoma (HCC) dx 06/2010---  oncologist-  dr Truett Perna---  in clinical remission since 2013   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 -- lymphadenopathy to neck, mediastinal , and abdominal--  chemotherapy completed 06/ 2011   NSVT (nonsustained ventricular tachycardia) [facility]) cardiologist-  dr crouitoru   Max rate 200 bpm, 28-beat, nearly 9-second episode recorded by pacemaker on November 15, asymptomatic    Pacemaker pacemaker dependant due to CHB   1st Pacemaker Placement 1998 w/ New generator  12-31-2000 ; 08-04-2008;  11-29-2015   PAD (peripheral artery disease) (HCC)    followed by dr berry--- last duplex 08-05-2015  patents bilateral popliteal stents, bilateral SFA 30-49% stenosis   Peripheral vascular disease with claudication (HCC)    dx 2009 left popliteal occlusion with collaterals; 03- 2015  s/p  stenting right popliteal  by dr berry and left popliteal stenting by dr Pernell Dupre ([facility]) july 2015   S/P CABG  x 5    09-10-1988   S/P drug eluting coronary stent placement 01/04/2006   01-04-2006--- DES to SVG to OM1, OM2, and IR, and SVG to PDA(x2 grafts)  and 03-04-2013  DES to SVR to OM   Wears glasses    Past Surgical History:  Procedure Laterality Date   ANAL FISSURE REPAIR  12-09-2000 and 1970's   CARDIAC CATHETERIZATION  04/09/2000   patent grafts   CARDIAC PACEMAKER PLACEMENT  11-15-1993   dr Laneta Simmers   CARDIOVASCULAR STRESS TEST  07-28-2015  dr crouitoru   Intermediate risk nuclear study w/ large, severe, predominantly fixed inferior and apical defect; minimal reversibility inferior lateral wall; findings consistent w/ large prior infarct and trivial peri-infarct ischemia;  ef 43% w/ inferior hypokinesis and apical akinesis; mild LVE   CORONARY ANGIOPLASTY WITH STENT PLACEMENT  01-04-2006  dr gamble   DES to pSVG to OM1, SVG to OM2 &  IR, and mSVG to RCA dPDA (x2 grafts );  pLAD , pLCFX and  mRCA occluded;  LIMA to LAD mid-distal patent with occlusion beyond LIMA with collateral flow;  ef 30%   CORONARY ARTERY BYPASS GRAFT  09/10/1988   dr Andrey Campanile   "CABG X5" (03/04/2013) LIMA to LAD,SVG to imtermediate OM1 OM II,SVG to PDA   CYSTOSCOPY W/ RETROGRADES Bilateral 04/13/2016   Procedure: CYSTOSCOPY WITH RETROGRADE PYELOGRAM;  Surgeon: Sebastian Ache, MD;  Location: [facility];  Service:  Urology;  Laterality: Bilateral;   EP IMPLANTABLE DEVICE N/A 11/29/2015   Procedure: Pacemaker Implant;  Surgeon: Thurmon Fair, MD;  Location: [facility] INVASIVE CV LAB;  Service: Cardiovascular;  Laterality: N/A;  new generator and new atrial, vertricular leads;  [employer] Accolade   LEFT HEART CATHETERIZATION WITH CORONARY/GRAFT ANGIOGRAM N/A 03/04/2013   Procedure: LEFT HEART CATHETERIZATION WITH Isabel Caprice;  Surgeon: Lennette Bihari, MD;  Location: [facility] CATH LAB;  Service: Cardiovascular;  Laterality: N/A;   LOWER EXTREMITY ANGIOGRAM N/A 08/31/2013   Procedure: LOWER EXTREMITY ANGIOGRAM;   Surgeon: Runell Gess, MD;  Location: [facility] CATH LAB;  Service: Cardiovascular;  Laterality: N/A;   PACEMAKER REPLACEMENT  12/31/2000;  08-04-2008   PERCUTANEOUS CORONARY STENT INTERVENTION (PCI-S)  03/04/2013   Procedure: PERCUTANEOUS CORONARY STENT INTERVENTION (PCI-S);  Surgeon: Lennette Bihari, MD;  Location: [facility] CATH LAB;  Service: Cardiovascular;;  DES to S-OM for in-stent restenosis   POPLITEAL ARTERY STENT Right 10-21-2013  dr berry   atherectomy, PTA , and IDEV stent x1   POPLITEAL ARTERY STENT Left 07/ 2015  dr Pernell Dupre at [facility]   angioplasty / stent x2   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   SUPERFICIAL LYMPH NODE BIOPSY / EXCISION Left 2011   cervical lymph node   TRANSURETHRAL RESECTION OF BLADDER TUMOR N/A 04/13/2016   Procedure: TRANSURETHRAL RESECTION OF BLADDER TUMOR (TURBT) with clot evacuation;  Surgeon: Sebastian Ache, MD;  Location: [facility];  Service: Urology;  Laterality: N/A;   VENOGRAM Left 11/29/2015   Procedure: Venogram;  Surgeon: Thurmon Fair, MD;  Location: [facility] INVASIVE CV LAB;  Service: Cardiovascular;  Laterality: Left;    Allergies  Allergies  Allergen Reactions   Azithromycin Diarrhea   Lactose Intolerance (Gi) Diarrhea   Niacin And Related Diarrhea   Sulfa Antibiotics Rash     History of Present Illness    David [name]  is a 89 year old male with a PMH of CAD s/p CABG 1990 with 3 subsequent stents placed most recently 02/2013 with DES to obtuse marginal vein, CHB s/p PPM 1995 [employer], PAD s/p right SFA diamondback orbital rotational atherectomy, right internal carotid artery stenosis, non-Hodgkin's lymphoma in remission since 2013, NSVT 2015 who presents today for complaint of chest tightness with shortness of breath.  Mr.Wadas was last seen by Dr. [name] on 01/29/2022 for annual follow-up.  He was reportedly doing well with some exertional angina that resolved promptly with rest.  He denied any  shortness of breath with exertion.  He underwent most recent ischemic evaluation 01/2021 by nuclear stress test that showed reversible ischemia with known inferior and apical scar and EF of 36%.  2D echo was completed 02/2021 with EF of 45-50% which improved from previous echo.  He underwent a carotid ultrasound that showed mild progression of B ICA stenosis with plan to repeat in 12 months.  Patient contacted the triage line yesterday with complaint of chest tightness that comes and goes over the past couple of months.  He was noted to have some shortness of breath on the phone but patient reported walking upstairs prior to answering the phone.  He required 1 sublingual nitroglycerin that helped with his chest tightness but reports the same resolution with resting.    Mr. Mangel presents today for complaint of chest pressure.  Since last being seen in the office patient reports that he has noticed increased episodes of chest pressure with activity.  He was recently  helping his wife sprayed paint in his garage and he noticed chest pressure that was relieved with nitroglycerin x 1.  He also reports relief of pressure with resting.  He denies any discomfort at rest or associated dizziness or presyncope.  He is compliant with his medications and denies any adverse reactions.  His blood pressure today is well-controlled at 122/62 heart rate is 69 bpm.  He has been participating in more activities with the weather changing.  He notes possible 5-6 episodes of discomfort per week.  He is unable to do frequent activities due to claudication and spinal stenosis.  We discussed possibility of requiring additional ischemic evaluation with his increase and stable angina.  He underwent a stress test 01/2021 for his most recent evaluation.  Patient denies  palpitations, dyspnea, PND, orthopnea, nausea, vomiting, dizziness, syncope, edema, weight gain, or early satiety.   Home Medications    Current Outpatient Medications   Medication Sig Dispense Refill   aspirin EC 81 MG tablet Take 81 mg by mouth daily.     cilostazol (PLETAL) 100 MG tablet TAKE 1 TABLET BY MOUTH  TWICE DAILY 180 tablet 3   famotidine (PEPCID) 20 MG tablet Take 20 mg by mouth as needed.     icosapent Ethyl (VASCEPA) 1 g capsule Take 2 capsules (2 g total) by mouth 2 (two) times daily. 360 capsule 3   isosorbide mononitrate (IMDUR) 60 MG 24 hr tablet Take one tablet in the morning and take two tablets in the evening. 270 tablet 3   lisinopril (ZESTRIL) 20 MG tablet Take 1 tablet (20 mg total) by mouth daily. 90 tablet 3   metoprolol tartrate (LOPRESSOR) 100 MG tablet Take 1 tablet (100 mg total) by mouth 2 (two) times daily. 180 tablet 3   Multiple Vitamins-Minerals (SENIOR MULTIVITAMIN PLUS PO) Take 1 tablet by mouth daily.     rosuvastatin (CRESTOR) 40 MG tablet Take 1 tablet (40 mg total) by mouth at bedtime. 90 tablet 3   No current facility-administered medications for this visit.     Review of Systems  Please see the history of present illness.    (+) Chest pressure (+) Arthritic back pain, hand and wrist pain  All other systems reviewed and are otherwise negative except as noted above.  Physical Exam    Wt Readings from Last 3 Encounters:  02/14/22 192 lb 9.6 oz (87.4 kg)  01/29/22 193 lb 3.2 oz (87.6 kg)  10/31/21 192 lb (87.1 kg)   ZO:XWRUE were no vitals filed for this visit.,There is no height or weight on file to calculate BMI.  Constitutional:      Appearance: Healthy appearance. Not in distress.  Neck:     Vascular: JVD normal.  Pulmonary:     Effort: Pulmonary effort is normal.     Breath sounds: No wheezing. No rales. Diminished in the bases Cardiovascular:     Normal rate. Regular rhythm. Normal S1. Normal S2.      Murmurs: There is no murmur.  Edema:    Peripheral edema absent.  Abdominal:     Palpations: Abdomen is soft non tender. There is no hepatomegaly.  Skin:    General: Skin is warm and dry.   Neurological:     General: No focal deficit present.     Mental Status: Alert and oriented to person, place and time.     Cranial Nerves: Cranial nerves are intact.  EKG/LABS/ Recent Cardiac Studies    ECG personally reviewed by me today -  atrial sensed ventricular paced rhythm with rate of 69 bpm and no acute changes consistent with previous EKGs.  Cardiac Studies & Procedures     STRESS TESTS  MYOCARDIAL PERFUSION IMAGING 01/18/2021  Narrative  The left ventricular ejection fraction is moderately decreased (30-44%).  Nuclear stress EF: 36%.  There was no ST segment deviation noted during stress.  Defect 1: There is a medium defect of severe severity present in the basal inferior, mid inferior, apical inferior, apical lateral and apex location.  Findings consistent with prior myocardial infarction.  This is an intermediate risk study due to reduced systolic function. There is no ischemia.   ECHOCARDIOGRAM  ECHOCARDIOGRAM COMPLETE 02/23/2021  Narrative ECHOCARDIOGRAM REPORT    Patient Name:   LULA MICHAUX Date of Exam: 02/23/2021 Medical Rec #:  [id number]     Height:       69.0 in Accession #:    [id number]    Weight:       199.8 lb Date of Birth:  Jun 11, 1933     BSA:          2.065 m Patient Age:    88 years      BP:           136/70 mmHg Patient Gender: M             HR:           70 bpm. Exam Location:  [address]  Procedure: 2D Echo, Cardiac Doppler, Color Doppler and Intracardiac Opacification Agent  Indications:    I25.5 Ischemic cardiomyopathy  History:        Patient has prior history of Echocardiogram examinations, most recent 11/04/2012. Cardiomyopathy, CAD, Pacemaker and Prior CABG, PVD, Carotid Disease and PAD, Arrythmias:Complete heart block. NSVT; Risk Factors:Hypertension and Former Smoker. Non-Hodgkin's lymphoma.  Sonographer:    Garald Braver, RDCS Referring Phys: [id number] Rudean Haskell [facility]   Sonographer Comments: Technically difficult  study due to poor echo windows and suboptimal apical window. IMPRESSIONS   1. Apical akinesis with overall mild LV dysfunction. 2. Left ventricular ejection fraction, by estimation, is 45 to 50%. The left ventricle has mildly decreased function. The left ventricle demonstrates regional wall motion abnormalities (see scoring diagram/findings for description). The left ventricular internal cavity size was mildly dilated. Left ventricular diastolic parameters are consistent with Grade I diastolic dysfunction (impaired relaxation). Elevated left atrial pressure. 3. Right ventricular systolic function is normal. The right ventricular size is normal. There is normal pulmonary artery systolic pressure. 4. The mitral valve is normal in structure. Trivial mitral valve regurgitation. 5. The aortic valve is tricuspid. Aortic valve regurgitation is not visualized. Mild to moderate aortic valve sclerosis/calcification is present, without any evidence of aortic stenosis. 6. The inferior vena cava is normal in size with greater than 50% respiratory variability, suggesting right atrial pressure of 3 mmHg.  Comparison(s): 11/04/12 EF 40-45%. PA pressure .  FINDINGS Left Ventricle: Left ventricular ejection fraction, by estimation, is 45 to 50%. The left ventricle has mildly decreased function. The left ventricle demonstrates regional wall motion abnormalities. Definity contrast agent was given IV to delineate the left ventricular endocardial borders. The left ventricular internal cavity size was mildly dilated. There is no left ventricular hypertrophy. Left ventricular diastolic parameters are consistent with Grade I diastolic dysfunction (impaired relaxation). Elevated left atrial pressure.  Right Ventricle: The right ventricular size is normal.Right ventricular systolic function is normal. There is normal pulmonary artery systolic pressure. The tricuspid regurgitant velocity is 2.54 m/s,  and with an assumed  right atrial pressure of 3 mmHg, the estimated right ventricular systolic pressure is 28.8 mmHg.  Left Atrium: Left atrial size was normal in size.  Right Atrium: Right atrial size was normal in size.  Pericardium: There is no evidence of pericardial effusion.  Mitral Valve: The mitral valve is normal in structure. Trivial mitral valve regurgitation.  Tricuspid Valve: The tricuspid valve is normal in structure. Tricuspid valve regurgitation is trivial. No evidence of tricuspid stenosis.  Aortic Valve: The aortic valve is tricuspid. Aortic valve regurgitation is not visualized. Mild to moderate aortic valve sclerosis/calcification is present, without any evidence of aortic stenosis.  Pulmonic Valve: The pulmonic valve was normal in structure. Pulmonic valve regurgitation is not visualized. No evidence of pulmonic stenosis.  Aorta: The aortic root is normal in size and structure.  Venous: The inferior vena cava is normal in size with greater than 50% respiratory variability, suggesting right atrial pressure of 3 mmHg.  IAS/Shunts: No atrial level shunt detected by color flow Doppler.  Additional Comments: Apical akinesis with overall mild LV dysfunction. A device lead is visualized.   LEFT VENTRICLE PLAX 2D LVIDd:         5.70 cm  Diastology LVIDs:         4.20 cm  LV e' medial:    4.33 cm/s LV PW:         1.10 cm  LV E/e' medial:  19.8 LV IVS:        1.00 cm  LV e' lateral:   7.08 cm/s LVOT diam:     2.40 cm  LV E/e' lateral: 12.1 LV SV:         118 LV SV Index:   57 LVOT Area:     4.52 cm   RIGHT VENTRICLE RV Basal diam:  4.30 cm RV S prime:     8.66 cm/s TAPSE (M-mode): 1.6 cm RVSP:           28.8 mmHg  LEFT ATRIUM             Index       RIGHT ATRIUM           Index LA diam:        5.10 cm 2.47 cm/m  RA Pressure: 3.00 mmHg LA Vol (A2C):   71.2 ml 34.48 ml/m RA Area:     15.30 cm LA Vol (A4C):   37.7 ml 18.26 ml/m RA Volume:   33.60 ml  16.27 ml/m LA Biplane  Vol: 52.0 ml 25.18 ml/m AORTIC VALVE LVOT Vmax:   142.00 cm/s LVOT Vmean:  86.900 cm/s LVOT VTI:    0.261 m  AORTA Ao Root diam: 3.20 cm Ao Asc diam:  3.10 cm  MITRAL VALVE               TRICUSPID VALVE TR Peak grad:   25.8 mmHg TR Vmax:        254.00 cm/s MV E velocity: 85.90 cm/s  Estimated RAP:  3.00 mmHg MV A velocity: 99.90 cm/s  RVSP:           28.8 mmHg MV E/A ratio:  0.86 SHUNTS Systemic VTI:  0.26 m Systemic Diam: 2.40 cm  Olga Millers MD Electronically signed by Olga Millers MD Signature Date/Time: 02/23/2021/1:39:28 PM    Final    MONITORS  CARDIAC EVENT MONITOR 07/25/2021           Lab Results  Component Value Date   WBC 6.9  07/15/2021   HGB 13.5 07/15/2021   HCT 41.5 07/15/2021   MCV 95.4 07/15/2021   PLT 197 07/15/2021   Lab Results  Component Value Date   CREATININE 1.22 07/15/2021   BUN 18 07/15/2021   NA 135 07/15/2021   K 4.6 07/15/2021   CL 106 07/15/2021   CO2 22 07/15/2021   Lab Results  Component Value Date   ALT 34 07/15/2021   AST 30 07/15/2021   ALKPHOS 46 07/15/2021   BILITOT 0.9 07/15/2021   Lab Results  Component Value Date   CHOL 119 06/24/2014   HDL 35 (L) 06/24/2014   LDLCALC 47 06/24/2014   TRIG 185 (H) 06/24/2014   CHOLHDL 3.4 06/24/2014    No results found for: "HGBA1C"   Assessment & Plan    1.  Stable angina: -Patient reports episodes of stable CCS class II angina with increased exertion.  He required 1 nitroglycerin yesterday with resolution of symptoms.  He also notes resolution of symptoms with rest. -Today patient reports more frequent chest discomfort episodes since increasing physical activity with the warming weather. -We will have him take an additional 30 mg of Imdur in the morning for a total of 90 and 60 in the evening. -Continue GDMT with ASA 81 mg, metoprolol milligrams twice daily, Crestor 40 mg daily -He was advised to follow-up in the ED if he experiences worsening chest discomfort that  is not relieved with nitroglycerin and occurs at rest. -We will have patient complete PET stress test to rule out new ischemia  2.  History of coronary artery disease: -s/p CABG 1995, PCI to SVG-OM in 2007 and 2014 with CCS functional to angina with activity.  Most recent ischemic evaluation completed 01/2021 Lexiscan Myoview -We discussed the need to repeat ischemic evaluation possibly if discomfort and pain becomes worse and is more frequent with little to no activity -He denies any anginal events at rest and has been compliant with his current medications. -Continue GDMT with ASA 81 mg, metoprolol milligrams twice daily, Crestor 40 mg daily -ED precautions discussed as noted above.  3.  History of CHB: -s/p PPM placed 1998 with most recent GEN change out 11/2015 -Most recent histogram normal on Paceart report  4.PAD: -Today patient reports that his claudication symptoms are occurring less frequently. -Continue Pletal 100 mg twice daily -Patient's was last seen by Dr. Gery Pray on 10/23/2021 with Dopplers revealing widely patent right SFA stent and occluded left popliteal stent   5.  Essential hypertension: -Patient's blood pressure today was well-controlled at 122/62 -Continue metoprolol 100 mg twice daily and Zestril 20 mg daily  6.  Hyperlipidemia: -Patient's last LDL cholesterol was 66 on 4/0/9811 -Continue Vascepa 1 g and Crestor 40 mg daily  Disposition: Follow-up with Thurmon Fair, MD as scheduled    Medication Adjustments/Labs and Tests Ordered: Current medicines are reviewed at length with the patient today.  Concerns regarding medicines are outlined above.   Signed, Napoleon Form, Leodis Rains, NP 11/27/2022, 7:31 AM [facility]

## 2022-11-27 NOTE — Patient Instructions (Addendum)
Medication Instructions:  START Nitroglycerin 0.4mg  Take 1 as needed for emergency chest pain. Take first dose for emergency chest pain; WAIT 5 minutes and if still having chest pain CALL 911 and then take 2nd dose. IF still having pain wait an additional 5 minutes before taking final dose. Do not take more than 3 doses in a day. INCREASE Imdur  Take  in the morning and  in the evening *If you need a refill on your cardiac medications before your next appointment, please call your pharmacy*   Lab Work: None ordered If you have labs (blood work) drawn today and your tests are completely normal, you will receive your results only by: MyChart Message (if you have MyChart) OR A paper copy in the mail If you have any lab test that is abnormal or we need to change your treatment, we will call you to review the results.   Testing/Procedures: None ordered   Follow-Up: At [facility], you and your health needs are our priority.  As part of our continuing mission to provide you with exceptional heart care, we have created designated Provider Care Teams.  These Care Teams include your primary Cardiologist (physician) and Advanced Practice Providers (APPs -  Physician Assistants and Nurse Practitioners) who all work together to provide you with the care you need, when you need it.  We recommend signing up for the patient portal called "MyChart".  Sign up information is provided on this After Visit Summary.  MyChart is used to connect with patients for Virtual Visits (Telemedicine).  Patients are able to view lab/test results, encounter notes, upcoming appointments, etc.  Non-urgent messages can be sent to your provider as well.   To learn more about what you can do with MyChart, go to [email or web address].    Your next appointment:   2-3 month(s)  Provider:   Thurmon Fair, MD     Other Instructions

## 2022-11-28 NOTE — Telephone Encounter (Signed)
Call placed to [name] to discuss completing cardiac PET stress test.  We discussed the risk and alternatives to the test and he was in agreement to proceed at this time.  We will contact him and schedule for the next available testing.  He had all questions answered to his satisfaction and thanked me for the call today.  Robin Searing, NP    Shared Decision Making/Informed Consent The risks [chest pain, shortness of breath, cardiac arrhythmias, dizziness, blood pressure fluctuations, myocardial infarction, stroke/transient ischemic attack, nausea, vomiting, allergic reaction, radiation exposure, metallic taste sensation and life-threatening complications (estimated to be 1 in 10,000)], benefits (risk stratification, diagnosing coronary artery disease, treatment guidance) and alternatives of a cardiac PET stress test were discussed in detail with David [name] and he agrees to proceed.

## 2022-11-29 ENCOUNTER — Other Ambulatory Visit: Payer: Self-pay

## 2022-11-29 DIAGNOSIS — I2089 Other forms of angina pectoris: Secondary | ICD-10-CM

## 2022-11-29 NOTE — Addendum Note (Signed)
Addended by: Elizabeth Palau on: 11/29/2022 03:38 PM   Modules accepted: Orders

## 2022-12-03 NOTE — Addendum Note (Signed)
Addended by: Alveta Heimlich on: 12/03/2022 07:18 AM   Modules accepted: Orders

## 2022-12-10 NOTE — Addendum Note (Signed)
Addended by: Thurmon Fair on: 12/10/2022 11:55 AM   Modules accepted: Orders

## 2022-12-24 ENCOUNTER — Other Ambulatory Visit: Payer: Self-pay | Admitting: Cardiovascular Disease

## 2022-12-25 ENCOUNTER — Other Ambulatory Visit (INDEPENDENT_AMBULATORY_CARE_PROVIDER_SITE_OTHER): Payer: Medicare Other

## 2022-12-25 ENCOUNTER — Ambulatory Visit (INDEPENDENT_AMBULATORY_CARE_PROVIDER_SITE_OTHER): Payer: Medicare Other | Admitting: Orthopaedic Surgery

## 2022-12-25 DIAGNOSIS — M25512 Pain in left shoulder: Secondary | ICD-10-CM | POA: Diagnosis not present

## 2022-12-25 DIAGNOSIS — G8929 Other chronic pain: Secondary | ICD-10-CM

## 2022-12-25 MED ORDER — BUPIVACAINE HCL 0.5 % IJ SOLN
3.0000 mL | INTRAMUSCULAR | Status: AC | PRN
Start: 2022-12-25 — End: 2022-12-25
  Administered 2022-12-25: 3 mL via INTRA_ARTICULAR

## 2022-12-25 MED ORDER — METHYLPREDNISOLONE ACETATE 40 MG/ML IJ SUSP
40.0000 mg | INTRAMUSCULAR | Status: AC | PRN
Start: 2022-12-25 — End: 2022-12-25
  Administered 2022-12-25: 40 mg via INTRA_ARTICULAR

## 2022-12-25 MED ORDER — LIDOCAINE HCL 1 % IJ SOLN
3.0000 mL | INTRAMUSCULAR | Status: AC | PRN
Start: 2022-12-25 — End: 2022-12-25
  Administered 2022-12-25: 3 mL

## 2022-12-25 NOTE — Progress Notes (Signed)
Office Visit Note   Patient: David [name]           Date of Birth: 1933-02-09           MRN: [id number] Visit Date: 12/25/2022              Requested by: Lupita Raider, MD [address] [place],  [place] [address] PCP: Lupita Raider, MD   Assessment & Plan: Visit Diagnoses:  1. Chronic left shoulder pain     Plan: Impression is a 89 year old gentleman with chronic left shoulder pain probable calcific tendinitis.  Disease process explained and treatment options were reviewed and we will try a subacromial injection and outpatient physical therapy.  Patient will return if symptoms do not improve in about 6 weeks.  Follow-Up Instructions: No follow-ups on file.   Orders:  Orders Placed This Encounter  Procedures   Large Joint Inj: L subacromial bursa   XR Shoulder Left   Ambulatory referral to Physical Therapy   No orders of the defined types were placed in this encounter.     Procedures: Large Joint Inj: L subacromial bursa on 12/25/2022 9:18 AM Indications: pain Details: 22 G needle  Arthrogram: No  Medications: 3 mL lidocaine 1 %; 3 mL bupivacaine 0.5 %; 40 mg methylPREDNISolone acetate 40 MG/ML Outcome: tolerated well, no immediate complications Patient was prepped and draped in the usual sterile fashion.       Clinical Data: No additional findings.   Subjective: Chief Complaint  Patient presents with   Left Shoulder - Pain    HPI David [name] is a very pleasant 89 year old gentleman who comes in for evaluation of left shoulder pain for several months.  Denies any injuries.  Has pain with range of motion and has noticed decreased strength.  Pain is to the lateral shoulder.  Denies any radicular symptoms.  Review of Systems  Constitutional: Negative.   HENT: Negative.    Eyes: Negative.   Respiratory: Negative.    Cardiovascular: Negative.   Gastrointestinal: Negative.   Endocrine: Negative.   Genitourinary: Negative.   Skin: Negative.    Allergic/Immunologic: Negative.   Neurological: Negative.   Hematological: Negative.   Psychiatric/Behavioral: Negative.    All other systems reviewed and are negative.    Objective: Vital Signs: There were no vitals taken for this visit.  Physical Exam Vitals and nursing note reviewed.  Constitutional:      Appearance: David [name] is well-developed.  Pulmonary:     Effort: Pulmonary effort is normal.  Abdominal:     Palpations: Abdomen is soft.  Skin:    General: Skin is warm.  Neurological:     Mental Status: David [name] is alert and oriented to person, place, and time.  Psychiatric:        Behavior: Behavior normal.        Thought Content: Thought content normal.        Judgment: Judgment normal.     Ortho Exam Examination of left shoulder shows good range of motion and strength with pain.  No significant tenderness to palpation. Specialty Comments:  No specialty comments available.  Imaging: XR Shoulder Left  Result Date: 12/25/2022 Left shoulder x-rays reflect calcification near the greater tuberosity.  No acute abnormalities.    PMFS History: Patient Active Problem List   Diagnosis Date Noted   Bilateral carotid bruits 04/08/2020   Acute pain of right shoulder 06/11/2018   CHB (complete heart block) (HCC) 11/29/2015   Pacemaker battery depletion 11/28/2015  Pacemaker lead malfunction 11/28/2015   Pacemaker 10/21/2015   NSVT (nonsustained ventricular tachycardia) (HCC) 06/24/2014   Pseudoaneurysm following procedure (HCC) 10/22/2013   Claudication in peripheral vascular disease (HCC) 10/19/2013   Hematoma of groin -right sided; w/p PV angio 09/11/2013   Claudication- unsuccessful PTA attempt Lt popliteal 08/31/13 08/31/2013   Asymptomatic stenosis of right carotid artery without infarction 06/14/2013   Unstable angina (HCC) 03/05/2013   CAD s/p CABG 1995, s/p stent SVG-OM 2007 and July 2014 03/05/2013   S/P angioplasty with stent SVG-OM 7/30./14 03/05/2013   Complete  heart block- [facility] pacemaker '95 with Gen change '02, '09. New leads 2017. Pacer dependednt 03/05/2013   PVD - Lt popliteal occlusion with collaterals '09; rt popliteal successful diamondback orbital rotational atherectomy PTA and stent using an IDEV stent 10/21/13 03/05/2013   Non Hodgkin's lymphoma- Feb 2011- radiation Rx 03/05/2013   Dyslipidemia 03/05/2013   HTN (hypertension) 03/05/2013   Cardiomyopathy, ischemic- EF 40-45% by echo 4/14 03/05/2013   Abnormal nuclear cardiac imaging test- high risk Myoview 02/13/13 03/05/2013   Past Medical History:  Diagnosis Date   Arthritis    "hands" (10/19/2013)   Bilateral carotid bruits 04/08/2020   Right greater than left   Bladder tumor    CAD (coronary artery disease)    cardiologist-  dr crouitoru   Carotid artery disease (HCC)    moderate pRICA 60-79%,  1-39% LICA , >50% RECA last duplex 08-05-2015   Cataract of both eyes    Complete heart block (HCC)    GERD (gastroesophageal reflux disease)    Glaucoma, both eyes    Hematuria    History of MI (myocardial infarction)    1989   Hyperlipidemia    Hypertension    Ischemic cardiomyopathy    ef 40-45%  without clinical heart failure per cardiology note 12-28-2015 (nuclear stress ef 43% from 07-28-2015)   Left ventricular dysfunction    Non Hodgkin's lymphoma (HCC) dx 06/2010---  oncologist-  dr Truett Perna---  in clinical remission since 2013   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 -- lymphadenopathy to neck, mediastinal , and abdominal--  chemotherapy completed 06/ 2011   NSVT (nonsustained ventricular tachycardia) [employer] [facility]) cardiologist-  dr crouitoru   Max rate 200 bpm, 28-beat, nearly 9-second episode recorded by pacemaker on November 15, asymptomatic    Pacemaker pacemaker dependant due to CHB   1st Pacemaker Placement 1998 w/ New generator  12-31-2000 ; 08-04-2008;  11-29-2015   PAD (peripheral artery disease) (HCC)    followed by dr berry--- last duplex 08-05-2015   patents bilateral popliteal stents, bilateral SFA 30-49% stenosis   Peripheral vascular disease with claudication (HCC)    dx 2009 left popliteal occlusion with collaterals; 03- 2015  s/p  stenting right popliteal  by dr berry and left popliteal stenting by dr Pernell Dupre ([facility]) july 2015   S/P CABG x 5    09-10-1988   S/P drug eluting coronary stent placement 01/04/2006   01-04-2006--- DES to SVG to OM1, OM2, and IR, and SVG to PDA(x2 grafts)  and 03-04-2013  DES to SVR to OM   Wears glasses     Family History  Problem Relation Age of Onset   Diabetes Mother    Stroke Mother    Heart attack Father    Hypertension Sister    Hyperlipidemia Sister    Hypertension Sister    Hyperlipidemia Sister     Past Surgical History:  Procedure Laterality Date   ANAL  FISSURE REPAIR  12-09-2000 and 1970's   CARDIAC CATHETERIZATION  04/09/2000   patent grafts   CARDIAC PACEMAKER PLACEMENT  11-15-1993   dr Laneta Simmers   CARDIOVASCULAR STRESS TEST  07-28-2015  dr crouitoru   Intermediate risk nuclear study w/ large, severe, predominantly fixed inferior and apical defect; minimal reversibility inferior lateral wall; findings consistent w/ large prior infarct and trivial peri-infarct ischemia;  ef 43% w/ inferior hypokinesis and apical akinesis; mild LVE   CORONARY ANGIOPLASTY WITH STENT PLACEMENT  01-04-2006  dr gamble   DES to pSVG to OM1, SVG to OM2 &  IR, and mSVG to RCA dPDA (x2 grafts );  pLAD , pLCFX and  mRCA occluded;  LIMA to LAD mid-distal patent with occlusion beyond LIMA with collateral flow;  ef 30%   CORONARY ARTERY BYPASS GRAFT  09/10/1988   dr Andrey Campanile   "CABG X5" (03/04/2013) LIMA to LAD,SVG to imtermediate OM1 OM II,SVG to PDA   CYSTOSCOPY W/ RETROGRADES Bilateral 04/13/2016   Procedure: CYSTOSCOPY WITH RETROGRADE PYELOGRAM;  Surgeon: Sebastian Ache, MD;  Location: [facility];  Service: Urology;  Laterality: Bilateral;   EP IMPLANTABLE DEVICE N/A 11/29/2015   Procedure: Pacemaker  Implant;  Surgeon: Thurmon Fair, MD;  Location: [facility];  Service: Cardiovascular;  Laterality: N/A;  new generator and new atrial, vertricular leads;  [employer]   LEFT HEART CATHETERIZATION WITH CORONARY/GRAFT ANGIOGRAM N/A 03/04/2013   Procedure: LEFT HEART CATHETERIZATION WITH Isabel Caprice;  Surgeon: Lennette Bihari, MD;  Location: [facility] CATH LAB;  Service: Cardiovascular;  Laterality: N/A;   LOWER EXTREMITY ANGIOGRAM N/A 08/31/2013   Procedure: LOWER EXTREMITY ANGIOGRAM;  Surgeon: Runell Gess, MD;  Location: [facility] CATH LAB;  Service: Cardiovascular;  Laterality: N/A;   PACEMAKER REPLACEMENT  12/31/2000;  08-04-2008   PERCUTANEOUS CORONARY STENT INTERVENTION (PCI-S)  03/04/2013   Procedure: PERCUTANEOUS CORONARY STENT INTERVENTION (PCI-S);  Surgeon: Lennette Bihari, MD;  Location: [facility] CATH LAB;  Service: Cardiovascular;;  DES to S-OM for in-stent restenosis   POPLITEAL ARTERY STENT Right 10-21-2013  dr berry   atherectomy, PTA , and IDEV stent x1   POPLITEAL ARTERY STENT Left 07/ 2015  dr Pernell Dupre at [facility]   angioplasty / stent x2   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   SUPERFICIAL LYMPH NODE BIOPSY / EXCISION Left 2011   cervical lymph node   TRANSURETHRAL RESECTION OF BLADDER TUMOR N/A 04/13/2016   Procedure: TRANSURETHRAL RESECTION OF BLADDER TUMOR (TURBT) with clot evacuation;  Surgeon: Sebastian Ache, MD;  Location: [facility];  Service: Urology;  Laterality: N/A;   VENOGRAM Left 11/29/2015   Procedure: Venogram;  Surgeon: Thurmon Fair, MD;  Location: [facility];  Service: Cardiovascular;  Laterality: Left;   Social History   Occupational History   Not on file  Tobacco Use   Smoking status: Former    Packs/day: 0.12    Years: 1.00    Additional pack years: 0.00    Total pack years: 0.12    Types: Cigarettes    Quit date: 08/06/1956    Years since quitting: 66.4   Smokeless tobacco: Never   Substance and Sexual Activity   Alcohol use: No   Drug use: No   Sexual activity: Never

## 2023-01-01 ENCOUNTER — Ambulatory Visit (INDEPENDENT_AMBULATORY_CARE_PROVIDER_SITE_OTHER): Payer: Medicare Other

## 2023-01-01 DIAGNOSIS — I442 Atrioventricular block, complete: Secondary | ICD-10-CM

## 2023-01-02 LAB — CUP PACEART REMOTE DEVICE CHECK
Battery Remaining Longevity: 72 mo
Battery Remaining Percentage: 73 %
Brady Statistic RA Percent Paced: 79 %
Brady Statistic RV Percent Paced: 99 %
Date Time Interrogation Session: 20240528022000
Implantable Lead Connection Status: 753985
Implantable Lead Connection Status: 753985
Implantable Lead Implant Date: 20170425
Implantable Lead Implant Date: 20170425
Implantable Lead Location: 753859
Implantable Lead Location: 753860
Implantable Lead Model: 7741
Implantable Lead Model: 7742
Implantable Lead Serial Number: 720495
Implantable Lead Serial Number: 763888
Implantable Pulse Generator Implant Date: 20170425
Lead Channel Impedance Value: 750 Ohm
Lead Channel Impedance Value: 860 Ohm
Lead Channel Pacing Threshold Amplitude: 0.6 V
Lead Channel Pacing Threshold Pulse Width: 0.4 ms
Lead Channel Setting Pacing Amplitude: 1.1 V
Lead Channel Setting Pacing Amplitude: 2.5 V
Lead Channel Setting Pacing Pulse Width: 0.4 ms
Lead Channel Setting Sensing Sensitivity: 2.5 mV
Pulse Gen Serial Number: 718025
Zone Setting Status: 755011

## 2023-01-04 NOTE — Therapy (Signed)
OUTPATIENT PHYSICAL THERAPY SHOULDER EVALUATION   Patient Name: David [name] MRN: [id number] DOB:1932-10-05, 89 y.o., male Today's Date: 01/07/2023  END OF SESSION:  PT End of Session - 01/07/23 1102     Visit Number 1    Number of Visits 13    Date for PT Re-Evaluation 02/18/23    Authorization Type Medicare    Progress Note Due on Visit 10    PT Start Time 1106    PT Stop Time 1141    PT Time Calculation (min) 35 min    Activity Tolerance Patient tolerated treatment well;No increased pain    Behavior During Therapy WFL for tasks assessed/performed             Past Medical History:  Diagnosis Date   Arthritis    "hands" (10/19/2013)   Bilateral carotid bruits 04/08/2020   Right greater than left   Bladder tumor    CAD (coronary artery disease)    cardiologist-  dr crouitoru   Carotid artery disease (HCC)    moderate pRICA 60-79%,  1-39% LICA , >50% RECA last duplex 08-05-2015   Cataract of both eyes    Complete heart block (HCC)    GERD (gastroesophageal reflux disease)    Glaucoma, both eyes    Hematuria    History of MI (myocardial infarction)    1989   Hyperlipidemia    Hypertension    Ischemic cardiomyopathy    ef 40-45%  without clinical heart failure per cardiology note 12-28-2015 (nuclear stress ef 43% from 07-28-2015)   Left ventricular dysfunction    Non Hodgkin's lymphoma (HCC) dx 06/2010---  oncologist-  dr Truett Perna---  in clinical remission since 2013   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 -- lymphadenopathy to neck, mediastinal , and abdominal--  chemotherapy completed 06/ 2011   NSVT (nonsustained ventricular tachycardia) [facility]) cardiologist-  dr crouitoru   Max rate 200 bpm, 28-beat, nearly 9-second episode recorded by pacemaker on November 15, asymptomatic    Pacemaker pacemaker dependant due to CHB   1st Pacemaker Placement 1998 w/ New generator  12-31-2000 ; 08-04-2008;  11-29-2015   PAD (peripheral artery disease) (HCC)     followed by dr berry--- last duplex 08-05-2015  patents bilateral popliteal stents, bilateral SFA 30-49% stenosis   Peripheral vascular disease with claudication (HCC)    dx 2009 left popliteal occlusion with collaterals; 03- 2015  s/p  stenting right popliteal  by dr berry and left popliteal stenting by dr Pernell Dupre ([facility]) july 2015   S/P CABG x 5    09-10-1988   S/P drug eluting coronary stent placement 01/04/2006   01-04-2006--- DES to SVG to OM1, OM2, and IR, and SVG to PDA(x2 grafts)  and 03-04-2013  DES to SVR to OM   Wears glasses    Past Surgical History:  Procedure Laterality Date   ANAL FISSURE REPAIR  12-09-2000 and 1970's   CARDIAC CATHETERIZATION  04/09/2000   patent grafts   CARDIAC PACEMAKER PLACEMENT  11-15-1993   dr Laneta Simmers   CARDIOVASCULAR STRESS TEST  07-28-2015  dr crouitoru   Intermediate risk nuclear study w/ large, severe, predominantly fixed inferior and apical defect; minimal reversibility inferior lateral wall; findings consistent w/ large prior infarct and trivial peri-infarct ischemia;  ef 43% w/ inferior hypokinesis and apical akinesis; mild LVE   CORONARY ANGIOPLASTY WITH STENT PLACEMENT  01-04-2006  dr gamble   DES to pSVG to OM1, SVG to OM2 &  IR, and  mSVG to RCA dPDA (x2 grafts );  pLAD , pLCFX and  mRCA occluded;  LIMA to LAD mid-distal patent with occlusion beyond LIMA with collateral flow;  ef 30%   CORONARY ARTERY BYPASS GRAFT  09/10/1988   dr Andrey Campanile   "CABG X5" (03/04/2013) LIMA to LAD,SVG to imtermediate OM1 OM II,SVG to PDA   CYSTOSCOPY W/ RETROGRADES Bilateral 04/13/2016   Procedure: CYSTOSCOPY WITH RETROGRADE PYELOGRAM;  Surgeon: Sebastian Ache, MD;  Location: [facility];  Service: Urology;  Laterality: Bilateral;   EP IMPLANTABLE DEVICE N/A 11/29/2015   Procedure: Pacemaker Implant;  Surgeon: Thurmon Fair, MD;  Location: [facility] INVASIVE CV LAB;  Service: Cardiovascular;  Laterality: N/A;  new generator and new atrial, vertricular leads;   [employer] Accolade   LEFT HEART CATHETERIZATION WITH CORONARY/GRAFT ANGIOGRAM N/A 03/04/2013   Procedure: LEFT HEART CATHETERIZATION WITH Isabel Caprice;  Surgeon: Lennette Bihari, MD;  Location: [facility] CATH LAB;  Service: Cardiovascular;  Laterality: N/A;   LOWER EXTREMITY ANGIOGRAM N/A 08/31/2013   Procedure: LOWER EXTREMITY ANGIOGRAM;  Surgeon: Runell Gess, MD;  Location: [facility] CATH LAB;  Service: Cardiovascular;  Laterality: N/A;   PACEMAKER REPLACEMENT  12/31/2000;  08-04-2008   PERCUTANEOUS CORONARY STENT INTERVENTION (PCI-S)  03/04/2013   Procedure: PERCUTANEOUS CORONARY STENT INTERVENTION (PCI-S);  Surgeon: Lennette Bihari, MD;  Location: [facility] CATH LAB;  Service: Cardiovascular;;  DES to S-OM for in-stent restenosis   POPLITEAL ARTERY STENT Right 10-21-2013  dr berry   atherectomy, PTA , and IDEV stent x1   POPLITEAL ARTERY STENT Left 07/ 2015  dr Pernell Dupre at [facility]   angioplasty / stent x2   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   SUPERFICIAL LYMPH NODE BIOPSY / EXCISION Left 2011   cervical lymph node   TRANSURETHRAL RESECTION OF BLADDER TUMOR N/A 04/13/2016   Procedure: TRANSURETHRAL RESECTION OF BLADDER TUMOR (TURBT) with clot evacuation;  Surgeon: Sebastian Ache, MD;  Location: [facility];  Service: Urology;  Laterality: N/A;   VENOGRAM Left 11/29/2015   Procedure: Venogram;  Surgeon: Thurmon Fair, MD;  Location: [facility] INVASIVE CV LAB;  Service: Cardiovascular;  Laterality: Left;   Patient Active Problem List   Diagnosis Date Noted   Bilateral carotid bruits 04/08/2020   Acute pain of right shoulder 06/11/2018   CHB (complete heart block) (HCC) 11/29/2015   Pacemaker battery depletion 11/28/2015   Pacemaker lead malfunction 11/28/2015   Pacemaker 10/21/2015   NSVT (nonsustained ventricular tachycardia) (HCC) 06/24/2014   Pseudoaneurysm following procedure (HCC) 10/22/2013   Claudication in peripheral vascular disease (HCC)  10/19/2013   Hematoma of groin -right sided; w/p PV angio 09/11/2013   Claudication- unsuccessful PTA attempt Lt popliteal 08/31/13 08/31/2013   Asymptomatic stenosis of right carotid artery without infarction 06/14/2013   Unstable angina (HCC) 03/05/2013   CAD s/p CABG 1995, s/p stent SVG-OM 2007 and July 2014 03/05/2013   S/P angioplasty with stent SVG-OM 7/30./14 03/05/2013   Complete heart block- [facility] pacemaker '95 with Gen change '02, '09. New leads 2017. Pacer dependednt 03/05/2013   PVD - Lt popliteal occlusion with collaterals '09; rt popliteal successful diamondback orbital rotational atherectomy PTA and stent using an IDEV stent 10/21/13 03/05/2013   Non Hodgkin's lymphoma- Feb 2011- radiation Rx 03/05/2013   Dyslipidemia 03/05/2013   HTN (hypertension) 03/05/2013   Cardiomyopathy, ischemic- EF 40-45% by echo 4/14 03/05/2013   Abnormal nuclear cardiac imaging test- high risk Myoview 02/13/13 03/05/2013    PCP: Lupita Raider, MD  REFERRING PROVIDER: Tarry Kos, MD  REFERRING DIAG: [phone] (ICD-10-CM) - Chronic left shoulder pain  THERAPY DIAG:  Left shoulder pain, unspecified chronicity  Abnormal posture  Muscle weakness (generalized)  Rationale for Evaluation and Treatment: Rehabilitation  ONSET DATE: 3 months   SUBJECTIVE:                                                                                                                                                                                      SUBJECTIVE STATEMENT: Gradually came on ~3 months ago, seems to be improving some. No MOI or changes in activity prior to onset. Saw ortho a couple weeks ago with cortisone injection. No N/T or red flag symptoms endorsed. States symptoms seem to be fairly stable, improve with cessation of provocative activity. Endorses stable/predictable exertional chest pain, states this does not occur with his shoulder pain and resolves with rest or nitro. Hand dominance:  Right  PERTINENT HISTORY: pacer dependent heart block, PVD, angina, CAD s/p CABG, HTN, arrhythmias, non hodgkins lymphoma s/p radiation  PAIN:  Are you having pain: none Location/description: L shoulder, lateral proximal to elbow  Best-worst over past week: 0-5/10  - aggravating factors: lifting, quick movements, lifting overhead (especially to side) - Easing factors: rest    PRECAUTIONS: pacemaker, cardiac hx, cancer hx, exertional chest pain (cardiology aware per pt)  WEIGHT BEARING RESTRICTIONS: No  FALLS:  Has patient fallen in last 6 months? No  LIVING ENVIRONMENT: W/ wife and daughter, 3 story, bed/bath are on second floor, tries to avoid stairs Family does majority of housework    OCCUPATION: Retired Financial controller, Insurance account manager  PLOF: Independent  PATIENT GOALS: less pain, be able to lift more and do his activities more freely, reach overhead (plate on top shelf)  NEXT MD VISIT: TBD/PRN, cardiology in July   OBJECTIVE:   DIAGNOSTIC FINDINGS:  12/25/22 L shoulder XR without acute abnormalities, refer to EPIC for details  PATIENT SURVEYS:  FOTO 59 current, 66 predicted in 10 visits  COGNITION: Overall cognitive status: Within functional limits for tasks assessed     SENSATION: WFL BIL UE   POSTURE: BIL shoulder IR, fwd head posture, increased kyphosis  UPPER EXTREMITY ROM:  A/PROM Right eval Left eval  Shoulder flexion 150 deg 140 deg pain  Shoulder abduction 115 deg 112 deg pain  Shoulder internal rotation    Shoulder external rotation (functional combo) C6 C6 painful   Elbow flexion    Elbow extension    Wrist flexion    Wrist extension     (Blank rows = not tested) (Key: WFL = within functional limits not formally assessed, * = concordant pain, s =  stiffness/stretching sensation, NT = not tested)  Comments:    UPPER EXTREMITY MMT:  MMT Right eval Left eval  Shoulder flexion 5 4+ *  Shoulder extension    Shoulder abduction 5 5  Shoulder  internal rotation 5 5  Shoulder external rotation 5 5  Elbow flexion 5 5  Elbow extension 5 5  Grip strength    (Blank rows = not tested)  (Key: WFL = within functional limits not formally assessed, * = concordant pain, s = stiffness/stretching sensation, NT = not tested)  Comments:   SHOULDER SPECIAL TESTS: Positive neer's and hawkin's kennedy on L, negative on R  JOINT MOBILITY TESTING:  NT  PALPATION:  LS/UT tenderness, anterior deltoid/biceps tenderness on LUE   TODAY'S TREATMENT:                                                                                                                                         [facility] Adult PT Treatment:                                                DATE: 01/07/23 Therapeutic Exercise: LS stretch towards R only x30sec Scapular retractions x10 cues for scapular mechanics Shoulder flexion iso at wall x5 with 5 sec hold HEP handout + education   PATIENT EDUCATION: Education details: Pt education on PT impairments, prognosis, and POC. Informed consent. Rationale for interventions, safe/appropriate HEP performance Person educated: Patient Education method: Explanation, Demonstration, Tactile cues, Verbal cues, and Handouts Education comprehension: verbalized understanding, returned demonstration, verbal cues required, tactile cues required, and needs further education    HOME EXERCISE PROGRAM: Access Code: [email or web address] URL: [email or web address] Date: 01/07/2023 Prepared by: Fransisco Hertz  Exercises - Seated Scapular Retraction  - 1 x daily - 7 x weekly - 3 sets - 10 reps - Gentle Levator Scapulae Stretch  - 1 x daily - 7 x weekly - 1-3 sets - 1-3 reps - 30sec hold - Isometric Shoulder Flexion at Wall  - 1 x daily - 7 x weekly - 3 sets - 5 reps - 5sec hold  ASSESSMENT:  CLINICAL IMPRESSION: Pt is a pleasant 89 year old gentleman who arrives to PT evaluation on this date for L shoulder pain. Pt reports difficulty with lifting  and raising arm to the side/overhead due to pain. During today's session pt demonstrates mild limitations in [facility] mobility on LUE, mild anterior shoulder weakness on LUE, and positive impingement provocation testing which are likely contributing to difficulty with aforementioned activities. Recommend skilled PT to address aforementioned deficits to improve functional independence/tolerance. No adverse events, pt tolerates session well without increase in pain. Of note, pt endorses exertional chest pain that he states his cardiologist is aware of and is following, describes this as stable/predictable. Pt  departs today's session in no acute distress, all voiced questions/concerns addressed appropriately from PT perspective.    OBJECTIVE IMPAIRMENTS: decreased activity tolerance, decreased mobility, decreased ROM, decreased strength, impaired perceived functional ability, impaired UE functional use, postural dysfunction, and pain.   ACTIVITY LIMITATIONS: carrying, lifting, and reach over head  PARTICIPATION LIMITATIONS: meal prep and cleaning  PERSONAL FACTORS: Age, Time since onset of injury/illness/exacerbation, and 3+ comorbidities: cardiac issues (including pacemaker), hx cancer, HTN, PVD  are also affecting patient's functional outcome.   REHAB POTENTIAL: Good  CLINICAL DECISION MAKING: Stable/uncomplicated  EVALUATION COMPLEXITY: Low   GOALS: Goals reviewed with patient? No  SHORT TERM GOALS: Target date: 01/28/2023 Pt will demonstrate appropriate understanding and performance of initially prescribed HEP in order to facilitate improved independence with management of symptoms.  Baseline: HEP provided on eval Goal status: INITIAL   2. Pt will score greater than or equal to 62 on FOTO in order to demonstrate improved perception of function due to symptoms.  Baseline: 59  Goal status: INITIAL    LONG TERM GOALS: Target date: 02/18/2023 Pt will score 66 or greater on FOTO in order to  demonstrate improved perception of function due to symptoms. Baseline: 59 Goal status: INITIAL  2.  Pt will demonstrate at least 150 degrees of active shoulder elevation on LUE with less than 2pt increase in pain on NPS in order to demonstrate improved tolerance to functional movement patterns such as reaching overhead. Baseline: see ROM chart above Goal status: INITIAL  3.  Pt will demonstrate at least 5/5 shoulder flexion MMT for improved symmetry of UE strength and improved tolerance to functional movements.  Baseline: see MMT chart above Goal status: INITIAL  4. Pt will report at least 50% decrease in overall pain levels in past week in order to facilitate improved tolerance to basic ADLs/mobility.   Baseline: 0-5/10 in past week  Goal status: INITIAL    5. Pt will endorse ability to perform typical light activities around the home (e.g putting dishes into cabinets) with less than 2pt increase in pain on NPS.  Baseline: avoiding overhead lifting, up to 5/10 pain   Goal status: INITIAL   PLAN:  PT FREQUENCY: 2x/week  PT DURATION: 6 weeks  PLANNED INTERVENTIONS: Therapeutic exercises, Therapeutic activity, Neuromuscular re-education, Balance training, Gait training, Patient/Family education, Self Care, Joint mobilization, DME instructions, Aquatic Therapy, Dry Needling, Spinal mobilization, Cryotherapy, Taping, Manual therapy, and Re-evaluation  PLAN FOR NEXT SESSION: Review/update HEP PRN. Work on [employer] exercises as appropriate with emphasis on anterior shoulder strengthening and periscapular endurance. Symptom modification strategies as indicated/appropriate (cancer hx, pacemaker). Mindful of cardiac comorbidities, monitor vitals PRN as pt endorses exertional chest pain (cardiology aware per pt)   Ashley Murrain PT, DPT 01/07/2023 1:08 PM

## 2023-01-07 ENCOUNTER — Encounter: Payer: Self-pay | Admitting: Physical Therapy

## 2023-01-07 ENCOUNTER — Ambulatory Visit (INDEPENDENT_AMBULATORY_CARE_PROVIDER_SITE_OTHER): Payer: Medicare Other | Admitting: Physical Therapy

## 2023-01-07 ENCOUNTER — Other Ambulatory Visit: Payer: Self-pay

## 2023-01-07 DIAGNOSIS — M25512 Pain in left shoulder: Secondary | ICD-10-CM | POA: Diagnosis not present

## 2023-01-07 DIAGNOSIS — M6281 Muscle weakness (generalized): Secondary | ICD-10-CM

## 2023-01-07 DIAGNOSIS — R293 Abnormal posture: Secondary | ICD-10-CM

## 2023-01-09 ENCOUNTER — Ambulatory Visit (INDEPENDENT_AMBULATORY_CARE_PROVIDER_SITE_OTHER): Payer: Medicare Other | Admitting: Physical Therapy

## 2023-01-09 ENCOUNTER — Encounter: Payer: Self-pay | Admitting: Physical Therapy

## 2023-01-09 DIAGNOSIS — R293 Abnormal posture: Secondary | ICD-10-CM | POA: Diagnosis not present

## 2023-01-09 DIAGNOSIS — M25512 Pain in left shoulder: Secondary | ICD-10-CM

## 2023-01-09 DIAGNOSIS — M6281 Muscle weakness (generalized): Secondary | ICD-10-CM

## 2023-01-09 NOTE — Therapy (Signed)
OUTPATIENT PHYSICAL THERAPY SHOULDER EVALUATION   Patient Name: David [name] MRN: [id number] DOB:1933-05-20, 89 y.o., male Today's Date: 01/09/2023  END OF SESSION:  PT End of Session - 01/09/23 1117     Visit Number 2    Number of Visits 13    Date for PT Re-Evaluation 02/18/23    Authorization Type Medicare    Progress Note Due on Visit 10    PT Start Time 1100    PT Stop Time 1138    PT Time Calculation (min) 38 min    Activity Tolerance Patient tolerated treatment well;No increased pain    Behavior During Therapy WFL for tasks assessed/performed              Past Medical History:  Diagnosis Date   Arthritis    "hands" (10/19/2013)   Bilateral carotid bruits 04/08/2020   Right greater than left   Bladder tumor    CAD (coronary artery disease)    cardiologist-  dr crouitoru   Carotid artery disease (HCC)    moderate pRICA 60-79%,  1-39% LICA , >50% RECA last duplex 08-05-2015   Cataract of both eyes    Complete heart block (HCC)    GERD (gastroesophageal reflux disease)    Glaucoma, both eyes    Hematuria    History of MI (myocardial infarction)    1989   Hyperlipidemia    Hypertension    Ischemic cardiomyopathy    ef 40-45%  without clinical heart failure per cardiology note 12-28-2015 (nuclear stress ef 43% from 07-28-2015)   Left ventricular dysfunction    Non Hodgkin's lymphoma (HCC) dx 06/2010---  oncologist-  dr Truett Perna---  in clinical remission since 2013   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 -- lymphadenopathy to neck, mediastinal , and abdominal--  chemotherapy completed 06/ 2011   NSVT (nonsustained ventricular tachycardia) [facility]) cardiologist-  dr crouitoru   Max rate 200 bpm, 28-beat, nearly 9-second episode recorded by pacemaker on November 15, asymptomatic    Pacemaker pacemaker dependant due to CHB   1st Pacemaker Placement 1998 w/ New generator  12-31-2000 ; 08-04-2008;  11-29-2015   PAD (peripheral artery disease)  (HCC)    followed by dr berry--- last duplex 08-05-2015  patents bilateral popliteal stents, bilateral SFA 30-49% stenosis   Peripheral vascular disease with claudication (HCC)    dx 2009 left popliteal occlusion with collaterals; 03- 2015  s/p  stenting right popliteal  by dr berry and left popliteal stenting by dr Pernell Dupre ([facility]) july 2015   S/P CABG x 5    09-10-1988   S/P drug eluting coronary stent placement 01/04/2006   01-04-2006--- DES to SVG to OM1, OM2, and IR, and SVG to PDA(x2 grafts)  and 03-04-2013  DES to SVR to OM   Wears glasses    Past Surgical History:  Procedure Laterality Date   ANAL FISSURE REPAIR  12-09-2000 and 1970's   CARDIAC CATHETERIZATION  04/09/2000   patent grafts   CARDIAC PACEMAKER PLACEMENT  11-15-1993   dr Laneta Simmers   CARDIOVASCULAR STRESS TEST  07-28-2015  dr crouitoru   Intermediate risk nuclear study w/ large, severe, predominantly fixed inferior and apical defect; minimal reversibility inferior lateral wall; findings consistent w/ large prior infarct and trivial peri-infarct ischemia;  ef 43% w/ inferior hypokinesis and apical akinesis; mild LVE   CORONARY ANGIOPLASTY WITH STENT PLACEMENT  01-04-2006  dr gamble   DES to pSVG to OM1, SVG to OM2 &  IR,  and mSVG to RCA dPDA (x2 grafts );  pLAD , pLCFX and  mRCA occluded;  LIMA to LAD mid-distal patent with occlusion beyond LIMA with collateral flow;  ef 30%   CORONARY ARTERY BYPASS GRAFT  09/10/1988   dr Andrey Campanile   "CABG X5" (03/04/2013) LIMA to LAD,SVG to imtermediate OM1 OM II,SVG to PDA   CYSTOSCOPY W/ RETROGRADES Bilateral 04/13/2016   Procedure: CYSTOSCOPY WITH RETROGRADE PYELOGRAM;  Surgeon: Sebastian Ache, MD;  Location: [facility];  Service: Urology;  Laterality: Bilateral;   EP IMPLANTABLE DEVICE N/A 11/29/2015   Procedure: Pacemaker Implant;  Surgeon: Thurmon Fair, MD;  Location: [facility] IN[facility]AS[facility] CV [facility];  Service: Cardiovascular;  Laterality: N/A;  new generator and new atrial, vertricular  leads;  [employer] Accolade   LEFT HEART CATHETERIZATION WITH CORONARY/GRAFT ANGIOGRAM N/A 03/04/2013   Procedure: LEFT HEART CATHETERIZATION WITH Isabel Caprice;  Surgeon: Lennette Bihari, MD;  Location: [facility] CATH LAB;  Service: Cardiovascular;  Laterality: N/A;   LOWER EXTREMITY ANGIOGRAM N/A 08/31/2013   Procedure: LOWER EXTREMITY ANGIOGRAM;  Surgeon: Runell Gess, MD;  Location: [facility] CATH LAB;  Service: Cardiovascular;  Laterality: N/A;   PACEMAKER REPLACEMENT  12/31/2000;  08-04-2008   PERCUTANEOUS CORONARY STENT INTERVENTION (PCI-S)  03/04/2013   Procedure: PERCUTANEOUS CORONARY STENT INTERVENTION (PCI-S);  Surgeon: Lennette Bihari, MD;  Location: [facility] CATH LAB;  Service: Cardiovascular;;  DES to S-OM for in-stent restenosis   POPLITEAL ARTERY STENT Right 10-21-2013  dr berry   atherectomy, PTA , and IDEV stent x1   POPLITEAL ARTERY STENT Left 07/ 2015  [facility] Pernell [facility]re [facility]   angioplasty / stent x2   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   SUPERFICIAL LYMPH NODE BIOPSY / EXCISION Left 2011   cervical lymph node   TRANSURETHRAL RESECTION OF BLADDER TUMOR N/A 04/13/2016   Procedure: TRANSURETHRAL RESECTION OF BLADDER TUMOR (TURBT) with clot evacuation;  Surgeon: Sebastian Ache, MD;  Location: [facility];  Service: Urology;  Laterality: N/A;   VENOGRAM Left 11/29/2015   Procedure: Venogram;  Surgeon: Thurmon Fair, MD;  Location: [facility] INVAS[facility] CV [facility];  Service: Cardiovascular;  Laterality: Left;   Patient Active Problem List   Diagnosis Date Noted   Bilateral carotid bruits 04/08/2020   Acute pain of right shoulder 06/11/2018   CHB (complete heart block) (HCC) 11/29/2015   Pacemaker battery depletion 11/28/2015   Pacemaker lead malfunction 11/28/2015   Pacemaker 10/21/2015   NSVT (nonsustained ventricular tachycardia) (HCC) 06/24/2014   Pseudoaneurysm following procedure (HCC) 10/22/2013   Claudication in peripheral vascular disease (HCC)  10/19/2013   Hematoma of groin -right sided; w/p PV angio 09/11/2013   Claudication- unsuccessful PTA attempt Lt popliteal 08/31/13 08/31/2013   Asymptomatic stenosis of right carotid artery without infarction 06/14/2013   Unstable angina (HCC) 03/05/2013   CAD s/p CABG 1995, s/p stent SVG-OM 2007 and July 2014 03/05/2013   S/P angioplasty with stent SVG-OM 7/30./14 03/05/2013   Complete heart block- [facility] pacemaker '95 with Gen change '02, '09. New leads 2017. Pacer dependednt 03/05/2013   PVD - Lt popliteal occlusion with collaterals '09; rt popliteal successful diamondback orbital rotational atherectomy PTA and stent using an IDEV stent 10/21/13 03/05/2013   Non Hodgkin's lymphoma- Feb 2011- radiation Rx 03/05/2013   Dyslipidemia 03/05/2013   HTN (hypertension) 03/05/2013   Cardiomyopathy, ischemic- EF 40-45% by echo 4/14 03/05/2013   Abnormal nuclear cardiac imaging test- high risk Myoview 02/13/13 03/05/2013    PCP: Lupita Raider,  MD  REFERRING PROVIDER: Tarry Kos, MD  REFERRING DIAG: [phone] (ICD-10-CM) - Chronic left shoulder pain  THERAPY DIAG:  Left shoulder pain, unspecified chronicity  Abnormal posture  Muscle weakness (generalized)  Rationale for Evaluation and Treatment: Rehabilitation  ONSET DATE: 3 months   SUBJECTIVE:                                                                                                                                                                                      SUBJECTIVE STATEMENT: He relays his shoulder feels about the same overall, he does have some questions about HEP.   Hand dominance: Right  PERTINENT HISTORY: pacer dependent heart block, PVD, angina, CAD s/p CABG, HTN, arrhythmias, non hodgkins lymphoma s/p radiation  PAIN:  Are you having pain: none Location/description: L shoulder, lateral proximal to elbow  Best-worst over past week: 0-5/10  - aggravating factors: lifting, quick movements, lifting  overhead (especially to side) - Easing factors: rest    PRECAUTIONS: pacemaker, cardiac hx, cancer hx, exertional chest pain (cardiology aware per pt)  WEIGHT BEARING RESTRICTIONS: No  FALLS:  Has patient fallen in last 6 months? No  LIVING ENVIRONMENT: W/ wife and daughter, 3 story, bed/bath are on second floor, tries to avoid stairs Family does majority of housework    OCCUPATION: Retired Financial controller, Insurance account manager  PLOF: Independent  PATIENT GOALS: less pain, be able to lift more and do his activities more freely, reach overhead (plate on top shelf)  NEXT MD VISIT: TBD/PRN, cardiology in July   OBJECTIVE:   DIAGNOSTIC FINDINGS:  12/25/22 L shoulder XR without acute abnormalities, refer to EPIC for details  PATIENT SURVEYS:  FOTO 59 current, 66 predicted in 10 visits  COGNITION: Overall cognitive status: Within functional limits for tasks assessed     SENSATION: WFL BIL UE   POSTURE: BIL shoulder IR, fwd head posture, increased kyphosis  UPPER EXTREMITY ROM:  A/PROM Right eval Left eval  Shoulder flexion 150 deg 140 deg pain  Shoulder abduction 115 deg 112 deg pain  Shoulder internal rotation    Shoulder external rotation (functional combo) C6 C6 painful   Elbow flexion    Elbow extension    Wrist flexion    Wrist extension     (Blank rows = not tested) (Key: WFL = within functional limits not formally assessed, * = concordant pain, s = stiffness/stretching sensation, NT = not tested)  Comments:    UPPER EXTREMITY MMT:  MMT Right eval Left eval  Shoulder flexion 5 4+ *  Shoulder extension    Shoulder abduction 5 5  Shoulder internal rotation 5 5  Shoulder external rotation 5 5  Elbow flexion 5 5  Elbow extension 5 5  Grip strength    (Blank rows = not tested)  (Key: WFL = within functional limits not formally assessed, * = concordant pain, s = stiffness/stretching sensation, NT = not tested)  Comments:   SHOULDER SPECIAL TESTS: Positive neer's and  hawkin's kennedy on L, negative on R  JOINT MOBILITY TESTING:  NT  PALPATION:  LS/UT tenderness, anterior deltoid/biceps tenderness on LUE   TODAY'S TREATMENT:                                                                                                                                         [facility] Adult PT Treatment:                                                DATE:  01/09/23 Therapeutic Exercise: UBE L1 2.5 min fwd, 2.5 min retro Shoulder pulleys 2 min flexion, 2 min abduction LS stretch towards R only 2x30sec Scapular retractions with shoulder rows red 2X10 Shoulder flexion iso at wall x10 with 5 sec hold Standing shoulder AAROM 1# bar X 10  01/07/23 Therapeutic Exercise: LS stretch towards R only x30sec Scapular retractions x10 cues for scapular mechanics Shoulder flexion iso at wall x5 with 5 sec hold HEP handout + education   PATIENT EDUCATION: Education details: Pt education on PT impairments, prognosis, and POC. Informed consent. Rationale for interventions, safe/appropriate HEP performance Person educated: Patient Education method: Explanation, Demonstration, Tactile cues, Verbal cues, and Handouts Education comprehension: verbalized understanding, returned demonstration, verbal cues required, tactile cues required, and needs further education    HOME EXERCISE PROGRAM: Access Code: [email or web address] URL: [email or web address] Date: 01/09/2023 Prepared by: Ivery Quale  Exercises - Seated Scapular Retraction  - 1 x daily - 7 x weekly - 3 sets - 10 reps - Gentle Levator Scapulae Stretch  - 1 x daily - 7 x weekly - 1-3 sets - 1-3 reps - 30sec hold - Isometric Shoulder Flexion at Wall  - 1 x daily - 7 x weekly - 3 sets - 5 reps - 5sec hold  Added below execise on 01/09/23 - Standing Shoulder Flexion AAROM with Dowel  - 1 x daily - 7 x weekly - 1-2 sets - 10 reps - 2 sec hold  ASSESSMENT:  CLINICAL IMPRESSION: We reviewed his HEP and answered his questions  about reps and sets. He shows good understanding and return demonstration of this and I did add in shoulder flexion AAROM exercise for him to add in as well. PT will continue to work to progress as tolerated. He denies any chest pain or distress with today's workout.   OBJECTIVE IMPAIRMENTS: decreased activity tolerance, decreased mobility, decreased ROM, decreased strength, impaired perceived functional ability, impaired UE functional use,  postural dysfunction, and pain.   ACTIVITY LIMITATIONS: carrying, lifting, and reach over head  PARTICIPATION LIMITATIONS: meal prep and cleaning  PERSONAL FACTORS: Age, Time since onset of injury/illness/exacerbation, and 3+ comorbidities: cardiac issues (including pacemaker), hx cancer, HTN, PVD  are also affecting patient's functional outcome.   REHAB POTENTIAL: Good  CLINICAL DECISION MAKING: Stable/uncomplicated  EVALUATION COMPLEXITY: Low   GOALS: Goals reviewed with patient? No  SHORT TERM GOALS: Target date: 01/28/2023 Pt will demonstrate appropriate understanding and performance of initially prescribed HEP in order to facilitate improved independence with management of symptoms.  Baseline: HEP provided on eval Goal status: INITIAL   2. Pt will score greater than or equal to 62 on FOTO in order to demonstrate improved perception of function due to symptoms.  Baseline: 59  Goal status: INITIAL    LONG TERM GOALS: Target date: 02/18/2023 Pt will score 66 or greater on FOTO in order to demonstrate improved perception of function due to symptoms. Baseline: 59 Goal status: INITIAL  2.  Pt will demonstrate at least 150 degrees of active shoulder elevation on LUE with less than 2pt increase in pain on NPS in order to demonstrate improved tolerance to functional movement patterns such as reaching overhead. Baseline: see ROM chart above Goal status: INITIAL  3.  Pt will demonstrate at least 5/5 shoulder flexion MMT for improved symmetry of UE  strength and improved tolerance to functional movements.  Baseline: see MMT chart above Goal status: INITIAL  4. Pt will report at least 50% decrease in overall pain levels in past week in order to facilitate improved tolerance to basic ADLs/mobility.   Baseline: 0-5/10 in past week  Goal status: INITIAL    5. Pt will endorse ability to perform typical light activities around the home (e.g putting dishes into cabinets) with less than 2pt increase in pain on NPS.  Baseline: avoiding overhead lifting, up to 5/10 pain   Goal status: INITIAL   PLAN:  PT FREQUENCY: 2x/week  PT DURATION: 6 weeks  PLANNED INTERVENTIONS: Therapeutic exercises, Therapeutic activity, Neuromuscular re-education, Balance training, Gait training, Patient/Family education, Self Care, Joint mobilization, DME instructions, Aquatic Therapy, Dry Needling, Spinal mobilization, Cryotherapy, Taping, Manual therapy, and Re-evaluation  PLAN FOR NEXT SESSION: Review/update HEP PRN. Work on [employer] exercises as appropriate with emphasis on anterior shoulder strengthening and periscapular endurance. Symptom modification strategies as indicated/appropriate (cancer hx, pacemaker). Mindful of cardiac comorbidities, monitor vitals PRN as pt endorses exertional chest pain (cardiology aware per pt)  Ivery Quale, PT, DPT 01/09/23 11:17 AM

## 2023-01-16 ENCOUNTER — Ambulatory Visit (INDEPENDENT_AMBULATORY_CARE_PROVIDER_SITE_OTHER): Payer: Medicare Other | Admitting: Physical Therapy

## 2023-01-16 ENCOUNTER — Encounter: Payer: Self-pay | Admitting: Physical Therapy

## 2023-01-16 DIAGNOSIS — R293 Abnormal posture: Secondary | ICD-10-CM

## 2023-01-16 DIAGNOSIS — M6281 Muscle weakness (generalized): Secondary | ICD-10-CM | POA: Diagnosis not present

## 2023-01-16 DIAGNOSIS — M25512 Pain in left shoulder: Secondary | ICD-10-CM

## 2023-01-16 NOTE — Therapy (Signed)
OUTPATIENT PHYSICAL THERAPY SHOULDER EVALUATION   Patient Name: David [name] MRN: [id number] DOB:02/25/1933, 89 y.o., male Today's Date: 01/16/2023  END OF SESSION:  PT End of Session - 01/16/23 1015     Visit Number 3    Number of Visits 13    Date for PT Re-Evaluation 02/18/23    Authorization Type Medicare    Progress Note Due on Visit 10    PT Start Time 1015    PT Stop Time 1055    PT Time Calculation (min) 40 min    Activity Tolerance Patient tolerated treatment well;No increased pain    Behavior During Therapy WFL for tasks assessed/performed              Past Medical History:  Diagnosis Date   Arthritis    "hands" (10/19/2013)   Bilateral carotid bruits 04/08/2020   Right greater than left   Bladder tumor    CAD (coronary artery disease)    cardiologist-  dr crouitoru   Carotid artery disease (HCC)    moderate pRICA 60-79%,  1-39% LICA , >50% RECA last duplex 08-05-2015   Cataract of both eyes    Complete heart block (HCC)    GERD (gastroesophageal reflux disease)    Glaucoma, both eyes    Hematuria    History of MI (myocardial infarction)    1989   Hyperlipidemia    Hypertension    Ischemic cardiomyopathy    ef 40-45%  without clinical heart failure per cardiology note 12-28-2015 (nuclear stress ef 43% from 07-28-2015)   Left ventricular dysfunction    Non Hodgkin's lymphoma (HCC) dx 06/2010---  oncologist-  dr Truett Perna---  in clinical remission since 2013   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 -- lymphadenopathy to neck, mediastinal , and abdominal--  chemotherapy completed 06/ 2011   NSVT (nonsustained ventricular tachycardia) [facility]) cardiologist-  dr crouitoru   Max rate 200 bpm, 28-beat, nearly 9-second episode recorded by pacemaker on November 15, asymptomatic    Pacemaker pacemaker dependant due to CHB   1st Pacemaker Placement 1998 w/ New generator  12-31-2000 ; 08-04-2008;  11-29-2015   PAD (peripheral artery disease)  (HCC)    followed by dr berry--- last duplex 08-05-2015  patents bilateral popliteal stents, bilateral SFA 30-49% stenosis   Peripheral vascular disease with claudication (HCC)    dx 2009 left popliteal occlusion with collaterals; 03- 2015  s/p  stenting right popliteal  by dr berry and left popliteal stenting by dr Pernell Dupre ([facility]) july 2015   S/P CABG x 5    09-10-1988   S/P drug eluting coronary stent placement 01/04/2006   01-04-2006--- DES to SVG to OM1, OM2, and IR, and SVG to PDA(x2 grafts)  and 03-04-2013  DES to SVR to OM   Wears glasses    Past Surgical History:  Procedure Laterality Date   ANAL FISSURE REPAIR  12-09-2000 and 1970's   CARDIAC CATHETERIZATION  04/09/2000   patent grafts   CARDIAC PACEMAKER PLACEMENT  11-15-1993   dr Laneta Simmers   CARDIOVASCULAR STRESS TEST  07-28-2015  dr crouitoru   Intermediate risk nuclear study w/ large, severe, predominantly fixed inferior and apical defect; minimal reversibility inferior lateral wall; findings consistent w/ large prior infarct and trivial peri-infarct ischemia;  ef 43% w/ inferior hypokinesis and apical akinesis; mild LVE   CORONARY ANGIOPLASTY WITH STENT PLACEMENT  01-04-2006  dr gamble   DES to pSVG to OM1, SVG to OM2 &  IR,  and mSVG to RCA dPDA (x2 grafts );  pLAD , pLCFX and  mRCA occluded;  LIMA to LAD mid-distal patent with occlusion beyond LIMA with collateral flow;  ef 30%   CORONARY ARTERY BYPASS GRAFT  09/10/1988   dr Andrey Campanile   "CABG X5" (03/04/2013) LIMA to LAD,SVG to imtermediate OM1 OM II,SVG to PDA   CYSTOSCOPY W/ RETROGRADES Bilateral 04/13/2016   Procedure: CYSTOSCOPY WITH RETROGRADE PYELOGRAM;  Surgeon: Sebastian Ache, MD;  Location: [facility];  Service: Urology;  Laterality: Bilateral;   EP IMPLANTABLE DEVICE N/A 11/29/2015   Procedure: Pacemaker Implant;  Surgeon: Thurmon Fair, MD;  Location: [facility] IN[facility] CV [facility];  Service: Cardiovascular;  Laterality: N/A;  new generator and new atrial, vertricular  leads;  [employer] Accolade   LEFT HEART CATHETERIZATION WITH CORONARY/GRAFT ANGIOGRAM N/A 03/04/2013   Procedure: LEFT HEART CATHETERIZATION WITH Isabel Caprice;  Surgeon: Lennette Bihari, MD;  Location: [facility] CATH LAB;  Service: Cardiovascular;  Laterality: N/A;   LOWER EXTREMITY ANGIOGRAM N/A 08/31/2013   Procedure: LOWER EXTREMITY ANGIOGRAM;  Surgeon: Runell Gess, MD;  Location: [facility] CATH LAB;  Service: Cardiovascular;  Laterality: N/A;   PACEMAKER REPLACEMENT  12/31/2000;  08-04-2008   PERCUTANEOUS CORONARY STENT INTERVENTION (PCI-S)  03/04/2013   Procedure: PERCUTANEOUS CORONARY STENT INTERVENTION (PCI-S);  Surgeon: Lennette Bihari, MD;  Location: [facility] CATH LAB;  Service: Cardiovascular;;  DES to S-OM for in-stent restenosis   POPLITEAL ARTERY STENT Right 10-21-2013  dr berry   atherectomy, PTA , and IDEV stent x1   POPLITEAL ARTERY STENT Left 07/ 2015  dr Pernell Dupre at [facility]   angioplasty / stent x2   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   SUPERFICIAL LYMPH NODE BIOPSY / EXCISION Left 2011   cervical lymph node   TRANSURETHRAL RESECTION OF BLADDER TUMOR N/A 04/13/2016   Procedure: TRANSURETHRAL RESECTION OF BLADDER TUMOR (TURBT) with clot evacuation;  Surgeon: Sebastian Ache, MD;  Location: [facility];  Service: Urology;  Laterality: N/A;   VENOGRAM Left 11/29/2015   Procedure: Venogram;  Surgeon: Thurmon Fair, MD;  Location: [facility] CV [facility];  Service: Cardiovascular;  Laterality: Left;   Patient Active Problem List   Diagnosis Date Noted   Bilateral carotid bruits 04/08/2020   Acute pain of right shoulder 06/11/2018   CHB (complete heart block) (HCC) 11/29/2015   Pacemaker battery depletion 11/28/2015   Pacemaker lead malfunction 11/28/2015   Pacemaker 10/21/2015   NSVT (nonsustained ventricular tachycardia) (HCC) 06/24/2014   Pseudoaneurysm following procedure (HCC) 10/22/2013   Claudication in peripheral vascular disease (HCC)  10/19/2013   Hematoma of groin -right sided; w/p PV angio 09/11/2013   Claudication- unsuccessful PTA attempt Lt popliteal 08/31/13 08/31/2013   Asymptomatic stenosis of right carotid artery without infarction 06/14/2013   Unstable angina (HCC) 03/05/2013   CAD s/p CABG 1995, s/p stent SVG-OM 2007 and July 2014 03/05/2013   S/P angioplasty with stent SVG-OM 7/30./14 03/05/2013   Complete heart block- [facility] pacemaker '95 with Gen change '02, '09. New leads 2017. Pacer dependednt 03/05/2013   PVD - Lt popliteal occlusion with collaterals '09; rt popliteal successful diamondback orbital rotational atherectomy PTA and stent using an IDEV stent 10/21/13 03/05/2013   Non Hodgkin's lymphoma- Feb 2011- radiation Rx 03/05/2013   Dyslipidemia 03/05/2013   HTN (hypertension) 03/05/2013   Cardiomyopathy, ischemic- EF 40-45% by echo 4/14 03/05/2013   Abnormal nuclear cardiac imaging test- high risk Myoview 02/13/13 03/05/2013    PCP: Lupita Raider,  MD  REFERRING PROVIDER: Tarry Kos, MD  REFERRING DIAG: [phone] (ICD-10-CM) - Chronic left shoulder pain  THERAPY DIAG:  Left shoulder pain, unspecified chronicity  Abnormal posture  Muscle weakness (generalized)  Rationale for Evaluation and Treatment: Rehabilitation  ONSET DATE: 3 months   SUBJECTIVE:                                                                                                                                                                                      SUBJECTIVE STATEMENT: He relays his shoulder is not any worse, a little bit of chest pain today but this is normal for him.  Hand dominance: Right  PERTINENT HISTORY: pacer dependent heart block, PVD, angina, CAD s/p CABG, HTN, arrhythmias, non hodgkins lymphoma s/p radiation  PAIN:  Are you having pain: none Location/description: L shoulder, lateral proximal to elbow  Best-worst over past week: 0-5/10  - aggravating factors: lifting, quick movements,  lifting overhead (especially to side) - Easing factors: rest    PRECAUTIONS: pacemaker, cardiac hx, cancer hx, exertional chest pain (cardiology aware per pt)  WEIGHT BEARING RESTRICTIONS: No  FALLS:  Has patient fallen in last 6 months? No  LIVING ENVIRONMENT: W/ wife and daughter, 3 story, bed/bath are on second floor, tries to avoid stairs Family does majority of housework    OCCUPATION: Retired Financial controller, Insurance account manager  PLOF: Independent  PATIENT GOALS: less pain, be able to lift more and do his activities more freely, reach overhead (plate on top shelf)  NEXT MD VISIT: TBD/PRN, cardiology in July   OBJECTIVE:   DIAGNOSTIC FINDINGS:  12/25/22 L shoulder XR without acute abnormalities, refer to EPIC for details  PATIENT SURVEYS:  FOTO 59 current, 66 predicted in 10 visits  COGNITION: Overall cognitive status: Within functional limits for tasks assessed     SENSATION: WFL BIL UE   POSTURE: BIL shoulder IR, fwd head posture, increased kyphosis  UPPER EXTREMITY ROM:  A/PROM Right eval Left eval  Shoulder flexion 150 deg 140 deg pain  Shoulder abduction 115 deg 112 deg pain  Shoulder internal rotation    Shoulder external rotation (functional combo) C6 C6 painful   Elbow flexion    Elbow extension    Wrist flexion    Wrist extension     (Blank rows = not tested) (Key: WFL = within functional limits not formally assessed, * = concordant pain, s = stiffness/stretching sensation, NT = not tested)  Comments:    UPPER EXTREMITY MMT:  MMT Right eval Left eval  Shoulder flexion 5 4+ *  Shoulder extension    Shoulder abduction 5 5  Shoulder internal rotation 5 5  Shoulder  external rotation 5 5  Elbow flexion 5 5  Elbow extension 5 5  Grip strength    (Blank rows = not tested)  (Key: WFL = within functional limits not formally assessed, * = concordant pain, s = stiffness/stretching sensation, NT = not tested)  Comments:   SHOULDER SPECIAL TESTS: Positive  neer's and hawkin's kennedy on L, negative on R  JOINT MOBILITY TESTING:  NT  PALPATION:  LS/UT tenderness, anterior deltoid/biceps tenderness on LUE   TODAY'S TREATMENT:                                                                                                                                         PT Treatment:                                                DATE:  01/16/23 Therapeutic Exercise: UBE L2, 2.5 min fwd, 2.5 min retro Shoulder pulleys 2 min flexion, 2 min abduction Left shoulder posterior capsule stretch 10 sec X 5 LS stretch towards R only 3x30sec shoulder rows red 2X10 Shoulder extensions red 2X10 Shoulder isomretrics at wall for flexion, IR, ER x10 with 5 sec hold Shoulder flexion AAROM 1# bar X 10 Shoulder abduction AAROM 1# X 10 Shoulder ER AAROM 1# X 10  PT Treatment:                                                DATE:  01/09/23 Therapeutic Exercise: UBE L1 2.5 min fwd, 2.5 min retro Shoulder pulleys 2 min flexion, 2 min abduction LS stretch towards R only 2x30sec Scapular retractions with shoulder rows red 2X10 Shoulder flexion iso at wall x10 with 5 sec hold Standing shoulder AAROM 1# bar X 10  01/07/23 Therapeutic Exercise: LS stretch towards R only x30sec Scapular retractions x10 cues for scapular mechanics Shoulder flexion iso at wall x5 with 5 sec hold HEP handout + education   PATIENT EDUCATION: Education details: Pt education on PT impairments, prognosis, and POC. Informed consent. Rationale for interventions, safe/appropriate HEP performance Person educated: Patient Education method: Explanation, Demonstration, Tactile cues, Verbal cues, and Handouts Education comprehension: verbalized understanding, returned demonstration, verbal cues required, tactile cues required, and needs further education    HOME EXERCISE PROGRAM: Access Code: [email or web address] URL: [email or web address] Date: 01/09/2023 Prepared by: Ivery Quale  Exercises - Seated Scapular Retraction  - 1 x daily - 7 x weekly - 3 sets - 10 reps - Gentle Levator Scapulae Stretch  - 1 x daily - 7 x weekly - 1-3 sets - 1-3 reps - 30sec hold - Isometric Shoulder Flexion at Wall  - 1  x daily - 7 x weekly - 3 sets - 5 reps - 5sec hold  Added below execise on 01/09/23 - Standing Shoulder Flexion AAROM with Dowel  - 1 x daily - 7 x weekly - 1-2 sets - 10 reps - 2 sec hold  ASSESSMENT:  CLINICAL IMPRESSION: He showed good tolerance to exercise progressions today. We will continue to work to improve his left shoulder function as tolerated while monitoring for any shoulder or chest pain as well as soreness.    OBJECTIVE IMPAIRMENTS: decreased activity tolerance, decreased mobility, decreased ROM, decreased strength, impaired perceived functional ability, impaired UE functional use, postural dysfunction, and pain.   ACTIVITY LIMITATIONS: carrying, lifting, and reach over head  PARTICIPATION LIMITATIONS: meal prep and cleaning  PERSONAL FACTORS: Age, Time since onset of injury/illness/exacerbation, and 3+ comorbidities: cardiac issues (including pacemaker), hx cancer, HTN, PVD  are also affecting patient's functional outcome.   REHAB POTENTIAL: Good  CLINICAL DECISION MAKING: Stable/uncomplicated  EVALUATION COMPLEXITY: Low   GOALS: Goals reviewed with patient? No  SHORT TERM GOALS: Target date: 01/28/2023 Pt will demonstrate appropriate understanding and performance of initially prescribed HEP in order to facilitate improved independence with management of symptoms.  Baseline: HEP provided on eval Goal status: INITIAL   2. Pt will score greater than or equal to 62 on FOTO in order to demonstrate improved perception of function due to symptoms.  Baseline: 59  Goal status: INITIAL    LONG TERM GOALS: Target date: 02/18/2023 Pt will score 66 or greater on FOTO in order to demonstrate improved perception of function due to symptoms. Baseline:  59 Goal status: INITIAL  2.  Pt will demonstrate at least 150 degrees of active shoulder elevation on LUE with less than 2pt increase in pain on NPS in order to demonstrate improved tolerance to functional movement patterns such as reaching overhead. Baseline: see ROM chart above Goal status: INITIAL  3.  Pt will demonstrate at least 5/5 shoulder flexion MMT for improved symmetry of UE strength and improved tolerance to functional movements.  Baseline: see MMT chart above Goal status: INITIAL  4. Pt will report at least 50% decrease in overall pain levels in past week in order to facilitate improved tolerance to basic ADLs/mobility.   Baseline: 0-5/10 in past week  Goal status: INITIAL    5. Pt will endorse ability to perform typical light activities around the home (e.g putting dishes into cabinets) with less than 2pt increase in pain on NPS.  Baseline: avoiding overhead lifting, up to 5/10 pain   Goal status: INITIAL   PLAN:  PT FREQUENCY: 2x/week  PT DURATION: 6 weeks  PLANNED INTERVENTIONS: Therapeutic exercises, Therapeutic activity, Neuromuscular re-education, Balance training, Gait training, Patient/Family education, Self Care, Joint mobilization, DME instructions, Aquatic Therapy, Dry Needling, Spinal mobilization, Cryotherapy, Taping, Manual therapy, and Re-evaluation  PLAN FOR NEXT SESSION: Review/update HEP PRN. Work on [employer] exercises as appropriate with emphasis on anterior shoulder strengthening and periscapular endurance. Symptom modification strategies as indicated/appropriate (cancer hx, pacemaker). Mindful of cardiac comorbidities, monitor vitals PRN as pt endorses exertional chest pain (cardiology aware per pt)  Ivery Quale, PT, DPT 01/16/23 10:50 AM

## 2023-01-17 NOTE — Therapy (Signed)
OUTPATIENT PHYSICAL THERAPY TREATMENT NOTE   Patient Name: David [name] MRN: [id number] DOB:1933-07-04, 89 y.o., male Today's Date: 01/18/2023  END OF SESSION:  PT End of Session - 01/18/23 1100     Visit Number 4    Number of Visits 13    Date for PT Re-Evaluation 02/18/23    Authorization Type Medicare    Progress Note Due on Visit 10    PT Start Time 1100    PT Stop Time 1141    PT Time Calculation (min) 41 min    Activity Tolerance Patient tolerated treatment well;No increased pain    Behavior During Therapy WFL for tasks assessed/performed               Past Medical History:  Diagnosis Date   Arthritis    "hands" (10/19/2013)   Bilateral carotid bruits 04/08/2020   Right greater than left   Bladder tumor    CAD (coronary artery disease)    cardiologist-  dr crouitoru   Carotid artery disease (HCC)    moderate pRICA 60-79%,  1-39% LICA , >50% RECA last duplex 08-05-2015   Cataract of both eyes    Complete heart block (HCC)    GERD (gastroesophageal reflux disease)    Glaucoma, both eyes    Hematuria    History of MI (myocardial infarction)    1989   Hyperlipidemia    Hypertension    Ischemic cardiomyopathy    ef 40-45%  without clinical heart failure per cardiology note 12-28-2015 (nuclear stress ef 43% from 07-28-2015)   Left ventricular dysfunction    Non Hodgkin's lymphoma (HCC) dx 06/2010---  oncologist-  dr Truett Perna---  in clinical remission since 2013   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 -- lymphadenopathy to neck, mediastinal , and abdominal--  chemotherapy completed 06/ 2011   NSVT (nonsustained ventricular tachycardia) [facility]) cardiologist-  dr crouitoru   Max rate 200 bpm, 28-beat, nearly 9-second episode recorded by pacemaker on November 15, asymptomatic    Pacemaker pacemaker dependant due to CHB   1st Pacemaker Placement 1998 w/ New generator  12-31-2000 ; 08-04-2008;  11-29-2015   PAD (peripheral artery disease) (HCC)     followed by dr berry--- last duplex 08-05-2015  patents bilateral popliteal stents, bilateral SFA 30-49% stenosis   Peripheral vascular disease with claudication (HCC)    dx 2009 left popliteal occlusion with collaterals; 03- 2015  s/p  stenting right popliteal  by dr berry and left popliteal stenting by dr Pernell Dupre ([facility]) july 2015   S/P CABG x 5    09-10-1988   S/P drug eluting coronary stent placement 01/04/2006   01-04-2006--- DES to SVG to OM1, OM2, and IR, and SVG to PDA(x2 grafts)  and 03-04-2013  DES to SVR to OM   Wears glasses    Past Surgical History:  Procedure Laterality Date   ANAL FISSURE REPAIR  12-09-2000 and 1970's   CARDIAC CATHETERIZATION  04/09/2000   patent grafts   CARDIAC PACEMAKER PLACEMENT  11-15-1993   dr Laneta Simmers   CARDIOVASCULAR STRESS TEST  07-28-2015  dr crouitoru   Intermediate risk nuclear study w/ large, severe, predominantly fixed inferior and apical defect; minimal reversibility inferior lateral wall; findings consistent w/ large prior infarct and trivial peri-infarct ischemia;  ef 43% w/ inferior hypokinesis and apical akinesis; mild LVE   CORONARY ANGIOPLASTY WITH STENT PLACEMENT  01-04-2006  dr gamble   DES to pSVG to OM1, SVG to OM2 &  IR, and mSVG to RCA dPDA (x2 grafts );  pLAD , pLCFX and  mRCA occluded;  LIMA to LAD mid-distal patent with occlusion beyond LIMA with collateral flow;  ef 30%   CORONARY ARTERY BYPASS GRAFT  09/10/1988   dr Andrey Campanile   "CABG X5" (03/04/2013) LIMA to LAD,SVG to imtermediate OM1 OM II,SVG to PDA   CYSTOSCOPY W/ RETROGRADES Bilateral 04/13/2016   Procedure: CYSTOSCOPY WITH RETROGRADE PYELOGRAM;  Surgeon: Sebastian Ache, MD;  Location: [facility];  Service: Urology;  Laterality: Bilateral;   EP IMPLANTABLE DEVICE N/A 11/29/2015   Procedure: Pacemaker Implant;  Surgeon: Thurmon Fair, MD;  Location: [facility] INVASIVE CV LAB;  Service: Cardiovascular;  Laterality: N/A;  new generator and new atrial, vertricular leads;   [employer] Accolade   LEFT HEART CATHETERIZATION WITH CORONARY/GRAFT ANGIOGRAM N/A 03/04/2013   Procedure: LEFT HEART CATHETERIZATION WITH Isabel Caprice;  Surgeon: Lennette Bihari, MD;  Location: [facility] CATH LAB;  Service: Cardiovascular;  Laterality: N/A;   LOWER EXTREMITY ANGIOGRAM N/A 08/31/2013   Procedure: LOWER EXTREMITY ANGIOGRAM;  Surgeon: Runell Gess, MD;  Location: [facility] CATH LAB;  Service: Cardiovascular;  Laterality: N/A;   PACEMAKER REPLACEMENT  12/31/2000;  08-04-2008   PERCUTANEOUS CORONARY STENT INTERVENTION (PCI-S)  03/04/2013   Procedure: PERCUTANEOUS CORONARY STENT INTERVENTION (PCI-S);  Surgeon: Lennette Bihari, MD;  Location: [facility] CATH LAB;  Service: Cardiovascular;;  DES to S-OM for in-stent restenosis   POPLITEAL ARTERY STENT Right 10-21-2013  dr berry   atherectomy, PTA , and IDEV stent x1   POPLITEAL ARTERY STENT Left 07/ 2015  dr Pernell [facility]re at [facility]   angioplasty / stent x2   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   SUPERFICIAL LYMPH NODE BIOPSY / EXCISION Left 2011   cervical lymph node   TRANSURETHRAL RESECTION OF BLADDER TUMOR N/A 04/13/2016   Procedure: TRANSURETHRAL RESECTION OF BLADDER TUMOR (TURBT) with clot evacuation;  Surgeon: Sebastian Ache, MD;  Location: [facility];  Service: Urology;  Laterality: N/A;   VENOGRAM Left 11/29/2015   Procedure: Venogram;  Surgeon: Thurmon Fair, MD;  Location: [facility] INVASIVE CV LAB;  Service: Cardiovascular;  Laterality: Left;   Patient Active Problem List   Diagnosis Date Noted   Bilateral carotid bruits 04/08/2020   Acute pain of right shoulder 06/11/2018   CHB (complete heart block) (HCC) 11/29/2015   Pacemaker battery depletion 11/28/2015   Pacemaker lead malfunction 11/28/2015   Pacemaker 10/21/2015   NSVT (nonsustained ventricular tachycardia) (HCC) 06/24/2014   Pseudoaneurysm following procedure (HCC) 10/22/2013   Claudication in peripheral vascular disease (HCC)  10/19/2013   Hematoma of groin -right sided; w/p PV angio 09/11/2013   Claudication- unsuccessful PTA attempt Lt popliteal 08/31/13 08/31/2013   Asymptomatic stenosis of right carotid artery without infarction 06/14/2013   Unstable angina (HCC) 03/05/2013   CAD s/p CABG 1995, s/p stent SVG-OM 2007 and July 2014 03/05/2013   S/P angioplasty with stent SVG-OM 7/30./14 03/05/2013   Complete heart block- [facility] pacemaker '95 with Gen change '02, '09. New leads 2017. Pacer dependednt 03/05/2013   PVD - Lt popliteal occlusion with collaterals '09; rt popliteal successful diamondback orbital rotational atherectomy PTA and stent using an IDEV stent 10/21/13 03/05/2013   Non Hodgkin's lymphoma- Feb 2011- radiation Rx 03/05/2013   Dyslipidemia 03/05/2013   HTN (hypertension) 03/05/2013   Cardiomyopathy, ischemic- EF 40-45% by echo 4/14 03/05/2013   Abnormal nuclear cardiac imaging test- high risk Myoview 02/13/13 03/05/2013    PCP: Clelia Croft,  Rockney Ghee, MD  REFERRING PROVIDER: Tarry Kos, MD  REFERRING DIAG: [phone] (ICD-10-CM) - Chronic left shoulder pain  THERAPY DIAG:  Left shoulder pain, unspecified chronicity  Abnormal posture  Muscle weakness (generalized)  Rationale for Evaluation and Treatment: Rehabilitation  ONSET DATE: 3 months   SUBJECTIVE:                                                                                                                                                                                      SUBJECTIVE STATEMENT: Pt states that he feels that his shoulder seems to be getting a bit better. HEP going well, feeling comfortable. No chest pain today   Hand dominance: Right  PERTINENT HISTORY: pacer dependent heart block, PVD, angina, CAD s/p CABG, HTN, arrhythmias, non hodgkins lymphoma s/p radiation  PAIN:  Are you having pain: none Location/description: L shoulder, lateral proximal to elbow  Best-worst over past week: 0-5/10  - aggravating  factors: lifting, quick movements, lifting overhead (especially to side) - Easing factors: rest    PRECAUTIONS: pacemaker, cardiac hx, cancer hx, exertional chest pain (cardiology aware per pt)  WEIGHT BEARING RESTRICTIONS: No  FALLS:  Has patient fallen in last 6 months? No  LIVING ENVIRONMENT: W/ wife and daughter, 3 story, bed/bath are on second floor, tries to avoid stairs Family does majority of housework    OCCUPATION: Retired Financial controller, Insurance account manager  PLOF: Independent  PATIENT GOALS: less pain, be able to lift more and do his activities more freely, reach overhead (plate on top shelf)  NEXT MD VISIT: TBD/PRN, cardiology in July   OBJECTIVE: (objective measures completed at initial evaluation unless otherwise dated)   DIAGNOSTIC FINDINGS:  12/25/22 L shoulder XR without acute abnormalities, refer to EPIC for details  PATIENT SURVEYS:  FOTO 59 current, 66 predicted in 10 visits  COGNITION: Overall cognitive status: Within functional limits for tasks assessed     SENSATION: WFL BIL UE   POSTURE: BIL shoulder IR, fwd head posture, increased kyphosis  UPPER EXTREMITY ROM:  A/PROM Right eval Left eval Left 01/18/23  Shoulder flexion 150 deg 140 deg pain 146 deg mild pain   Shoulder abduction 115 deg 112 deg pain   Shoulder internal rotation     Shoulder external rotation (functional combo) C6 C6 painful    Elbow flexion     Elbow extension     Wrist flexion     Wrist extension      (Blank rows = not tested) (Key: WFL = within functional limits not formally assessed, * = concordant pain, s = stiffness/stretching sensation, NT = not tested)  Comments:    UPPER EXTREMITY MMT:  MMT  Right eval Left eval  Shoulder flexion 5 4+ *  Shoulder extension    Shoulder abduction 5 5  Shoulder internal rotation 5 5  Shoulder external rotation 5 5  Elbow flexion 5 5  Elbow extension 5 5  Grip strength    (Blank rows = not tested)  (Key: WFL = within functional limits  not formally assessed, * = concordant pain, s = stiffness/stretching sensation, NT = not tested)  Comments:   SHOULDER SPECIAL TESTS: Positive neer's and hawkin's kennedy on L, negative on R  JOINT MOBILITY TESTING:  NT  PALPATION:  LS/UT tenderness, anterior deltoid/biceps tenderness on LUE   TODAY'S TREATMENT:                                                                                                                                         [facility] Adult PT Treatment:                                                DATE: 01/18/23 Therapeutic Exercise: GTB row 3x8 cues for form GTB shoulder ext 3x8 cues for posture GH flexion standing AAROM w/ 2# wate bar 2x8 cues for comfortable  L posterior capsule stretch 5x10sec hold  Shoulder abduction AAROM w/ 2# wate bar 2x8 Shoulder flexion swiss ball rollout, standing x12 cues for comfortable ROM  Verbal HEP review + education, relevant anatomy/physiology    PT Treatment:                                                DATE:  01/16/23 Therapeutic Exercise: UBE L2, 2.5 min fwd, 2.5 min retro Shoulder pulleys 2 min flexion, 2 min abduction Left shoulder posterior capsule stretch 10 sec X 5 LS stretch towards R only 3x30sec shoulder rows red 2X10 Shoulder extensions red 2X10 Shoulder isomretrics at wall for flexion, IR, ER x10 with 5 sec hold Shoulder flexion AAROM 1# bar X 10 Shoulder abduction AAROM 1# X 10 Shoulder ER AAROM 1# X 10  PT Treatment:                                                DATE:  01/09/23 Therapeutic Exercise: UBE L1 2.5 min fwd, 2.5 min retro Shoulder pulleys 2 min flexion, 2 min abduction LS stretch towards R only 2x30sec Scapular retractions with shoulder rows red 2X10 Shoulder flexion iso at wall x10 with 5 sec hold Standing shoulder AAROM 1# bar X 10  01/07/23 Therapeutic Exercise: LS stretch towards  R only x30sec Scapular retractions x10 cues for scapular mechanics Shoulder flexion iso at wall x5 with 5  sec hold HEP handout + education   PATIENT EDUCATION: Education details: rationale for interventions, HEP  Person educated: Patient Education method: Explanation, Demonstration, Tactile cues, Verbal cues, and Handouts Education comprehension: verbalized understanding, returned demonstration, verbal cues required, tactile cues required, and needs further education    HOME EXERCISE PROGRAM: Access Code: [email or web address] URL: [email or web address] Date: 01/09/2023 Prepared by: Ivery Quale  Exercises - Seated Scapular Retraction  - 1 x daily - 7 x weekly - 3 sets - 10 reps - Gentle Levator Scapulae Stretch  - 1 x daily - 7 x weekly - 1-3 sets - 1-3 reps - 30sec hold - Isometric Shoulder Flexion at Wall  - 1 x daily - 7 x weekly - 3 sets - 5 reps - 5sec hold  Added below execise on 01/09/23 - Standing Shoulder Flexion AAROM with Dowel  - 1 x daily - 7 x weekly - 1-2 sets - 10 reps - 2 sec hold  ASSESSMENT:  CLINICAL IMPRESSION: Pt arrives w/o significant pain, no chest pain. Continuing to work on L shoulder strength/mobility which pt tolerates well. Noted improvement in L GH AROM on this date. No adverse events, some mild discomfort during abduction AAROM that resolves with rest and stretching. No pain on departure. Recommend continuing with skilled PT to address relevant deficits and improve functional tolerance. Pt departs today's session in no acute distress, all voiced questions/concerns addressed appropriately from PT perspective.     OBJECTIVE IMPAIRMENTS: decreased activity tolerance, decreased mobility, decreased ROM, decreased strength, impaired perceived functional ability, impaired UE functional use, postural dysfunction, and pain.   ACTIVITY LIMITATIONS: carrying, lifting, and reach over head  PARTICIPATION LIMITATIONS: meal prep and cleaning  PERSONAL FACTORS: Age, Time since onset of injury/illness/exacerbation, and 3+ comorbidities: cardiac issues (including  pacemaker), hx cancer, HTN, PVD  are also affecting patient's functional outcome.   REHAB POTENTIAL: Good  CLINICAL DECISION MAKING: Stable/uncomplicated  EVALUATION COMPLEXITY: Low   GOALS: Goals reviewed with patient? No  SHORT TERM GOALS: Target date: 01/28/2023 Pt will demonstrate appropriate understanding and performance of initially prescribed HEP in order to facilitate improved independence with management of symptoms.  Baseline: HEP provided on eval Goal status: INITIAL   2. Pt will score greater than or equal to 62 on FOTO in order to demonstrate improved perception of function due to symptoms.  Baseline: 59  Goal status: INITIAL    LONG TERM GOALS: Target date: 02/18/2023 Pt will score 66 or greater on FOTO in order to demonstrate improved perception of function due to symptoms. Baseline: 59 Goal status: INITIAL  2.  Pt will demonstrate at least 150 degrees of active shoulder elevation on LUE with less than 2pt increase in pain on NPS in order to demonstrate improved tolerance to functional movement patterns such as reaching overhead. Baseline: see ROM chart above Goal status: INITIAL  3.  Pt will demonstrate at least 5/5 shoulder flexion MMT for improved symmetry of UE strength and improved tolerance to functional movements.  Baseline: see MMT chart above Goal status: INITIAL  4. Pt will report at least 50% decrease in overall pain levels in past week in order to facilitate improved tolerance to basic ADLs/mobility.   Baseline: 0-5/10 in past week  Goal status: INITIAL    5. Pt will endorse ability to perform typical light activities around the home (e.g putting dishes into cabinets) with less than 2pt  increase in pain on NPS.  Baseline: avoiding overhead lifting, up to 5/10 pain   Goal status: INITIAL   PLAN:  PT FREQUENCY: 2x/week  PT DURATION: 6 weeks  PLANNED INTERVENTIONS: Therapeutic exercises, Therapeutic activity, Neuromuscular re-education, Balance  training, Gait training, Patient/Family education, Self Care, Joint mobilization, DME instructions, Aquatic Therapy, Dry Needling, Spinal mobilization, Cryotherapy, Taping, Manual therapy, and Re-evaluation  PLAN FOR NEXT SESSION: Review/update HEP PRN. Work on [employer] exercises as appropriate with emphasis on anterior shoulder strengthening and periscapular endurance. Symptom modification strategies as indicated/appropriate (cancer hx, pacemaker). Mindful of cardiac comorbidities, monitor vitals PRN as pt endorses exertional chest pain (cardiology aware per pt)    Ashley Murrain PT, DPT 01/18/2023 11:43 AM

## 2023-01-18 ENCOUNTER — Encounter: Payer: Self-pay | Admitting: Physical Therapy

## 2023-01-18 ENCOUNTER — Ambulatory Visit (INDEPENDENT_AMBULATORY_CARE_PROVIDER_SITE_OTHER): Payer: Medicare Other | Admitting: Physical Therapy

## 2023-01-18 DIAGNOSIS — R293 Abnormal posture: Secondary | ICD-10-CM | POA: Diagnosis not present

## 2023-01-18 DIAGNOSIS — M6281 Muscle weakness (generalized): Secondary | ICD-10-CM | POA: Diagnosis not present

## 2023-01-18 DIAGNOSIS — M25512 Pain in left shoulder: Secondary | ICD-10-CM | POA: Diagnosis not present

## 2023-01-21 ENCOUNTER — Encounter: Payer: Self-pay | Admitting: Physical Therapy

## 2023-01-21 ENCOUNTER — Ambulatory Visit (INDEPENDENT_AMBULATORY_CARE_PROVIDER_SITE_OTHER): Payer: Medicare Other | Admitting: Physical Therapy

## 2023-01-21 DIAGNOSIS — M25512 Pain in left shoulder: Secondary | ICD-10-CM | POA: Diagnosis not present

## 2023-01-21 DIAGNOSIS — R293 Abnormal posture: Secondary | ICD-10-CM | POA: Diagnosis not present

## 2023-01-21 DIAGNOSIS — M6281 Muscle weakness (generalized): Secondary | ICD-10-CM | POA: Diagnosis not present

## 2023-01-21 NOTE — Therapy (Signed)
OUTPATIENT PHYSICAL THERAPY TREATMENT NOTE   Patient Name: David [name] MRN: [id number] DOB:1933/04/03, 89 y.o., male Today's Date: 01/21/2023  END OF SESSION:  PT End of Session - 01/21/23 1059     Visit Number 5    Number of Visits 13    Date for PT Re-Evaluation 02/18/23    Authorization Type Medicare    Progress Note Due on Visit 10    PT Start Time 1100    PT Stop Time 1138    PT Time Calculation (min) 38 min    Activity Tolerance Patient tolerated treatment well;No increased pain    Behavior During Therapy WFL for tasks assessed/performed               Past Medical History:  Diagnosis Date   Arthritis    "hands" (10/19/2013)   Bilateral carotid bruits 04/08/2020   Right greater than left   Bladder tumor    CAD (coronary artery disease)    cardiologist-  dr crouitoru   Carotid artery disease (HCC)    moderate pRICA 60-79%,  1-39% LICA , >50% RECA last duplex 08-05-2015   Cataract of both eyes    Complete heart block (HCC)    GERD (gastroesophageal reflux disease)    Glaucoma, both eyes    Hematuria    History of MI (myocardial infarction)    1989   Hyperlipidemia    Hypertension    Ischemic cardiomyopathy    ef 40-45%  without clinical heart failure per cardiology note 12-28-2015 (nuclear stress ef 43% from 07-28-2015)   Left ventricular dysfunction    Non Hodgkin's lymphoma (HCC) dx 06/2010---  oncologist-  dr Truett Perna---  in clinical remission since 2013   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 -- lymphadenopathy to neck, mediastinal , and abdominal--  chemotherapy completed 06/ 2011   NSVT (nonsustained ventricular tachycardia) [facility]) cardiologist-  dr crouitoru   Max rate 200 bpm, 28-beat, nearly 9-second episode recorded by pacemaker on November 15, asymptomatic    Pacemaker pacemaker dependant due to CHB   1st Pacemaker Placement 1998 w/ New generator  12-31-2000 ; 08-04-2008;  11-29-2015   PAD (peripheral artery disease) (HCC)     followed by dr berry--- last duplex 08-05-2015  patents bilateral popliteal stents, bilateral SFA 30-49% stenosis   Peripheral vascular disease with claudication (HCC)    dx 2009 left popliteal occlusion with collaterals; 03- 2015  s/p  stenting right popliteal  by dr berry and left popliteal stenting by dr Pernell Dupre ([facility]) july 2015   S/P CABG x 5    09-10-1988   S/P drug eluting coronary stent placement 01/04/2006   01-04-2006--- DES to SVG to OM1, OM2, and IR, and SVG to PDA(x2 grafts)  and 03-04-2013  DES to SVR to OM   Wears glasses    Past Surgical History:  Procedure Laterality Date   ANAL FISSURE REPAIR  12-09-2000 and 1970's   CARDIAC CATHETERIZATION  04/09/2000   patent grafts   CARDIAC PACEMAKER PLACEMENT  11-15-1993   dr Laneta Simmers   CARDIOVASCULAR STRESS TEST  07-28-2015  dr crouitoru   Intermediate risk nuclear study w/ large, severe, predominantly fixed inferior and apical defect; minimal reversibility inferior lateral wall; findings consistent w/ large prior infarct and trivial peri-infarct ischemia;  ef 43% w/ inferior hypokinesis and apical akinesis; mild LVE   CORONARY ANGIOPLASTY WITH STENT PLACEMENT  01-04-2006  dr gamble   DES to pSVG to OM1, SVG to OM2 &  IR, and mSVG to RCA dPDA (x2 grafts );  pLAD , pLCFX and  mRCA occluded;  LIMA to LAD mid-distal patent with occlusion beyond LIMA with collateral flow;  ef 30%   CORONARY ARTERY BYPASS GRAFT  09/10/1988   dr Andrey Campanile   "CABG X5" (03/04/2013) LIMA to LAD,SVG to imtermediate OM1 OM II,SVG to PDA   CYSTOSCOPY W/ RETROGRADES Bilateral 04/13/2016   Procedure: CYSTOSCOPY WITH RETROGRADE PYELOGRAM;  Surgeon: Sebastian Ache, MD;  Location: [facility];  Service: Urology;  Laterality: Bilateral;   EP IMPLANTABLE DEVICE N/A 11/29/2015   Procedure: Pacemaker Implant;  Surgeon: Thurmon Fair, MD;  Location: [facility] INVASIVE CV LAB;  Service: Cardiovascular;  Laterality: N/A;  new generator and new atrial, vertricular leads;   [employer] Accolade   LEFT HEART CATHETERIZATION WITH CORONARY/GRAFT ANGIOGRAM N/A 03/04/2013   Procedure: LEFT HEART CATHETERIZATION WITH Isabel Caprice;  Surgeon: Lennette Bihari, MD;  Location: [facility] CATH LAB;  Service: Cardiovascular;  Laterality: N/A;   LOWER EXTREMITY ANGIOGRAM N/A 08/31/2013   Procedure: LOWER EXTREMITY ANGIOGRAM;  Surgeon: Runell Gess, MD;  Location: [facility] CATH LAB;  Service: Cardiovascular;  Laterality: N/A;   PACEMAKER REPLACEMENT  12/31/2000;  08-04-2008   PERCUTANEOUS CORONARY STENT INTERVENTION (PCI-S)  03/04/2013   Procedure: PERCUTANEOUS CORONARY STENT INTERVENTION (PCI-S);  Surgeon: Lennette Bihari, MD;  Location: [facility] CATH LAB;  Service: Cardiovascular;;  DES to S-OM for in-stent restenosis   POPLITEAL ARTERY STENT Right 10-21-2013  dr berry   atherectomy, PTA , and IDEV stent x1   POPLITEAL ARTERY STENT Left 07/ 2015  dr [facility]ernell [facility]re at [facility]   angioplasty / stent x2   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   SUPERFICIAL LYMPH NODE BIOPSY / EXCISION Left 2011   cervical lymph node   TRANSURETHRAL RESECTION OF BLADDER TUMOR N/A 04/13/2016   Procedure: TRANSURETHRAL RESECTION OF BLADDER TUMOR (TURBT) with clot evacuation;  Surgeon: Sebastian Ache, MD;  Location: [facility];  Service: Urology;  Laterality: N/A;   VENOGRAM Left 11/29/2015   Procedure: Venogram;  Surgeon: Thurmon Fair, MD;  Location: [facility] INVASIVE CV LAB;  Service: Cardiovascular;  Laterality: Left;   Patient Active Problem List   Diagnosis Date Noted   Bilateral carotid bruits 04/08/2020   Acute pain of right shoulder 06/11/2018   CHB (complete heart block) (HCC) 11/29/2015   Pacemaker battery depletion 11/28/2015   Pacemaker lead malfunction 11/28/2015   Pacemaker 10/21/2015   NSVT (nonsustained ventricular tachycardia) (HCC) 06/24/2014   Pseudoaneurysm following procedure (HCC) 10/22/2013   Claudication in peripheral vascular disease (HCC)  10/19/2013   Hematoma of groin -right sided; w/p PV angio 09/11/2013   Claudication- unsuccessful PTA attempt Lt popliteal 08/31/13 08/31/2013   Asymptomatic stenosis of right carotid artery without infarction 06/14/2013   Unstable angina (HCC) 03/05/2013   CAD s/p CABG 1995, s/p stent SVG-OM 2007 and July 2014 03/05/2013   S/P angioplasty with stent SVG-OM 7/30./14 03/05/2013   Complete heart block- [facility] pacemaker '95 with Gen change '02, '09. New leads 2017. Pacer dependednt 03/05/2013   PVD - Lt popliteal occlusion with collaterals '09; rt popliteal successful diamondback orbital rotational atherectomy PTA and stent using an IDEV stent 10/21/13 03/05/2013   Non Hodgkin's lymphoma- Feb 2011- radiation Rx 03/05/2013   Dyslipidemia 03/05/2013   HTN (hypertension) 03/05/2013   Cardiomyopathy, ischemic- EF 40-45% by echo 4/14 03/05/2013   Abnormal nuclear cardiac imaging test- high risk Myoview 02/13/13 03/05/2013    PCP: Clelia Croft,  Rockney Ghee, MD  REFERRING PROVIDER: Tarry Kos, MD  REFERRING DIAG: [phone] (ICD-10-CM) - Chronic left shoulder pain  THERAPY DIAG:  Left shoulder pain, unspecified chronicity  Abnormal posture  Muscle weakness (generalized)  Rationale for Evaluation and Treatment: Rehabilitation  ONSET DATE: 3 months   SUBJECTIVE:                                                                                                                                                                                      SUBJECTIVE STATEMENT: Relays no pain at rest in his shoulder but still hurts when he moves fast with it. Marland Kitchen No chest pain today   Hand dominance: Right  PERTINENT HISTORY: pacer dependent heart block, PVD, angina, CAD s/p CABG, HTN, arrhythmias, non hodgkins lymphoma s/p radiation  PAIN:  Are you having pain: none at rest, 5 with activity Location/description: L shoulder, lateral proximal to elbow  Best-worst over past week: 0-5/10  - aggravating factors:  lifting, quick movements, lifting overhead (especially to side) - Easing factors: rest    PRECAUTIONS: pacemaker, cardiac hx, cancer hx, exertional chest pain (cardiology aware per pt)  WEIGHT BEARING RESTRICTIONS: No  FALLS:  Has patient fallen in last 6 months? No  LIVING ENVIRONMENT: W/ wife and daughter, 3 story, bed/bath are on second floor, tries to avoid stairs Family does majority of housework    OCCUPATION: Retired Financial controller, Insurance account manager  PLOF: Independent  PATIENT GOALS: less pain, be able to lift more and do his activities more freely, reach overhead (plate on top shelf)  NEXT MD VISIT: TBD/PRN, cardiology in July   OBJECTIVE: (objective measures completed at initial evaluation unless otherwise dated)   DIAGNOSTIC FINDINGS:  12/25/22 L shoulder XR without acute abnormalities, refer to EPIC for details  PATIENT SURVEYS:  FOTO 59 current, 66 predicted in 10 visits  COGNITION: Overall cognitive status: Within functional limits for tasks assessed     SENSATION: WFL BIL UE   POSTURE: BIL shoulder IR, fwd head posture, increased kyphosis  UPPER EXTREMITY ROM:  A/PROM Right eval Left eval Left 01/18/23  Shoulder flexion 150 deg 140 deg pain 146 deg mild pain   Shoulder abduction 115 deg 112 deg pain   Shoulder internal rotation     Shoulder external rotation (functional combo) C6 C6 painful    Elbow flexion     Elbow extension     Wrist flexion     Wrist extension      (Blank rows = not tested) (Key: WFL = within functional limits not formally assessed, * = concordant pain, s = stiffness/stretching sensation, NT = not tested)  Comments:    UPPER EXTREMITY  MMT:  MMT Right eval Left eval  Shoulder flexion 5 4+ *  Shoulder extension    Shoulder abduction 5 5  Shoulder internal rotation 5 5  Shoulder external rotation 5 5  Elbow flexion 5 5  Elbow extension 5 5  Grip strength    (Blank rows = not tested)  (Key: WFL = within functional limits not  formally assessed, * = concordant pain, s = stiffness/stretching sensation, NT = not tested)  Comments:   SHOULDER SPECIAL TESTS: Positive neer's and hawkin's kennedy on L, negative on R  JOINT MOBILITY TESTING:  NT  PALPATION:  LS/UT tenderness, anterior deltoid/biceps tenderness on LUE   TODAY'S TREATMENT:                                                                                                                                         PT Treatment:                                                DATE:  01/21/23 Therapeutic Exercise: UBE L3, 3 min fwd, 3 min retro Shoulder pulleys 2 min flexion, 2 min abduction Left shoulder posterior capsule stretch 10 sec X 10 LS stretch towards R only 3x20sec shoulder rows green 2X10 Shoulder extensions green 2X10 Shoulder IR red 2X10 Shoulder ER red 2X10 Shoulder flexion AAROM 2# bar 2 X 10 Shoulder scaption AAROM 2#  X 10 (limited by pain with this one) Shoulder ER AAROM 2 # X 10  [facility] Adult PT Treatment:                                                DATE: 01/18/23 Therapeutic Exercise: GTB row 3x8 cues for form GTB shoulder ext 3x8 cues for posture GH flexion standing AAROM w/ 2# wate bar 2x8 cues for comfortable  L posterior capsule stretch 5x10sec hold  Shoulder abduction AAROM w/ 2# wate bar 2x8 Shoulder flexion swiss ball rollout, standing x12 cues for comfortable ROM  Verbal HEP review + education, relevant anatomy/physiology    PATIENT EDUCATION: Education details: rationale for interventions, HEP  Person educated: Patient Education method: Programmer, multimedia, Demonstration, Tactile cues, Verbal cues, and Handouts Education comprehension: verbalized understanding, returned demonstration, verbal cues required, tactile cues required, and needs further education    HOME EXERCISE PROGRAM: Access Code: [email or web address] URL: [email or web address] Date: 01/09/2023 Prepared by: Ivery Quale  Exercises - Seated Scapular  Retraction  - 1 x daily - 7 x weekly - 3 sets - 10 reps - Gentle Levator Scapulae Stretch  - 1 x daily - 7 x weekly - 1-3 sets - 1-3 reps - 30sec hold -  Isometric Shoulder Flexion at Wall  - 1 x daily - 7 x weekly - 3 sets - 5 reps - 5sec hold  Added below execise on 01/09/23 - Standing Shoulder Flexion AAROM with Dowel  - 1 x daily - 7 x weekly - 1-2 sets - 10 reps - 2 sec hold  ASSESSMENT:  CLINICAL IMPRESSION: He had overall good tolerance to exercise progressions today focused on his left shoulder. He does get some complaints with abduction activity so we modified to scaption plane which helped some. He is improving with his abillity to lift his arm into flexion with less pain. PT recommending to continue with current plan.    OBJECTIVE IMPAIRMENTS: decreased activity tolerance, decreased mobility, decreased ROM, decreased strength, impaired perceived functional ability, impaired UE functional use, postural dysfunction, and pain.   ACTIVITY LIMITATIONS: carrying, lifting, and reach over head  PARTICIPATION LIMITATIONS: meal prep and cleaning  PERSONAL FACTORS: Age, Time since onset of injury/illness/exacerbation, and 3+ comorbidities: cardiac issues (including pacemaker), hx cancer, HTN, PVD  are also affecting patient's functional outcome.   REHAB POTENTIAL: Good  CLINICAL DECISION MAKING: Stable/uncomplicated  EVALUATION COMPLEXITY: Low   GOALS: Goals reviewed with patient? No  SHORT TERM GOALS: Target date: 01/28/2023 Pt will demonstrate appropriate understanding and performance of initially prescribed HEP in order to facilitate improved independence with management of symptoms.  Baseline: HEP provided on eval Goal status: ongoing  2. Pt will score greater than or equal to 62 on FOTO in order to demonstrate improved perception of function due to symptoms.  Baseline: 59  Goal status: ongoing   LONG TERM GOALS: Target date: 02/18/2023 Pt will score 66 or greater on FOTO in  order to demonstrate improved perception of function due to symptoms. Baseline: 59 Goal status: ongoing   2.  Pt will demonstrate at least 150 degrees of active shoulder elevation on LUE with less than 2pt increase in pain on NPS in order to demonstrate improved tolerance to functional movement patterns such as reaching overhead. Baseline: see ROM chart above Goal status: ongoing   3.  Pt will demonstrate at least 5/5 shoulder flexion MMT for improved symmetry of UE strength and improved tolerance to functional movements.  Baseline: see MMT chart above Goal status: ongoing   4. Pt will report at least 50% decrease in overall pain levels in past week in order to facilitate improved tolerance to basic ADLs/mobility.   Baseline: 0-5/10 in past week  Goal status: ongoing   5. Pt will endorse ability to perform typical light activities around the home (e.g putting dishes into cabinets) with less than 2pt increase in pain on NPS.  Baseline: avoiding overhead lifting, up to 5/10 pain   Goal status: ongoing   PLAN:  PT FREQUENCY: 2x/week  PT DURATION: 6 weeks  PLANNED INTERVENTIONS: Therapeutic exercises, Therapeutic activity, Neuromuscular re-education, Balance training, Gait training, Patient/Family education, Self Care, Joint mobilization, DME instructions, Aquatic Therapy, Dry Needling, Spinal mobilization, Cryotherapy, Taping, Manual therapy, and Re-evaluation  PLAN FOR NEXT SESSION: Review/update HEP PRN. Work on [employer] exercises as appropriate with emphasis on anterior shoulder strengthening and periscapular endurance. Symptom modification strategies as indicated/appropriate (cancer hx, pacemaker). Mindful of cardiac comorbidities, monitor vitals PRN as pt endorses exertional chest pain (cardiology aware per pt)    Ashley Murrain PT, DPT 01/21/2023 11:40 AM

## 2023-01-23 NOTE — Therapy (Signed)
OUTPATIENT PHYSICAL THERAPY TREATMENT NOTE   Patient Name: David [name] MRN: [id number] DOB:02-Nov-1932, 89 y.o., male Today's Date: 01/23/2023  END OF SESSION:      Past Medical History:  Diagnosis Date   Arthritis    "hands" (10/19/2013)   Bilateral carotid bruits 04/08/2020   Right greater than left   Bladder tumor    CAD (coronary artery disease)    cardiologist-  dr crouitoru   Carotid artery disease (HCC)    moderate pRICA 60-79%,  1-39% LICA , >50% RECA last duplex 08-05-2015   Cataract of both eyes    Complete heart block (HCC)    GERD (gastroesophageal reflux disease)    Glaucoma, both eyes    Hematuria    History of MI (myocardial infarction)    1989   Hyperlipidemia    Hypertension    Ischemic cardiomyopathy    ef 40-45%  without clinical heart failure per cardiology note 12-28-2015 (nuclear stress ef 43% from 07-28-2015)   Left ventricular dysfunction    Non Hodgkin's lymphoma (HCC) dx 06/2010---  oncologist-  dr Truett Perna---  in clinical remission since 2013   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 -- lymphadenopathy to neck, mediastinal , and abdominal--  chemotherapy completed 06/ 2011   NSVT (nonsustained ventricular tachycardia) [facility]) cardiologist-  dr crouitoru   Max rate 200 bpm, 28-beat, nearly 9-second episode recorded by pacemaker on November 15, asymptomatic    Pacemaker pacemaker dependant due to CHB   1st Pacemaker Placement 1998 w/ New generator  12-31-2000 ; 08-04-2008;  11-29-2015   PAD (peripheral artery disease) (HCC)    followed by dr berry--- last duplex 08-05-2015  patents bilateral popliteal stents, bilateral SFA 30-49% stenosis   Peripheral vascular disease with claudication (HCC)    dx 2009 left popliteal occlusion with collaterals; 03- 2015  s/p  stenting right popliteal  by dr berry and left popliteal stenting by dr Pernell Dupre ([facility]) july 2015   S/P CABG x 5    09-10-1988   S/P drug eluting coronary stent  placement 01/04/2006   01-04-2006--- DES to SVG to OM1, OM2, and IR, and SVG to PDA(x2 grafts)  and 03-04-2013  DES to SVR to OM   Wears glasses    Past Surgical History:  Procedure Laterality Date   ANAL FISSURE REPAIR  12-09-2000 and 1970's   CARDIAC CATHETERIZATION  04/09/2000   patent grafts   CARDIAC PACEMAKER PLACEMENT  11-15-1993   dr Laneta Simmers   CARDIOVASCULAR STRESS TEST  07-28-2015  dr crouitoru   Intermediate risk nuclear study w/ large, severe, predominantly fixed inferior and apical defect; minimal reversibility inferior lateral wall; findings consistent w/ large prior infarct and trivial peri-infarct ischemia;  ef 43% w/ inferior hypokinesis and apical akinesis; mild LVE   CORONARY ANGIOPLASTY WITH STENT PLACEMENT  01-04-2006  dr gamble   DES to pSVG to OM1, SVG to OM2 &  IR, and mSVG to RCA dPDA (x2 grafts );  pLAD , pLCFX and  mRCA occluded;  LIMA to LAD mid-distal patent with occlusion beyond LIMA with collateral flow;  ef 30%   CORONARY ARTERY BYPASS GRAFT  09/10/1988   dr Andrey Campanile   "CABG X5" (03/04/2013) LIMA to LAD,SVG to imtermediate OM1 OM II,SVG to PDA   CYSTOSCOPY W/ RETROGRADES Bilateral 04/13/2016   Procedure: CYSTOSCOPY WITH RETROGRADE PYELOGRAM;  Surgeon: Sebastian Ache, MD;  Location: [facility];  Service: Urology;  Laterality: Bilateral;   EP IMPLANTABLE DEVICE N/A 11/29/2015  Procedure: Pacemaker Implant;  Surgeon: Thurmon Fair, MD;  Location: [facility] INVASIVE CV LAB;  Service: Cardiovascular;  Laterality: N/A;  new generator and new atrial, vertricular leads;  [employer] Accolade   LEFT HEART CATHETERIZATION WITH CORONARY/GRAFT ANGIOGRAM N/A 03/04/2013   Procedure: LEFT HEART CATHETERIZATION WITH Isabel Caprice;  Surgeon: Lennette Bihari, MD;  Location: [facility] CATH LAB;  Service: Cardiovascular;  Laterality: N/A;   LOWER EXTREMITY ANGIOGRAM N/A 08/31/2013   Procedure: LOWER EXTREMITY ANGIOGRAM;  Surgeon: Runell Gess, MD;  Location: [facility] CATH  LAB;  Service: Cardiovascular;  Laterality: N/A;   PACEMAKER REPLACEMENT  12/31/2000;  08-04-2008   PERCUTANEOUS CORONARY STENT INTERVENTION (PCI-S)  03/04/2013   Procedure: PERCUTANEOUS CORONARY STENT INTERVENTION (PCI-S);  Surgeon: Lennette Bihari, MD;  Location: [facility] CATH LAB;  Service: Cardiovascular;;  DES to S-OM for in-stent restenosis   POPLITEAL ARTERY STENT Right 10-21-2013  dr berry   atherectomy, PTA , and IDEV stent x1   POPLITEAL ARTERY STENT Left 07/ 2015  dr Pernell Dupre at [facility]   angioplasty / stent x2   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   SUPERFICIAL LYMPH NODE BIOPSY / EXCISION Left 2011   cervical lymph node   TRANSURETHRAL RESECTION OF BLADDER TUMOR N/A 04/13/2016   Procedure: TRANSURETHRAL RESECTION OF BLADDER TUMOR (TURBT) with clot evacuation;  Surgeon: Sebastian Ache, MD;  Location: [facility];  Service: Urology;  Laterality: N/A;   VENOGRAM Left 11/29/2015   Procedure: Venogram;  Surgeon: Thurmon Fair, MD;  Location: [facility] INVASIVE CV LAB;  Service: Cardiovascular;  Laterality: Left;   Patient Active Problem List   Diagnosis Date Noted   Bilateral carotid bruits 04/08/2020   Acute pain of right shoulder 06/11/2018   CHB (complete heart block) (HCC) 11/29/2015   Pacemaker battery depletion 11/28/2015   Pacemaker lead malfunction 11/28/2015   Pacemaker 10/21/2015   NSVT (nonsustained ventricular tachycardia) (HCC) 06/24/2014   Pseudoaneurysm following procedure (HCC) 10/22/2013   Claudication in peripheral vascular disease (HCC) 10/19/2013   Hematoma of groin -right sided; w/p PV angio 09/11/2013   Claudication- unsuccessful PTA attempt Lt popliteal 08/31/13 08/31/2013   Asymptomatic stenosis of right carotid artery without infarction 06/14/2013   Unstable angina (HCC) 03/05/2013   CAD s/p CABG 1995, s/p stent SVG-OM 2007 and July 2014 03/05/2013   S/P angioplasty with stent SVG-OM 7/30./14 03/05/2013   Complete heart block- [facility]  pacemaker '95 with Gen change '02, '09. New leads 2017. Pacer dependednt 03/05/2013   PVD - Lt popliteal occlusion with collaterals '09; rt popliteal successful diamondback orbital rotational atherectomy PTA and stent using an IDEV stent 10/21/13 03/05/2013   Non Hodgkin's lymphoma- Feb 2011- radiation Rx 03/05/2013   Dyslipidemia 03/05/2013   HTN (hypertension) 03/05/2013   Cardiomyopathy, ischemic- EF 40-45% by echo 4/14 03/05/2013   Abnormal nuclear cardiac imaging test- high risk Myoview 02/13/13 03/05/2013    PCP: Lupita Raider, MD  REFERRING PROVIDER: Tarry Kos, MD  REFERRING DIAG: [phone] (ICD-10-CM) - Chronic left shoulder pain  THERAPY DIAG:  No diagnosis found.  Rationale for Evaluation and Treatment: Rehabilitation  ONSET DATE: 3 months   SUBJECTIVE:  SUBJECTIVE STATEMENT: ***  *** Relays no pain at rest in his shoulder but still hurts when he moves fast with it. Marland Kitchen No chest pain today   Hand dominance: Right  PERTINENT HISTORY: pacer dependent heart block, PVD, angina, CAD s/p CABG, HTN, arrhythmias, non hodgkins lymphoma s/p radiation  PAIN:  Are you having pain: none at rest, 5 with activity Location/description: L shoulder, lateral proximal to elbow  Best-worst over past week: 0-5/10  - aggravating factors: lifting, quick movements, lifting overhead (especially to side) - Easing factors: rest    PRECAUTIONS: pacemaker, cardiac hx, cancer hx, exertional chest pain (cardiology aware per pt)  WEIGHT BEARING RESTRICTIONS: No  FALLS:  Has patient fallen in last 6 months? No  LIVING ENVIRONMENT: W/ wife and daughter, 3 story, bed/bath are on second floor, tries to avoid stairs Family does majority of housework    OCCUPATION: Retired Financial controller, Insurance account manager  PLOF:  Independent  PATIENT GOALS: less pain, be able to lift more and do his activities more freely, reach overhead (plate on top shelf)  NEXT MD VISIT: TBD/PRN, cardiology in July   OBJECTIVE: (objective measures completed at initial evaluation unless otherwise dated)   DIAGNOSTIC FINDINGS:  12/25/22 L shoulder XR without acute abnormalities, refer to EPIC for details  PATIENT SURVEYS:  FOTO 59 current, 66 predicted in 10 visits  COGNITION: Overall cognitive status: Within functional limits for tasks assessed     SENSATION: WFL BIL UE   POSTURE: BIL shoulder IR, fwd head posture, increased kyphosis  UPPER EXTREMITY ROM:  A/PROM Right eval Left eval Left 01/18/23  Shoulder flexion 150 deg 140 deg pain 146 deg mild pain   Shoulder abduction 115 deg 112 deg pain   Shoulder internal rotation     Shoulder external rotation (functional combo) C6 C6 painful    Elbow flexion     Elbow extension     Wrist flexion     Wrist extension      (Blank rows = not tested) (Key: WFL = within functional limits not formally assessed, * = concordant pain, s = stiffness/stretching sensation, NT = not tested)  Comments:    UPPER EXTREMITY MMT:  MMT Right eval Left eval  Shoulder flexion 5 4+ *  Shoulder extension    Shoulder abduction 5 5  Shoulder internal rotation 5 5  Shoulder external rotation 5 5  Elbow flexion 5 5  Elbow extension 5 5  Grip strength    (Blank rows = not tested)  (Key: WFL = within functional limits not formally assessed, * = concordant pain, s = stiffness/stretching sensation, NT = not tested)  Comments:   SHOULDER SPECIAL TESTS: Positive neer's and hawkin's kennedy on L, negative on R  JOINT MOBILITY TESTING:  NT  PALPATION:  LS/UT tenderness, anterior deltoid/biceps tenderness on LUE   TODAY'S TREATMENT:  [facility] Adult PT  Treatment:                                                DATE: 01/24/23 Therapeutic Exercise: *** Manual Therapy: *** Neuromuscular re-ed: *** Therapeutic Activity: *** Modalities: *** Self Care: ***   PT Treatment:                                                DATE:  01/21/23 Therapeutic Exercise: UBE L3, 3 min fwd, 3 min retro Shoulder pulleys 2 min flexion, 2 min abduction Left shoulder posterior capsule stretch 10 sec X 10 LS stretch towards R only 3x20sec shoulder rows green 2X10 Shoulder extensions green 2X10 Shoulder IR red 2X10 Shoulder ER red 2X10 Shoulder flexion AAROM 2# bar 2 X 10 Shoulder scaption AAROM 2#  X 10 (limited by pain with this one) Shoulder ER AAROM 2 # X 10  [facility] Adult PT Treatment:                                                DATE: 01/18/23 Therapeutic Exercise: GTB row 3x8 cues for form GTB shoulder ext 3x8 cues for posture GH flexion standing AAROM w/ 2# wate bar 2x8 cues for comfortable  L posterior capsule stretch 5x10sec hold  Shoulder abduction AAROM w/ 2# wate bar 2x8 Shoulder flexion swiss ball rollout, standing x12 cues for comfortable ROM  Verbal HEP review + education, relevant anatomy/physiology    PATIENT EDUCATION: Education details: rationale for interventions, HEP  Person educated: Patient Education method: Programmer, multimedia, Demonstration, Tactile cues, Verbal cues, and Handouts Education comprehension: verbalized understanding, returned demonstration, verbal cues required, tactile cues required, and needs further education    HOME EXERCISE PROGRAM: Access Code: [email or web address] URL: [email or web address] Date: 01/09/2023 Prepared by: Ivery Quale  Exercises - Seated Scapular Retraction  - 1 x daily - 7 x weekly - 3 sets - 10 reps - Gentle Levator Scapulae Stretch  - 1 x daily - 7 x weekly - 1-3 sets - 1-3 reps - 30sec hold - Isometric Shoulder Flexion at Wall  - 1 x daily - 7 x weekly - 3 sets - 5 reps - 5sec  hold  Added below execise on 01/09/23 - Standing Shoulder Flexion AAROM with Dowel  - 1 x daily - 7 x weekly - 1-2 sets - 10 reps - 2 sec hold  ASSESSMENT:  CLINICAL IMPRESSION: ***  *** He had overall good tolerance to exercise progressions today focused on his left shoulder. He does get some complaints with abduction activity so we modified to scaption plane which helped some. He is improving with his abillity to lift his arm into flexion with less pain. PT recommending to continue with current plan.    OBJECTIVE IMPAIRMENTS: decreased activity tolerance, decreased mobility, decreased ROM, decreased strength, impaired perceived functional ability, impaired UE functional use, postural dysfunction, and pain.   ACTIVITY LIMITATIONS: carrying, lifting, and reach over head  PARTICIPATION LIMITATIONS: meal prep and cleaning  PERSONAL FACTORS: Age, Time since onset of injury/illness/exacerbation, and 3+ comorbidities: cardiac issues (including pacemaker), hx cancer, HTN,  PVD  are also affecting patient's functional outcome.   REHAB POTENTIAL: Good  CLINICAL DECISION MAKING: Stable/uncomplicated  EVALUATION COMPLEXITY: Low   GOALS: Goals reviewed with patient? No  SHORT TERM GOALS: Target date: 01/28/2023 Pt will demonstrate appropriate understanding and performance of initially prescribed HEP in order to facilitate improved independence with management of symptoms.  Baseline: HEP provided on eval Goal status: ongoing  2. Pt will score greater than or equal to 62 on FOTO in order to demonstrate improved perception of function due to symptoms.  Baseline: 59  Goal status: ongoing   LONG TERM GOALS: Target date: 02/18/2023 Pt will score 66 or greater on FOTO in order to demonstrate improved perception of function due to symptoms. Baseline: 59 Goal status: ongoing   2.  Pt will demonstrate at least 150 degrees of active shoulder elevation on LUE with less than 2pt increase in pain on  NPS in order to demonstrate improved tolerance to functional movement patterns such as reaching overhead. Baseline: see ROM chart above Goal status: ongoing   3.  Pt will demonstrate at least 5/5 shoulder flexion MMT for improved symmetry of UE strength and improved tolerance to functional movements.  Baseline: see MMT chart above Goal status: ongoing   4. Pt will report at least 50% decrease in overall pain levels in past week in order to facilitate improved tolerance to basic ADLs/mobility.   Baseline: 0-5/10 in past week  Goal status: ongoing   5. Pt will endorse ability to perform typical light activities around the home (e.g putting dishes into cabinets) with less than 2pt increase in pain on NPS.  Baseline: avoiding overhead lifting, up to 5/10 pain   Goal status: ongoing   PLAN:  PT FREQUENCY: 2x/week  PT DURATION: 6 weeks  PLANNED INTERVENTIONS: Therapeutic exercises, Therapeutic activity, Neuromuscular re-education, Balance training, Gait training, Patient/Family education, Self Care, Joint mobilization, DME instructions, Aquatic Therapy, Dry Needling, Spinal mobilization, Cryotherapy, Taping, Manual therapy, and Re-evaluation  PLAN FOR NEXT SESSION: Review/update HEP PRN. Work on [employer] exercises as appropriate with emphasis on anterior shoulder strengthening and periscapular endurance. Symptom modification strategies as indicated/appropriate (cancer hx, pacemaker). Mindful of cardiac comorbidities, monitor vitals PRN as pt endorses exertional chest pain (cardiology aware per pt)    Ashley Murrain PT, DPT 01/23/2023 8:49 AM

## 2023-01-24 ENCOUNTER — Encounter: Payer: Self-pay | Admitting: Physical Therapy

## 2023-01-24 ENCOUNTER — Ambulatory Visit (INDEPENDENT_AMBULATORY_CARE_PROVIDER_SITE_OTHER): Payer: Medicare Other | Admitting: Physical Therapy

## 2023-01-24 DIAGNOSIS — M25512 Pain in left shoulder: Secondary | ICD-10-CM

## 2023-01-24 DIAGNOSIS — M6281 Muscle weakness (generalized): Secondary | ICD-10-CM

## 2023-01-24 DIAGNOSIS — R293 Abnormal posture: Secondary | ICD-10-CM

## 2023-01-28 ENCOUNTER — Encounter: Payer: Medicare Other | Admitting: Physical Therapy

## 2023-01-28 NOTE — Progress Notes (Signed)
Remote pacemaker transmission.   

## 2023-01-30 ENCOUNTER — Telehealth: Payer: Self-pay | Admitting: Cardiovascular Disease

## 2023-01-30 NOTE — Telephone Encounter (Signed)
Patient wants to know if he will need to do a stress test before he has his visit on 7/11.

## 2023-01-30 NOTE — Telephone Encounter (Signed)
I don't think so - not unless his symptoms have changed. Will discuss at the appointment

## 2023-01-30 NOTE — Telephone Encounter (Signed)
Patient aware he does not need a stress test. He stated he will have more chest pain than usual. He stated but since you increased IMDUR it has been getting better

## 2023-01-31 ENCOUNTER — Other Ambulatory Visit: Payer: Self-pay | Admitting: Cardiovascular Disease

## 2023-01-31 ENCOUNTER — Encounter: Payer: Medicare Other | Admitting: Physical Therapy

## 2023-02-04 ENCOUNTER — Encounter: Payer: Medicare Other | Admitting: Physical Therapy

## 2023-02-06 ENCOUNTER — Encounter: Payer: Medicare Other | Admitting: Physical Therapy

## 2023-02-14 ENCOUNTER — Encounter: Payer: Self-pay | Admitting: Cardiovascular Disease

## 2023-02-14 ENCOUNTER — Ambulatory Visit: Payer: Medicare Other | Attending: Cardiovascular Disease | Admitting: Cardiovascular Disease

## 2023-02-14 VITALS — BP 116/62 | HR 64 | Ht 69.5 in | Wt 196.8 lb

## 2023-02-14 DIAGNOSIS — E785 Hyperlipidemia, unspecified: Secondary | ICD-10-CM | POA: Insufficient documentation

## 2023-02-14 DIAGNOSIS — I5022 Chronic systolic (congestive) heart failure: Secondary | ICD-10-CM

## 2023-02-14 DIAGNOSIS — I739 Peripheral vascular disease, unspecified: Secondary | ICD-10-CM | POA: Insufficient documentation

## 2023-02-14 DIAGNOSIS — Z950 Presence of cardiac pacemaker: Secondary | ICD-10-CM

## 2023-02-14 DIAGNOSIS — I10 Essential (primary) hypertension: Secondary | ICD-10-CM | POA: Diagnosis not present

## 2023-02-14 DIAGNOSIS — Z8572 Personal history of non-Hodgkin lymphomas: Secondary | ICD-10-CM

## 2023-02-14 DIAGNOSIS — I200 Unstable angina: Secondary | ICD-10-CM

## 2023-02-14 DIAGNOSIS — I6521 Occlusion and stenosis of right carotid artery: Secondary | ICD-10-CM | POA: Diagnosis not present

## 2023-02-14 DIAGNOSIS — I4729 Other ventricular tachycardia: Secondary | ICD-10-CM

## 2023-02-14 DIAGNOSIS — I255 Ischemic cardiomyopathy: Secondary | ICD-10-CM | POA: Diagnosis not present

## 2023-02-14 DIAGNOSIS — I442 Atrioventricular block, complete: Secondary | ICD-10-CM | POA: Insufficient documentation

## 2023-02-14 DIAGNOSIS — I25118 Atherosclerotic heart disease of native coronary artery with other forms of angina pectoris: Secondary | ICD-10-CM | POA: Insufficient documentation

## 2023-02-14 MED ORDER — LISINOPRIL 10 MG PO TABS: 10.0000 mg | ORAL_TABLET | Freq: Every day | ORAL | 3 refills | Status: DC

## 2023-02-14 MED ORDER — NITROGLYCERIN 0.4 MG SL SUBL: 0.4000 mg | SUBLINGUAL_TABLET | SUBLINGUAL | 1 refills | Status: DC | PRN

## 2023-02-14 MED ORDER — AMLODIPINE BESYLATE 2.5 MG PO TABS: 2.5000 mg | ORAL_TABLET | Freq: Every day | ORAL | 3 refills | Status: DC

## 2023-02-14 NOTE — Patient Instructions (Signed)
Medication Instructions:  Start Amlodipine 2.5 mg daily Decrease Lisinopril to 10 mg daily *If you need a refill on your cardiac medications before your next appointment, please call your pharmacy*  Testing/Procedures: CARDIAC PET- Your physician has requested that you have a Cardiac Pet Stress Test. This testing is completed at [facility] ([address] [place], [place] [address]). The schedulers will call you to get this scheduled. Please follow instructions below and call the office with any questions/concerns [phone]).   How to Prepare for Your Cardiac PET/CT Stress Test:  1. Please do not take these medications before your test:   Medications that may interfere with the cardiac pharmacological stress agent (ex. nitrates - including erectile dysfunction medications, isosorbide mononitrate, tamulosin or beta-blockers) the day of the exam. (Erectile dysfunction medication should be held for at least 72 hrs prior to test) Theophylline containing medications for 12 hours. Dipyridamole 48 hours prior to the test. Your remaining medications may be taken with water.  2. Nothing to eat or drink, except water, 3 hours prior to arrival time.   NO caffeine/decaffeinated products, or chocolate 12 hours prior to arrival.  3. NO perfume, cologne or lotion  4. Total time is 1 to 2 hours; you may want to bring reading material for the waiting time.  5. Please report to Radiology at the [facility] Main Entrance 30 minutes early for your test.  [address] [place], [place] [address]  Diabetic Preparation:  Hold oral medications. You may take NPH and Lantus insulin. Do not take Humalog or Humulin R (Regular Insulin) the day of your test. Check blood sugars prior to leaving the house. If able to eat breakfast prior to 3 hour fasting, you may take all medications, including your insulin, Do not worry if you miss your breakfast dose of insulin - start at  your next meal.  IF YOU THINK YOU MAY BE PREGNANT, OR ARE NURSING PLEASE INFORM THE TECHNOLOGIST.  In preparation for your appointment, medication and supplies will be purchased.  Appointment availability is limited, so if you need to cancel or reschedule, please call the Radiology Department at [phone]  24 hours in advance to avoid a cancellation fee of $100.00  What to Expect After you Arrive:  Once you arrive and check in for your appointment, you will be taken to a preparation room within the Radiology Department.  A technologist or Nurse will obtain your medical history, verify that you are correctly prepped for the exam, and explain the procedure.  Afterwards,  an IV will be started in your arm and electrodes will be placed on your skin for EKG monitoring during the stress portion of the exam. Then you will be escorted to the PET/CT scanner.  There, staff will get you positioned on the scanner and obtain a blood pressure and EKG.  During the exam, you will continue to be connected to the EKG and blood pressure machines.  A small, safe amount of a radioactive tracer will be injected in your IV to obtain a series of pictures of your heart along with an injection of a stress agent.    After your Exam:  It is recommended that you eat a meal and drink a caffeinated beverage to counter act any effects of the stress agent.  Drink plenty of fluids for the remainder of the day and urinate frequently for the first couple of hours after the exam.  Your doctor will inform you of your test  results within 7-10 business days.  For more information and frequently asked questions, please visit our website : [email or web address]  For questions about your test or how to prepare for your test, please call: Rockwell Alexandria, Cardiac Imaging Nurse Navigator  Larey Brick, Cardiac Imaging Nurse Navigator Office: [phone]    Follow-Up: At [facility], you and your health needs are  our priority.  As part of our continuing mission to provide you with exceptional heart care, we have created designated Provider Care Teams.  These Care Teams include your primary Cardiologist (physician) and Advanced Practice Providers (APPs -  Physician Assistants and Nurse Practitioners) who all work together to provide you with the care you need, when you need it.  We recommend signing up for the patient portal called "MyChart".  Sign up information is provided on this After Visit Summary.  MyChart is used to connect with patients for Virtual Visits (Telemedicine).  Patients are able to view lab/test results, encounter notes, upcoming appointments, etc.  Non-urgent messages can be sent to your provider as well.   To learn more about what you can do with MyChart, go to [email or web address].    Your next appointment:   3 month(s)  Provider:   Thurmon Fair, MD

## 2023-02-14 NOTE — Progress Notes (Signed)
Patient ID: David [name], male   DOB: 1933/06/24, 89 y.o.   MRN: [id number] Patient ID: David [name], male   DOB: 11-11-1932, 89 y.o.   MRN: [id number]    Cardiology Office Note    Date:  02/17/2023   ID:  David [name], DOB Mar 07, 1933, MRN [id number]  PCP:  David Raider, MD  Cardiologist:  David Batty, MD (PAD);  David Fair, MD   Chief Complaint  Patient presents with   Chest Pain     History of Present Illness:  David [name] is a 89 y.o. male David [name] is a 89 y.o. male with complete heart block, coronary artery disease, PAD of the lower extremities (has occluded popliteal stents in the left lower extremity), symptomatic PVCs (and one episode of lengthy nonsustained VT detected by his pacemaker).  He has had slowly worsening angina.  He now has CCS functional class III angina pectoris.  He gets chest discomfort when he makes the bed or climbs up a few stairs.  The angina is relatively mild and resolves promptly with rest or sublingual nitroglycerin.  He takes nitroglycerin on the average about twice a week.  He is already on pretty aggressive antianginal therapy with metoprolol tartrate 100 mg twice daily, a total of 150 mg of isosorbide mononitrate daily.  In the past, adding amlodipine did not really help and this medication has been since discontinued.  He is on aspirin and cilostazol for PAD and is on maximum dose rosuvastatin with a most recent LDL cholesterol of 66.  He has what sounds like neurogenic claudication.  He gets bilateral leg pain when standing at the sink as well as when walking longer distances.  Symptoms sometimes also occur even when lying in the bed.  She denies orthopnea, PND, lower extremity edema, palpitations, syncope.  He has not had falls or bleeding problems.  Pacemaker interrogation shows normal device function. He has a Radiation protection practitioner dual-chamber pacemaker that was implanted as new system with a new generator and leads in 2017, when  his old device with unipolar leads reached end of service.  The old leads were left in place so even though his system is MRI compatible, he should not have an MRI due to abandoned leads.    He has 67% atrial pacing and 97% ventricular pacing.  He has occasional episodes of ectopic atrial tachycardia lasting only 10-15 beats, with variable AV conduction.  He has not had any atrial fibrillation.  His histograms look a little bit blunted, but his activity is limited by his back pain and chest discomfort.    01/18/2021 SPECT did not show reversible ischemia, confirmed the known inferior and apical scar.  EF 36%.  However, his most recent echocardiogram performed 02/23/2021 showed LVEF 45-50% which was an improvement from previous evaluation (has apical akinesis).    His most recent lipid profile in January 2024 shows total cholesterol 142, LDL 66, HDL 46 TG 178.  His creatinine is 1.19 and his liver function tests were normal.    [employer] EL David model number [id number], serial number [id number] Right atrial lead (new) [employer] MRI, model number 7741, serial number [id number] Right ventricular lead (new)  [employer] MRI, model number [id number], serial number [id number]. Old unipolar leads are still in place so that he is not MRI conditional despite the fact that the individual components of his new system are MRI conditional.  [name] has had numerous coronary revascularization procedures,  most recently a stent in the saphenous vein graft to the oblique marginal artery in July 2014. He is now almost 20 years status post coronary bypass surgery. The same bypass graft had received a stent in 2007. He has mild ischemic cardiomyopathy with an ejection fraction of 40-45%, without clinical  heart failure. The nuclear stress test on July 28, 2015 shows old scar in the apex and inferior wall. LVEF is calculated at 43% (identical to 2014 and 2015).   He had occlusion of the left  popliteal artery with distal reconstitution via collaterals. He had successful recanalization with placement of 2 stents by David. Pernell [name] in 2015 . This led to marked improvement in his claudication, but the stents subsequently occluded.  Claudication improved with cilostazol. He is pacemaker dependent secondary to complete heart block. He had a generator changeout in 2017. Interrogation of his device today again shows some issues with "noise" on the atrial channel but none on the ventricular channel.  Moderate carotid artery disease (stable 60-79% proximal internal carotid artery stenosis on the right, mild plaque on the left), last duplex US was February 11, 2015.   He has a history of non-Hodgkin's lymphoma with a large neck mass but also mediastinal and abdominal lymphadenopathy treated with radiotherapy to the cervical area in 2011 followed by chemotherapy with Rituxan and fludarabine. He is considered to be in remission.   He has undergone cystoscopic resection of a bladder tumor and bilateral cataract surgery.    Past Medical History:  Diagnosis Date   Arthritis    "hands" (10/19/2013)   Bilateral carotid bruits 04/08/2020   Right greater than left   Bladder tumor    CAD (coronary artery disease)    cardiologist-  David [name]   Carotid artery disease (HCC)    moderate pRICA 60-79%,  1-39% LICA , >50% RECA last duplex 08-05-2015   Cataract of both eyes    Complete heart block (HCC)    GERD (gastroesophageal reflux disease)    Glaucoma, both eyes    Hematuria    History of MI (myocardial infarction)    1989   Hyperlipidemia    Hypertension    Ischemic cardiomyopathy    ef 40-45%  without clinical heart failure per cardiology note 12-28-2015 (nuclear stress ef 43% from 07-28-2015)   Left ventricular dysfunction    Non Hodgkin's lymphoma (HCC) dx 06/2010---  oncologist-  David [name]---  in clinical remission since 2013   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 --  lymphadenopathy to neck, mediastinal , and abdominal--  chemotherapy completed 06/ 2011   NSVT (nonsustained ventricular tachycardia) [facility]) cardiologist-  David [name]   Max rate 200 bpm, 28-beat, nearly 9-second episode recorded by pacemaker on November 15, asymptomatic    Pacemaker pacemaker dependant due to CHB   1st Pacemaker Placement 1998 w/ New generator  12-31-2000 ; 08-04-2008;  11-29-2015   PAD (peripheral artery disease) (HCC)    followed by David berry--- last duplex 08-05-2015  patents bilateral popliteal stents, bilateral SFA 30-49% stenosis   Peripheral vascular disease with claudication (HCC)    dx 2009 left popliteal occlusion with collaterals; 03- 2015  s/p  stenting right popliteal  by David berry and left popliteal stenting by David David [name] ([facility]) july 2015   S/P CABG x 5    09-10-1988   S/P drug eluting coronary stent placement 01/04/2006   01-04-2006--- DES to SVG to OM1, OM2, and IR, and SVG to PDA(x2 grafts)  and 03-04-2013  DES to SVR to OM   Wears glasses     Past Surgical History:  Procedure Laterality Date   ANAL FISSURE REPAIR  12-09-2000 and 1970's   CARDIAC CATHETERIZATION  04/09/2000   patent grafts   CARDIAC PACEMAKER PLACEMENT  11-15-1993   David Laneta Simmers   CARDIOVASCULAR STRESS TEST  07-28-2015  David [name]   Intermediate risk nuclear study w/ large, severe, predominantly fixed inferior and apical defect; minimal reversibility inferior lateral wall; findings consistent w/ large prior infarct and trivial peri-infarct ischemia;  ef 43% w/ inferior hypokinesis and apical akinesis; mild LVE   CORONARY ANGIOPLASTY WITH STENT PLACEMENT  01-04-2006  David gamble   DES to pSVG to OM1, SVG to OM2 &  IR, and mSVG to RCA dPDA (x2 grafts );  pLAD , pLCFX and  mRCA occluded;  LIMA to LAD mid-distal patent with occlusion beyond LIMA with collateral flow;  ef 30%   CORONARY ARTERY BYPASS GRAFT  09/10/1988   David Andrey Campanile   "CABG X5" (03/04/2013) LIMA to LAD,SVG to imtermediate OM1 OM  II,SVG to PDA   CYSTOSCOPY W/ RETROGRADES Bilateral 04/13/2016   Procedure: CYSTOSCOPY WITH RETROGRADE PYELOGRAM;  Surgeon: Sebastian Ache, MD;  Location: [facility];  Service: Urology;  Laterality: Bilateral;   EP IMPLANTABLE DEVICE N/A 11/29/2015   Procedure: Pacemaker Implant;  Surgeon: David Fair, MD;  Location: [facility] INVASIVE CV LAB;  Service: Cardiovascular;  Laterality: N/A;  new generator and new atrial, vertricular leads;  [employer] Accolade   LEFT HEART CATHETERIZATION WITH CORONARY/GRAFT ANGIOGRAM N/A 03/04/2013   Procedure: LEFT HEART CATHETERIZATION WITH Isabel Caprice;  Surgeon: Lennette Bihari, MD;  Location: [facility] CATH LAB;  Service: Cardiovascular;  Laterality: N/A;   LOWER EXTREMITY ANGIOGRAM N/A 08/31/2013   Procedure: LOWER EXTREMITY ANGIOGRAM;  Surgeon: Runell Gess, MD;  Location: [facility] CATH LAB;  Service: Cardiovascular;  Laterality: N/A;   PACEMAKER REPLACEMENT  12/31/2000;  08-04-2008   PERCUTANEOUS CORONARY STENT INTERVENTION (PCI-S)  03/04/2013   Procedure: PERCUTANEOUS CORONARY STENT INTERVENTION (PCI-S);  Surgeon: Lennette Bihari, MD;  Location: [facility] CATH LAB;  Service: Cardiovascular;;  DES to S-OM for in-stent restenosis   POPLITEAL ARTERY STENT Right 10-21-2013  David berry   atherectomy, PTA , and IDEV stent x1   POPLITEAL ARTERY STENT Left 07/ 2015  David [name] at [facility]   angioplasty / stent x2   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   SUPERFICIAL LYMPH NODE BIOPSY / EXCISION Left 2011   cervical lymph node   TRANSURETHRAL RESECTION OF BLADDER TUMOR N/A 04/13/2016   Procedure: TRANSURETHRAL RESECTION OF BLADDER TUMOR (TURBT) with clot evacuation;  Surgeon: Sebastian Ache, MD;  Location: [facility];  Service: Urology;  Laterality: N/A;   VENOGRAM Left 11/29/2015   Procedure: Venogram;  Surgeon: David Fair, MD;  Location: [facility] INVASIVE CV LAB;  Service: Cardiovascular;  Laterality: Left;    Outpatient  Medications Prior to Visit  Medication Sig Dispense Refill   aspirin EC 81 MG tablet Take 81 mg by mouth daily.     cilostazol (PLETAL) 100 MG tablet TAKE 1 TABLET BY MOUTH TWICE  DAILY 180 tablet 3   famotidine (PEPCID) 20 MG tablet Take 20 mg by mouth as needed.     icosapent Ethyl (VASCEPA) 1 g capsule Take 2 capsules (2 g total) by mouth 2 (two) times daily. 360 capsule 3   isosorbide mononitrate (IMDUR) 30 MG 24 hr tablet TAKE 90MG  (  3 TABS) I THE AM AND 60PM (2 TABS) IN THE PM 150 tablet 2   metoprolol tartrate (LOPRESSOR) 100 MG tablet TAKE 1 TABLET BY MOUTH TWICE  DAILY 180 tablet 3   Multiple Vitamins-Minerals (SENIOR MULTIVITAMIN PLUS PO) Take 1 tablet by mouth daily.     rosuvastatin (CRESTOR) 40 MG tablet Take 1 tablet (40 mg total) by mouth at bedtime. 90 tablet 3   lisinopril (ZESTRIL) 20 MG tablet Take 1 tablet (20 mg total) by mouth daily. 90 tablet 3   nitroGLYCERIN (NITROSTAT) 0.4 MG SL tablet Place 1 tablet (0.4 mg total) under the tongue every 5 (five) minutes as needed for chest pain. 25 tablet 3   No facility-administered medications prior to visit.     Allergies:   Azithromycin, Lactose intolerance (gi), Niacin and related, and Sulfa antibiotics   Social History   Socioeconomic History   Marital status: Married    Spouse name: Not on file   Number of children: Not on file   Years of education: Not on file   Highest education level: Not on file  Occupational History   Not on file  Tobacco Use   Smoking status: Former    Current packs/day: 0.00    Average packs/day: 0.1 packs/day for 1 year (0.1 ttl pk-yrs)    Types: Cigarettes    Start date: 08/07/1955    Quit date: 08/06/1956    Years since quitting: 66.5   Smokeless tobacco: Never  Substance and Sexual Activity   Alcohol use: No   Drug use: No   Sexual activity: Never  Other Topics Concern   Not on file  Social History Narrative   Not on file   Social Determinants of Health   Financial Resource  Strain: Not on file  Food Insecurity: Not on file  Transportation Needs: Not on file  Physical Activity: Not on file  Stress: Not on file  Social Connections: Not on file     Family History:  The patient's family history includes Diabetes in his mother; Heart attack in his father; Hyperlipidemia in his sister and sister; Hypertension in his sister and sister; Stroke in his mother.   ROS:   Please see the history of present illness.   All other systems are reviewed and are negative.   PHYSICAL EXAM:   VS:  BP 116/62 (BP Location: Left Arm, Patient Position: Sitting, Cuff Size: Normal)   Pulse 64   Ht 5' 9.5" (1.765 m)   Wt 196 lb 12.8 oz (89.3 kg)   SpO2 95%   BMI 28.65 kg/m      General: Alert, oriented x3, no distress, healthy pacemaker site. Head: no evidence of trauma, PERRL, EOMI, no exophtalmos or lid lag, no myxedema, no xanthelasma; normal ears, nose and oropharynx Neck: normal jugular venous pulsations and no hepatojugular reflux; brisk carotid pulses without delay; has bilateral carotid bruits Chest: clear to auscultation, no signs of consolidation by percussion or palpation, normal fremitus, symmetrical and full respiratory excursions Cardiovascular: normal position and quality of the apical impulse, regular rhythm, normal first and second heart sounds, no murmurs, rubs or gallops Abdomen: no tenderness or distention, no masses by palpation, no abnormal pulsatility or arterial bruits, normal bowel sounds, no hepatosplenomegaly Extremities: No lower extremity edema.  1+ R pedal pulses, 2+ on the right. Neurological: grossly nonfocal Psych: Normal mood and affect     Wt Readings from Last 3 Encounters:  02/15/23 194 lb 1.6 oz (88 kg)  02/14/23 196 lb  12.8 oz (89.3 kg)  11/27/22 194 lb 3.2 oz (88.1 kg)    Studies/Labs Reviewed:  Pharmacological nuclear stress test 01/18/2021 The left ventricular ejection fraction is moderately decreased (30-44%). Nuclear stress EF:  36%. There was no ST segment deviation noted during stress. Defect 1: There is a medium defect of severe severity present in the basal inferior, mid inferior, apical inferior, apical lateral and apex location. Findings consistent with prior myocardial infarction. This is an intermediate risk study due to reduced systolic function. There is no ischemia.   EKG:  EKG is not ordered today.  Reviewed the ECG from 07/15/2021 which shows AV sequential pacing Recent Labs: No results found for requested labs within last 365 days.   Lipid Panel  07/15/2020 Cholesterol 132, HDL 41, LDL 67, triglycerides 136, creatinine 1.38, potassium 4.5, normal liver function tests  08/09/2022 Cholesterol 142, HDL 46, LDL 66, triglycerides 178 Hemoglobin 14.1, creatinine 1.19, potassium 4.3, ALT 30, platelets 197 K     Component Value Date/Time   CHOL 119 06/24/2014 0855   TRIG 185 (H) 06/24/2014 0855   HDL 35 (L) 06/24/2014 0855   CHOLHDL 3.4 06/24/2014 0855   VLDL 37 06/24/2014 0855   LDLCALC 47 06/24/2014 0855   ASSESSMENT:    1. Coronary artery disease of native artery of native heart with stable angina pectoris (HCC)   2. Complete heart block- [facility] pacemaker '95 with Gen change '02, '09. New leads 2017.   3. Pacemaker   4. Cardiomyopathy, ischemic   5. Chronic systolic heart failure (HCC)   6. PAD   7. NSVT (nonsustained ventricular tachycardia) (HCC)   8. Essential hypertension   9. Asymptomatic stenosis of right carotid artery without infarction   10. Dyslipidemia   11. History of lymphoma          PLAN:  In order of problems listed above:  CAD (CABG 1995, PCI SVG-OM 2007 and 2014): Now having more frequent angina at lower exercise thresholds despite treatment with high doses of beta-blockers and long-acting nitrates.  Will try to add amlodipine again.  Will also get a PET scan to guide [place] to the area of ischemia that requires revascularization. CHB: No escape rhythm, pacemaker  dependent. Pacemaker: Normal device function.  Continue remote downloads every 3 months.  He has abandoned unipolar leads so he cannot have an MRI even though his current system is MRI conditional. CHF/Ischemic cardiomyopathy: Despite his depressed LVEF (45-50% by echo 02/23/2021; 36% by nuclear QGS 01/18/2021) he has no clinical manifestations of heart failure or required diuretic therapy.  He is on ACE inhibitors and beta-blockers.  Continues to appear clinically euvolemic without diuretics; functional status limited by neurogenic claudication and angina rather than shortness of breath.  Have offered transition to [facility] due to the decrease in EF on his nuclear stress test to 36% but he prefers to remain on the same medications.  In the absence of symptoms of heart failure or objective hypervolemia, doubt that he would benefit from SGLT2 inhibitors or with spironolactone. Decrease of EF is artly due to RV pacing induced dyssynchrony. PAD: Although he likely has some degree of vascular insufficiency, current complaints are more consistent with neurogenic claudication from spinal stenosis (he has pain in his legs just standing at the kitchen sink).  02/15/2021 ABI actually shows some improvement (right 0.90, left 1.11). PVCs/NSVT: Relatively low burden of PVCs now 3%, historically around 4-5%.  In the past used to have nonsustained VT but this has not been seen  in quite a while. HTN: Well-controlled.  Hopefully his blood pressure will tolerate addition of amlodipine (will decrease the lisinopril to accommodate it). Asymptomatic stenosis of the right carotid artery: Most recent carotid duplex performed 09/10/2022 showed mild progression of the stenosis in the both internal carotid arteries (60-79% on R, <39% on L) with plans to repeat in 12 months HLP: LDL within target range <70.  Borderline low HDL, which has been a chronic problem and has not improved despite reduction in weight. Lymphoma in  remission  Medication Adjustments/Labs and Tests Ordered: Current medicines are reviewed at length with the patient today.  Concerns regarding medicines are outlined above.  Medication changes, Labs and Tests ordered today are listed in the Patient Instructions below. Patient Instructions  Medication Instructions:  Start Amlodipine 2.5 mg daily Decrease Lisinopril to 10 mg daily *If you need a refill on your cardiac medications before your next appointment, please call your pharmacy*  Testing/Procedures: CARDIAC PET- Your physician has requested that you have a Cardiac Pet Stress Test. This testing is completed at [facility] ([address] [place], [place] [address]). The schedulers will call you to get this scheduled. Please follow instructions below and call the office with any questions/concerns [phone]).   How to Prepare for Your Cardiac PET/CT Stress Test:  1. Please do not take these medications before your test:   Medications that may interfere with the cardiac pharmacological stress agent (ex. nitrates - including erectile dysfunction medications, isosorbide mononitrate, tamulosin or beta-blockers) the day of the exam. (Erectile dysfunction medication should be held for at least 72 hrs prior to test) Theophylline containing medications for 12 hours. Dipyridamole 48 hours prior to the test. Your remaining medications may be taken with water.  2. Nothing to eat or drink, except water, 3 hours prior to arrival time.   NO caffeine/decaffeinated products, or chocolate 12 hours prior to arrival.  3. NO perfume, cologne or lotion  4. Total time is 1 to 2 hours; you may want to bring reading material for the waiting time.  5. Please report to Radiology at the [facility] Main Entrance 30 minutes early for your test.  [address] [place], [place] [address]  Diabetic Preparation:  Hold oral medications. You may take NPH and Lantus  insulin. Do not take Humalog or Humulin R (Regular Insulin) the day of your test. Check blood sugars prior to leaving the house. If able to eat breakfast prior to 3 hour fasting, you may take all medications, including your insulin, Do not worry if you miss your breakfast dose of insulin - start at your next meal.  IF YOU THINK YOU MAY BE PREGNANT, OR ARE NURSING PLEASE INFORM THE TECHNOLOGIST.  In preparation for your appointment, medication and supplies will be purchased.  Appointment availability is limited, so if you need to cancel or reschedule, please call the Radiology Department at [phone]  24 hours in advance to avoid a cancellation fee of $100.00  What to Expect After you Arrive:  Once you arrive and check in for your appointment, you will be taken to a preparation room within the Radiology Department.  A technologist or Nurse will obtain your medical history, verify that you are correctly prepped for the exam, and explain the procedure.  Afterwards,  an IV will be started in your arm and electrodes will be placed on your skin for EKG monitoring during the stress portion of the exam. Then you will be escorted to  the PET/CT scanner.  There, staff will get you positioned on the scanner and obtain a blood pressure and EKG.  During the exam, you will continue to be connected to the EKG and blood pressure machines.  A small, safe amount of a radioactive tracer will be injected in your IV to obtain a series of pictures of your heart along with an injection of a stress agent.    After your Exam:  It is recommended that you eat a meal and drink a caffeinated beverage to counter act any effects of the stress agent.  Drink plenty of fluids for the remainder of the day and urinate frequently for the first couple of hours after the exam.  Your doctor will inform you of your test results within 7-10 business days.  For more information and frequently asked questions, please visit our website :  [email or web address]  For questions about your test or how to prepare for your test, please call: Rockwell Alexandria, Cardiac Imaging Nurse Navigator  Larey Brick, Cardiac Imaging Nurse Navigator Office: [phone]    Follow-Up: At [facility], you and your health needs are our priority.  As part of our continuing mission to provide you with exceptional heart care, we have created designated Provider Care Teams.  These Care Teams include your primary Cardiologist (physician) and Advanced Practice Providers (APPs -  Physician Assistants and Nurse Practitioners) who all work together to provide you with the care you need, when you need it.  We recommend signing up for the patient portal called "MyChart".  Sign up information is provided on this After Visit Summary.  MyChart is used to connect with patients for Virtual Visits (Telemedicine).  Patients are able to view lab/test results, encounter notes, upcoming appointments, etc.  Non-urgent messages can be sent to your provider as well.   To learn more about what you can do with MyChart, go to [email or web address].    Your next appointment:   3 month(s)  Provider:   Thurmon Fair, MD         Signed, David Fair, MD  02/17/2023 2:56 PM    [facility] [address] [place], [place], [place]  [address] Phone: [phone]; Fax: [phone]

## 2023-02-15 ENCOUNTER — Inpatient Hospital Stay: Payer: Medicare Other | Attending: Oncology | Admitting: Oncology

## 2023-02-15 VITALS — BP 115/66 | HR 61 | Temp 98.2°F | Resp 18 | Ht 69.5 in | Wt 194.1 lb

## 2023-02-15 DIAGNOSIS — C8208 Follicular lymphoma grade I, lymph nodes of multiple sites: Secondary | ICD-10-CM | POA: Diagnosis not present

## 2023-02-15 DIAGNOSIS — I739 Peripheral vascular disease, unspecified: Secondary | ICD-10-CM | POA: Diagnosis not present

## 2023-02-15 DIAGNOSIS — Z8572 Personal history of non-Hodgkin lymphomas: Secondary | ICD-10-CM | POA: Insufficient documentation

## 2023-02-15 DIAGNOSIS — I25118 Atherosclerotic heart disease of native coronary artery with other forms of angina pectoris: Secondary | ICD-10-CM | POA: Insufficient documentation

## 2023-02-15 DIAGNOSIS — Z9221 Personal history of antineoplastic chemotherapy: Secondary | ICD-10-CM | POA: Insufficient documentation

## 2023-02-15 NOTE — Progress Notes (Signed)
  [facility] OFFICE PROGRESS NOTE   Diagnosis: Non-Hodgkin's lymphoma  INTERVAL HISTORY:   David [name] returns as scheduled.  No fever or night sweats.  No palpable lymph nodes.  Good appetite.  He has exertional angina relieved with rest and nitroglycerin.  He has numbness in the feet with ambulation. He has developed a "cyst "at the right side of the anus.  The cyst drained when he performed a sitz bath. Objective:  Vital signs in last 24 hours:  Blood pressure 115/66, pulse 61, temperature 98.2 F (36.8 C), temperature source Tympanic, resp. rate 18, height 5' 9.5" (1.765 m), weight 194 lb 1.6 oz (88 kg), SpO2 99%.    HEENT: Neck without mass, no mass at the left preauricular region.  Oral cavity without visible mass Lymphatics: No cervical, supraclavicular, axillary, or inguinal nodes Resp: Lungs clear bilaterally Cardio: Regular rate and rhythm GI: No hepatosplenomegaly Vascular: No leg edema Skin: 2 cm subcutaneous soft nodular lesion at the right perineum.  I was able to express a small amount of thick sanguinous drainage.   Lab Results:  Lab Results  Component Value Date   WBC 6.9 07/15/2021   HGB 13.5 07/15/2021   HCT 41.5 07/15/2021   MCV 95.4 07/15/2021   PLT 197 07/15/2021   NEUTROABS 5.0 07/15/2021    CMP  Lab Results  Component Value Date   NA 135 07/15/2021   K 4.6 07/15/2021   CL 106 07/15/2021   CO2 22 07/15/2021   GLUCOSE 106 (H) 07/15/2021   BUN 18 07/15/2021   CREATININE 1.22 07/15/2021   CALCIUM 8.8 (L) 07/15/2021   PROT 6.9 07/15/2021   ALBUMIN 4.1 07/15/2021   AST 30 07/15/2021   ALT 34 07/15/2021   ALKPHOS 46 07/15/2021   BILITOT 0.9 07/15/2021   GFRNONAA 58 (L) 07/15/2021   GFRAA >60 05/07/2016    Medications: I have reviewed the patient's current medications.   Assessment/Plan: Non-Hodgkin's lymphoma, follicular B-cell, low grade, involving a left cervical lymph node with a staging evaluation revealing evidence for  advanced stage disease. There was cervical, mediastinal, and abdominal lymphadenopathy and a question of splenic involvement on staging scans. He completed 4 cycles of fludarabine/rituximab with resolution of the palpable lymphadenopathy. The last chemotherapy was given in June 2011.   Skin rash following cycle #1 of fludarabine/rituximab, likely related to allopurinol or Bactrim. The Bactrim prophylaxis was discontinued. History of Chronic Thrombocytopenia secondary chemotherapy and non-Hodgkin's lymphoma-normal today History of neutropenia secondary to chemotherapy. Injection site reaction to a pneumococcal vaccine in October 2011. History of coronary artery disease and peripheral vascular disease, followed by cardiology papillary urothelial carcinoma, status post transurethral resection and intravesical mitomycin-C 04/13/2016 Spinal stenosis      Disposition: David [name] is in clinical remission from non-Hodgkin's lymphoma.  He would like to continue follow-up at the Cancer center.  He will return for an office visit in 1 year.  He will continue follow-up with cardiology and vascular surgery for management of CAD and peripheral vascular disease.  The lesion at the right perineum appears to be a sebaceous cyst.  He will follow-up with Dr. Clelia Croft if the lesion persists.  Will continue sitz bath's.  The lesion may require an I&D procedure.  Thornton Papas, MD  02/15/2023  10:10 AM

## 2023-02-26 ENCOUNTER — Telehealth (HOSPITAL_COMMUNITY): Payer: Self-pay | Admitting: Emergency Medicine

## 2023-02-26 ENCOUNTER — Encounter (HOSPITAL_COMMUNITY): Payer: Self-pay

## 2023-02-26 NOTE — Telephone Encounter (Signed)
Reaching out to patient to offer assistance regarding upcoming cardiac imaging study; pt verbalizes understanding of appt date/time, parking situation and where to check in, pre-test NPO status and medications ordered, and verified current allergies; name and call back number provided for further questions should they arise Sara Wallace RN Navigator Cardiac Imaging [facility] [phone] office [phone] cell 

## 2023-02-27 ENCOUNTER — Ambulatory Visit (HOSPITAL_COMMUNITY)
Admission: RE | Admit: 2023-02-27 | Discharge: 2023-02-27 | Disposition: A | Discharge status: Home / Self Care | Payer: Medicare Other | Source: Ambulatory Visit | Attending: Nurse Practitioner | Admitting: Nurse Practitioner

## 2023-02-27 DIAGNOSIS — I2089 Other forms of angina pectoris: Secondary | ICD-10-CM | POA: Diagnosis not present

## 2023-02-27 MED ORDER — RUBIDIUM RB82 GENERATOR (RUBYFILL)
22.7300 | PACK | Freq: Once | INTRAVENOUS | Status: AC
  Administered 2023-02-27: 22.73 via INTRAVENOUS

## 2023-02-27 MED ORDER — RUBIDIUM RB82 GENERATOR (RUBYFILL)
22.7500 | PACK | Freq: Once | INTRAVENOUS | Status: AC
  Administered 2023-02-27: 22.75 via INTRAVENOUS

## 2023-02-27 MED ORDER — REGADENOSON 0.4 MG/5ML IV SOLN
INTRAVENOUS | Status: AC
  Filled 2023-02-27: qty 5

## 2023-02-27 MED ORDER — REGADENOSON 0.4 MG/5ML IV SOLN
0.4000 mg | Freq: Once | INTRAVENOUS | Status: AC
  Administered 2023-02-27: 0.4 mg via INTRAVENOUS

## 2023-02-28 ENCOUNTER — Telehealth: Payer: Self-pay | Admitting: Emergency Medicine

## 2023-02-28 ENCOUNTER — Other Ambulatory Visit: Payer: Self-pay

## 2023-02-28 DIAGNOSIS — Z01812 Encounter for preprocedural laboratory examination: Secondary | ICD-10-CM

## 2023-02-28 DIAGNOSIS — I25118 Atherosclerotic heart disease of native coronary artery with other forms of angina pectoris: Secondary | ICD-10-CM | POA: Diagnosis not present

## 2023-02-28 LAB — NM PET CT CARDIAC PERFUSION MULTI W/ABSOLUTE BLOODFLOW
Nuc Rest EF: 47 %
TID: 1.47

## 2023-02-28 NOTE — Telephone Encounter (Signed)
Croitoru, Rachelle Hora, MD  Scheryl Marten, RN The PET scan is highly abnormal and I think he needs to undergo cardiac catheterization.  I talked to him on the phone and he understands.  Will schedule for next Tuesday.  Needs to get precath labs today or tomorrow please  Called the patient and went over all the instructions (also sent in MyChart- patient has reviewed) He will come today to get labs drawn. He verbalized understanding of all instructions.   (Dr C was looking at a different day, Dr [name] will be performing cath)  Your physician has requested that you have a cardiac catheterization. Cardiac catheterization is used to diagnose and/or treat various heart conditions. Doctors may recommend this procedure for a number of different reasons. The most common reason is to evaluate chest pain. Chest pain can be a symptom of coronary artery disease (CAD), and cardiac catheterization can show whether plaque is narrowing or blocking your heart's arteries. This procedure is also used to evaluate the valves, as well as measure the blood flow and oxygen levels in different parts of your heart. For further information please visit [email or web address] Please follow instruction sheet, as given.    [facility] A DEPT OF [facility] AT [facility] [address] [place] [address] Dept: [phone] Loc: [phone]  SURESH AUDI  02/28/2023  You are scheduled for a Cardiac Catheterization on Tuesday, July 30 with Dr. Lance Muss.  1. Please arrive at the [facility] (Main Entrance A) at [facility]: [address] [place], [place] [address] at 0700 (This time is 2 hour(s) before your procedure to ensure your preparation). Free valet parking service is available. You will check in at ADMITTING. The support person will be asked to wait in the waiting room.  It is OK to have someone drop you off and come back when  you are ready to be discharged.    Special note: Every effort is made to have your procedure done on time. Please understand that emergencies sometimes delay scheduled procedures.  2. Diet: Do not eat solid foods after midnight.  The patient may have clear liquids until 5am upon the day of the procedure.  3. Labs: You will need to have blood drawn on Friday, July 26 at [facility] [address], [place]  Open: 8am - 5pm (Lunch 12:30 - 1:30)   Phone: [phone]. You do not need to be fasting.  4. Medication instructions in preparation for your procedure:   Contrast Allergy: No   On the morning of your procedure, take your Aspirin 81 mg and any morning medicines NOT listed above.  You may use sips of water.  5. Plan to go home the same day, you will only stay overnight if medically necessary. 6. Bring a current list of your medications and current insurance cards. 7. You MUST have a responsible person to drive you home. 8. Someone MUST be with you the first 24 hours after you arrive home or your discharge will be delayed. 9. Please wear clothes that are easy to get on and off and wear slip-on shoes.  Thank you for allowing [place] to care for you!   -- [facility]

## 2023-02-28 NOTE — Progress Notes (Signed)
Thanks for the heads up.  Looks like issues with multiple grafts maybe?

## 2023-02-28 NOTE — H&P (View-Only) (Signed)
Yes. Pretty sure the SVG-OM is gone, probably the SVG-RCA as well, but worried about the native LAD (especially the retrograde portion upstream from LIMA?). May not have any easy PCI option, but he is now very limited by symptoms.

## 2023-02-28 NOTE — Progress Notes (Signed)
Yes. Pretty sure the SVG-OM is gone, probably the SVG-RCA as well, but worried about the native LAD (especially the retrograde portion upstream from LIMA?). May not have any easy PCI option, but he is now very limited by symptoms.

## 2023-03-04 ENCOUNTER — Telehealth: Payer: Self-pay | Admitting: *Deleted

## 2023-03-04 NOTE — Telephone Encounter (Signed)
Cardiac Catheterization scheduled at [facility] for: Tuesday March 05, 2023 9 AM Arrival time [facility] Main Entrance A at: 7 AM  Nothing to eat after midnight prior to procedure, clear liquids until 5 AM day of procedure.  Medication instructions: -Hold:  Lisinopril-day before and day of procedure-per protocol-GFR 59-pt already taken today -Other usual morning medications can be taken with sips of water including aspirin 81 mg.  Confirmed patient has responsible adult to drive home post procedure and be with patient first 24 hours after arriving home.  Plan to go home the same day, you will only stay overnight if medically necessary.  Reviewed procedure instructions with patient.

## 2023-03-05 ENCOUNTER — Telehealth: Payer: Self-pay | Admitting: Physician Assistant

## 2023-03-05 ENCOUNTER — Other Ambulatory Visit: Payer: Self-pay

## 2023-03-05 ENCOUNTER — Ambulatory Visit (HOSPITAL_COMMUNITY)
Admission: RE | Disposition: A | Discharge status: Home / Self Care | Payer: Self-pay | Source: Home / Self Care | Attending: Interventional Cardiology

## 2023-03-05 ENCOUNTER — Ambulatory Visit (HOSPITAL_COMMUNITY)
Admission: RE | Admit: 2023-03-05 | Discharge: 2023-03-05 | Disposition: A | Discharge status: Home / Self Care | Payer: Medicare Other | Attending: Interventional Cardiology | Admitting: Interventional Cardiology

## 2023-03-05 ENCOUNTER — Other Ambulatory Visit (HOSPITAL_COMMUNITY): Payer: Self-pay

## 2023-03-05 DIAGNOSIS — E785 Hyperlipidemia, unspecified: Secondary | ICD-10-CM | POA: Diagnosis not present

## 2023-03-05 DIAGNOSIS — I25118 Atherosclerotic heart disease of native coronary artery with other forms of angina pectoris: Secondary | ICD-10-CM | POA: Diagnosis not present

## 2023-03-05 DIAGNOSIS — I2584 Coronary atherosclerosis due to calcified coronary lesion: Secondary | ICD-10-CM | POA: Insufficient documentation

## 2023-03-05 DIAGNOSIS — Z9861 Coronary angioplasty status: Secondary | ICD-10-CM

## 2023-03-05 DIAGNOSIS — I25718 Atherosclerosis of autologous vein coronary artery bypass graft(s) with other forms of angina pectoris: Secondary | ICD-10-CM

## 2023-03-05 DIAGNOSIS — I442 Atrioventricular block, complete: Secondary | ICD-10-CM | POA: Insufficient documentation

## 2023-03-05 DIAGNOSIS — I2582 Chronic total occlusion of coronary artery: Secondary | ICD-10-CM | POA: Diagnosis not present

## 2023-03-05 DIAGNOSIS — Z955 Presence of coronary angioplasty implant and graft: Secondary | ICD-10-CM | POA: Diagnosis not present

## 2023-03-05 DIAGNOSIS — I25810 Atherosclerosis of coronary artery bypass graft(s) without angina pectoris: Secondary | ICD-10-CM | POA: Diagnosis present

## 2023-03-05 DIAGNOSIS — I10 Essential (primary) hypertension: Secondary | ICD-10-CM | POA: Diagnosis not present

## 2023-03-05 DIAGNOSIS — Z950 Presence of cardiac pacemaker: Secondary | ICD-10-CM | POA: Insufficient documentation

## 2023-03-05 DIAGNOSIS — Z951 Presence of aortocoronary bypass graft: Secondary | ICD-10-CM | POA: Diagnosis not present

## 2023-03-05 DIAGNOSIS — I255 Ischemic cardiomyopathy: Secondary | ICD-10-CM | POA: Diagnosis not present

## 2023-03-05 DIAGNOSIS — I209 Angina pectoris, unspecified: Secondary | ICD-10-CM | POA: Diagnosis present

## 2023-03-05 HISTORY — PX: LEFT HEART CATH AND CORS/GRAFTS ANGIOGRAPHY: CATH118250

## 2023-03-05 HISTORY — PX: CORONARY BALLOON ANGIOPLASTY: CATH118233

## 2023-03-05 LAB — POCT ACTIVATED CLOTTING TIME: Activated Clotting Time: 299 seconds

## 2023-03-05 SURGERY — LEFT HEART CATH AND CORS/GRAFTS ANGIOGRAPHY
Anesthesia: LOCAL

## 2023-03-05 MED ORDER — VERAPAMIL HCL 2.5 MG/ML IV SOLN
INTRAVENOUS | Status: AC
  Filled 2023-03-05: qty 2

## 2023-03-05 MED ORDER — ISOSORBIDE MONONITRATE ER 60 MG PO TB24: 60.0000 mg | ORAL_TABLET | Freq: Two times a day (BID) | ORAL | 1 refills | Status: DC

## 2023-03-05 MED ORDER — ISOSORBIDE MONONITRATE ER 60 MG PO TB24: 60.0000 mg | ORAL_TABLET | Freq: Two times a day (BID) | ORAL | Status: DC

## 2023-03-05 MED ORDER — NITROGLYCERIN 1 MG/10 ML FOR IR/CATH LAB
INTRA_ARTERIAL | Status: AC
  Filled 2023-03-05: qty 10

## 2023-03-05 MED ORDER — FENTANYL CITRATE (PF) 100 MCG/2ML IJ SOLN
INTRAMUSCULAR | Status: DC | PRN
  Administered 2023-03-05 (×2): 25 ug via INTRAVENOUS

## 2023-03-05 MED ORDER — CLOPIDOGREL BISULFATE 300 MG PO TABS
ORAL_TABLET | ORAL | Status: AC
  Filled 2023-03-05: qty 2

## 2023-03-05 MED ORDER — ACETAMINOPHEN 325 MG PO TABS: 650.0000 mg | ORAL_TABLET | ORAL | Status: DC | PRN

## 2023-03-05 MED ORDER — SODIUM CHLORIDE 0.9 % WEIGHT BASED INFUSION: 1.0000 mL/kg/h | INTRAVENOUS | Status: DC

## 2023-03-05 MED ORDER — ASPIRIN 81 MG PO CHEW: 81.0000 mg | CHEWABLE_TABLET | ORAL | Status: DC

## 2023-03-05 MED ORDER — HEPARIN (PORCINE) IN NACL 1000-0.9 UT/500ML-% IV SOLN
INTRAVENOUS | Status: DC | PRN
  Administered 2023-03-05 (×4): 500 mL

## 2023-03-05 MED ORDER — SODIUM CHLORIDE 0.9% FLUSH: 3.0000 mL | Freq: Two times a day (BID) | INTRAVENOUS | Status: DC

## 2023-03-05 MED ORDER — HEPARIN SODIUM (PORCINE) 1000 UNIT/ML IJ SOLN
INTRAMUSCULAR | Status: AC
  Filled 2023-03-05: qty 10

## 2023-03-05 MED ORDER — SODIUM CHLORIDE 0.9% FLUSH: 3.0000 mL | INTRAVENOUS | Status: DC | PRN

## 2023-03-05 MED ORDER — LABETALOL HCL 5 MG/ML IV SOLN: 10.0000 mg | INTRAVENOUS | Status: AC | PRN

## 2023-03-05 MED ORDER — MIDAZOLAM HCL 2 MG/2ML IJ SOLN
INTRAMUSCULAR | Status: DC | PRN
  Administered 2023-03-05 (×2): 1 mg via INTRAVENOUS

## 2023-03-05 MED ORDER — ASPIRIN 81 MG PO CHEW: 81.0000 mg | CHEWABLE_TABLET | Freq: Every day | ORAL | Status: DC

## 2023-03-05 MED ORDER — SODIUM CHLORIDE 0.9 % IV SOLN: 250.0000 mL | INTRAVENOUS | Status: DC | PRN

## 2023-03-05 MED ORDER — HYDRALAZINE HCL 20 MG/ML IJ SOLN: 10.0000 mg | INTRAMUSCULAR | Status: AC | PRN

## 2023-03-05 MED ORDER — IOHEXOL 350 MG/ML SOLN
INTRAVENOUS | Status: DC | PRN
  Administered 2023-03-05: 132 mL

## 2023-03-05 MED ORDER — FAMOTIDINE IN NACL 20-0.9 MG/50ML-% IV SOLN
INTRAVENOUS | Status: DC | PRN
  Administered 2023-03-05: 20 mg via INTRAVENOUS

## 2023-03-05 MED ORDER — FAMOTIDINE IN NACL 20-0.9 MG/50ML-% IV SOLN
INTRAVENOUS | Status: AC
  Filled 2023-03-05: qty 50

## 2023-03-05 MED ORDER — SODIUM CHLORIDE 0.9 % WEIGHT BASED INFUSION
3.0000 mL/kg/h | INTRAVENOUS | Status: AC
  Administered 2023-03-05: 3 mL/kg/h via INTRAVENOUS

## 2023-03-05 MED ORDER — CLOPIDOGREL BISULFATE 75 MG PO TABS: 75.0000 mg | ORAL_TABLET | Freq: Every day | ORAL | Status: DC

## 2023-03-05 MED ORDER — LIDOCAINE HCL (PF) 1 % IJ SOLN
INTRAMUSCULAR | Status: AC
  Filled 2023-03-05: qty 30

## 2023-03-05 MED ORDER — CLOPIDOGREL BISULFATE 75 MG PO TABS
75.0000 mg | ORAL_TABLET | Freq: Every day | ORAL | 5 refills | Status: DC
  Filled 2023-03-05: qty 30, 30d supply, fill #0

## 2023-03-05 MED ORDER — VERAPAMIL HCL 2.5 MG/ML IV SOLN
INTRAVENOUS | Status: DC | PRN
  Administered 2023-03-05: 10 mL via INTRA_ARTERIAL

## 2023-03-05 MED ORDER — ONDANSETRON HCL 4 MG/2ML IJ SOLN: 4.0000 mg | Freq: Four times a day (QID) | INTRAMUSCULAR | Status: DC | PRN

## 2023-03-05 MED ORDER — MIDAZOLAM HCL 2 MG/2ML IJ SOLN
INTRAMUSCULAR | Status: AC
  Filled 2023-03-05: qty 2

## 2023-03-05 MED ORDER — HEPARIN SODIUM (PORCINE) 1000 UNIT/ML IJ SOLN
INTRAMUSCULAR | Status: DC | PRN
  Administered 2023-03-05: 5000 [IU] via INTRAVENOUS
  Administered 2023-03-05: 4500 [IU] via INTRAVENOUS

## 2023-03-05 MED ORDER — CLOPIDOGREL BISULFATE 300 MG PO TABS
ORAL_TABLET | ORAL | Status: DC | PRN
  Administered 2023-03-05: 600 mg via ORAL

## 2023-03-05 MED ORDER — FENTANYL CITRATE (PF) 100 MCG/2ML IJ SOLN
INTRAMUSCULAR | Status: AC
  Filled 2023-03-05: qty 2

## 2023-03-05 MED ORDER — LIDOCAINE HCL (PF) 1 % IJ SOLN
INTRAMUSCULAR | Status: DC | PRN
  Administered 2023-03-05: 2 mL

## 2023-03-05 MED ORDER — ISOSORBIDE MONONITRATE ER 30 MG PO TB24: 60.0000 mg | ORAL_TABLET | Freq: Two times a day (BID) | ORAL | 1 refills | Status: DC

## 2023-03-05 SURGICAL SUPPLY — 29 items
BALL SAPPHIRE NC24 3.5X12 (BALLOONS) ×1
BALL SAPPHIRE NC24 3.75X10 (BALLOONS) ×1
BALLN SAPPHIRE 2.5X12 (BALLOONS) ×1
BALLN SCOREFLEX 3.0X10 (BALLOONS) ×1
BALLN WOLVERINE 3.50X10 (BALLOONS) ×1
BALLOON SAPPHIRE 2.5X12 (BALLOONS) IMPLANT
BALLOON SAPPHIRE NC24 3.5X12 (BALLOONS) IMPLANT
BALLOON SAPPHIRE NC24 3.75X10 (BALLOONS) IMPLANT
BALLOON SCOREFLEX 3.0X10 (BALLOONS) IMPLANT
BALLOON WOLVERINE 3.50X10 (BALLOONS) IMPLANT
CATH EXPO 5F MPA-1 (CATHETERS) IMPLANT
CATH INFINITI 5 FR IM (CATHETERS) IMPLANT
CATH INFINITI 5FR AL1 (CATHETERS) IMPLANT
CATH INFINITI 5FR JL4 (CATHETERS) IMPLANT
CATH INFINITI JR4 5F (CATHETERS) IMPLANT
CATH LAUNCHER 6FR AL2 (CATHETERS) IMPLANT
CATHETER LAUNCHER 6FR AL2 (CATHETERS) ×1
DEVICE RAD COMP TR BAND LRG (VASCULAR PRODUCTS) IMPLANT
ELECT DEFIB PAD ADLT CADENCE (PAD) IMPLANT
GLIDESHEATH SLEND SS 6F .021 (SHEATH) IMPLANT
GUIDEWIRE INQWIRE 1.5J.035X260 (WIRE) IMPLANT
INQWIRE 1.5J .035X260CM (WIRE) ×1
KIT ENCORE 26 ADVANTAGE (KITS) IMPLANT
KIT HEMO VALVE WATCHDOG (MISCELLANEOUS) IMPLANT
PACK CARDIAC CATHETERIZATION (CUSTOM PROCEDURE TRAY) ×1 IMPLANT
PROTECTION STATION PRESSURIZED (MISCELLANEOUS) ×1
SET ATX-X65L (MISCELLANEOUS) IMPLANT
STATION PROTECTION PRESSURIZED (MISCELLANEOUS) IMPLANT
WIRE RUNTHROUGH .014X180CM (WIRE) IMPLANT

## 2023-03-05 NOTE — Progress Notes (Addendum)
Veronica from cath lab repositioned TR band and put 8 cc in the band. Left radial site level 0 clean, dry and intact.

## 2023-03-05 NOTE — Progress Notes (Signed)
Pt was educated on PTCA,Antiplatelet and ASA use, wt restrictions, no baths/daily wash-ups, s/s of infection, ex guidelines, s/s to stop exercising, NTG use and calling 911, heart healthy diet, risk factors and CRPII. Pt received materials on exercise, diet, and CRPII. Will refer to [facility].

## 2023-03-05 NOTE — Interval H&P Note (Signed)
Cath Lab Visit (complete for each Cath Lab visit)  Clinical Evaluation Leading to the Procedure:   ACS: No.  Non-ACS:    Anginal Classification: CCS III  Anti-ischemic medical therapy: Minimal Therapy (1 class of medications)  Non-Invasive Test Results: High-risk stress test findings: cardiac mortality >3%/year  Prior CABG: Previous CABG      History and Physical Interval Note:  03/05/2023 11:11 AM  David [name]  has presented today for surgery, with the diagnosis of cad - unstaable angina.  The various methods of treatment have been discussed with the patient and family. After consideration of risks, benefits and other options for treatment, the patient has consented to  Procedure(s): LEFT HEART CATH AND CORS/GRAFTS ANGIOGRAPHY (N/A) as a surgical intervention.  The patient's history has been reviewed, patient examined, no change in status, stable for surgery.  I have reviewed the patient's chart and labs.  Questions were answered to the patient's satisfaction.     Lance Muss

## 2023-03-05 NOTE — Telephone Encounter (Signed)
   Transition of Care Follow-up Phone Call Request    Patient Name: DUNG COULBOURN Date of Birth: May 02, 1933 Date of Encounter: 03/05/2023  Primary Care Provider:  Lupita Raider, MD Primary Cardiologist:  Thurmon Fair, MD  Bernita Raisin has been scheduled for a transition of care follow up appointment with a [facility] provider:   Thursday Mar 14, 2023 8:50 AM Carlos Levering NP  Please reach out to Bernita Raisin within 48 hours to confirm appointment and review transition of care protocol questionnaire.  Laurann Montana, PA-C  03/05/2023, 1:29 PM

## 2023-03-05 NOTE — Telephone Encounter (Signed)
Patient currently admitted. Will need call once discharge

## 2023-03-05 NOTE — Discharge Summary (Cosign Needed Addendum)
Discharge Summary for Same [name] PCI   Patient ID: David [name] MRN: [id number]; DOB: 10-21-1932  Admit date: 03/05/2023 Discharge date: 03/05/2023  Primary Care Provider: Lupita Raider, MD  Primary Cardiologist: Thurmon Fair, MD  Primary Electrophysiologist:  None   Discharge Diagnoses    Principal Problem:   Angina pectoris [name]) Active Problems:   CAD s/p CABG 1995, s/p stent SVG-OM 2007 and July 2014   Dyslipidemia   HTN (hypertension)   Ischemic cardiomyopathy    Diagnostic Studies/Procedures    Cardiac Catheterization 03/05/2023:     Ost LAD to Prox LAD lesion is 100% stenosed.  LIMA to LAD patent. Distal LAD is small.   1st Mrg lesion is 100% stenosed.   Mid Cx to Dist Cx lesion is 100% stenosed.   Mid RCA lesion is 100% stenosed. SVG to RCA is occlusded.   Prox Graft lesion is 100% stenosed.   Prox Graft lesion before 1st Mrg  is 20% stenosed.   OM graft in stent-Mid Graft lesion before 1st Mrg  is 90% stenosed.  Balloon angioplasty was performed using a BALL SAPPHIRE NC24 3.75X10.  See report for details of all of the balloons used.  Cutting and scoring balloons used.   Post intervention, there is a 40% residual stenosis.   LV end diastolic pressure is moderately elevated.   There is no aortic valve stenosis.   In the absence of any other complications or medical issues, we expect the patient to be ready for discharge from an interventional cardiology perspective on 03/05/2023.   Recommend uninterrupted dual antiplatelet therapy with Aspirin 81mg  daily and Clopidogrel 75mg  daily for a minimum of 6 months (stable ischemic heart disease-Class I recommendation).   Successful PTCA of calcified lesion in the prior stent from 2014 in the SVG to OM jump graft.  Unable to fully expand the lesion.  If he were to have restenosis, could consider shockwave.  Of note, the patient had no chest discomfort during prolonged balloon inflations.  He had chest discomfort earlier in  the procedure prior to any intervention.  Given the fact that he had no symptoms with the SVG to OM occluded by balloon, I elected to end the procedure since we had improved flow significantly.   Plan for same-[name] discharge later today.  He has been started on clopidogrel in the Cath Lab.     Cardiac PET prior to admission 02/27/23    Findings are consistent with ischemia. The study is very high risk due to the presence of extensive ischemia, significant TID (1.4), and drop in EF with stress . Recommend expedited coronary angiography if clinically indicated.   LV perfusion is abnormal. There is evidence of ischemia. There is a large defect with severe reduction in uptake present in the apical to basal anterior, anterolateral, inferior, inferolateral and inferoseptal location(s) that is mostly reversible. There is abnormal wall motion in the defect area. Consistent with extensive ischemia with mild region of infarction. The defect is consistent with multivessel ischemia. Defect 2: There is a small defect with severe reduction in uptake present in the apex location(s) that is fixed. There is abnormal wall motion in the defect area. Consistent with infarction.   Rest left ventricular function is abnormal. Rest global function is mildly reduced. Rest EF: 47%. Stress left ventricular function is abnormal. Stress global function is moderately reduced. Stress EF: 36%. LVEF dropped with stress which is concerning for multivessel ischemia as detailed above. End diastolic cavity size is normal. End  systolic cavity size is normal.   Myocardial blood flow reserve is not reported in this patient due to presence of prior CABG that affect accuracy.   Coronary calcium assessment not performed due to prior revascularization.   Findings communicated with David [name]   Electronically signed: [name], MD _____________   History of Present Illness     David [name] is a 89 y.o. male with complete heart block  s/p BosSci PPM, coronary artery disease s/p remote CABG 1995, PCI SVG, PAD of the lower extremities (has occluded popliteal stents in the left lower extremity), ischemic cardiomyopathy +/- contribution of RV pacing induced dyssynchrony, symptomatic PVCs (and one episode of lengthy nonsustained VT detected by his pacemaker), suspected neurogenic claudication, carotid artery disease, NHL, bladder tumor, HTN, HLD presented to [name] for cardiac cath. He was recently seen in the office by David [name] 02/14/23 with increasing angina at lower exercise thresholds despite treatment with high dose beta blockers and long acting nitrates requiring re-addition of low dose amlodipine. Cardiac PET scan was done to help localize area of ischemia with results outlined above. Cardiac catheterization was arranged for further evaluation.  Hospital Course     The patient underwent cardiac cath as noted above with subsequent PTCA of calcified lesion in prior stent from 2014 in the SVG-OM jump graft. Per cath note, "If he were to have restenosis, could consider shockwave. Of note, the patient had no chest discomfort during prolonged balloon inflations. He had chest discomfort earlier in the procedure prior to any intervention. Given the fact that he had no symptoms with the SVG to OM occluded by balloon, I elected to end the procedure since we had improved flow significantly." Dr. [name] recommended uninterrupted dual antiplatelet therapy with Aspirin 81mg  daily and Clopidogrel 75mg  daily for a minimum of 6 months (stable ischemic heart disease-Class I recommendation).. The patient was seen by cardiac rehab while in short stay. There were no observed complications post cath. Radial cath site will be re-evaluated prior to discharge by on call APP. I signed out to [name], NP. Instructions/precautions regarding cath site care were given prior to discharge.  David [name] was seen by Dr. [name] and determined stable for discharge home.  Follow up with our office has been arranged. Medications are listed below. Pertinent changes include discontinuation of Pletal (given addition of Plavix to aspirin) and decrease in Imdur from 90mg  QAM/60mg  QPM to 60mg  BID per Dr. Hoyle Barr recommendation. Plavix rx was sent into [facility] pharmacy to be delivered to bedside prior to DC. Per discussion with the patient, he already has in-date Imdur in the form of both 30mg  and 60mg  tablets at home that he plans to use up to accomplish the 60mg  twice daily dosing. He verbalized understanding how to do so. Per our discussion we sent in the updated rx of 60mg  BID to [facility] for him to fill when he is ready. (I notified [facility] to cancel previously sent rx.)    _____________  Cath/PCI Registry Performance & Quality Measures: Aspirin prescribed? - Yes ADP Receptor Inhibitor (Plavix/Clopidogrel, Brilinta/Ticagrelor or Effient/Prasugrel) prescribed (includes medically managed patients)? - Yes High Intensity Statin (Lipitor 40-80mg  or Crestor 20-40mg ) prescribed? - Yes For EF <40%, was ACEI/ARB prescribed? - Not Applicable (EF >/= 40%) For EF <40%, Aldosterone Antagonist (Spironolactone or Eplerenone) prescribed? - Not Applicable (EF >/= 40%) Cardiac Rehab Phase II ordered (Included Medically managed Patients)? - Yes  _____________   Discharge Vitals Blood pressure (!) 113/55, pulse 63, temperature 98.3  F (36.8 C), temperature source Oral, resp. rate 17, height 5\' 9"  (1.753 m), weight 86.2 kg, SpO2 95%.  Filed Weights   03/05/23 0725  Weight: 86.2 kg    Last Labs & Radiologic Studies   _____________  CARDIAC CATHETERIZATION  Addendum Date:      Ost LAD to Prox LAD lesion is 100% stenosed.  LIMA to LAD patent. Distal LAD is small.   1st Mrg lesion is 100% stenosed.   Mid Cx to Dist Cx lesion is 100% stenosed.   Mid RCA lesion is 100% stenosed. SVG to RCA is occlusded.   Prox Graft lesion is 100% stenosed.   Prox Graft lesion before 1st Mrg  is 20%  stenosed.   OM graft in stent-Mid Graft lesion before 1st Mrg  is 90% stenosed.  Balloon angioplasty was performed using a BALL SAPPHIRE NC24 3.75X10.  See report for details of all of the balloons used.  Cutting and scoring balloons used.   Post intervention, there is a 40% residual stenosis.   LV end diastolic pressure is moderately elevated.   There is no aortic valve stenosis.   In the absence of any other complications or medical issues, we expect the patient to be ready for discharge from an interventional cardiology perspective on 03/05/2023.   Recommend uninterrupted dual antiplatelet therapy with Aspirin 81mg  daily and Clopidogrel 75mg  daily for a minimum of 6 months (stable ischemic heart disease-Class I recommendation). Successful PTCA of calcified lesion in the prior stent from 2014 in the SVG to OM jump graft.  Unable to fully expand the lesion.  If he were to have restenosis, could consider shockwave.  Of note, the patient had no chest discomfort during prolonged balloon inflations.  He had chest discomfort earlier in the procedure prior to any intervention.  Given the fact that he had no symptoms with the SVG to OM occluded by balloon, I elected to end the procedure since we had improved flow significantly. Plan for same-[name] discharge later today.  He has been started on clopidogrel in the Cath Lab.  Result Date: 03/05/2023   Ost LAD to Prox LAD lesion is 100% stenosed.  LIMA to LAD patent. Distal LAD is small.   1st Mrg lesion is 100% stenosed.   Mid Cx to Dist Cx lesion is 100% stenosed.   Mid RCA lesion is 100% stenosed. SVG to RCA is occlusded.   Prox Graft lesion is 100% stenosed.   Prox Graft lesion before 1st Mrg  is 20% stenosed.   OM graft in stent-Mid Graft lesion before 1st Mrg  is 90% stenosed.  Balloon angioplasty was performed using a BALL SAPPHIRE NC24 3.75X10.  See report for details of all of the balloons used.  Cutting and scoring balloons used.   Post intervention, there is a 40%  residual stenosis.   LV end diastolic pressure is moderately elevated.   There is no aortic valve stenosis.   In the absence of any other complications or medical issues, we expect the patient to be ready for discharge from an interventional cardiology perspective on 03/05/2023.   Recommend uninterrupted dual antiplatelet therapy with Aspirin 81mg  daily and Clopidogrel 75mg  daily for a minimum of 6 months (stable ischemic heart disease-Class I recommendation). Successful PTCA of calcified lesion in the prior stent from 2014 in the SVG to OM jump graft.  Unable to fully expand the lesion.  If he were to have restenosis, could consider shockwave.  Of note, the patient had no chest  discomfort during prolonged balloon inflations.  He had chest discomfort earlier in the procedure prior to any intervention.  Given the fact that he had no symptoms with the SVG to OM occluded by balloon, I elected to end the procedure since we had improved flow significantly. Plan for same-[name] discharge later today.  He has been started on clopidogrel in the Cath Lab.   NM PET CT CARDIAC PERFUSION MULTI W/ABSOLUTE BLOODFLOW  Result Date: 02/28/2023   Findings are consistent with ischemia. The study is very high risk due to the presence of extensive ischemia, significant TID (1.4), and drop in EF with stress . Recommend expedited coronary angiography if clinically indicated.   LV perfusion is abnormal. There is evidence of ischemia. There is a large defect with severe reduction in uptake present in the apical to basal anterior, anterolateral, inferior, inferolateral and inferoseptal location(s) that is mostly reversible. There is abnormal wall motion in the defect area. Consistent with extensive ischemia with mild region of infarction. The defect is consistent with multivessel ischemia. Defect 2: There is a small defect with severe reduction in uptake present in the apex location(s) that is fixed. There is abnormal wall motion in the  defect area. Consistent with infarction.   Rest left ventricular function is abnormal. Rest global function is mildly reduced. Rest EF: 47%. Stress left ventricular function is abnormal. Stress global function is moderately reduced. Stress EF: 36%. LVEF dropped with stress which is concerning for multivessel ischemia as detailed above. End diastolic cavity size is normal. End systolic cavity size is normal.   Myocardial blood flow reserve is not reported in this patient due to presence of prior CABG that affect accuracy.   Coronary calcium assessment not performed due to prior revascularization.   Findings communicated with David [name]   Electronically signed: [name], MD EXAM: OVER-READ INTERPRETATION  CT CHEST The following report is a limited chest CT over-read performed by radiologist Dr. Irma Newness [facility] on 02/27/2023. This over-read does not include interpretation of cardiac or coronary anatomy or pathology nor does it include evaluation of the PET data. The cardiac PET-CT interpretation by the cardiologist is attached. COMPARISON:  Chest radiographs 07/15/2021. Abdominal CT 03/15/2016 and chest CT 06/27/2009. FINDINGS: Mediastinum/Nodes: No enlarged lymph nodes within the visualized mediastinum.Cardiac pacemaker and extensive atherosclerosis of the aorta and coronary arteries noted Lungs/Pleura: No pleural effusion or pneumothorax. Streaky opacities at both lung bases, likely reflecting atelectasis. There may be mild edema. No confluent airspace disease or suspicious pulmonary nodule. Upper abdomen: Calcified gallstones are again noted. Musculoskeletal/Chest wall: No chest wall mass or suspicious osseous findings within the visualized chest. There is a sebaceous cyst in the midline lower back which has mildly enlarged from the remote chest CT, measuring up to 4.3 cm in diameter on image 11/3. IMPRESSION: 1. No significant extracardiac findings identified. 2. Probable mild  pulmonary edema and bibasilar atelectasis. 3. Cholelithiasis. 4. Enlarging sebaceous cyst in the midline lower back. 5.  Aortic Atherosclerosis (ICD10-I70.0). Electronically Signed   By: Carey Bullocks M.D.   On: 02/27/2023 13:42   Disposition   Pt is being discharged home today in good condition.  Follow-up Plans & Appointments     Follow-up Information     David Levering, NP Follow up.   Specialty: Cardiology Why: David [name] - [facility] location - cardiology follow-up arranged on Thursday Mar 14, 2023 at 8:50 AM. Please arrive 15 minutes prior to appointment to check in. David [name] is one of our nurse  practitioners that works with David [name]. Contact information: [address] [place] [address] [phone]                Discharge Instructions     AMB Referral to Cardiac Rehabilitation - Phase II   Complete by: As directed    Diagnosis: PTCA   After initial evaluation and assessments completed: Virtual Based Care may be provided alone or in conjunction with Phase 2 Cardiac Rehab based on patient barriers.: Yes   Intensive Cardiac Rehabilitation (ICR) [facility] location only OR Traditional Cardiac Rehabilitation (TCR) *If criteria for ICR are not met will enroll in TCR [facility] only): Yes   Diet - low sodium heart healthy   Complete by: As directed    Discharge instructions   Complete by: As directed    You were started on a new blood thinner called clopidogrel (Plavix). This is in addition to your aspirin. We sent this into our transition of care pharmacy on site to be delivered to your room before discharge.  To avoid thinning your blood too much, Dr. [name] recommended to stop your cliostazol (Pletal) for now.  He also recommended decreasing your isosorbide to 60mg  twice a [name]. Since you were already on this medicine at home, we sent in the updated prescription to [facility] as we discussed (for the 60mg  tablet dosage).   Increase activity slowly    Complete by: As directed    No driving for 2 days. Do not resume driving if you have otherwise been told not to drive for other reasons. No lifting over 5 lbs for 1 week. No sexual activity for 1 week. Keep procedure site clean & dry. If you notice increased pain, swelling, bleeding or pus, call/return!  You may shower, but no soaking baths/hot tubs/pools for 1 week.        Discharge Medications   Allergies as of 03/05/2023       Reactions   Azithromycin Diarrhea   Lactose Intolerance (gi) Diarrhea   Niacin And Related Diarrhea   Sulfa Antibiotics Rash        Medication List     STOP taking these medications    cilostazol 100 MG tablet Commonly known as: PLETAL       TAKE these medications    amLODipine 2.5 MG tablet Commonly known as: NORVASC Take 1 tablet (2.5 mg total) by mouth daily.   aspirin EC 81 MG tablet Take 81 mg by mouth daily.   clopidogrel 75 MG tablet Commonly known as: PLAVIX Take 1 tablet (75 mg total) by mouth daily. Start taking on: March 06, 2023   icosapent Ethyl 1 g capsule Commonly known as: VASCEPA Take 2 capsules (2 g total) by mouth 2 (two) times daily.   isosorbide mononitrate 60 MG 24 hr tablet Commonly known as: IMDUR Take 1 tablet (60 mg total) by mouth in the morning and at bedtime. What changed:  medication strength how much to take how to take this when to take this additional instructions   lisinopril 10 MG tablet Commonly known as: ZESTRIL Take 1 tablet (10 mg total) by mouth daily.   metoprolol tartrate 100 MG tablet Commonly known as: LOPRESSOR TAKE 1 TABLET BY MOUTH TWICE  DAILY   nitroGLYCERIN 0.4 MG SL tablet Commonly known as: NITROSTAT Place 1 tablet (0.4 mg total) under the tongue every 5 (five) minutes as needed for chest pain (May take every 5 minutes as needed for chest pain. Do not take more  than 3 doses. If chest pain not resolved after 2-3 doses, call 911).   Pepcid 20 MG tablet Generic drug:  famotidine Take 20 mg by mouth 2 (two) times daily.   rosuvastatin 40 MG tablet Commonly known as: CRESTOR Take 1 tablet (40 mg total) by mouth at bedtime.           Allergies Allergies  Allergen Reactions   Azithromycin Diarrhea   Lactose Intolerance (Gi) Diarrhea   Niacin And Related Diarrhea   Sulfa Antibiotics Rash    Outstanding Labs/Studies   N/A  Duration of Discharge Encounter   Greater than 30 minutes including physician time.  Signed, Laurann Montana, PA-C 03/05/2023, 2:16 PM  I have examined the patient and reviewed assessment and plan and discussed with patient.  Agree with above as stated.    Successful PTCA of the SVG to OM.  SVG to RCA is occluded which is new from 2014.  He had pain before the procedure. No pain with balloon inflations with occlusion of vein graft. Calcification of the lesion made full balloon expansion difficult.  If he has recurrent sx with restenosis, would have to consider IVL.   Lance Muss

## 2023-03-05 NOTE — Discharge Instructions (Signed)

## 2023-03-05 NOTE — Progress Notes (Signed)
Patient stated he hurt in chest 1 / 10 , no worse and did not feel he needed NTG. Patient was placed on 2 liters O 2 and stated he thought it was helping. Patient instructed to call if worsens.

## 2023-03-05 NOTE — Progress Notes (Signed)
Left wrist post cath site examined, dressing in place, no bleeding, small bruise, no tenderness, strong grip, 2+ pulse, no neurovascular deficit. Patient can be discharged on time.

## 2023-03-05 NOTE — Progress Notes (Signed)
Patient walked to the bathroom with no difficulties

## 2023-03-06 ENCOUNTER — Encounter (HOSPITAL_COMMUNITY): Payer: Self-pay | Admitting: Interventional Cardiology

## 2023-03-06 MED FILL — Nitroglycerin IV Soln 100 MCG/ML in D5W: INTRA_ARTERIAL | Qty: 10 | Status: AC

## 2023-03-06 NOTE — Telephone Encounter (Signed)
Patient contacted regarding discharge from [facility] on 03/05/23.  Patient understands to follow up with provider David [name] on 03/14/23 at 08:50 am at [facility]. Patient understands discharge instructions? Yes Patient understands medications and regiment? Yes Patient understands to bring all medications to this visit? Yes  Ask patient:  Are you enrolled in My Chart Yes

## 2023-03-11 ENCOUNTER — Telehealth (HOSPITAL_COMMUNITY): Payer: Self-pay

## 2023-03-11 NOTE — Telephone Encounter (Signed)
Pt is not interested in the cardiac rehab program. Closed referral 

## 2023-03-13 NOTE — Progress Notes (Unsigned)
Cardiology Clinic Note   Date: 03/14/2023 ID: ZEEK HIDROGO, DOB [birth year], MRN [id number]  Primary Cardiologist:  Thurmon Fair, MD  Patient Profile    David [name] is a 89 y.o. male who presents to the clinic today for hospital follow up.     Past medical history significant for: CAD. CABG x 5 09/10/1988: LIMA to LAD, SVG to PDA PL, sequential SVG to RI, OM1 and OM 2. LHC 04/09/2000 (angina): Severe native CAD.  Mid LAD 75%.  Mid RCA 100%.  Proximal LCx 100%.  Patent grafts. LHC 01/04/2006 (angina): Proximal 100%, proximal LCx 100%, mid RCA 100%.  Patent LIMA with mid to distal LAD occlusion beyond LIMA with retrograde collateral flow from RCA collaterals.  PCI with stent to SVG to PDA, stent to SVG to OM1/OM 2/RI. LHC 03/04/2013 (high risk nuclear stress test): Patent LIMA to LAD.  Patent SVG to PDA with occluded sequential limb to PLA.  SVG to OM1 and OM 2 with 90% mid in-stent restenosis and 90% stenosis distal to previously placed stent.  PCI with [place] Balloon angioplasty and DES to SVG to OM1 and OM 2. LHC 03/05/2023 (abnormal PET stress): Patent LIMA to LAD.  SVG to RCA occluded.  Proximal graft lesion 100%.  Proximal graft lesion before OM1 20%.  OM graft in-stent lesion-mid graft lesion before OM1 90%.  PTCA of calcified lesion in prior stent (2014) and SVG to OM jump graft.  Unable to fully expand lesion. Ischemic cardiomyopathy. Echo 02/23/2021: EF 45 to 50%.  Apical akinesis.  Grade I DD.  Elevated left atrial pressure.  Normal RV function.  Trivial MR.  Mild to moderate aortic valve sclerosis/calcification without stenosis. Complete heart block. S/p PPM placement 1995. GEN change 2002 and 2009. Lead revision 2017. Remote device check 12/24/2022: Pacemaker dependent.  Battery status good and lead measurements stable.  No clinically significant episodes of high ventricular rate or atrial mode switch.  Brief PAT. Carotid artery disease. Carotid duplex 09/10/2022: Right ICA 60 to 79%,  ECA > 50%.  Left ICA 1 to 39%.  Disturbed flow right subclavian. PAD. PV angiogram 08/31/2013: Unsuccessful PTA attempt CTO left popliteal artery. PV angiogram 10/19/2013: Successful atherectomy, PTA and stenting right popliteal artery. Vascular ultrasound lower extremity/ABI 02/15/2022: Moderate bilateral lower extremity arterial disease.  Right: Moderate progression compared to prior study.  Extensive calcifications throughout.  Distal SFA now 50 to 74%.  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  Patent popliteal artery stent without evidence of stenosis.  Left: Moderate progression compared to prior study.  Extensive calcifications throughout.  Distal SFA now 50 to 74%.  Known left popliteal artery to TPT stent occlusion.  Native TPT occlusion. Non-Hodgkin's lymphoma.     History of Present Illness    David [name] is a longtime patient of cardiology.  He is followed by Dr. [name] and Dr. Allyson Sabal for the above outlined history.  Patient has a history of extensive CAD with CABG in 1990 and multiple revascularization procedures as well as mild ischemia cardiomyopathy.  He also has a history of revascularization procedures bilateral lower extremities and asymptomatic stenosis of carotid arteries followed by Dr. Allyson Sabal.  He has complete heart block with PPM placement in 1995 followed by generator change x 2 and lead revision (details above).  Patient was last seen in the office by Dr. [name] on 02/14/2023 with complaints of slowly progressing angina.  He noted chest discomfort when making the bed or climbing a few stairs requiring  rest or SL NTG.  He was averaging NTG approximately twice a week in addition to his established aggressive antianginal therapy.  He was started on amlodipine at underwent PET CT.  PET stress findings consistent with extensive ischemia.  He underwent left heart catheterization and PTCA (as detailed above).  Today, patient is doing well. Patient denies shortness of  breath or dyspnea on exertion. No chest pain, pressure, or tightness. Denies lower extremity edema, orthopnea, or PND. No palpitations. He complains of a little more fatigue than usual. He admits to not doing much activity or exercise. His cath site is healing well without pain.      ROS: All other systems reviewed and are otherwise negative except as noted in History of Present Illness.  Studies Reviewed    EKG Interpretation Date/Time:  Thursday March 14 2023 08:52:19 EDT Ventricular Rate:  62 PR Interval:  218 QRS Duration:  148 QT Interval:  442 QTC Calculation: 448 R Axis:   -88  Text Interpretation: AV dual-paced rhythm with prolonged AV conduction When compared with ECG of 05-Mar-2023 13:00, No significant change was found Confirmed by Carlos Levering [phone]) on 03/14/2023 8:56:11 AM           Physical Exam    VS:  BP (!) 106/58   Pulse 62   Ht 5\' 9"  (1.753 m)   Wt 194 lb 6.4 oz (88.2 kg)   SpO2 98%   BMI 28.71 kg/m  , BMI Body mass index is 28.71 kg/m.  GEN: Well nourished, well developed, in no acute distress. Neck: No JVD or carotid bruits. Cardiac: RRR. No murmurs. No rubs or gallops.   Respiratory:  Respirations regular and unlabored. Clear to auscultation without rales, wheezing or rhonchi. GI: Soft, nontender, nondistended. Extremities: Radials 2+ and equal bilaterally. No clubbing or cyanosis. No edema. Healing eccymosis at cath cite without erythema, edema or drainage.  Skin: Warm and dry, no rash. Neuro: Strength intact.  Assessment & Plan    CAD.  Extensive history with CABG in 1990 and multiple revascularization procedures; most recent July 2024 with PTCA to SVG to OM jump graft. Patient denies chest pain, pressure or tightness. He has some fatigue. His BP is lower than usual (typically runs in 120-130s/60) since starting amlodipine. He is instructed to monitor it. If it continues to be low or he develops symptoms would consider lower metoprolol.  Continue aspirin, Plavix, amlodipine, Vascepa, isosorbide, metoprolol, rosuvastatin, as needed SL NTG. Ischemic cardiomyopathy.  Echo July 2022 showed EF 45 to 50%, Grade I DD.  Patient denies shortness of breath, DOE, lower extremity edema, orthopnea or PND. Euvolemic and well compensated on exam.  Continue metoprolol, lisinopril, isosorbide. Complete heart block.  S/p PPM placement 1995, GEN change x 2 2002 and 2009, lead revision 2017.  Remote check May 2024 showed pacemaker dependent with good battery status and stable lead measurements.  Patient denies lightheadedness, dizziness, presyncope or syncope.  Carotid artery stenosis.  Carotid duplex February 2024 showed mild progression of stenosis, right ICA 60 to 79% and left ICA 1 to 39%.  Patient denies lightheadedness, dizziness, presyncope, syncope.  Continue to follow with Dr. Allyson Sabal.  Plan for repeat carotid ultrasound February 2025. PAD.  Unsuccessful PTA left popliteal artery January 2015.  Atherectomy, PTA, stent right popliteal artery March 2015.  Vascular ultrasound/ABI July 2023 showed moderate bilateral lower extremity arterial disease with moderate progression compared to prior study.  Patient's activity continues to be limited by claudication. Followed by Dr. Allyson Sabal.  Continue rosuvastatin, Vascepa, aspirin, Plavix.  Disposition: Return for previoulsy scheduled visit with Dr. [name] on 06/03/2023 or sooner as needed.          Signed, Etta Grandchild. , DNP, NP-C

## 2023-03-14 ENCOUNTER — Encounter: Payer: Self-pay | Admitting: Student

## 2023-03-14 ENCOUNTER — Ambulatory Visit: Payer: Medicare Other | Attending: Student | Admitting: Student

## 2023-03-14 VITALS — BP 106/58 | HR 62 | Ht 69.0 in | Wt 194.4 lb

## 2023-03-14 DIAGNOSIS — I6523 Occlusion and stenosis of bilateral carotid arteries: Secondary | ICD-10-CM | POA: Diagnosis not present

## 2023-03-14 DIAGNOSIS — I442 Atrioventricular block, complete: Secondary | ICD-10-CM | POA: Diagnosis not present

## 2023-03-14 DIAGNOSIS — I739 Peripheral vascular disease, unspecified: Secondary | ICD-10-CM | POA: Diagnosis not present

## 2023-03-14 DIAGNOSIS — I255 Ischemic cardiomyopathy: Secondary | ICD-10-CM | POA: Insufficient documentation

## 2023-03-14 DIAGNOSIS — I2510 Atherosclerotic heart disease of native coronary artery without angina pectoris: Secondary | ICD-10-CM | POA: Diagnosis not present

## 2023-03-14 NOTE — Patient Instructions (Signed)
Medication Instructions:  No changes *If you need a refill on your cardiac medications before your next appointment, please call your pharmacy*   Lab Work: none If you have labs (blood work) drawn today and your tests are completely normal, you will receive your results only by: MyChart Message (if you have MyChart) OR A paper copy in the mail If you have any lab test that is abnormal or we need to change your treatment, we will call you to review the results.   Testing/Procedures: none   Follow-Up: At [facility], you and your health needs are our priority.  As part of our continuing mission to provide you with exceptional heart care, we have created designated Provider Care Teams.  These Care Teams include your primary Cardiologist (physician) and Advanced Practice Providers (APPs -  Physician Assistants and Nurse Practitioners) who all work together to provide you with the care you need, when you need it.  We recommend signing up for the patient portal called "MyChart".  Sign up information is provided on this After Visit Summary.  MyChart is used to connect with patients for Virtual Visits (Telemedicine).  Patients are able to view lab/test results, encounter notes, upcoming appointments, etc.  Non-urgent messages can be sent to your provider as well.   To learn more about what you can do with MyChart, go to [email or web address].    Your next appointment:    Keep scheduled appointment

## 2023-03-15 ENCOUNTER — Other Ambulatory Visit: Payer: Self-pay

## 2023-03-15 MED ORDER — CLOPIDOGREL BISULFATE 75 MG PO TABS: 75.0000 mg | ORAL_TABLET | Freq: Every day | ORAL | 3 refills | Status: DC

## 2023-03-28 ENCOUNTER — Telehealth: Payer: Self-pay | Admitting: Cardiovascular Disease

## 2023-03-28 NOTE — Telephone Encounter (Signed)
Pt c/o medication issue:  1. Name of Medication: clopidogrel (PLAVIX) 75 MG tablet   2. How are you currently taking this medication (dosage and times per day)?   Take 1 tablet (75 mg total) by mouth daily    3. Are you having a reaction (difficulty breathing--STAT)? No  4. What is your medication issue? Patient is calling because his insurance denied this medication. Patient states the insurance has tried to reach out to [place] twice and never heard back from [place]. Patient only has 7 tablets left and has a few questions about getting this medication. Please advise.

## 2023-03-28 NOTE — Telephone Encounter (Signed)
Returned pt call. He is needing a new prior authorization for Plavix 75mg . Advised pt this will be sent to the RX prior auth team. Pt verbalized understanding.

## 2023-03-29 ENCOUNTER — Telehealth: Payer: Self-pay | Admitting: Cardiovascular Disease

## 2023-03-29 ENCOUNTER — Other Ambulatory Visit (HOSPITAL_COMMUNITY): Payer: Self-pay

## 2023-03-29 MED ORDER — CLOPIDOGREL BISULFATE 75 MG PO TABS: 75.0000 mg | ORAL_TABLET | Freq: Every day | ORAL | 3 refills | Status: DC

## 2023-03-29 NOTE — Telephone Encounter (Signed)
Returned call to pt. Informed pt the script was sent over to [facility] again. Pt verbalized understanding.

## 2023-03-29 NOTE — Telephone Encounter (Signed)
Returned pt's call. Informed pt of medication being filled at [facility]. Pt verbalized understanding.

## 2023-03-29 NOTE — Telephone Encounter (Signed)
Patient is calling about his Plavix, he states he spoke to nurse Dusty who advised him that [facility] said they sent the prescription to [facility].  He states he called [facility] and they told him they don't have a prescription for him for this medication.  Please see prior phone note.

## 2023-03-29 NOTE — Telephone Encounter (Signed)
Returned pt call. Informed pt this nurse called [facility] and they just needed to do a verification and the medication will be sent out to him. Pt verbalized understanding.

## 2023-03-29 NOTE — Telephone Encounter (Signed)
Pharmacy Patient Advocate Encounter   Received notification from Physician's Office that prior authorization for PLAVIX is required/requested.  /Per test claim: Refill too soon. PA is not needed at this time. Medication was filled 03/25/21. Next eligible fill date is 06/02/23.

## 2023-03-29 NOTE — Telephone Encounter (Signed)
Pt would like a callback from nurses Dusty, regarding call from earlier about medication Plavix. Pt states that he spoke to Pharmacy and they state that it is too soon for him to get medication. Pt states that he never got medication, so he's not understanding how it's "too soon" for him to pickup. Please advise

## 2023-04-02 ENCOUNTER — Telehealth: Payer: Self-pay | Admitting: Cardiovascular Disease

## 2023-04-02 ENCOUNTER — Ambulatory Visit (INDEPENDENT_AMBULATORY_CARE_PROVIDER_SITE_OTHER): Payer: Medicare Other

## 2023-04-02 DIAGNOSIS — I442 Atrioventricular block, complete: Secondary | ICD-10-CM | POA: Diagnosis not present

## 2023-04-02 LAB — CUP PACEART REMOTE DEVICE CHECK
Battery Remaining Longevity: 66 mo
Battery Remaining Percentage: 66 %
Brady Statistic RA Percent Paced: 80 %
Brady Statistic RV Percent Paced: 99 %
Date Time Interrogation Session: 20240827022100
Implantable Lead Connection Status: 753985
Implantable Lead Connection Status: 753985
Implantable Lead Implant Date: 20170425
Implantable Lead Implant Date: 20170425
Implantable Lead Location: 753859
Implantable Lead Location: 753860
Implantable Lead Model: 7741
Implantable Lead Model: 7742
Implantable Lead Serial Number: 720495
Implantable Lead Serial Number: 763888
Implantable Pulse Generator Implant Date: 20170425
Lead Channel Impedance Value: 703 Ohm
Lead Channel Impedance Value: 834 Ohm
Lead Channel Pacing Threshold Amplitude: 0.6 V
Lead Channel Pacing Threshold Pulse Width: 0.4 ms
Lead Channel Setting Pacing Amplitude: 1 V
Lead Channel Setting Pacing Amplitude: 2.5 V
Lead Channel Setting Pacing Pulse Width: 0.4 ms
Lead Channel Setting Sensing Sensitivity: 2.5 mV
Pulse Gen Serial Number: 718025
Zone Setting Status: 755011

## 2023-04-02 NOTE — Telephone Encounter (Signed)
Itching may indicate allergy to med. It's an antiplatelet so bruising is common unfortunately. Not common for med to cause fatigue and constipation. Would defer to MD regarding potential change in antiplatelet therapy given recent PTCA.

## 2023-04-02 NOTE — Telephone Encounter (Signed)
Will forward to pharm d

## 2023-04-02 NOTE — Telephone Encounter (Signed)
Pt c/o medication issue:  1. Name of Medication:   clopidogrel (PLAVIX) 75 MG tablet    2. How are you currently taking this medication (dosage and times per day)?   Take 1 tablet (75 mg total) by mouth daily.    3. Are you having a reaction (difficulty breathing--STAT)? No  4. What is your medication issue? Pt states he is experiencing itchiness, bleeding under the skin, fatigue, and constipation.

## 2023-04-03 NOTE — Telephone Encounter (Signed)
Returned pt call with Dr. Blanchie Dessert recommendation. The rash is all over and wants to take Benadryl. Please advise. He is still taking the plavix but the rash is becoming uncomfortable. Please advise.     Itchiness at the cath site? If there is a hematoma there (bleeding under the skin), that is normal since the blood would be resorbing -which takes weeks. If there is generalized body rash or itching all over, that may be allergy, but it sounds unlikely.  Would continue to take clopidogrel without missed doses - d/w Dr. Salena Saner when he returns. Ok to take benadryl as per package insert.  Dr. Rennis Golden

## 2023-04-03 NOTE — Telephone Encounter (Signed)
The patient is calling to follow up and would like to know if he can use Benadryl for the itchiness.

## 2023-04-03 NOTE — Telephone Encounter (Signed)
Will forward to pharm d for recommendation in regards to the benadryl and to Dr. Salena Saner for pharmacist advice.

## 2023-04-03 NOTE — Telephone Encounter (Signed)
Called pt back to let him know that per Dr. Rennis Golden it is ok to take the Benadryl per package instructions. Pt. Verbalized understanding.

## 2023-04-04 ENCOUNTER — Other Ambulatory Visit: Payer: Self-pay | Admitting: Cardiovascular Disease

## 2023-04-09 ENCOUNTER — Telehealth: Payer: Self-pay | Admitting: Cardiovascular Disease

## 2023-04-09 NOTE — Telephone Encounter (Signed)
I called David [name] and spoke to him.  He is still itching quite a bit on his arms and torso, but not on his legs or face.  No obvious rash. I strongly doubt that it is related to clopidogrel since he took this medication for long periods of time at least twice before in 2014 and 2017 for other stent procedures.  Continue taking clopidogrel daily. He has not had any angina since the angioplasty procedure.  He can stop taking the amlodipine, which is otherwise the newest medication we have started.   Can try a nonsedating antihistamine such as loratadine or fexofenadine instead of the Benadryl. Can use 1% hydrocortisone cream OTC. Use hydrating cream such as Eucerin or Aveeno.  Please take the amlodipine off his list.

## 2023-04-09 NOTE — Telephone Encounter (Signed)
Patient states he is still itching after 4 Benadryl a day. This does make him sleepy which is difficult.  (See previous message) He states this has been since starting Plavix. He states "not a rash so much as just itching.  It is both arms and stomach and back.  He has been scratching to the point of bleeding to some of the scratches.  He has not used a cream as it states not to use will the pills. He states it is an off brand benadryl cream. He is trying topical lotions as the skin is dry as well.  No other symptoms or difficulty breathing.

## 2023-04-09 NOTE — Telephone Encounter (Signed)
See previous encounter.  Patient states he has been taking Benadryl and he is still itchy. Please advise.

## 2023-04-16 DIAGNOSIS — Z23 Encounter for immunization: Secondary | ICD-10-CM | POA: Diagnosis not present

## 2023-04-16 NOTE — Progress Notes (Signed)
Remote pacemaker transmission.   

## 2023-04-19 NOTE — Telephone Encounter (Signed)
Patient states understanding . He will call dermatology for appt.  If a referral is needed he will call office back.  He will call if issues continue

## 2023-04-19 NOTE — Telephone Encounter (Signed)
Pt calling back stating he is still itching badly. Pt requesting to speak with Dr. Salena Saner again

## 2023-04-19 NOTE — Telephone Encounter (Signed)
I'm afraid I do not have a lot of additional ideas. He has to stay on the antiplatelet agent (clopidogrel) and this did not cause skin issues when he took it a few years ago. Maybe refer to a dermatologist?

## 2023-04-19 NOTE — Telephone Encounter (Signed)
Patient stopped amlodipine. Stopped the cream and Benadryl as itching was better, but then itching started again and states it is as bad as when it first started. He restarted both cream and antihistamine. States BP without amlodipine is good.  Last BP 124/68 . Please advise what he can do for the itching

## 2023-04-22 DIAGNOSIS — L820 Inflamed seborrheic keratosis: Secondary | ICD-10-CM | POA: Diagnosis not present

## 2023-04-22 DIAGNOSIS — L298 Other pruritus: Secondary | ICD-10-CM | POA: Diagnosis not present

## 2023-04-26 DIAGNOSIS — H40053 Ocular hypertension, bilateral: Secondary | ICD-10-CM | POA: Diagnosis not present

## 2023-04-26 DIAGNOSIS — H524 Presbyopia: Secondary | ICD-10-CM | POA: Diagnosis not present

## 2023-06-03 ENCOUNTER — Encounter: Payer: Self-pay | Admitting: Cardiovascular Disease

## 2023-06-03 ENCOUNTER — Ambulatory Visit: Payer: Medicare Other | Attending: Cardiovascular Disease | Admitting: Cardiovascular Disease

## 2023-06-03 ENCOUNTER — Other Ambulatory Visit (HOSPITAL_COMMUNITY): Payer: Self-pay

## 2023-06-03 VITALS — BP 120/64 | HR 61 | Ht 69.5 in | Wt 194.6 lb

## 2023-06-03 DIAGNOSIS — I10 Essential (primary) hypertension: Secondary | ICD-10-CM | POA: Insufficient documentation

## 2023-06-03 DIAGNOSIS — Z950 Presence of cardiac pacemaker: Secondary | ICD-10-CM | POA: Insufficient documentation

## 2023-06-03 DIAGNOSIS — I442 Atrioventricular block, complete: Secondary | ICD-10-CM | POA: Diagnosis not present

## 2023-06-03 DIAGNOSIS — I25708 Atherosclerosis of coronary artery bypass graft(s), unspecified, with other forms of angina pectoris: Secondary | ICD-10-CM | POA: Insufficient documentation

## 2023-06-03 DIAGNOSIS — I495 Sick sinus syndrome: Secondary | ICD-10-CM | POA: Diagnosis not present

## 2023-06-03 MED ORDER — LISINOPRIL 5 MG PO TABS
5.0000 mg | ORAL_TABLET | Freq: Every day | ORAL | 3 refills | Status: DC
  Filled 2023-06-03: qty 90, 90d supply, fill #0

## 2023-06-03 MED ORDER — LISINOPRIL 5 MG PO TABS: 5.0000 mg | ORAL_TABLET | Freq: Every day | ORAL | 3 refills | Status: DC

## 2023-06-03 NOTE — Patient Instructions (Signed)
Medication Instructions:  Decrease Lisinopril to 5 mg daily STOP taking Clopidogrel (Plavix) in 3 months= Late January *If you need a refill on your cardiac medications before your next appointment, please call your pharmacy*  Follow-Up: At [facility], you and your health needs are our priority.  As part of our continuing mission to provide you with exceptional heart care, we have created designated Provider Care Teams.  These Care Teams include your primary Cardiologist (physician) and Advanced Practice Providers (APPs -  Physician Assistants and Nurse Practitioners) who all work together to provide you with the care you need, when you need it.  We recommend signing up for the patient portal called "MyChart".  Sign up information is provided on this After Visit Summary.  MyChart is used to connect with patients for Virtual Visits (Telemedicine).  Patients are able to view lab/test results, encounter notes, upcoming appointments, etc.  Non-urgent messages can be sent to your provider as well.   To learn more about what you can do with MyChart, go to [email or web address].    Your next appointment:   6 month(s)  Provider:   Thurmon Fair, MD

## 2023-06-07 ENCOUNTER — Encounter: Payer: Self-pay | Admitting: Cardiovascular Disease

## 2023-06-11 DIAGNOSIS — C672 Malignant neoplasm of lateral wall of bladder: Secondary | ICD-10-CM | POA: Diagnosis not present

## 2023-06-13 ENCOUNTER — Other Ambulatory Visit: Payer: Self-pay | Admitting: Physician Assistant

## 2023-07-02 ENCOUNTER — Ambulatory Visit (INDEPENDENT_AMBULATORY_CARE_PROVIDER_SITE_OTHER): Payer: Medicare Other

## 2023-07-02 DIAGNOSIS — I495 Sick sinus syndrome: Secondary | ICD-10-CM

## 2023-07-02 LAB — CUP PACEART REMOTE DEVICE CHECK
Battery Remaining Longevity: 66 mo
Battery Remaining Percentage: 63 %
Brady Statistic RA Percent Paced: 96 %
Brady Statistic RV Percent Paced: 100 %
Date Time Interrogation Session: 20241126022100
Implantable Lead Connection Status: 753985
Implantable Lead Connection Status: 753985
Implantable Lead Implant Date: 20170425
Implantable Lead Implant Date: 20170425
Implantable Lead Location: 753859
Implantable Lead Location: 753860
Implantable Lead Model: 7741
Implantable Lead Model: 7742
Implantable Lead Serial Number: 720495
Implantable Lead Serial Number: 763888
Implantable Pulse Generator Implant Date: 20170425
Lead Channel Impedance Value: 732 Ohm
Lead Channel Impedance Value: 822 Ohm
Lead Channel Pacing Threshold Amplitude: 0.7 V
Lead Channel Pacing Threshold Pulse Width: 0.4 ms
Lead Channel Setting Pacing Amplitude: 1.3 V
Lead Channel Setting Pacing Amplitude: 2.5 V
Lead Channel Setting Pacing Pulse Width: 0.4 ms
Lead Channel Setting Sensing Sensitivity: 2.5 mV
Pulse Gen Serial Number: 718025
Zone Setting Status: 755011

## 2023-07-12 ENCOUNTER — Other Ambulatory Visit: Payer: Self-pay | Admitting: Cardiovascular Disease

## 2023-07-16 ENCOUNTER — Telehealth: Payer: Self-pay | Admitting: Cardiovascular Disease

## 2023-07-16 DIAGNOSIS — I6529 Occlusion and stenosis of unspecified carotid artery: Secondary | ICD-10-CM

## 2023-07-16 NOTE — Telephone Encounter (Signed)
Runell Gess, MD 09/10/2022  5:14 PM EST     Mild progression of BICA stenosis. Repeat 12 months   Runell Gess, MD 09/10/2022 10:16 AM EST     Mild progression of bilateral ICA stenosis.  Repeat 12 months mild progression mild progression of bilateral ICA stenosis    Patient identification verified by 2 forms. Marilynn Rail, RN    Called and spoke to patient  Patient states:   -typically gets ultrasound annually  Informed patient RN review previous result, saw provider recommend repeat 12 month, order placed  Patient verbalized understanding, no questions at this time   My chart message also sent.

## 2023-07-16 NOTE — Telephone Encounter (Signed)
Patient states he received a notice about scheduling a carotid but there is not an order in the system. Please advise.

## 2023-08-05 NOTE — Progress Notes (Signed)
Remote pacemaker transmission.   

## 2023-08-15 ENCOUNTER — Ambulatory Visit (HOSPITAL_COMMUNITY)
Admission: RE | Admit: 2023-08-15 | Discharge: 2023-08-15 | Disposition: A | Discharge status: Home / Self Care | Payer: Medicare Other | Source: Ambulatory Visit | Attending: Cardiovascular Disease | Admitting: Cardiovascular Disease

## 2023-08-15 DIAGNOSIS — I442 Atrioventricular block, complete: Secondary | ICD-10-CM | POA: Diagnosis not present

## 2023-08-15 DIAGNOSIS — N183 Chronic kidney disease, stage 3 unspecified: Secondary | ICD-10-CM | POA: Diagnosis not present

## 2023-08-15 DIAGNOSIS — I5022 Chronic systolic (congestive) heart failure: Secondary | ICD-10-CM | POA: Diagnosis not present

## 2023-08-15 DIAGNOSIS — Z8551 Personal history of malignant neoplasm of bladder: Secondary | ICD-10-CM | POA: Diagnosis not present

## 2023-08-15 DIAGNOSIS — I739 Peripheral vascular disease, unspecified: Secondary | ICD-10-CM | POA: Diagnosis not present

## 2023-08-15 DIAGNOSIS — I6529 Occlusion and stenosis of unspecified carotid artery: Secondary | ICD-10-CM | POA: Diagnosis not present

## 2023-08-15 DIAGNOSIS — C8590 Non-Hodgkin lymphoma, unspecified, unspecified site: Secondary | ICD-10-CM | POA: Diagnosis not present

## 2023-08-15 DIAGNOSIS — I6521 Occlusion and stenosis of right carotid artery: Secondary | ICD-10-CM | POA: Insufficient documentation

## 2023-08-15 DIAGNOSIS — E782 Mixed hyperlipidemia: Secondary | ICD-10-CM | POA: Diagnosis not present

## 2023-08-15 DIAGNOSIS — I119 Hypertensive heart disease without heart failure: Secondary | ICD-10-CM | POA: Diagnosis not present

## 2023-08-15 DIAGNOSIS — Z950 Presence of cardiac pacemaker: Secondary | ICD-10-CM | POA: Diagnosis not present

## 2023-08-15 DIAGNOSIS — Z0000 Encounter for general adult medical examination without abnormal findings: Secondary | ICD-10-CM | POA: Diagnosis not present

## 2023-08-15 DIAGNOSIS — I25708 Atherosclerosis of coronary artery bypass graft(s), unspecified, with other forms of angina pectoris: Secondary | ICD-10-CM | POA: Diagnosis not present

## 2023-08-15 DIAGNOSIS — M4800 Spinal stenosis, site unspecified: Secondary | ICD-10-CM | POA: Diagnosis not present

## 2023-08-15 LAB — LAB REPORT - SCANNED
Creatinine, POC: 129 mg/dL
EGFR: 56
Microalb Creat Ratio: 31
Microalbumin, Urine: 4.01

## 2023-08-20 ENCOUNTER — Encounter: Payer: Self-pay | Admitting: Cardiovascular Disease

## 2023-08-21 NOTE — Telephone Encounter (Signed)
 Patient identification verified by 2 forms. Hilton Lucky, RN    Called and spoke to patient  Patient states:   -has chest pain and SOB   -sx occur when walking down hall, changing clothes   -sx last a few minutes, resolve after rest   -has history of chest pain with activity   -SOB is new   -chest pain is not as intense as chest pain prior to 02/2023 cath   -chest pain resumed after 10/28 OV with Dr. Alvis Ba   -chest pain has remained the same   -has slowed down a lot to not cause chest pain   -continues to take Imdur  60mg  twice a day   -chest pain has not been severe enough to need NTG  Patient scheduled for OV 1/20 at 9:20am  Reviewed ED warning signs/precautions  Patient verbalized understanding, no questions at this time

## 2023-08-22 NOTE — Telephone Encounter (Signed)
Patient identification verified by 2 forms. Marilynn Rail, RN    Called and spoke to patient  Reviewed Dr. [name] recommendations  Patient OV rescheduled for 1/21 at 8:50am  Patient agrees with plan, no questions at this time

## 2023-08-25 NOTE — Progress Notes (Unsigned)
Cardiology Clinic Note   Date: 08/27/2023 ID: David [name], DOB 1933-03-07, MRN [id number]  Primary Cardiologist:  Thurmon Fair, MD  Patient Profile    David [name] is a 89 y.o. male who presents to the clinic today for chest pain and shortness of breath.     Past medical history significant for: CAD. CABG x 5 09/10/1988: LIMA to LAD, SVG to PDA PL, sequential SVG to RI, OM1 and OM 2. LHC 04/09/2000 (angina): Severe native CAD.  Mid LAD 75%.  Mid RCA 100%.  Proximal LCx 100%.  Patent grafts. LHC 01/04/2006 (angina): Proximal 100%, proximal LCx 100%, mid RCA 100%.  Patent LIMA with mid to distal LAD occlusion beyond LIMA with retrograde collateral flow from RCA collaterals.  PCI with stent to SVG to PDA, stent to SVG to OM1/OM 2/RI. LHC 03/04/2013 (high risk nuclear stress test): Patent LIMA to LAD.  Patent SVG to PDA with occluded sequential limb to PLA.  SVG to OM1 and OM 2 with 90% mid in-stent restenosis and 90% stenosis distal to previously placed stent.  PCI with [place] Ball[place] angioplasty and DES to SVG to OM1 and OM 2. LHC 03/05/2023 (abnormal PET stress): Patent LIMA to LAD.  SVG to RCA occluded.  Proximal graft lesion 100%.  Proximal graft lesion before OM1 20%.  OM graft in-stent lesion-mid graft lesion before OM1 90%.  PTCA of calcified lesion in prior stent (2014) and SVG to OM jump graft.  Unable to fully expand lesion. Ischemic cardiomyopathy. Echo 02/23/2021: EF 45 to 50%.  Apical akinesis.  Grade I DD.  Elevated left atrial pressure.  Normal RV function.  Trivial MR.  Mild to moderate aortic valve sclerosis/calcification without stenosis. Complete heart block. S/p PPM placement 1995. GEN change 2002 and 2009. Lead revision 2017. Remote check in 06/2023 showed pacemaker dependent, battery status was good, lead measurements stable.  Carotid artery disease. Carotid duplex 08/15/23: Carotid duplex in 08/2023 R ICA consistent with 60-79% stenosis. PVD. PV angiogram 08/31/2013:  Unsuccessful PTA attempt CTO left popliteal artery. PV angiogram 10/19/2013: Successful atherectomy, PTA and stenting right popliteal artery. Vascular ultrasound lower extremity/ABI 02/15/2022: Moderate bilateral lower extremity arterial disease.  Right: Moderate progression compared to prior study.  Extensive calcifications throughout.  Distal SFA now 50 to 74%.  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  Patent popliteal artery stent without evidence of stenosis.  Left: Moderate progression compared to prior study.  Extensive calcifications throughout.  Distal SFA now 50 to 74%.  Known left popliteal artery to TPT stent occlusion.  Native TPT occlusion. Non-Hodgkin's lymphoma.     History of Present Illness    David [name] is followed by Dr. [name] and Dr. Allyson Sabal for the above outlined history. [name] has extensive CAD with history of a CABG in 1990 and multiple revascularization procedures as well as mild ischemic cardiomyopathy.  Been followed by Dr. Gery Pray for history of revascularization procedures for bilateral lower extremities and asymptomatic stenosis of carotid arteries.  Has a history of complete heart block with PPM placement in 1995 followed by generator exchange and lead revision.  Office visit in 02/2023 with Dr. [name] patient had complaints of slowly progressing angina.  He reported chest discomfort when making the bed or climbing a few stairs requiring rest or sublingual nitroglycerin.  He was requiring nitroglycerin approximately twice a week in addition to his antianginal therapy.  At that time he was started on amlodipine and underwent PET/CT.  PET stress findings consistent  with extensive ischemia, he underwent LHC that indicated occlusion of the native LAD, the major OM branch and the native right coronary artery. There was a patent LIMA to the distal LAD artery and the saphenous vein graft to the RCA was occluded. There was also 90% in-stent stenosis in the  saphenous vein graft to the oblique marginal artery. Angioplasty was performed without placement of a new stent and the recommendation was made for 6 months of dual antiplatelet therapy with aspirin and clopidogrel.  Following the catheterization he protracted.  Of severe itching without obvious rashes, finally resolved with antihistamines and topical cream.  Had previously taken clopidogrel and has had numerous contrast based studies without pruritus like this.  He was seen in clinic on 06/03/2023 by Dr. [name].  At that time he reported that he had greatly improved.  He can any nitroglycerin.  He reported angina that only occurred with greater activity than usual.  He was continued on long-acting nitrates and metoprolol.  His biggest concern at the time was fatigue, patient noted numbness in both legs for long period of times, consistent with his known lumbar spine stenosis.  Lisinopril was decreased to 5 mg daily as his diastolic blood pressure was consistently low.  On 08/21/2023 patient reported that he had been having chest pain and shortness of breath occur when walking down the hallway or changing close, will last for a few minutes and resolve with rest.  He had not required any sublingual nitroglycerin reported the chest pain was not as intense as chest pain prior to catheterization.  Today, patient presents for increased chest discomfort. Reports that his chest discomfort has improved since starting on increased dose of Imdur, however he remains "out of breath" even with small activities such as changing his clothes. He believes that his dyspnea on exertion started months ago but has been getting worse recently. He reports that his chest discomfort improved following increase in Imdur, notes his discomfort was similar to his pain at the start of last year however was not nearly as severe, he did not require sublingual nitroglycerin. He denies lower extremity edema, orthopnea or pnd.  He denies any  weight fluctuations.  ROS: All other systems reviewed and are otherwise negative except as noted in History of Present Illness.  EKGs/Labs Reviewed    EKG Interpretation Date/Time:  Tuesday August 27 2023 08:55:43 EST Ventricular Rate:  65 PR Interval:  204 QRS Duration:  162 QT Interval:  462 QTC Calculation: 480 R Axis:   -82  Text Interpretation: AV dual-paced rhythm When compared with ECG of 03-Jun-2023 09:04, Vent. rate has increased BY   4 BPM Confirmed by Reather Littler [phone]) on 08/27/2023 9:02:36 AM   02/28/2023: BUN 19; Creatinine, Ser 1.18; Potassium 4.6; Sodium 142   02/28/2023: Hemoglobin 12.7; WBC 5.7   08/15/2023: Hemoglobin 13.2, hematocrit 39.3, WBC 5.7, sodium 139, potassium 4.4, creatinine 1.23, EGFR 56, AST 23, ALT 22  Physical Exam    VS:  BP 108/60   Pulse 65   Ht 5\' 9"  (1.753 m)   Wt 197 lb (89.4 kg)   SpO2 96%   BMI 29.09 kg/m  , BMI Body mass index is 29.09 kg/m.  GEN: Well nourished, well developed, in no acute distress. Neck: No JVD or carotid bruits. Cardiac:  RRR. No murmurs. No rubs or gallops.   Respiratory:  Respirations regular and unlabored. Clear to auscultation without rales, wheezing or rhonchi. GI: Soft, nontender, nondistended. Extremities: Radials/DP/PT 2+ and equal bilaterally.  No clubbing or cyanosis. No edema   Skin: Warm and dry, no rash. Neuro: Strength intact.  Assessment & Plan   CAD: Extensive history of CABG in 1990 and multiple revascularization procedures.  Patient underwent LHC in July 2024 with PTCA to SVG to OMG graft. Today patient reports chest pain has improved since increased dose of Imdur. He denies needing to use any sublingual nitroglycerin.  Patient reports increased dyspnea on exertion that he believes has been ongoing for months but may have worsened recently.  He would prefer not to undergo repeat catheterization.  He is currently on Imdur 90 mg in the morning and at bedtime, lisinopril 5 mg daily and metoprolol  tartrate 100 mg twice daily.  Considered addition of amlodipine however he was on this previously and ended up having significant itching following started medication.  Deferred medication changes at this time.  Etiology of DOE unclear given significant history of CAD and cardiomyopathy.  Will check echocardiogram as noted below. Reviewed ED precautions. Continue aspirin 81 mg daily, Plavix 75 mg daily until the end of January, Vascepa 2 g twice daily, Imdur 90 mg twice daily, lisinopril 5 mg daily, metoprolol tartrate 100 mg twice daily and Crestor 40 mg daily.  Ischemic cardiomyopathy/dyspnea on exertion: Echo in July 2022 indicated EF 45 to 50%, Grade I DD.  On nuclear Q GS EF was 36% on 01/18/2021.  PET stress on 02/27/2023 indicated rest EF 47%, stress EF 36%. Today he appears euvolemic and well compensated on exam.  He denies lower extremity edema, orthopnea or PND.  He reports his weights have been stable.  He reports ongoing dyspnea on exertion, he notes that this has been ongoing for months however has gotten slightly worse in recent weeks.  He believes he may have had dyspnea on exertion even prior to his cardiac catheterization last year.  Could consider starting SGLT2 inhibitor, deferred for now.  Will check echocardiogram.  Reviewed ED precautions.  Continue Imdur, lisinopril and metoprolol tartrate.  Complete heart block: S/p PPM placement in 1995, gen x2 in 2002 and 2009, lead revision in 2017. Device is MRI compatible however prior leads remain in place, per Dr. Erin Hearing notes patient should not undergo MRI. Remote check in 06/2023 showed pacemaker dependent, battery status was good, lead measurements stable.   Hypertension: Initial blood pressure today 102/60, on recheck was 108/60.  Patient denies dizziness, lightheadedness presyncope or syncope.  He will continue to monitor at home and notify the office if he develops symptoms or lower blood pressure.  Continue current antihypertensive  regimen.  Hyperlipidemia: Last lipid profile on 08/15/2023 indicated total cholesterol 122, HDL 36, triglycerides 169, LDL 57.  Carotid artery stenosis: Carotid duplex in 08/2023 R ICA consistent with 60-79% stenosis. Per Dr. Allyson Sabal there is significant RCA stenosis. Will obtain neck CTA to correlate, he will have a BMET in the week before testing.   PVD: Patient with history of claudication and PAD. In 08/2023 had unsuccessful PTA of left popliteal artery. Atherectomy, PTA, stent right poplitela artery in March 2015. Vascular ultrasound/ABI in 02/2022 showed moderate bilateral lower extremity arterial disease with moderate progression compared to prior study. Followed by Dr. Allyson Sabal. Continue Vascepa, aspirin, Plavix and rosuvastatin.   Disposition: Follow-up with Reather Littler, NP or Dr. [name] following echocardiogram.         Signed, Reather Littler, NP

## 2023-08-26 ENCOUNTER — Ambulatory Visit: Payer: Medicare Other | Admitting: Cardiovascular Disease

## 2023-08-27 ENCOUNTER — Encounter: Payer: Self-pay | Admitting: Cardiology

## 2023-08-27 ENCOUNTER — Ambulatory Visit: Payer: Medicare Other | Attending: Cardiology | Admitting: Cardiology

## 2023-08-27 VITALS — BP 108/60 | HR 65 | Ht 69.0 in | Wt 197.0 lb

## 2023-08-27 DIAGNOSIS — I10 Essential (primary) hypertension: Secondary | ICD-10-CM | POA: Diagnosis not present

## 2023-08-27 DIAGNOSIS — I442 Atrioventricular block, complete: Secondary | ICD-10-CM | POA: Insufficient documentation

## 2023-08-27 DIAGNOSIS — I255 Ischemic cardiomyopathy: Secondary | ICD-10-CM | POA: Insufficient documentation

## 2023-08-27 DIAGNOSIS — I25708 Atherosclerosis of coronary artery bypass graft(s), unspecified, with other forms of angina pectoris: Secondary | ICD-10-CM | POA: Insufficient documentation

## 2023-08-27 DIAGNOSIS — I6523 Occlusion and stenosis of bilateral carotid arteries: Secondary | ICD-10-CM | POA: Insufficient documentation

## 2023-08-27 DIAGNOSIS — I739 Peripheral vascular disease, unspecified: Secondary | ICD-10-CM | POA: Insufficient documentation

## 2023-08-27 DIAGNOSIS — R0602 Shortness of breath: Secondary | ICD-10-CM | POA: Insufficient documentation

## 2023-08-27 NOTE — Patient Instructions (Signed)
Medication Instructions:  No changes *If you need a refill on your cardiac medications before your next appointment, please call your pharmacy*  Lab Work: In a week prior to you Neck CTA we will need a Bmet If you have labs (blood work) drawn today and your tests are completely normal, you will receive your results only by: MyChart Message (if you have MyChart) OR A paper copy in the mail If you have any lab test that is abnormal or we need to change your treatment, we will call you to review the results.  Testing/Procedures:  We are going to need you to be schedule for a CT Angio of the Neck.  Your physician has requested that you have an echocardiogram. Echocardiography is a painless test that uses sound waves to create images of your heart. It provides your doctor with information about the size and shape of your heart and how well your heart's chambers and valves are working. This procedure takes approximately one hour. There are no restrictions for this procedure. Please do NOT wear cologne, perfume, aftershave, or lotions (deodorant is allowed). Please arrive 15 minutes prior to your appointment time.  Please note: We ask at that you not bring children with you during ultrasound (echo/ vascular) testing. Due to room size and safety concerns, children are not allowed in the ultrasound rooms during exams. Our front office staff cannot provide observation of children in our lobby area while testing is being conducted. An adult accompanying a patient to their appointment will only be allowed in the ultrasound room at the discretion of the ultrasound technician under special circumstances. We apologize for any inconvenience.  Follow-Up: At [facility], you and your health needs are our priority.  As part of our continuing mission to provide you with exceptional heart care, we have created designated Provider Care Teams.  These Care Teams include your primary Cardiologist (physician)  and Advanced Practice Providers (APPs -  Physician Assistants and Nurse Practitioners) who all work together to provide you with the care you need, when you need it.  We recommend signing up for the patient portal called "MyChart".  Sign up information is provided on this After Visit Summary.  MyChart is used to connect with patients for Virtual Visits (Telemedicine).  Patients are able to view lab/test results, encounter notes, upcoming appointments, etc.  Non-urgent messages can be sent to your provider as well.   To learn more about what you can do with MyChart, go to [email or web address].    Your next appointment:   After the Echo  Provider:   Thurmon Fair, MD  or Reather Littler, NP

## 2023-08-28 ENCOUNTER — Ambulatory Visit (HOSPITAL_COMMUNITY): Payer: Medicare Other | Attending: Internal Medicine

## 2023-08-28 ENCOUNTER — Other Ambulatory Visit (HOSPITAL_COMMUNITY): Payer: Medicare Other

## 2023-08-28 DIAGNOSIS — R0602 Shortness of breath: Secondary | ICD-10-CM | POA: Insufficient documentation

## 2023-08-28 LAB — ECHOCARDIOGRAM COMPLETE
Area-P 1/2: 3.89 cm2
S' Lateral: 4.8 cm

## 2023-08-28 MED ORDER — PERFLUTREN LIPID MICROSPHERE
1.0000 mL | INTRAVENOUS | Status: AC | PRN
  Administered 2023-08-28: 4 mL via INTRAVENOUS

## 2023-08-29 ENCOUNTER — Telehealth: Payer: Self-pay

## 2023-08-29 ENCOUNTER — Ambulatory Visit (HOSPITAL_COMMUNITY): Payer: Medicare Other

## 2023-08-29 DIAGNOSIS — I25708 Atherosclerosis of coronary artery bypass graft(s), unspecified, with other forms of angina pectoris: Secondary | ICD-10-CM

## 2023-08-29 DIAGNOSIS — I442 Atrioventricular block, complete: Secondary | ICD-10-CM

## 2023-08-29 DIAGNOSIS — I255 Ischemic cardiomyopathy: Secondary | ICD-10-CM

## 2023-08-29 MED ORDER — EMPAGLIFLOZIN 10 MG PO TABS: 10.0000 mg | ORAL_TABLET | Freq: Every day | ORAL | 3 refills | Status: DC

## 2023-08-29 NOTE — Telephone Encounter (Signed)
-----   Message from Rip Harbour sent at 08/29/2023  5:00 PM EST ----- Reviewed with Dr. [name]. Called patient to discuss results and recommendation to start Farxiga 10 mg daily. Patient to have repeat BMET in two weeks.

## 2023-08-29 NOTE — Telephone Encounter (Signed)
David [name] is no covered by patient insurance have to change over to [place].

## 2023-08-30 DIAGNOSIS — I255 Ischemic cardiomyopathy: Secondary | ICD-10-CM | POA: Diagnosis not present

## 2023-08-30 DIAGNOSIS — I442 Atrioventricular block, complete: Secondary | ICD-10-CM | POA: Diagnosis not present

## 2023-08-30 DIAGNOSIS — R0602 Shortness of breath: Secondary | ICD-10-CM | POA: Diagnosis not present

## 2023-08-30 DIAGNOSIS — I25708 Atherosclerosis of coronary artery bypass graft(s), unspecified, with other forms of angina pectoris: Secondary | ICD-10-CM | POA: Diagnosis not present

## 2023-08-30 DIAGNOSIS — I6523 Occlusion and stenosis of bilateral carotid arteries: Secondary | ICD-10-CM | POA: Diagnosis not present

## 2023-08-30 DIAGNOSIS — I739 Peripheral vascular disease, unspecified: Secondary | ICD-10-CM | POA: Diagnosis not present

## 2023-08-31 LAB — BASIC METABOLIC PANEL
BUN/Creatinine Ratio: 16 (ref 10–24)
BUN: 17 mg/dL (ref 8–27)
CO2: 25 mmol/L (ref 20–29)
Calcium: 9.4 mg/dL (ref 8.6–10.2)
Chloride: 102 mmol/L (ref 96–106)
Creatinine, Ser: 1.08 mg/dL (ref 0.76–1.27)
Glucose: 106 mg/dL — ABNORMAL HIGH (ref 70–99)
Potassium: 4.7 mmol/L (ref 3.5–5.2)
Sodium: 142 mmol/L (ref 134–144)
eGFR: 66 mL/min/{1.73_m2} (ref >59)

## 2023-09-06 ENCOUNTER — Ambulatory Visit (HOSPITAL_COMMUNITY)
Admission: RE | Admit: 2023-09-06 | Discharge: 2023-09-06 | Disposition: A | Discharge status: Home / Self Care | Payer: Medicare Other | Source: Ambulatory Visit | Attending: Cardiology

## 2023-09-06 DIAGNOSIS — I6523 Occlusion and stenosis of bilateral carotid arteries: Secondary | ICD-10-CM | POA: Diagnosis not present

## 2023-09-06 DIAGNOSIS — I6503 Occlusion and stenosis of bilateral vertebral arteries: Secondary | ICD-10-CM | POA: Diagnosis not present

## 2023-09-06 DIAGNOSIS — I700 Atherosclerosis of aorta: Secondary | ICD-10-CM | POA: Diagnosis not present

## 2023-09-06 DIAGNOSIS — I672 Cerebral atherosclerosis: Secondary | ICD-10-CM | POA: Diagnosis not present

## 2023-09-06 MED ORDER — IOHEXOL 350 MG/ML SOLN
75.0000 mL | Freq: Once | INTRAVENOUS | Status: AC | PRN
  Administered 2023-09-06: 75 mL via INTRAVENOUS

## 2023-09-06 MED ORDER — SODIUM CHLORIDE (PF) 0.9 % IJ SOLN
INTRAMUSCULAR | Status: AC
  Filled 2023-09-06: qty 50

## 2023-09-20 ENCOUNTER — Telehealth: Payer: Self-pay

## 2023-09-20 DIAGNOSIS — I25708 Atherosclerosis of coronary artery bypass graft(s), unspecified, with other forms of angina pectoris: Secondary | ICD-10-CM

## 2023-09-20 DIAGNOSIS — I771 Stricture of artery: Secondary | ICD-10-CM

## 2023-09-20 NOTE — Telephone Encounter (Signed)
-----   Message from Joylene Grapes sent at 09/20/2023  3:51 PM EST ----- Covering David [name]'s inbox. Recent CT of the neck shows severe narrowing of the right internal carotid artery. Would recommend referral to vascular surgery for ongoing management. Otherwise, continue current medications and follow-up as planned. Thank you -EM

## 2023-09-20 NOTE — Telephone Encounter (Signed)
Called patient advised of below they verbalized understanding Sent referral

## 2023-09-26 ENCOUNTER — Ambulatory Visit: Payer: Medicare Other | Admitting: Cardiology

## 2023-10-01 ENCOUNTER — Ambulatory Visit (INDEPENDENT_AMBULATORY_CARE_PROVIDER_SITE_OTHER): Payer: Medicare Other

## 2023-10-01 DIAGNOSIS — I255 Ischemic cardiomyopathy: Secondary | ICD-10-CM

## 2023-10-02 LAB — CUP PACEART REMOTE DEVICE CHECK
Battery Remaining Longevity: 60 mo
Battery Remaining Percentage: 58 %
Brady Statistic RA Percent Paced: 93 %
Brady Statistic RV Percent Paced: 99 %
Date Time Interrogation Session: 20250225022100
Implantable Lead Connection Status: 753985
Implantable Lead Connection Status: 753985
Implantable Lead Implant Date: 20170425
Implantable Lead Implant Date: 20170425
Implantable Lead Location: 753859
Implantable Lead Location: 753860
Implantable Lead Model: 7741
Implantable Lead Model: 7742
Implantable Lead Serial Number: 720495
Implantable Lead Serial Number: 763888
Implantable Pulse Generator Implant Date: 20170425
Lead Channel Impedance Value: 713 Ohm
Lead Channel Impedance Value: 758 Ohm
Lead Channel Pacing Threshold Amplitude: 0.9 V
Lead Channel Pacing Threshold Pulse Width: 0.4 ms
Lead Channel Setting Pacing Amplitude: 1.4 V
Lead Channel Setting Pacing Amplitude: 2.5 V
Lead Channel Setting Pacing Pulse Width: 0.4 ms
Lead Channel Setting Sensing Sensitivity: 2.5 mV
Pulse Gen Serial Number: 718025
Zone Setting Status: 755011

## 2023-10-06 NOTE — Progress Notes (Signed)
 Cardiology Clinic Note   Date: 10/11/2023 ID: GARI HARTSELL, DOB [birth year], MRN [id number]  Primary Cardiologist:  Thurmon Fair, MD  Patient Profile    David [name] is a 89 y.o. male who presents to the clinic today for follow ip for chest pain and shortness of breath.     Past medical history significant for: CAD. CABG x 5 09/10/1988: LIMA to LAD, SVG to PDA PL, sequential SVG to RI, OM1 and OM 2. LHC 04/09/2000 (angina): Severe native CAD.  Mid LAD 75%.  Mid RCA 100%.  Proximal LCx 100%.  Patent grafts. LHC 01/04/2006 (angina): Proximal 100%, proximal LCx 100%, mid RCA 100%.  Patent LIMA with mid to distal LAD occlusion beyond LIMA with retrograde collateral flow from RCA collaterals.  PCI with stent to SVG to PDA, stent to SVG to OM1/OM 2/RI. LHC 03/04/2013 (high risk nuclear stress test): Patent LIMA to LAD.  Patent SVG to PDA with occluded sequential limb to PLA.  SVG to OM1 and OM 2 with 90% mid in-stent restenosis and 90% stenosis distal to previously placed stent.  PCI with Cutting Balloon angioplasty and DES to SVG to OM1 and OM 2. LHC 03/05/2023 (abnormal PET stress): Patent LIMA to LAD.  SVG to RCA occluded.  Proximal graft lesion 100%.  Proximal graft lesion before OM1 20%.  OM graft in-stent lesion-mid graft lesion before OM1 90%.  PTCA of calcified lesion in prior stent (2014) and SVG to OM jump graft.  Unable to fully expand lesion. Ischemic cardiomyopathy. Echo 02/23/2021: EF 45 to 50%.  Apical akinesis.  Grade I DD.  Elevated left atrial pressure.  Normal RV function.  Trivial MR.  Mild to moderate aortic valve sclerosis/calcification without stenosis. Echo 08/28/2023 EF 45 to 50% LV with mildly decreased function, LV internal cavity mildly dilated, grade 2 diastolic dysfunction, RV systolic function and size was normal, normal pulmonary artery pressures.  No significant change from prior study Complete heart block. S/p PPM placement 1995. GEN change 2002 and 2009. Lead revision  2017. Remote check in 06/2023 showed pacemaker dependent, battery status was good, lead measurements stable.  Carotid artery disease. Carotid duplex 08/15/23: Carotid duplex in 08/2023 R ICA consistent with 60-79% stenosis. CT angio of the neck indicated severe stenosis and subtotal occlusion of the at the origin of the right internal carotid artery PVD. PV angiogram 08/31/2013: Unsuccessful PTA attempt CTO left popliteal artery. PV angiogram 10/19/2013: Successful atherectomy, PTA and stenting right popliteal artery. Vascular ultrasound lower extremity/ABI 02/15/2022: Moderate bilateral lower extremity arterial disease.  Right: Moderate progression compared to prior study.  Extensive calcifications throughout.  Distal SFA now 50 to 74%.  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  Patent popliteal artery stent without evidence of stenosis.  Left: Moderate progression compared to prior study.  Extensive calcifications throughout.  Distal SFA now 50 to 74%.  Known left popliteal artery to TPT stent occlusion.  Native TPT occlusion. Non-Hodgkin's lymphoma.     History of Present Illness    David [name] is followed by Dr. [name] and Dr. Allyson Sabal for the above outlined history. David [name] has extensive CAD with history of a CABG in 1990 and multiple revascularization procedures as well as mild ischemic cardiomyopathy.  Been followed by Dr. Gery Pray for history of revascularization procedures for bilateral lower extremities and asymptomatic stenosis of carotid arteries.  Has a history of complete heart block with PPM placement in 1995 followed by generator exchange and lead revision.  Office visit  in 02/2023 with Dr. [name] patient had complaints of slowly progressing angina.  He reported chest discomfort when making the bed or climbing a few stairs requiring rest or sublingual nitroglycerin.  He was requiring nitroglycerin approximately twice a week in addition to his antianginal therapy.  At that  time he was started on amlodipine and underwent PET/CT.  PET stress findings consistent with extensive ischemia, he underwent LHC that indicated occlusion of the native LAD, the major OM branch and the native right coronary artery. There was a patent LIMA to the distal LAD artery and the saphenous vein graft to the RCA was occluded. There was also 90% in-stent stenosis in the saphenous vein graft to the oblique marginal artery. Angioplasty was performed without placement of a new stent and the recommendation was made for 6 months of dual antiplatelet therapy with aspirin and clopidogrel.  Following the catheterization he protracted.  Of severe itching without obvious rashes, finally resolved with antihistamines and topical cream.  Had previously taken clopidogrel and has had numerous contrast based studies without pruritus like this.  He was seen in clinic on 06/03/2023 by Dr. [name].  At that time he reported that he had greatly improved.  He can any nitroglycerin.  He reported angina that only occurred with greater activity than usual.  He was continued on long-acting nitrates and metoprolol.  His biggest concern at the time was fatigue, patient noted numbness in both legs for long period of times, consistent with his known lumbar spine stenosis.  Lisinopril was decreased to 5 mg daily as his diastolic blood pressure was consistently low.  On 08/21/2023 patient reported that he had been having chest pain and shortness of breath occur when walking down the hallway or changing close, will last for a few minutes and resolve with rest.  He had not required any sublingual nitroglycerin, he reported the chest pain was not as intense as chest pain prior to catheterization.  Echocardiogram on 08/28/2023 indicated LVEF of 45 to 50%, LV internal cavity size mildly dilated, diastolic parameters were consistent with grade 2 diastolic dysfunction.  Reviewed with Dr. [name], felt that overall his echocardiogram was  unchanged compared to priors.  It was recommended that patient start on Jardiance.  Today he presents for follow up, he reports that he is doing very well. He denies any further chest pain and feels his breathing has improved since starting on Jardiance. He has been tolerating his new medication well, he denies any side effects.  His fatigue has been improving. He has been trying to slowly increase his activity.  He denies any lower extremity edema, palpitations, orthopnea or PND.  ROS: All other systems reviewed and are otherwise negative except as noted in History of Present Illness. EKGs/Labs Reviewed        10/10/2023: BUN 18; Creatinine, Ser 1.19; Potassium 4.9; Sodium 141   02/28/2023: Hemoglobin 12.7; WBC 5.7   08/15/2023: Hemoglobin 13.2, hematocrit 39.3, WBC 5.7, sodium 139, potassium 4.4, creatinine 1.23, EGFR 56, AST 23, ALT 22  Physical Exam    VS:  BP 120/66 (BP Location: Left Arm, Patient Position: Sitting, Cuff Size: Normal)   Pulse 60   Ht 5\' 9"  (1.753 m)   Wt 193 lb (87.5 kg)   SpO2 96%   BMI 28.50 kg/m  , BMI Body mass index is 28.5 kg/m.  GEN: Well nourished, well developed, in no acute distress. Neck: No JVD. Bilateral carotid bruits. Cardiac:  RRR. No murmurs. No rubs or gallops.  Respiratory:  Respirations regular and unlabored. Clear to auscultation without rales, wheezing or rhonchi. GI: Soft, nontender, nondistended. Extremities: Radials/DP/PT 2+ and equal bilaterally. No clubbing or cyanosis. No edema   Skin: Warm and dry, no rash. Neuro: Strength intact.  Assessment & Plan   CAD: Extensive history of CABG in 1990 and multiple revascularization procedures.  Patient underwent LHC in July 2024 with PTCA to SVG to OMG graft.  Today patient reports that he is doing very well overall, he denies any further chest pain and notes that his breathing is improving.  Patient's echocardiogram was overall unchanged compared to his prior, he has been tolerating Jardiance  well.  Again reviewed the need to monitor for side effects, patient denies any current problems.  Encourage patient to increase his activity as tolerated.  Reviewed ED precautions.  Continue aspirin 81 mg daily, Vascepa 2 g twice daily, Imdur 90 mg twice daily, lisinopril 5 mg daily, metoprolol tartrate 100 mg twice daily and Crestor 40 mg daily.  Ischemic cardiomyopathy/dyspnea on exertion: Echo in July 2022 indicated EF 45 to 50%, Grade I DD.  On nuclear Q GS EF was 36% on 01/18/2021.  PET stress on 02/27/2023 indicated rest EF 47%, stress EF 36%.  Echo on 08/28/2023 indicated LVEF of 45 to 50%, G2 DD.  Patient was started on Jardiance in January, notes he has been tolerating this well reports that his dyspnea has been improving and denies any further chest pain. Today he appears euvolemic and well compensated on exam.  He denies lower extremity edema, orthopnea or PND.  He reports his weights have been stable.   Reviewed ED precautions.  Continue Imdur, lisinopril, Jardiance and metoprolol tartrate. Check BMET.   Complete heart block: S/p PPM placement in 1995, gen x2 in 2002 and 2009, lead revision in 2017. Device is MRI compatible however prior leads remain in place, per Dr. Erin Hearing notes patient should not undergo MRI. Remote check on 10/01/2023 showed patient with complete heart block, pacemaker dependent, battery status was good, lead measurements stable.   Hypertension: Blood pressure today 120/66. Patient denies dizziness, lightheadedness presyncope or syncope.  He will continue to monitor at home and notify the office if he develops symptoms or lower blood pressure.  Continue current antihypertensive regimen.  Hyperlipidemia: Last lipid profile on 08/15/2023 indicated total cholesterol 122, HDL 36, triglycerides 169, LDL 57.  Continue current regimen.  Carotid artery stenosis: Carotid duplex in 08/2023 R ICA consistent with 60-79% stenosis. Per Dr. Allyson Sabal there is significant RCA stenosis. CT angio  of the neck indicated severe stenosis and subtotal occlusion of the at the origin of the right internal carotid artery, patient has been seen by vascular surgery, patient will not undergo surgery at this time and will continue to monitor.  PVD: Patient with history of claudication and PAD. In 08/2023 had unsuccessful PTA of left popliteal artery. Atherectomy, PTA, stent right poplitela artery in March 2015. Vascular ultrasound/ABI in 02/2022 showed moderate bilateral lower extremity arterial disease with moderate progression compared to prior study. Followed by Dr. Allyson Sabal. Continue Vascepa, aspirin, Plavix and rosuvastatin.   Disposition: Follow up with Dr. [name] in 12/2023 as planned.     Signed, Reather Littler, NP

## 2023-10-07 NOTE — Progress Notes (Unsigned)
 [facility] [place]  ASSESSMENT / PLAN: David [name] is a 89 y.o. male with asymptomatic right 80 - 99 % carotid artery stenosis.   Recommend:  Abstinence from all tobacco products. Blood glucose control with goal A1c < 7%. Blood pressure control with goal blood pressure < 140/90 mmHg. Lipid reduction therapy with goal LDL-C <100 mg/dL  Aspirin 81mg  PO QD.  Atorvastatin 40-80mg  PO QD (or other "high intensity" statin therapy).  The patient and I had a very good discussion about asymptomatic carotid artery stenosis.  I reviewed the general consensus that asymptomatic intervention may not be appropriate for elderly patients, even fairly fit ones like him.  He is personally not very interested in surgery, and would like to avoid it for the time being.  I think this is very reasonable.  I will see him again in 6 months with a carotid duplex.  CHIEF COMPLAINT: Carotid artery stenosis  HISTORY OF PRESENT ILLNESS: David [name] is a 89 y.o. male referred to clinic for evaluation of asymptomatic carotid artery stenosis.  The patient is a very pleasant elderly gentleman who appears younger than his stated age.  He has a fairly significant cardiac history having undergone coronary artery bypass grafting about 35 years ago.  He has ischemic cardiomyopathy which appears well compensated, complete heart block, intermittent claudication of the lower extremities, hypertension, hyperlipidemia.  He has been followed for the carotid artery stenosis by his cardiology team for some time.  He has thankfully never suffered a stroke or mini stroke.  We reviewed his CT angiogram in detail.  I reviewed the current signs regarding asymptomatic carotid artery stenosis and very elderly patients.  I reviewed the modest improvement in stroke risk delivered from asymptomatic intervention.  I counseled him that greatest benefit was seen in fairly young patients with severe carotid artery stenosis and good  life expectancy.  He and I agreed that asymptomatic intervention may be an appropriate for him given his advanced age and health challenges.  Past Medical History:  Diagnosis Date   Arthritis    "hands" (10/19/2013)   Bilateral carotid bruits 04/08/2020   Right greater than left   Bladder tumor    CAD (coronary artery disease)    cardiologist-  dr crouitoru   Carotid artery disease (HCC)    moderate pRICA 60-79%,  1-39% LICA , >50% RECA last duplex 08-05-2015   Cataract of both eyes    Complete heart block (HCC)    GERD (gastroesophageal reflux disease)    Glaucoma, both eyes    Hematuria    History of MI (myocardial infarction)    1989   Hyperlipidemia    Hypertension    Ischemic cardiomyopathy    ef 40-45%  without clinical heart failure per cardiology note 12-28-2015 (nuclear stress ef 43% from 07-28-2015)   Left ventricular dysfunction    Non Hodgkin's lymphoma (HCC) dx 06/2010---  oncologist-  dr Truett Perna---  in clinical remission since 2013   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 -- lymphadenopathy to neck, mediastinal , and abdominal--  chemotherapy completed 06/ 2011   NSVT (nonsustained ventricular tachycardia) [facility]) cardiologist-  dr crouitoru   Max rate 200 bpm, 28-beat, nearly 9-second episode recorded by pacemaker on November 15, asymptomatic    Pacemaker pacemaker dependant due to CHB   1st Pacemaker Placement 1998 w/ New generator  12-31-2000 ; 08-04-2008;  11-29-2015   PAD (peripheral artery disease) (HCC)    followed by  dr berry--- last duplex 08-05-2015  patents bilateral popliteal stents, bilateral SFA 30-49% stenosis   Peripheral vascular disease with claudication (HCC)    dx 2009 left popliteal occlusion with collaterals; 03- 2015  s/p  stenting right popliteal  by dr berry and left popliteal stenting by dr Pernell Dupre ([facility]) july 2015   S/P CABG x 5    09-10-1988   S/P drug eluting coronary stent placement 01/04/2006   01-04-2006---  DES to SVG to OM1, OM2, and IR, and SVG to PDA(x2 grafts)  and 03-04-2013  DES to SVR to OM   Wears glasses     Past Surgical History:  Procedure Laterality Date   ANAL FISSURE REPAIR  12-09-2000 and 1970's   CARDIAC CATHETERIZATION  04/09/2000   patent grafts   CARDIAC PACEMAKER PLACEMENT  11-15-1993   dr Laneta Simmers   CARDIOVASCULAR STRESS TEST  07-28-2015  dr crouitoru   Intermediate risk nuclear study w/ large, severe, predominantly fixed inferior and apical defect; minimal reversibility inferior lateral wall; findings consistent w/ large prior infarct and trivial peri-infarct ischemia;  ef 43% w/ inferior hypokinesis and apical akinesis; mild LVE   CORONARY ANGIOPLASTY WITH STENT PLACEMENT  01-04-2006  dr gamble   DES to pSVG to OM1, SVG to OM2 &  IR, and mSVG to RCA dPDA (x2 grafts );  pLAD , pLCFX and  mRCA occluded;  LIMA to LAD mid-distal patent with occlusion beyond LIMA with collateral flow;  ef 30%   CORONARY ARTERY BYPASS GRAFT  09/10/1988   dr Andrey Campanile   "CABG X5" (03/04/2013) LIMA to LAD,SVG to imtermediate OM1 OM II,SVG to PDA   CORONARY BALLOON ANGIOPLASTY N/A 03/05/2023   Procedure: CORONARY BALLOON ANGIOPLASTY;  Surgeon: Corky Crafts, MD;  Location[facility] INVASIVE CV LAB;  Service: Cardiovascular;  Laterality: N/A;   CYSTOSCOPY W/ RETROGRADES Bilateral 04/13/2016   Procedure: CYSTOSCOPY WITH RETROGRADE PYELOGRAM;  Surgeon: Sebastian Ache, MD;  Location[facility];  Service: Urology;  Laterality: Bilateral;   EP IMPLANTABLE DEVICE N/A 11/29/2015   Procedure: Pacemaker Implant;  Surgeon: Thurmon Fair, MD;  Location: [facility] INVASIVE CV LAB;  Service: Cardiovascular;  Laterality: N/A;  new generator and new atrial, vertricular leads;  [employer] Accolade   LEFT HEART CATH AND CORS/GRAFTS ANGIOGRAPHY N/A 03/05/2023   Procedure: LEFT HEART CATH AND CORS/GRAFTS ANGIOGRAPHY;  Surgeon: Corky Crafts, MD;  [facility] INVASIVE CV LAB;  Service: Cardiovascular;   Laterality: N/A;   LEFT HEART CATHETERIZATION WITH CORONARY/GRAFT ANGIOGRAM N/A 03/04/2013   Procedure: LEFT HEART CATHETERIZATION WITH Isabel Caprice;  Surgeon: Lennette Bihari, MD;  Location: [facility] CATH LAB;  Service: Cardiovascular;  Laterality: N/A;   LOWER EXTREMITY ANGIOGRAM N/A 08/31/2013   Procedure: LOWER EXTREMITY ANGIOGRAM;  Surgeon: Runell Gess, MD;  Location: [facility] CATH LAB;  Service: Cardiovascular;  Laterality: N/A;   PACEMAKER REPLACEMENT  12/31/2000;  08-04-2008   PERCUTANEOUS CORONARY STENT INTERVENTION (PCI-S)  03/04/2013   Procedure: PERCUTANEOUS CORONARY STENT INTERVENTION (PCI-S);  Surgeon: Lennette Bihari, MD;  Location: [facility] CATH LAB;  Service: Cardiovascular;;  DES to S-OM for in-stent restenosis   POPLITEAL ARTERY STENT Right 10-21-2013  dr berry   atherectomy, PTA , and IDEV stent x1   POPLITEAL ARTERY STENT Left 07/ 2015  dr Pernell Dupre at [facility]   angioplasty / stent x2   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   SUPERFICIAL LYMPH NODE BIOPSY / EXCISION Left 2011   cervical lymph node  TRANSURETHRAL RESECTION OF BLADDER TUMOR N/A 04/13/2016   Procedure: TRANSURETHRAL RESECTION OF BLADDER TUMOR (TURBT) with clot evacuation;  Surgeon: Sebastian Ache, MD;  Location: [facility];  Service: Urology;  Laterality: N/A;   VENOGRAM Left 11/29/2015   Procedure: Venogram;  Surgeon: Thurmon Fair, MD;  Location: [facility] INVASIVE CV LAB;  Service: Cardiovascular;  Laterality: Left;    Family History  Problem Relation Age of Onset   Diabetes Mother    Stroke Mother    Heart attack Father    Hypertension Sister    Hyperlipidemia Sister    Hypertension Sister    Hyperlipidemia Sister     Social History   Socioeconomic History   Marital status: Married    Spouse name: Not on file   Number of children: Not on file   Years of education: Not on file   Highest education level: Not on file  Occupational History   Not on file  Tobacco Use    Smoking status: Former    Current packs/day: 0.00    Average packs/day: 0.1 packs/day for 1 year (0.1 ttl pk-yrs)    Types: Cigarettes    Start date: 08/07/1955    Quit date: 08/06/1956    Years since quitting: 67.2   Smokeless tobacco: Never  Substance and Sexual Activity   Alcohol use: No   Drug use: No   Sexual activity: Never  Other Topics Concern   Not on file  Social History Narrative   Not on file   Social Drivers of Health   Financial Resource Strain: Not on file  Food Insecurity: Not on file  Transportation Needs: Not on file  Physical Activity: Not on file  Stress: Not on file  Social Connections: Not on file  Intimate Partner Violence: Not on file    Allergies  Allergen Reactions   Azithromycin Diarrhea   Lactose Intolerance (Gi) Diarrhea   Niacin And Related Diarrhea   Sulfa Antibiotics Rash    Current Outpatient Medications  Medication Sig Dispense Refill   aspirin EC 81 MG tablet Take 81 mg by mouth daily.     clopidogrel (PLAVIX) 75 MG tablet Take 1 tablet (75 mg total) by mouth daily. 90 tablet 3   empagliflozin (JARDIANCE) 10 MG TABS tablet Take 1 tablet (10 mg total) by mouth daily. 90 tablet 3   famotidine (PEPCID) 20 MG tablet Take 20 mg by mouth 2 (two) times daily.     icosapent Ethyl (VASCEPA) 1 g capsule TAKE TWO CAPSULES BY MOUTH TWICE DAILY 360 capsule 1   isosorbide mononitrate (IMDUR) 30 MG 24 hr tablet Take by mouth.     isosorbide mononitrate (IMDUR) 60 MG 24 hr tablet TAKE 1 TABLET BY MOUTH IN THE  MORNING AND AT BEDTIME 180 tablet 3   lisinopril (ZESTRIL) 5 MG tablet Take 1 tablet (5 mg total) by mouth daily. 90 tablet 3   metoprolol tartrate (LOPRESSOR) 100 MG tablet TAKE 1 TABLET BY MOUTH TWICE  DAILY 180 tablet 3   nitroGLYCERIN (NITROSTAT) 0.4 MG SL tablet Place 1 tablet (0.4 mg total) under the tongue every 5 (five) minutes as needed for chest pain (May take every 5 minutes as needed for chest pain. Do not take more than 3 doses. If  chest pain not resolved after 2-3 doses, call 911). 25 tablet 1   rosuvastatin (CRESTOR) 40 MG tablet TAKE 1 TABLET BY MOUTH AT  BEDTIME 90 tablet 3   triamcinolone cream (KENALOG) 0.1 % Apply 1 Application  topically as needed.     No current facility-administered medications for this visit.    PHYSICAL EXAM Vitals:   10/08/23 0937 10/08/23 0938  BP: (!) 144/79 (!) 146/78  Pulse: 68   Temp: 98.1 F (36.7 C)   TempSrc: Temporal   SpO2: 96%   Weight: 192 lb 8 oz (87.3 kg)   Height: 5\' 9"  (1.753 m)     Well-appearing elderly man in no acute distress Regular rate and rhythm Unlabored breathing Normal gait and station No cranial nerve abnormalities Moves all extremities freely  PERTINENT LABORATORY AND RADIOLOGIC DATA  Most recent CBC    Latest Ref Rng & Units 02/28/2023   11:33 AM 07/15/2021   11:16 AM 02/15/2020   11:11 AM  CBC  WBC 3.4 - 10.8 x10E3/uL 5.7  6.9  5.6   Hemoglobin 13.0 - 17.7 g/dL 54.0  98.1  19.1   Hematocrit 37.5 - 51.0 % 36.5  41.5  37.1   Platelets 150 - 450 x10E3/uL 134  197  152      Most recent CMP    Latest Ref Rng & Units 08/30/2023   11:23 AM 02/28/2023   11:33 AM 07/15/2021   11:16 AM  CMP  Glucose 70 - 99 mg/dL 478  89  295   BUN 8 - 27 mg/dL 17  19  18    Creatinine 0.76 - 1.27 mg/dL 6.21  3.08  6.57   Sodium 134 - 144 mmol/L 142  142  135   Potassium 3.5 - 5.2 mmol/L 4.7  4.6  4.6   Chloride 96 - 106 mmol/L 102  103  106   CO2 20 - 29 mmol/L 25  26  22    Calcium 8.6 - 10.2 mg/dL 9.4  9.1  8.8   Total Protein 6.5 - 8.1 g/dL   6.9   Total Bilirubin 0.3 - 1.2 mg/dL   0.9   Alkaline Phos 38 - 126 U/L   46   AST 15 - 41 U/L   30   ALT 0 - 44 U/L   34     Renal function CrCl cannot be calculated (Patient's most recent lab result is older than the maximum 21 days allowed.).  No results found for: "HGBA1C"  LDL Cholesterol  Date Value Ref Range Status  06/24/2014 47 0 - 99 mg/dL Final    Comment:      Total Cholesterol/HDL  Ratio:CHD Risk                        Coronary Heart Disease Risk Table                                        Men       Women          1/2 Average Risk              3.4        3.3              Average Risk              5.0        4.4           2X Average Risk              9.6        7.1  3X Average Risk             23.4       11.0 Use the calculated Patient Ratio above and the CHD Risk table  to determine the patient's CHD Risk. ATP III Classification (LDL):       < 100        mg/dL         Optimal      161 - 129     mg/dL         Near or Above Optimal      130 - 159     mg/dL         Borderline High      160 - 189     mg/dL         High       > 096        mg/dL         Very High       CT angiogram of the neck 09/06/2023.  Personally reviewed in detail Right carotid artery bifurcation as significant stenosis (likely greater than 80%) with bulky calcification.  The lesion is very focal and would be fairly straightforward to treat with carotid endarterectomy.  I do not think a TCAR would be reasonable given the calcification.  Rande Brunt. Lenell Antu, MD [facility] [place] Phone Number: [phone] 10/08/2023 12:14 PM   Total time spent on preparing this encounter including chart review, data review, collecting history, examining the patient, coordinating care for this new patient, 60 minutes.  Portions of this report may have been transcribed using voice recognition software.  Every effort has been made to ensure accuracy; however, inadvertent computerized transcription errors may still be present.

## 2023-10-08 ENCOUNTER — Encounter: Payer: Self-pay | Admitting: Vascular Surgery

## 2023-10-08 ENCOUNTER — Ambulatory Visit (INDEPENDENT_AMBULATORY_CARE_PROVIDER_SITE_OTHER): Payer: Medicare Other | Admitting: Vascular Surgery

## 2023-10-08 ENCOUNTER — Encounter: Payer: Self-pay | Admitting: Cardiovascular Disease

## 2023-10-08 VITALS — BP 146/78 | HR 68 | Temp 98.1°F | Ht 69.0 in | Wt 192.5 lb

## 2023-10-08 DIAGNOSIS — I6521 Occlusion and stenosis of right carotid artery: Secondary | ICD-10-CM

## 2023-10-10 ENCOUNTER — Other Ambulatory Visit: Payer: Self-pay

## 2023-10-10 ENCOUNTER — Ambulatory Visit: Payer: Medicare Other | Attending: Cardiology | Admitting: Cardiology

## 2023-10-10 ENCOUNTER — Encounter: Payer: Self-pay | Admitting: Cardiology

## 2023-10-10 VITALS — BP 120/66 | HR 60 | Ht 69.0 in | Wt 193.0 lb

## 2023-10-10 DIAGNOSIS — I442 Atrioventricular block, complete: Secondary | ICD-10-CM | POA: Diagnosis not present

## 2023-10-10 DIAGNOSIS — I255 Ischemic cardiomyopathy: Secondary | ICD-10-CM

## 2023-10-10 DIAGNOSIS — I739 Peripheral vascular disease, unspecified: Secondary | ICD-10-CM | POA: Diagnosis not present

## 2023-10-10 DIAGNOSIS — I6523 Occlusion and stenosis of bilateral carotid arteries: Secondary | ICD-10-CM | POA: Insufficient documentation

## 2023-10-10 DIAGNOSIS — R0602 Shortness of breath: Secondary | ICD-10-CM | POA: Diagnosis not present

## 2023-10-10 DIAGNOSIS — I10 Essential (primary) hypertension: Secondary | ICD-10-CM | POA: Insufficient documentation

## 2023-10-10 DIAGNOSIS — I25708 Atherosclerosis of coronary artery bypass graft(s), unspecified, with other forms of angina pectoris: Secondary | ICD-10-CM | POA: Insufficient documentation

## 2023-10-10 DIAGNOSIS — E785 Hyperlipidemia, unspecified: Secondary | ICD-10-CM | POA: Diagnosis not present

## 2023-10-10 LAB — BASIC METABOLIC PANEL
BUN/Creatinine Ratio: 15 (ref 10–24)
BUN: 18 mg/dL (ref 8–27)
CO2: 25 mmol/L (ref 20–29)
Calcium: 9.4 mg/dL (ref 8.6–10.2)
Chloride: 103 mmol/L (ref 96–106)
Creatinine, Ser: 1.19 mg/dL (ref 0.76–1.27)
Glucose: 103 mg/dL — ABNORMAL HIGH (ref 70–99)
Potassium: 4.9 mmol/L (ref 3.5–5.2)
Sodium: 141 mmol/L (ref 134–144)
eGFR: 59 mL/min/{1.73_m2} — ABNORMAL LOW (ref >59)

## 2023-10-10 MED ORDER — ISOSORBIDE MONONITRATE ER 30 MG PO TB24: 90.0000 mg | ORAL_TABLET | Freq: Two times a day (BID) | ORAL | 3 refills | Status: DC

## 2023-10-10 NOTE — Patient Instructions (Signed)
 Medication Instructions:  Your medication has been sent to your pharmacy. You are to take Imdur 90mg  (three (3) of the 30mg  tablets 2 times daily.)  *If you need a refill on your cardiac medications before your next appointment, please call your pharmacy*   Lab Work: Lab work will be drawn today.  If you have labs (blood work) drawn today and your tests are completely normal, you will receive your results only by: MyChart Message (if you have MyChart) OR A paper copy in the mail If you have any lab test that is abnormal or we need to change your treatment, we will call you to review the results.   Testing/Procedures: No procedures were ordered during today's visit.    Follow-Up: At [facility], you and your health needs are our priority.  As part of our continuing mission to provide you with exceptional heart care, we have created designated Provider Care Teams.  These Care Teams include your primary Cardiologist (physician) and Advanced Practice Providers (APPs -  Physician Assistants and Nurse Practitioners) who all work together to provide you with the care you need, when you need it.  We recommend signing up for the patient portal called "MyChart".  Sign up information is provided on this After Visit Summary.  MyChart is used to connect with patients for Virtual Visits (Telemedicine).  Patients are able to view lab/test results, encounter notes, upcoming appointments, etc.  Non-urgent messages can be sent to your provider as well.   To learn more about what you can do with MyChart, go to [email or web address].    Your next appointment:   keep scheduled appointment  Provider:   Thurmon Fair, MD            1st Floor: - Lobby - Registration  - Pharmacy  - Lab - Cafe   2nd Floor: - PV Lab - Diagnostic Testing (echo, CT, nuclear med)   3rd Floor: - Vacant   4th Floor: - TCTS (cardiothoracic surgery) - AFib Clinic - Structural Heart Clinic - Vascular  Surgery  - Vascular Ultrasound   5th Floor: - [facility] (general and EP) - Clinical Pharmacy for coumadin, hypertension, lipid, weight-loss medications, and med management appointments      Valet parking services will be available as well.      Thank you for choosing [facility]!

## 2023-10-11 ENCOUNTER — Encounter: Payer: Self-pay | Admitting: Cardiology

## 2023-10-17 ENCOUNTER — Other Ambulatory Visit: Payer: Self-pay | Admitting: *Deleted

## 2023-10-17 DIAGNOSIS — I6521 Occlusion and stenosis of right carotid artery: Secondary | ICD-10-CM

## 2023-10-31 MED ORDER — LISINOPRIL 5 MG PO TABS: 5.0000 mg | ORAL_TABLET | Freq: Every day | ORAL | 3 refills | Status: DC

## 2023-10-31 MED ORDER — EMPAGLIFLOZIN 10 MG PO TABS: 10.0000 mg | ORAL_TABLET | Freq: Every day | ORAL | 3 refills | Status: DC

## 2023-11-08 NOTE — Addendum Note (Signed)
 Addended by: Elease Etienne A on: 11/08/2023 08:50 AM   Modules accepted: Orders

## 2023-11-08 NOTE — Progress Notes (Signed)
 Remote pacemaker transmission.

## 2023-12-12 ENCOUNTER — Other Ambulatory Visit: Payer: Self-pay | Admitting: Cardiovascular Disease

## 2023-12-12 ENCOUNTER — Ambulatory Visit: Payer: Medicare Other | Attending: Cardiovascular Disease | Admitting: Cardiovascular Disease

## 2023-12-12 VITALS — BP 122/70 | HR 60 | Ht 69.0 in | Wt 191.0 lb

## 2023-12-12 DIAGNOSIS — Z950 Presence of cardiac pacemaker: Secondary | ICD-10-CM | POA: Insufficient documentation

## 2023-12-12 DIAGNOSIS — I6521 Occlusion and stenosis of right carotid artery: Secondary | ICD-10-CM | POA: Insufficient documentation

## 2023-12-12 DIAGNOSIS — Z8572 Personal history of non-Hodgkin lymphomas: Secondary | ICD-10-CM | POA: Insufficient documentation

## 2023-12-12 DIAGNOSIS — I4729 Other ventricular tachycardia: Secondary | ICD-10-CM | POA: Insufficient documentation

## 2023-12-12 DIAGNOSIS — I10 Essential (primary) hypertension: Secondary | ICD-10-CM | POA: Insufficient documentation

## 2023-12-12 DIAGNOSIS — E785 Hyperlipidemia, unspecified: Secondary | ICD-10-CM | POA: Insufficient documentation

## 2023-12-12 DIAGNOSIS — I739 Peripheral vascular disease, unspecified: Secondary | ICD-10-CM | POA: Diagnosis not present

## 2023-12-12 DIAGNOSIS — I442 Atrioventricular block, complete: Secondary | ICD-10-CM | POA: Diagnosis not present

## 2023-12-12 DIAGNOSIS — I25718 Atherosclerosis of autologous vein coronary artery bypass graft(s) with other forms of angina pectoris: Secondary | ICD-10-CM | POA: Insufficient documentation

## 2023-12-12 DIAGNOSIS — I5022 Chronic systolic (congestive) heart failure: Secondary | ICD-10-CM | POA: Insufficient documentation

## 2023-12-12 MED ORDER — SACUBITRIL-VALSARTAN 24-26 MG PO TABS: 1.0000 | ORAL_TABLET | Freq: Two times a day (BID) | ORAL | 1 refills | Status: DC

## 2023-12-12 MED ORDER — METOPROLOL SUCCINATE ER 200 MG PO TB24: 200.0000 mg | ORAL_TABLET | Freq: Every day | ORAL | 1 refills | Status: DC

## 2023-12-12 NOTE — Patient Instructions (Signed)
 Medication Instructions:  Stop Lisinopril - WAIT 48 HOURS, THEN- START Entresto 24-26 mg Twice daily  STOP Metoprolol  Tartrate- START- Metoprolol  Succinate 200 mg once a day  *If you need a refill on your cardiac medications before your next appointment, please call your pharmacy*  Follow-Up: At [facility], you and your health needs are our priority.  As part of our continuing mission to provide you with exceptional heart care, our providers are all part of one team.  This team includes your primary Cardiologist (physician) and Advanced Practice Providers or APPs (Physician Assistants and Nurse Practitioners) who all work together to provide you with the care you need, when you need it.  Your next appointment:   Katlyn West- 2-3 weeks (if not available- any APP is fine)  3-4 months with Dr Alvis Ba  We recommend signing up for the patient portal called "MyChart".  Sign up information is provided on this After Visit Summary.  MyChart is used to connect with patients for Virtual Visits (Telemedicine).  Patients are able to view lab/test results, encounter notes, upcoming appointments, etc.  Non-urgent messages can be sent to your provider as well.   To learn more about what you can do with MyChart, go to [email or web address].

## 2023-12-12 NOTE — Progress Notes (Signed)
 Patient ID: David [name], male   DOB: [birth year], 89 y.o.   MRN: [id number]      Cardiology Office Note    Date:  12/13/2023   ID:  David [name], DOB 1933/03/09, MRN [id number]  PCP:  Glena Landau, MD  Cardiologist:  Lauro Portal, MD (PAD);  Luana Rumple, MD   Chief Complaint  Patient presents with   Coronary Artery Disease   Congestive Heart Failure   Shortness of Breath     History of Present Illness:  [name] is a 89 y.o. male David [name] is a 89 y.o. male with complete heart block, coronary artery disease, PAD of the lower extremities (has occluded popliteal stents in the left lower extremity), symptomatic PVCs (and one episode of lengthy nonsustained VT detected by his pacemaker), CHF with mildly reduced LVEF.  David [name] has recently developed symptoms of congestive heart failure as well as exertional angina.  We started treatment with Jardiance  about 6 weeks ago and interestingly his exertional chest pain has completely resolved, but he still has substantial problems with dyspnea on exertion, NYHA functional class III (even shampooing his hair with his arms elevated makes him short of breath, when he climbs the stairs he often has to sit down on the top step to rest).  He has not developed any lower extremity edema and does not have any shortness of breath at rest.  No orthopnea, no PND.  He has not been troubled by palpitations, dizziness or syncope.  His most recent echocardiogram from January 2025 shows apical akinesis with an overall LVEF of 45-50%, pseudo normal mitral inflow.  No major valvular problems were seen.  Cardiac catheterization on 03/05/2023 showed occlusion of the native LAD, the major OM branch and the native right coronary artery.  There was a patent LIMA to the distal LAD artery and the saphenous vein graft to the RCA was occluded.  There was also 90% in-stent stenosis in the saphenous vein graft to the oblique marginal artery.  Angioplasty was performed  without placement of a new stent and the recommendation was made for 6 months of dual antiplatelet therapy with aspirin  and clopidogrel .  After the catheterization he had a protracted period of severe itching without obvious rashes, that has finally resolved with antihistamines and a topical cream.  Note that he had taken clopidogrel  before and has had numerous contrast based studies without pruritus like this.  Heart failure medication doses have been limited by his blood pressure.  His diastolic blood pressure tends to be rather low.  His pacemaker is functioning normally, but his heart rate histogram seems to be very blunted.  Estimated generator longevity is 4.5 years.  He has 94% atrial pacing and 99% ventricular pacing.  He has not had any atrial fibrillation or ventricular tachycardia.  Presenting rhythm is AV sequential pacing.  We made the minute ventilation sensor more aggressive today from 10 increased to 14. He is pacemaker dependent due to complete AV block. He has a Radiation protection practitioner dual-chamber pacemaker that was implanted as new system with a new generator and leads in 2017, when his old device with unipolar leads reached end of service.  The old leads were left in place so even though his system is MRI compatible, he should not have an MRI due to abandoned leads.    Metabolic control is good with an LDL cholesterol of 57, albeit with a low HDL of 36.  Renal function is minimally abnormal, most recent  creatinine 1.19.  He is not anemic, his hemoglobin is 13.2.   [employer] Accolade EL DR model number [id number], serial number [id number] Right atrial lead (new) [employer] MRI, model number 7741, serial number [id number] Right ventricular lead (new)  [employer] MRI, model number Q3701389, serial number [id number]. Old unipolar leads are still in place so that he is not MRI conditional despite the fact that the individual components of his new system are MRI  conditional.  [name] has had numerous coronary revascularization procedures, most recently a stent in the saphenous vein graft to the oblique marginal artery in July 2014. He is now almost 20 years status post coronary bypass surgery. The same bypass graft had received a stent in 2007. He has mild ischemic cardiomyopathy with an ejection fraction of 40-45%, without clinical  heart failure. The nuclear stress test on July 28, 2015 shows old scar in the apex and inferior wall. LVEF is calculated at 43% (identical to 2014 and 2015).   He had occlusion of the left popliteal artery with distal reconstitution via collaterals. He had successful recanalization with placement of 2 stents by Dr. Welton Hall in 2015 . This led to marked improvement in his claudication, but the stents subsequently occluded.  Claudication improved with cilostazol . He is pacemaker dependent secondary to complete heart block. He had a generator changeout in 2017. Interrogation of his device today again shows some issues with "noise" on the atrial channel but none on the ventricular channel.  Moderate carotid artery disease (stable 60-79% proximal internal carotid artery stenosis on the right, mild plaque on the left), last duplex US  was February 11, 2015.   He has a history of non-Hodgkin's lymphoma with a large neck mass but also mediastinal and abdominal lymphadenopathy treated with radiotherapy to the cervical area in 2011 followed by chemotherapy with Rituxan and fludarabine. He is considered to be in remission.   He has undergone cystoscopic resection of a bladder tumor and bilateral cataract surgery.    Past Medical History:  Diagnosis Date   Arthritis    "hands" (10/19/2013)   Bilateral carotid bruits 04/08/2020   Right greater than left   Bladder tumor    CAD (coronary artery disease)    cardiologist-  dr crouitoru   Carotid artery disease (HCC)    moderate pRICA 60-79%,  1-39% LICA , >50% RECA last duplex 08-05-2015    Cataract of both eyes    Complete heart block (HCC)    GERD (gastroesophageal reflux disease)    Glaucoma, both eyes    Hematuria    History of MI (myocardial infarction)    1989   Hyperlipidemia    Hypertension    Ischemic cardiomyopathy    ef 40-45%  without clinical heart failure per cardiology note 12-28-2015 (nuclear stress ef 43% from 07-28-2015)   Left ventricular dysfunction    Non Hodgkin's lymphoma (HCC) dx 06/2010---  oncologist-  dr Scherrie Curt---  in clinical remission since 2013   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 -- lymphadenopathy to neck, mediastinal , and abdominal--  chemotherapy completed 06/ 2011   NSVT (nonsustained ventricular tachycardia) [facility]) cardiologist-  dr crouitoru   Max rate 200 bpm, 28-beat, nearly 9-second episode recorded by pacemaker on November 15, asymptomatic    Pacemaker pacemaker dependant due to CHB   1st Pacemaker Placement 1998 w/ New generator  12-31-2000 ; 08-04-2008;  11-29-2015   PAD (peripheral artery disease) (HCC)    followed by dr  berry--- last duplex 08-05-2015  patents bilateral popliteal stents, bilateral SFA 30-49% stenosis   Peripheral vascular disease with claudication (HCC)    dx 2009 left popliteal occlusion with collaterals; 03- 2015  s/p  stenting right popliteal  by dr berry and left popliteal stenting by dr Welton Hall ([facility]) july 2015   S/P CABG x 5    09-10-1988   S/P drug eluting coronary stent placement 01/04/2006   01-04-2006--- DES to SVG to OM1, OM2, and IR, and SVG to PDA(x2 grafts)  and 03-04-2013  DES to SVR to OM   Wears glasses     Past Surgical History:  Procedure Laterality Date   ANAL FISSURE REPAIR  12-09-2000 and 1970's   CARDIAC CATHETERIZATION  04/09/2000   patent grafts   CARDIAC PACEMAKER PLACEMENT  11-15-1993   dr Sherene Dilling   CARDIOVASCULAR STRESS TEST  07-28-2015  dr crouitoru   Intermediate risk nuclear study w/ large, severe, predominantly fixed inferior and apical  defect; minimal reversibility inferior lateral wall; findings consistent w/ large prior infarct and trivial peri-infarct ischemia;  ef 43% w/ inferior hypokinesis and apical akinesis; mild LVE   CORONARY ANGIOPLASTY WITH STENT PLACEMENT  01-04-2006  dr gamble   DES to pSVG to OM1, SVG to OM2 &  IR, and mSVG to RCA dPDA (x2 grafts );  pLAD , pLCFX and  mRCA occluded;  LIMA to LAD mid-distal patent with occlusion beyond LIMA with collateral flow;  ef 30%   CORONARY ARTERY BYPASS GRAFT  09/10/1988   dr Elvan Hamel   "CABG X5" (03/04/2013) LIMA to LAD,SVG to imtermediate OM1 OM II,SVG to PDA   CORONARY BALLOON ANGIOPLASTY N/A 03/05/2023   Procedure: CORONARY BALLOON ANGIOPLASTY;  Surgeon: Lucendia Rusk, MD;  Location[facility] INVASIVE CV LAB;  Service: Cardiovascular;  Laterality: N/A;   CYSTOSCOPY W/ RETROGRADES Bilateral 04/13/2016   Procedure: CYSTOSCOPY WITH RETROGRADE PYELOGRAM;  Surgeon: Osborn Blaze, MD;  Location: [facility];  Service: Urology;  Laterality: Bilateral;   EP IMPLANTABLE DEVICE N/A 11/29/2015   Procedure: Pacemaker Implant;  Surgeon: Luana Rumple, MD;  Location: [facility] INVASIVE CV LAB;  Service: Cardiovascular;  Laterality: N/A;  new generator and new atrial, vertricular leads;  [employer] Accolade   LEFT HEART CATH AND CORS/GRAFTS ANGIOGRAPHY N/A 03/05/2023   Procedure: LEFT HEART CATH AND CORS/GRAFTS ANGIOGRAPHY;  Surgeon: Lucendia Rusk, MD;  Location: [facility] INVASIVE CV LAB;  Service: Cardiovascular;  Laterality: N/A;   LEFT HEART CATHETERIZATION WITH CORONARY/GRAFT ANGIOGRAM N/A 03/04/2013   Procedure: LEFT HEART CATHETERIZATION WITH Estella Helling;  Surgeon: Millicent Ally, MD;  Location: [facility] CATH LAB;  Service: Cardiovascular;  Laterality: N/A;   LOWER EXTREMITY ANGIOGRAM N/A 08/31/2013   Procedure: LOWER EXTREMITY ANGIOGRAM;  Surgeon: Avanell Leigh, MD;  Location: [facility] CATH LAB;  Service: Cardiovascular;  Laterality: N/A;   PACEMAKER REPLACEMENT   12/31/2000;  08-04-2008   PERCUTANEOUS CORONARY STENT INTERVENTION (PCI-S)  03/04/2013   Procedure: PERCUTANEOUS CORONARY STENT INTERVENTION (PCI-S);  Surgeon: Millicent Ally, MD;  Location: [facility] CATH LAB;  Service: Cardiovascular;;  DES to S-OM for in-stent restenosis   POPLITEAL ARTERY STENT Right 10-21-2013  dr berry   atherectomy, PTA , and IDEV stent x1   POPLITEAL ARTERY STENT Left 07/ 2015  dr Welton Hall at [facility]   angioplasty / stent x2   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   SUPERFICIAL LYMPH NODE BIOPSY / EXCISION Left 2011   cervical lymph node   TRANSURETHRAL  RESECTION OF BLADDER TUMOR N/A 04/13/2016   Procedure: TRANSURETHRAL RESECTION OF BLADDER TUMOR (TURBT) with clot evacuation;  Surgeon: Osborn Blaze, MD;  Location: [facility];  Service: Urology;  Laterality: N/A;   VENOGRAM Left 11/29/2015   Procedure: Venogram;  Surgeon: Luana Rumple, MD;  Location: [facility] INVASIVE CV LAB;  Service: Cardiovascular;  Laterality: Left;    Outpatient Medications Prior to Visit  Medication Sig Dispense Refill   aspirin  EC 81 MG tablet Take 81 mg by mouth daily.     empagliflozin  (JARDIANCE ) 10 MG TABS tablet Take 1 tablet (10 mg total) by mouth daily. 90 tablet 3   icosapent  Ethyl (VASCEPA ) 1 g capsule TAKE TWO CAPSULES BY MOUTH TWICE DAILY 360 capsule 1   isosorbide  mononitrate (IMDUR ) 30 MG 24 hr tablet Take 3 tablets (90 mg total) by mouth 2 (two) times daily. 180 tablet 3   rosuvastatin  (CRESTOR ) 40 MG tablet TAKE 1 TABLET BY MOUTH AT  BEDTIME 90 tablet 3   triamcinolone  cream (KENALOG ) 0.1 % Apply 1 Application topically as needed.     lisinopril  (ZESTRIL ) 5 MG tablet Take 1 tablet (5 mg total) by mouth daily. 90 tablet 3   metoprolol  tartrate (LOPRESSOR ) 100 MG tablet TAKE 1 TABLET BY MOUTH TWICE  DAILY 180 tablet 3   clopidogrel  (PLAVIX ) 75 MG tablet Take 1 tablet (75 mg total) by mouth daily. (Patient not taking: Reported on 10/10/2023) 90 tablet 3    famotidine  (PEPCID ) 20 MG tablet Take 20 mg by mouth 2 (two) times daily. (Patient not taking: Reported on 10/10/2023)     nitroGLYCERIN  (NITROSTAT ) 0.4 MG SL tablet Place 1 tablet (0.4 mg total) under the tongue every 5 (five) minutes as needed for chest pain (May take every 5 minutes as needed for chest pain. Do not take more than 3 doses. If chest pain not resolved after 2-3 doses, call 911). (Patient not taking: Reported on 12/12/2023) 25 tablet 1   No facility-administered medications prior to visit.     Allergies:   Azithromycin, Lactose intolerance (gi), Niacin and related, and Sulfa antibiotics     Family History:  The patient's family history includes Diabetes in his mother; Heart attack in his father; Hyperlipidemia in his sister and sister; Hypertension in his sister and sister; Stroke in his mother.     PHYSICAL EXAM:   VS:  BP 122/70   Pulse 60   Ht 5\' 9"  (1.753 m)   Wt 86.6 kg   SpO2 96%   BMI 28.21 kg/m      General: Alert, oriented x3, no distress, healthy pacemaker site Head: no evidence of trauma, PERRL, EOMI, no exophtalmos or lid lag, no myxedema, no xanthelasma; normal ears, nose and oropharynx Neck: normal jugular venous pulsations and no hepatojugular reflux; brisk carotid pulses without delay and no carotid bruits Chest: clear to auscultation, no signs of consolidation by percussion or palpation, normal fremitus, symmetrical and full respiratory excursions Cardiovascular: normal position and quality of the apical impulse, regular rhythm, normal first and second heart sounds, no murmurs, rubs or gallops Abdomen: no tenderness or distention, no masses by palpation, no abnormal pulsatility or arterial bruits, normal bowel sounds, no hepatosplenomegaly Extremities: no clubbing, cyanosis or edema; 2+ radial, ulnar and brachial pulses bilaterally; 2+ right femoral, posterior tibial and dorsalis pedis pulses; 2+ left femoral, posterior tibial and dorsalis pedis pulses; no  subclavian or femoral bruits Neurological: grossly nonfocal Psych: Normal mood and affect    Wt Readings from Last  3 Encounters:  12/12/23 86.6 kg  10/10/23 87.5 kg  10/08/23 87.3 kg    Studies/Labs Reviewed:  Pharmacological nuclear stress test 01/18/2021 The left ventricular ejection fraction is moderately decreased (30-44%). Nuclear stress EF: 36%. There was no ST segment deviation noted during stress. Defect 1: There is a medium defect of severe severity present in the basal inferior, mid inferior, apical inferior, apical lateral and apex location. Findings consistent with prior myocardial infarction. This is an intermediate risk study due to reduced systolic function. There is no ischemia.   EKG: Personally reviewed the most recent tracing from 08/27/2023 shows AV sequential pacing with distinct positive R waves in leads V1 and V2  EKG Interpretation Date/Time:    Ventricular Rate:    PR Interval:    QRS Duration:    QT Interval:    QTC Calculation:   R Axis:      Text Interpretation:          Recent Labs: 02/28/2023: Hemoglobin 12.7; Platelets 134 10/10/2023: BUN 18; Creatinine, Ser 1.19; Potassium 4.9; Sodium 141   Lipid Panel  07/15/2020 Cholesterol 132, HDL 41, LDL 67, triglycerides 136, creatinine 1.38, potassium 4.5, normal liver function tests  08/09/2022 Cholesterol 142, HDL 46, LDL 66, triglycerides 178 Hemoglobin 14.1, creatinine 1.19, potassium 4.3, ALT 30, platelets 197 K  08/15/2023 Cholesterol 122, HDL 36, LDL 57, triglycerides 169 Hemoglobin 13.2, ALT 22  10/10/2023 Creatinine 1.19, potassium 4.9     Component Value Date/Time   CHOL 119 06/24/2014 0855   TRIG 185 (H) 06/24/2014 0855   HDL 35 (L) 06/24/2014 0855   CHOLHDL 3.4 06/24/2014 0855   VLDL 37 06/24/2014 0855   LDLCALC 47 06/24/2014 0855   ASSESSMENT:    1. Coronary artery disease of autologous vein bypass graft with stable angina pectoris (HCC)   2. CHB (complete heart block)  (HCC)   3. Pacemaker   4. Chronic systolic heart failure (HCC)   5. PAD (peripheral artery disease) (HCC)   6. NSVT (nonsustained ventricular tachycardia) (HCC)   7. Essential hypertension   8. Asymptomatic stenosis of right carotid artery without infarction   9. Dyslipidemia   10. History of lymphoma           PLAN:  In order of problems listed above:  CAD (CABG 1995, PCI SVG-OM 2007 and 2014): It is possible that he has once again developed restenosis in the SVG-OM stent, but he is currently not experiencing angina pectoris.  Also likely that he may still have some ischemic myocardium in the distribution of the right coronary artery, although perfusion studies have suggested that this is mostly scar.  No option for percutaneous revascularization of the inferior wall.  Will continue with medical management for now. CHB: No escape rhythm, pacemaker dependent. Pacemaker: His heart rate histograms remain very blunted so made the mode of an initial and sensor even more aggressive, up to 14.  Otherwise, normal device function.  Continue remote downloads every 3 months.  He has abandoned unipolar leads so he cannot have an MRI even though his current system is MRI conditional. CHF/Ischemic cardiomyopathy: Despite his depressed LVEF (45-50% by echo January 2025; 36% by nuclear QGS 01/18/2021) for many years he did not have any symptoms of heart failure until recently.  We started Jardiance  a couple of months ago and this has led to resolution of his chest pain, but he still has fairly severe exertional dyspnea.  Will try to switch from lisinopril  to Entresto , but his blood  pressure may be a limitation.  Clinically he has no signs of hypervolemia.  Decrease of EF is partly due to RV pacing induced dyssynchrony.  Switch to metoprolol  succinate and consider reducing the dose if he develops hypotension with Entresto . PAD: Currently does not have any intermittent claudication.  Although he likely has  some degree of vascular insufficiency, current complaints are more consistent with neurogenic claudication from spinal stenosis (he has pain in his legs just standing at the kitchen sink).  02/15/2021 ABI actually shows some improvement (right 0.90, left 1.11). PVCs/NSVT: In the past used to have relatively frequent nonsustained VT, but this has not been present in quite a while.  None on the current pacemaker download.  Relatively low burden of PVCs now 3%, historically around 4-5%.   HTN: Very well-controlled.  Hopefully he will be able to tolerate Entresto . Asymptomatic stenosis of the right carotid artery: He had a carotid Doppler done on 08/15/2023 that showed no change in the 60-79% right internal carotid artery stenosis but also suggested that hemodynamically significant obstruction at the level of the right common carotid.  There was only mild plaque on the left without significant obstruction. HLP: LDL is well within target range.  Borderline low HDL despite the fact that he is very lean. Lymphoma in remission  Medication Adjustments/Labs and Tests Ordered: Current medicines are reviewed at length with the patient today.  Concerns regarding medicines are outlined above.  Medication changes, Labs and Tests ordered today are listed in the Patient Instructions below. Patient Instructions  Medication Instructions:  Stop Lisinopril - WAIT 48 HOURS, THEN- START Entresto  24-26 mg Twice daily  STOP Metoprolol  Tartrate- START- Metoprolol  Succinate 200 mg once a day  *If you need a refill on your cardiac medications before your next appointment, please call your pharmacy*  Follow-Up: At [facility], you and your health needs are our priority.  As part of our continuing mission to provide you with exceptional heart care, our providers are all part of one team.  This team includes your primary Cardiologist (physician) and Advanced Practice Providers or APPs (Physician Assistants and Nurse  Practitioners) who all work together to provide you with the care you need, when you need it.  Your next appointment:   [facility]- 2-3 weeks (if not available- any APP is fine)  3-4 months with Dr Alvis Ba  We recommend signing up for the patient portal called "MyChart".  Sign up information is provided on this After Visit Summary.  MyChart is used to connect with patients for Virtual Visits (Telemedicine).  Patients are able to view lab/test results, encounter notes, upcoming appointments, etc.  Non-urgent messages can be sent to your provider as well.   To learn more about what you can do with MyChart, go to [email or web address].           Signed, Luana Rumple, MD  12/13/2023 11:49 AM    [facility] [address] [place], [place], [place]  [address] Phone: [phone]; Fax: [phone]

## 2023-12-13 ENCOUNTER — Encounter: Payer: Self-pay | Admitting: Cardiovascular Disease

## 2023-12-31 ENCOUNTER — Ambulatory Visit (INDEPENDENT_AMBULATORY_CARE_PROVIDER_SITE_OTHER): Payer: Medicare Other

## 2023-12-31 DIAGNOSIS — I442 Atrioventricular block, complete: Secondary | ICD-10-CM

## 2024-01-01 NOTE — Progress Notes (Unsigned)
 Cardiology Clinic Note   Date: 01/03/2024 ID: David [name], DOB January 09, 1933, MRN [id number]  Primary Cardiologist:  David Rumple, MD  Patient Profile    David [name] is a 89 y.o. male who presents to the clinic today for follow up for chest pain and shortness of breath.     Past medical history significant for: CAD. CABG x 5 09/10/1988: LIMA to LAD, SVG to PDA PL, sequential SVG to RI, OM1 and OM 2. LHC 04/09/2000 (angina): Severe native CAD.  Mid LAD 75%.  Mid RCA 100%.  Proximal LCx 100%.  Patent grafts. LHC 01/04/2006 (angina): Proximal 100%, proximal LCx 100%, mid RCA 100%.  Patent LIMA with mid to distal LAD occlusion beyond LIMA with retrograde collateral flow from RCA collaterals.  PCI with stent to SVG to PDA, stent to SVG to OM1/OM 2/RI. LHC 03/04/2013 (high risk nuclear stress test): Patent LIMA to LAD.  Patent SVG to PDA with occluded sequential limb to PLA.  SVG to OM1 and OM 2 with 90% mid in-stent restenosis and 90% stenosis distal to previously placed stent.  PCI with [place] Balloon angioplasty and DES to SVG to OM1 and OM 2. LHC 03/05/2023 (abnormal PET stress): Patent LIMA to LAD.  SVG to RCA occluded.  Proximal graft lesion 100%.  Proximal graft lesion before OM1 20%.  OM graft in-stent lesion-mid graft lesion before OM1 90%.  PTCA of calcified lesion in prior stent (2014) and SVG to OM jump graft.  Unable to fully expand lesion. Ischemic cardiomyopathy. Echo 02/23/2021: EF 45 to 50%.  Apical akinesis.  Grade I DD.  Elevated left atrial pressure.  Normal RV function.  Trivial MR.  Mild to moderate aortic valve sclerosis/calcification without stenosis. Echo 08/28/2023 EF 45 to 50% LV with mildly decreased function, LV internal cavity mildly dilated, grade 2 diastolic dysfunction, RV systolic function and size was normal, normal pulmonary artery pressures.  No significant change from prior study Complete heart block. S/p PPM placement 1995. GEN change 2002 and 2009. Lead revision  2017. Remote check in 06/2023 showed pacemaker dependent, battery status was good, lead measurements stable.  Carotid artery disease. Carotid duplex 08/15/23: Carotid duplex in 08/2023 R ICA consistent with 60-79% stenosis. CT angio of the neck indicated severe stenosis and subtotal occlusion of the at the origin of the right internal carotid artery PVD. PV angiogram 08/31/2013: Unsuccessful PTA attempt CTO left popliteal artery. PV angiogram 10/19/2013: Successful atherectomy, PTA and stenting right popliteal artery. Vascular ultrasound lower extremity/ABI 02/15/2022: Moderate bilateral lower extremity arterial disease.  Right: Moderate progression compared to prior study.  Extensive calcifications throughout.  Distal SFA now 50 to 74%.  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  Patent popliteal artery stent without evidence of stenosis.  Left: Moderate progression compared to prior study.  Extensive calcifications throughout.  Distal SFA now 50 to 74%.  Known left popliteal artery to TPT stent occlusion.  Native TPT occlusion. Non-Hodgkin's lymphoma.     History of Present Illness    David [name] is followed by Dr. Alvis [name] and Dr. Katheryne [name] for the above outlined history. David [name] has extensive CAD with history of a CABG in 1990 and multiple revascularization procedures as well as mild ischemic cardiomyopathy.  Been followed by Dr. Dean [name] for history of revascularization procedures for bilateral lower extremities and asymptomatic stenosis of carotid arteries.  Has a history of complete heart block with PPM placement in 1995 followed by generator exchange and lead revision.  Office visit  in 02/2023 with Dr. Alvis [name] patient had complaints of slowly progressing angina.  He reported chest discomfort when making the bed or climbing a few stairs requiring rest or sublingual nitroglycerin .  He was requiring nitroglycerin  approximately twice a week in addition to his antianginal therapy.  At that  time he was started on amlodipine  and underwent PET/CT.  PET stress findings consistent with extensive ischemia, he underwent LHC that indicated occlusion of the native LAD, the major OM branch and the native right coronary artery. There was a patent LIMA to the distal LAD artery and the saphenous vein graft to the RCA was occluded. There was also 90% in-stent stenosis in the saphenous vein graft to the oblique marginal artery. Angioplasty was performed without placement of a new stent and the recommendation was made for 6 months of dual antiplatelet therapy with aspirin  and clopidogrel .  Following the catheterization he protracted.  Of severe itching without obvious rashes, finally resolved with antihistamines and topical cream.  Had previously taken clopidogrel  and has had numerous contrast based studies without pruritus like this.  He was seen in clinic on 06/03/2023 by Dr. Alvis [name].  At that time he reported that he had greatly improved.  He can any nitroglycerin .  He reported angina that only occurred with greater activity than usual.  He was continued on long-acting nitrates and metoprolol .  His biggest concern at the time was fatigue, patient noted numbness in both legs for long period of times, consistent with his known lumbar spine stenosis.  Lisinopril  was decreased to 5 mg daily as his diastolic blood pressure was consistently low.  On 08/21/2023 patient reported that he had been having chest pain and shortness of breath occur when walking down the hallway or changing close, will last for a few minutes and resolve with rest.  He had not required any sublingual nitroglycerin , he reported the chest pain was not as intense as chest pain prior to catheterization.  Echocardiogram on 08/28/2023 indicated LVEF of 45 to 50%, LV internal cavity size mildly dilated, diastolic parameters were consistent with grade 2 diastolic dysfunction.  Reviewed with Dr. Alvis [name], felt that overall his echocardiogram was  unchanged compared to priors.  It was recommended that patient start on Jardiance .  Follow-up on 10/10/2023 patient reported he was doing very well, he denied any further chest pain and felt that his breathing had improved since starting on Jardiance .  Patient was last seen in clinic on 12/12/2023, as noted he had developed symptoms of congestive heart failure as well exertional angina.  It was noted that his exertional chest pain had completely resolved but he was having substantial problems with dyspnea on exertion, NYHA functional class III.  He denied developing any lower extremity edema and did not have any shortness of breath at rest.  Patient was switched from lisinopril  to Entresto  and his metoprolol  was switched to metoprolol  succinate.  Today he presents for follow-up.  He reports that he has been doing well, reports significant improvement in his shortness of breath since starting on Entresto .  He denies any significant chest pain, notes that he may have a very slight chest discomfort with ongoing exertion that resolves with rest.  He reports in the last month he took 1 sublingual nitroglycerin  however felt that he took the medication too early as his discomfort was already resolving by the time he took the medication.  Patient reports that this slight chest discomfort has been present since his cardiac catheterization, denies any significant chest pain similar  to what led to his prior cardiac catheterization.  He denies any lower extremity edema, orthopnea or PND.  He reports that this is the best he has felt in a significant amount of time.  ROS: All other systems reviewed and are otherwise negative except as noted in History of Present Illness. EKGs/Labs Reviewed        10/10/2023: BUN 18; Creatinine, Ser 1.19; Potassium 4.9; Sodium 141   02/28/2023: Hemoglobin 12.7; WBC 5.7   08/15/2023: Hemoglobin 13.2, hematocrit 39.3, WBC 5.7, sodium 139, potassium 4.4, creatinine 1.23, EGFR 56, AST 23, ALT  22  Physical Exam    VS:  BP 106/62 (BP Location: Right Arm, Cuff Size: Normal)   Pulse 60   Ht 5\' 9"  (1.753 m)   Wt 189 lb 6.4 oz (85.9 kg)   SpO2 96%   BMI 27.97 kg/m  , BMI Body mass index is 27.97 kg/m.  GEN: Well nourished, well developed, in no acute distress. Neck: No JVD. Bilateral carotid bruits. Cardiac:  RRR. No murmurs. No rubs or gallops.   Respiratory:  Respirations regular and unlabored. Clear to auscultation without rales, wheezing or rhonchi. GI: Soft, nontender, nondistended. Extremities: Radials/DP/PT 2+ and equal bilaterally. No clubbing or cyanosis. No edema   Skin: Warm and dry, no rash. Neuro: Strength intact.  Assessment & Plan   CAD: Extensive history of CABG in 1990 and multiple revascularization procedures.  Patient underwent LHC in July 2024 with PTCA to SVG to OMG graft.  Noted by Dr. Alvis [name] he may still have some ischemic myocardium in the distribution of the right coronary artery although perfusion studies have suggested that this is mostly scar.  No options for percutaneous revascularization of the inferior wall, recommend continuing with medical management. Today he reports that he is doing very well, he notes "very minor" chest discomfort that occurs with ongoing exertion, resolves quickly with rest.  He reports he did take 1 sublingual nitroglycerin  in the last month, feels that he took the medication too early as he notes that his chest pain was already resolving prior to taking a medication.  He reports that he has had this intermittent discomfort for quite a few months, denies any significant chest pain similar to his prior events.  He reports that this is the best he has felt in a long time. Encouraged patient to increase his activity as tolerated.  Reviewed ED precautions.  Continue aspirin  81 mg daily, Vascepa  2 g twice daily, Imdur  90 mg twice daily, Entresto  24-26 mg twice daily, metoprolol  succinate 200 mg daily and Crestor  40 mg  daily.  Ischemic cardiomyopathy/dyspnea on exertion: Echo in July 2022 indicated EF 45 to 50%, Grade I DD.  On nuclear Q GS EF was 36% on 01/18/2021.  PET stress on 02/27/2023 indicated rest EF 47%, stress EF 36%.  Echo on 08/28/2023 indicated LVEF of 45 to 50%, G2 DD.  Patient was started on Jardiance  in January and Ashlea Dusing [place] earlier this month.  He reports that his shortness of breath has significantly improved. Today he appears euvolemic and well compensated on exam.  He denies lower extremity edema, orthopnea or PND.  He reports his weights have been stable.  Reviewed ED precautions.  Continue Imdur , Entresto , Jardiance  and metoprolol  succinate. Check BMET.   Complete heart block: S/p PPM placement in 1995, gen x2 in 2002 and 2009, lead revision in 2017. Device is MRI compatible however prior leads remain in place, per Dr. Leola Raisin notes patient should not undergo MRI. Remote check  on 10/01/2023 showed patient with complete heart block, pacemaker dependent, battery status was good, lead measurements stable.   Hypertension: Blood pressure today 106/62. Patient denies dizziness, lightheadedness presyncope or syncope.  He will continue to monitor at home and notify the office if he develops symptoms or lower blood pressure.  Continue current antihypertensive regimen. Refill of Entresto  provided, check BMET.   Hyperlipidemia: Last lipid profile on 08/15/2023 indicated total cholesterol 122, HDL 36, triglycerides 169, LDL 57.  Continue current regimen.  Carotid artery stenosis: Carotid duplex in 08/2023 R ICA consistent with 60-79% stenosis. Per Dr. Katheryne [name] there is significant RCA stenosis. CT angio of the neck indicated severe stenosis and subtotal occlusion of the at the origin of the right internal carotid artery, patient has been seen by vascular surgery, patient will not undergo surgery at this time and will continue to monitor.  PVD: Patient with history of claudication and PAD. In 08/2023 had unsuccessful  PTA of left popliteal artery. Atherectomy, PTA, stent right poplitela artery in March 2015. Vascular ultrasound/ABI in 02/2022 showed moderate bilateral lower extremity arterial disease with moderate progression compared to prior study. Followed by Dr. Katheryne [name]. Continue Vascepa , aspirin , Plavix  and rosuvastatin .   Disposition: Follow up with Dr. Alvis [name] in 3 months as scheduled.     Signed, Michael Walrath, NP

## 2024-01-02 LAB — CUP PACEART REMOTE DEVICE CHECK
Battery Remaining Longevity: 54 mo
Battery Remaining Percentage: 53 %
Brady Statistic RA Percent Paced: 97 %
Brady Statistic RV Percent Paced: 99 %
Date Time Interrogation Session: 20250527022100
Implantable Lead Connection Status: 753985
Implantable Lead Connection Status: 753985
Implantable Lead Implant Date: 20170425
Implantable Lead Implant Date: 20170425
Implantable Lead Location: 753859
Implantable Lead Location: 753860
Implantable Lead Model: 7741
Implantable Lead Model: 7742
Implantable Lead Serial Number: 720495
Implantable Lead Serial Number: 763888
Implantable Pulse Generator Implant Date: 20170425
Lead Channel Impedance Value: 687 Ohm
Lead Channel Impedance Value: 781 Ohm
Lead Channel Pacing Threshold Amplitude: 0.8 V
Lead Channel Pacing Threshold Pulse Width: 0.4 ms
Lead Channel Setting Pacing Amplitude: 1.3 V
Lead Channel Setting Pacing Amplitude: 2.5 V
Lead Channel Setting Pacing Pulse Width: 0.4 ms
Lead Channel Setting Sensing Sensitivity: 2.5 mV
Pulse Gen Serial Number: 718025
Zone Setting Status: 755011

## 2024-01-03 ENCOUNTER — Ambulatory Visit: Attending: Cardiology | Admitting: Cardiology

## 2024-01-03 ENCOUNTER — Encounter: Payer: Self-pay | Admitting: Cardiology

## 2024-01-03 VITALS — BP 106/62 | HR 60 | Ht 69.0 in | Wt 189.4 lb

## 2024-01-03 DIAGNOSIS — I739 Peripheral vascular disease, unspecified: Secondary | ICD-10-CM | POA: Diagnosis not present

## 2024-01-03 DIAGNOSIS — I10 Essential (primary) hypertension: Secondary | ICD-10-CM | POA: Diagnosis not present

## 2024-01-03 DIAGNOSIS — Z950 Presence of cardiac pacemaker: Secondary | ICD-10-CM

## 2024-01-03 DIAGNOSIS — I5022 Chronic systolic (congestive) heart failure: Secondary | ICD-10-CM

## 2024-01-03 DIAGNOSIS — I442 Atrioventricular block, complete: Secondary | ICD-10-CM | POA: Diagnosis not present

## 2024-01-03 DIAGNOSIS — I25718 Atherosclerosis of autologous vein coronary artery bypass graft(s) with other forms of angina pectoris: Secondary | ICD-10-CM | POA: Diagnosis not present

## 2024-01-03 DIAGNOSIS — E782 Mixed hyperlipidemia: Secondary | ICD-10-CM

## 2024-01-03 MED ORDER — SACUBITRIL-VALSARTAN 24-26 MG PO TABS: 1.0000 | ORAL_TABLET | Freq: Two times a day (BID) | ORAL | 3 refills | Status: DC

## 2024-01-03 MED ORDER — METOPROLOL SUCCINATE ER 200 MG PO TB24: 200.0000 mg | ORAL_TABLET | Freq: Every day | ORAL | 3 refills | Status: DC

## 2024-01-03 NOTE — Patient Instructions (Addendum)
 Medication Instructions:  No changes *If you need a refill on your cardiac medications before your next appointment, please call your pharmacy*  Lab Work: Today we are going to draw a Bmet If you have labs (blood work) drawn today and your tests are completely normal, you will receive your results only by: MyChart Message (if you have MyChart) OR A paper copy in the mail If you have any lab test that is abnormal or we need to change your treatment, we will call you to review the results.  Testing/Procedures: No testing  Follow-Up: At [facility], you and your health needs are our priority.  As part of our continuing mission to provide you with exceptional heart care, our providers are all part of one team.  This team includes your primary Cardiologist (physician) and Advanced Practice Providers or APPs (Physician Assistants and Nurse Practitioners) who all work together to provide you with the care you need, when you need it.  Your next appointment:   Already scheduled  We recommend signing up for the patient portal called "MyChart".  Sign up information is provided on this After Visit Summary.  MyChart is used to connect with patients for Virtual Visits (Telemedicine).  Patients are able to view lab/test results, encounter notes, upcoming appointments, etc.  Non-urgent messages can be sent to your provider as well.   To learn more about what you can do with MyChart, go to [email or web address].

## 2024-01-04 LAB — BASIC METABOLIC PANEL WITH GFR
BUN/Creatinine Ratio: 16 (ref 10–24)
BUN: 19 mg/dL (ref 8–27)
CO2: 23 mmol/L (ref 20–29)
Calcium: 9.2 mg/dL (ref 8.6–10.2)
Chloride: 101 mmol/L (ref 96–106)
Creatinine, Ser: 1.16 mg/dL (ref 0.76–1.27)
Glucose: 113 mg/dL — ABNORMAL HIGH (ref 70–99)
Potassium: 4.6 mmol/L (ref 3.5–5.2)
Sodium: 142 mmol/L (ref 134–144)
eGFR: 60 mL/min/{1.73_m2} (ref >59)

## 2024-01-06 ENCOUNTER — Ambulatory Visit: Payer: Self-pay | Admitting: Cardiology

## 2024-01-07 ENCOUNTER — Telehealth: Payer: Self-pay | Admitting: Cardiovascular Disease

## 2024-01-07 ENCOUNTER — Telehealth: Payer: Self-pay | Admitting: Pharmacy Technician

## 2024-01-07 ENCOUNTER — Other Ambulatory Visit (HOSPITAL_COMMUNITY): Payer: Self-pay

## 2024-01-07 NOTE — Telephone Encounter (Signed)
 Called [facility] and gave clarification on these medications and pharmacy stated that they were going to refill these medications. Rx sent on 01/03/24 with refills. Confirmation from both

## 2024-01-07 NOTE — Telephone Encounter (Signed)
 Pharmacy Patient Advocate Encounter   Received notification from Pt Calls Messages that prior authorization for Metoprolol  200 MG is required/requested.   Insurance verification completed.   The patient is insured through [employer] .   Per test claim: Refill too soon. PA is not needed at this time. Medication was filled 01/03/24. Next eligible fill date is 03/11/24.

## 2024-01-07 NOTE — Telephone Encounter (Signed)
 Pharmacy Patient Advocate Encounter   Received notification from Pt Calls Messages that prior authorization for Entresto  24-26 is required/requested.   Insurance verification completed.   The patient is insured through [employer] .   Per test claim: Refill too soon. PA is not needed at this time. Medication was filled 01/03/24. Next eligible fill date is 03/11/24.

## 2024-01-07 NOTE — Telephone Encounter (Signed)
 Pt c/o medication issue:  1. Name of Medication: Toprol  XL, and Entresto   2. How are you currently taking this medication (dosage and times per day)?   3. Are you having a reaction (difficulty breathing--STAT)?   4. What is your medication issue? Patient says [facility] contacted him and said they were waiting on information  from here, so that they can refill these medicine Order # [id number]

## 2024-01-08 ENCOUNTER — Ambulatory Visit: Payer: Self-pay | Admitting: Cardiovascular Disease

## 2024-01-08 MED ORDER — METOPROLOL SUCCINATE ER 200 MG PO TB24
200.0000 mg | ORAL_TABLET | Freq: Every day | ORAL | 0 refills | Status: AC
Start: 2024-01-08 — End: ?

## 2024-01-08 MED ORDER — SACUBITRIL-VALSARTAN 24-26 MG PO TABS: 1.0000 | ORAL_TABLET | Freq: Two times a day (BID) | ORAL | 0 refills | Status: AC

## 2024-01-08 NOTE — Telephone Encounter (Signed)
 Rx for a 10 day supply sent to requested Pharmacy

## 2024-01-08 NOTE — Telephone Encounter (Signed)
 Pt c/o medication issue:  1. Name of Medication:   metoprolol  (TOPROL -XL) 200 MG 24 hr tablet    sacubitril -valsartan  (ENTRESTO ) 24-26 MG    2. How are you currently taking this medication (dosage and times per day)? As written  3. Are you having a reaction (difficulty breathing--STAT)? No  4. What is your medication issue?  Patient stated he will run out of medication this weekend. Patient requested for us  to send a 10 day supply for both medications at the [facility] in [place]. Please advise.

## 2024-01-08 NOTE — Addendum Note (Signed)
 Addended by: Debrah Fan, Elchonon Maxson L on: 01/08/2024 11:38 AM   Modules accepted: Orders

## 2024-01-16 ENCOUNTER — Other Ambulatory Visit: Payer: Self-pay | Admitting: Cardiovascular Disease

## 2024-01-21 DIAGNOSIS — I25708 Atherosclerosis of coronary artery bypass graft(s), unspecified, with other forms of angina pectoris: Secondary | ICD-10-CM | POA: Diagnosis not present

## 2024-01-21 DIAGNOSIS — K921 Melena: Secondary | ICD-10-CM | POA: Diagnosis not present

## 2024-01-21 DIAGNOSIS — I5022 Chronic systolic (congestive) heart failure: Secondary | ICD-10-CM | POA: Diagnosis not present

## 2024-01-23 ENCOUNTER — Other Ambulatory Visit: Payer: Self-pay | Admitting: Internal Medicine

## 2024-01-23 DIAGNOSIS — K625 Hemorrhage of anus and rectum: Secondary | ICD-10-CM | POA: Diagnosis not present

## 2024-01-23 DIAGNOSIS — Z8719 Personal history of other diseases of the digestive system: Secondary | ICD-10-CM | POA: Diagnosis not present

## 2024-01-28 ENCOUNTER — Other Ambulatory Visit: Payer: Self-pay | Admitting: Cardiology

## 2024-01-28 ENCOUNTER — Ambulatory Visit
Admission: RE | Admit: 2024-01-28 | Discharge: 2024-01-28 | Disposition: A | Discharge status: Home / Self Care | Source: Ambulatory Visit | Attending: Internal Medicine | Admitting: Internal Medicine

## 2024-01-28 DIAGNOSIS — N2889 Other specified disorders of kidney and ureter: Secondary | ICD-10-CM | POA: Diagnosis not present

## 2024-01-28 DIAGNOSIS — K8020 Calculus of gallbladder without cholecystitis without obstruction: Secondary | ICD-10-CM | POA: Diagnosis not present

## 2024-01-28 DIAGNOSIS — K625 Hemorrhage of anus and rectum: Secondary | ICD-10-CM

## 2024-01-28 DIAGNOSIS — K5730 Diverticulosis of large intestine without perforation or abscess without bleeding: Secondary | ICD-10-CM | POA: Diagnosis not present

## 2024-01-28 DIAGNOSIS — N200 Calculus of kidney: Secondary | ICD-10-CM | POA: Diagnosis not present

## 2024-01-30 ENCOUNTER — Other Ambulatory Visit: Payer: Self-pay | Admitting: Internal Medicine

## 2024-01-30 DIAGNOSIS — N289 Disorder of kidney and ureter, unspecified: Secondary | ICD-10-CM

## 2024-01-31 ENCOUNTER — Ambulatory Visit
Admission: RE | Admit: 2024-01-31 | Discharge: 2024-01-31 | Disposition: A | Discharge status: Home / Self Care | Source: Ambulatory Visit | Attending: Internal Medicine | Admitting: Internal Medicine

## 2024-01-31 DIAGNOSIS — N200 Calculus of kidney: Secondary | ICD-10-CM | POA: Diagnosis not present

## 2024-01-31 DIAGNOSIS — N289 Disorder of kidney and ureter, unspecified: Secondary | ICD-10-CM

## 2024-01-31 DIAGNOSIS — N281 Cyst of kidney, acquired: Secondary | ICD-10-CM | POA: Diagnosis not present

## 2024-02-17 ENCOUNTER — Inpatient Hospital Stay: Payer: Medicare Other | Attending: Oncology | Admitting: Oncology

## 2024-02-17 VITALS — BP 108/58 | HR 77 | Temp 97.8°F | Resp 18 | Ht 69.0 in | Wt 187.8 lb

## 2024-02-17 DIAGNOSIS — C8208 Follicular lymphoma grade I, lymph nodes of multiple sites: Secondary | ICD-10-CM

## 2024-02-17 DIAGNOSIS — C859A Non-Hodgkin lymphoma, unspecified, in remission: Secondary | ICD-10-CM | POA: Diagnosis not present

## 2024-02-17 NOTE — Progress Notes (Signed)
  [facility] OFFICE PROGRESS NOTE   Diagnosis: Non-Hodgkin's lymphoma  INTERVAL HISTORY:   Mr. Abdelaziz returns as scheduled.  He generally feels well.  Good appetite.  No fever or night sweats.  No palpable lymph nodes.  He reports intermittent rectal bleeding for several months.  He saw Dr. Kriss and reports being diagnosed with diverticulitis .  The bleeding has persisted.  He describes dark blood mixed with stool.  The bleeding is intermittent.  No abdominal pain, fever, or nausea.  Objective:  Vital signs in last 24 hours:  Blood pressure (!) 108/58, pulse 77, temperature 97.8 F (36.6 C), temperature source Temporal, resp. rate 18, height 5' 9 (1.753 m), weight 187 lb 12.8 oz (85.2 kg), SpO2 100%.    Lymphatics: No cervical, supraclavicular, axillary, or inguinal nodes Resp: Lungs clear bilaterally Cardio: Regular rate and rhythm GI: No hepatosplenomegaly, no mass, nontender Vascular: No leg edema  Lab Results:  Lab Results  Component Value Date   WBC 5.7 02/28/2023   HGB 12.7 (L) 02/28/2023   HCT 36.5 (L) 02/28/2023   MCV 92 02/28/2023   PLT 134 (L) 02/28/2023   NEUTROABS 5.0 07/15/2021   08/15/2023: Hemoglobin 13.2, platelets 166,000, WBC 5.7, ANC 3.5 CMP  Lab Results  Component Value Date   NA 142 01/03/2024   K 4.6 01/03/2024   CL 101 01/03/2024   CO2 23 01/03/2024   GLUCOSE 113 (H) 01/03/2024   BUN 19 01/03/2024   CREATININE 1.16 01/03/2024   CALCIUM  9.2 01/03/2024   PROT 6.9 07/15/2021   ALBUMIN 4.1 07/15/2021   AST 30 07/15/2021   ALT 34 07/15/2021   ALKPHOS 46 07/15/2021   BILITOT 0.9 07/15/2021   GFRNONAA 58 (L) 07/15/2021   GFRAA >60 05/07/2016     Medications: I have reviewed the patient's current medications.   Assessment/Plan: Non-Hodgkin's lymphoma, follicular B-cell, low grade, involving a left cervical lymph node with a staging evaluation revealing evidence for advanced stage disease. There was cervical, mediastinal,  and abdominal lymphadenopathy and a question of splenic involvement on staging scans. He completed 4 cycles of fludarabine/rituximab with resolution of the palpable lymphadenopathy. The last chemotherapy was given in June 2011.   Skin rash following cycle #1 of fludarabine/rituximab, likely related to allopurinol or Bactrim. The Bactrim prophylaxis was discontinued. History of Chronic Thrombocytopenia secondary chemotherapy and non-Hodgkin's lymphoma-normal today History of neutropenia secondary to chemotherapy. Injection site reaction to a pneumococcal vaccine in October 2011. History of coronary artery disease and peripheral vascular disease, followed by cardiology papillary urothelial carcinoma, status post transurethral resection and intravesical mitomycin -C 04/13/2016 Spinal stenosis       Disposition: Mr. Volney is in clinical remission from lymphoma.  He would like to continue follow-up in the medical oncology clinic.  He will return for an office visit in 1 year.  I recommended he follow-up with Dr. Kriss to evaluate persistent rectal bleeding.  The hemoglobin and platelet count were normal in January of this year.  He reports a recent lab evaluation with Dr. Loreli.  Arley Hof, MD  02/17/2024  10:33 AM

## 2024-02-19 ENCOUNTER — Encounter: Payer: Self-pay | Admitting: Cardiovascular Disease

## 2024-02-20 NOTE — Addendum Note (Signed)
 Addended by: TAWNI DRILLING D on: 02/20/2024 01:12 PM   Modules accepted: Orders

## 2024-02-20 NOTE — Progress Notes (Signed)
 Remote pacemaker transmission.

## 2024-02-21 ENCOUNTER — Other Ambulatory Visit: Payer: Self-pay | Admitting: Cardiovascular Disease

## 2024-02-24 ENCOUNTER — Encounter: Payer: Self-pay | Admitting: Cardiovascular Disease

## 2024-02-24 MED ORDER — RANOLAZINE ER 1000 MG PO TB12: 1000.0000 mg | ORAL_TABLET | Freq: Two times a day (BID) | ORAL | 6 refills | Status: DC

## 2024-02-24 MED ORDER — RANOLAZINE ER 500 MG PO TB12: 500.0000 mg | ORAL_TABLET | Freq: Two times a day (BID) | ORAL | 0 refills | Status: DC

## 2024-02-24 NOTE — Telephone Encounter (Signed)
 Spoke with David [name], sounds like slowly worsening exertional angina following the PCI exactly 12 months ago, almost certainly in-stent restenosis in old SVG-OM. BP does not allow titration of conventional antianginals and he prefers to avoid invasive procedures as long as there are other options. Will try ranolazine . If unsuccessful, will plan repeat cardiac catheterization/PCI.

## 2024-02-24 NOTE — Telephone Encounter (Signed)
 Please send in a prescription for ranolazine  500 mg twice daily for a week and then 1000 mg twice daily thereafter. Send us  an update on symptoms after he has increased to the full dose next week. Keep 08/21 appointment for now. If symptoms do not improve, will plan cardiac catheterization and repeat PCI to SVG- OM.

## 2024-03-02 ENCOUNTER — Encounter: Payer: Self-pay | Admitting: Cardiovascular Disease

## 2024-03-03 ENCOUNTER — Encounter: Payer: Self-pay | Admitting: Cardiovascular Disease

## 2024-03-11 ENCOUNTER — Encounter: Payer: Self-pay | Admitting: Cardiovascular Disease

## 2024-03-11 NOTE — Telephone Encounter (Signed)
 See previous message encounter from 8/6.

## 2024-03-11 NOTE — Telephone Encounter (Signed)
 Please just prescribe 20 tablets of furosemide  40 mg, no refills, to take only as needed.  Thank you

## 2024-03-11 NOTE — Telephone Encounter (Signed)
 Called patient. Verified name and DOB. Pt having intermittent shortness of breath, it started a few days ago and cannot recall the day it started. Pt stated sob is triggered when walking upstairs in home.Pt also stated having difficulty speaking. I asked if pt feels his throat closing up, pt denied symptoms. Patient denied feeling dizziness and chest pain. Pt denied weight gain. Pt has been taking Ranexa  for 2 weeks and his chest tightness has improved since starting medication. Pt has been having sinus congestion for the past 2 weeks. Patient checked blood pressure while on phone, 120/66 HR 72. Patient does not have a way to check SpO2.   Patient has appointment with Dr. Francyne on 03/26/2024. I recommended patient to go to ER for evaluation if shortness of breath worsens and if new symptoms arise between now and his appointment date. Patient verbalized understanding.  Josie RN

## 2024-03-12 MED ORDER — FUROSEMIDE 40 MG PO TABS: 40.0000 mg | ORAL_TABLET | Freq: Every day | ORAL | 0 refills | Status: DC | PRN

## 2024-03-12 NOTE — Addendum Note (Signed)
 Addended by: Makenah Karas L on: 03/12/2024 06:20 PM   Modules accepted: Orders

## 2024-03-19 ENCOUNTER — Encounter: Payer: Self-pay | Admitting: Cardiovascular Disease

## 2024-03-19 NOTE — Telephone Encounter (Signed)
 Restart Ranolazine  1000 mg twice daily please

## 2024-03-20 MED ORDER — RANOLAZINE ER 1000 MG PO TB12: 1000.0000 mg | ORAL_TABLET | Freq: Two times a day (BID) | ORAL | 11 refills | Status: DC

## 2024-03-23 ENCOUNTER — Encounter: Payer: Self-pay | Admitting: Cardiovascular Disease

## 2024-03-26 ENCOUNTER — Ambulatory Visit: Attending: Cardiovascular Disease | Admitting: Cardiovascular Disease

## 2024-03-26 ENCOUNTER — Encounter: Payer: Self-pay | Admitting: Cardiovascular Disease

## 2024-03-26 VITALS — BP 100/50 | HR 67 | Ht 69.0 in | Wt 186.0 lb

## 2024-03-26 DIAGNOSIS — I5042 Chronic combined systolic (congestive) and diastolic (congestive) heart failure: Secondary | ICD-10-CM | POA: Insufficient documentation

## 2024-03-26 DIAGNOSIS — Z79899 Other long term (current) drug therapy: Secondary | ICD-10-CM | POA: Diagnosis not present

## 2024-03-26 DIAGNOSIS — I4729 Other ventricular tachycardia: Secondary | ICD-10-CM | POA: Diagnosis not present

## 2024-03-26 DIAGNOSIS — I442 Atrioventricular block, complete: Secondary | ICD-10-CM | POA: Diagnosis not present

## 2024-03-26 DIAGNOSIS — E785 Hyperlipidemia, unspecified: Secondary | ICD-10-CM | POA: Insufficient documentation

## 2024-03-26 DIAGNOSIS — I25718 Atherosclerosis of autologous vein coronary artery bypass graft(s) with other forms of angina pectoris: Secondary | ICD-10-CM | POA: Diagnosis not present

## 2024-03-26 DIAGNOSIS — I10 Essential (primary) hypertension: Secondary | ICD-10-CM | POA: Diagnosis not present

## 2024-03-26 DIAGNOSIS — I739 Peripheral vascular disease, unspecified: Secondary | ICD-10-CM | POA: Insufficient documentation

## 2024-03-26 DIAGNOSIS — I5022 Chronic systolic (congestive) heart failure: Secondary | ICD-10-CM | POA: Diagnosis not present

## 2024-03-26 MED ORDER — FUROSEMIDE 40 MG PO TABS: 40.0000 mg | ORAL_TABLET | ORAL | 3 refills | Status: AC

## 2024-03-26 MED ORDER — SPIRONOLACTONE 25 MG PO TABS
12.5000 mg | ORAL_TABLET | Freq: Every day | ORAL | 3 refills | Status: AC
Start: 2024-03-26 — End: ?

## 2024-03-26 NOTE — Progress Notes (Signed)
 Patient ID: David [name], male   DOB: 1932/10/29, 89 y.o.   MRN: [id number]      Cardiology Office Note    Date:  03/26/2024   ID:  David [name], DOB July 19, 1933, MRN [id number]  PCP:  David Kins, MD  Cardiologist:  David Lesches, MD (PAD);  David Balding, MD   No chief complaint on file.    History of Present Illness:  David [name] is a 89 y.o. male David [name] is a 89 y.o. male with complete heart block, coronary artery disease, PAD of the lower extremities (has occluded popliteal stents in the left lower extremity), symptomatic PVCs (and one episode of lengthy nonsustained VT detected by his pacemaker), CHF with mildly reduced LVEF.  For the first time this year do not develop symptoms of congestive heart failure and exertional angina.  Treatment with Jardiance  led to resolution of his angina pectoris, but he continued to have issues with exertional dyspnea.  Recently he is also developed episodes of what he calls anxiety which actually sound like paroxysmal nocturnal dyspnea.  He has not had any associated weight change, lower extremity edema and so he has not been taking his furosemide  at all.  He stopped ranolazine  since this was causing severe constipation and he has not had any recurrence of the chest pain.  He continues to take high doses of isosorbide  mononitrate (90 mg twice daily) and metoprolol  succinate (200 mg daily).  His blood pressure remains borderline low around 100/60.  Cardiac catheterization on 03/05/2023 showed occlusion of the native LAD, the major OM branch and the native right coronary artery.  There was a patent LIMA to the distal LAD artery and the saphenous vein graft to the RCA was occluded.  There was also 90% in-stent stenosis in the saphenous vein graft to the oblique marginal artery.  Angioplasty was performed without placement of a new stent and the recommendation was made for 6 months of dual antiplatelet therapy with aspirin  and clopidogrel .  His  echocardiogram in January showed findings consistent with elevated mean left atrial pressure (pseudonormal mitral inflow, E/e'> 20).  It also showed LVEF 45-50% with apical akinesis, but without major valvular problems.  Heart failure medication doses have been limited by low blood pressure, but he seems to be tolerating Entresto .  Pacemaker interrogation shows normal device function.  Estimated generator longevity is 4 years.  He has not had any atrial fibrillation or ventricular tachycardia, but has rare episodes of very brief paroxysmal atrial tachycardia up to 8 seconds in duration.  Lead parameters are stable.  At his last pacemaker appointment we made his minute ventilation sensor more aggressive, but his heart rate histogram still shows substantial blunting, probably because he is sedentary.  He is pacemaker dependent due to complete AV block and presenting rhythm is AV sequential pacing. He has a Radiation protection practitioner dual-chamber pacemaker that was implanted as new system with a new generator and leads in 2017, when his old device with unipolar leads reached end of service.  The old leads were left in place so even though his system is MRI compatible, he should not have an MRI due to abandoned leads.    After the most recent catheterization he had a protracted period of severe itching without obvious rashes, that has finally resolved with antihistamines and a topical cream.  Note that he had taken clopidogrel  before and has had numerous contrast based studies without pruritus like this.  Metabolic control is good with  an LDL cholesterol of 57, albeit with a low HDL of 36.  Renal function is minimally abnormal, most recent creatinine 1.19.  He is not anemic, his hemoglobin is 13.2.   [employer] Accolade EL DR model number [id number], serial number [id number] Right atrial lead (new) [employer] MRI, model number 7741, serial number [id number] Right ventricular lead (new)  [employer] MRI, model number Q3701389, serial number [id number]. Old unipolar leads are still in place so that he is not MRI conditional despite the fact that the individual components of his new system are MRI conditional.  David [name] has had numerous coronary revascularization procedures, most recently a stent in the saphenous vein graft to the oblique marginal artery in July 2024. He is now almost 20 years status post coronary bypass surgery. The same bypass graft had received a stent in 2007. He has mild ischemic cardiomyopathy with an ejection fraction of 40-45%, without clinical  heart failure. The nuclear stress test on July 28, 2015 shows old scar in the apex and inferior wall. LVEF is calculated at 43% (identical to 2014 and 2015).   He had occlusion of the left popliteal artery with distal reconstitution via collaterals. He had successful recanalization with placement of 2 stents by Dr. Myra in 2015 . This led to marked improvement in his claudication, but the stents subsequently occluded.  Claudication improved with cilostazol . He is pacemaker dependent secondary to complete heart block. He had a generator changeout in 2017. Interrogation of his device today again shows some issues with noise on the atrial channel but none on the ventricular channel.  Moderate carotid artery disease (stable 60-79% proximal internal carotid artery stenosis on the right, mild plaque on the left), last duplex US  was February 11, 2015.   He has a history of non-Hodgkin's lymphoma with a large neck mass but also mediastinal and abdominal lymphadenopathy treated with radiotherapy to the cervical area in 2011 followed by chemotherapy with Rituxan and fludarabine. He is considered to be in remission.   He has undergone cystoscopic resection of a bladder tumor and bilateral cataract surgery.    Past Medical History:  Diagnosis Date   Arthritis    hands (10/19/2013)   Bilateral carotid bruits 04/08/2020   Right  greater than left   Bladder tumor    CAD (coronary artery disease)    cardiologist-  dr crouitoru   Carotid artery disease (HCC)    moderate pRICA 60-79%,  1-39% LICA , >50% RECA last duplex 08-05-2015   Cataract of both eyes    Complete heart block (HCC)    GERD (gastroesophageal reflux disease)    Glaucoma, both eyes    Hematuria    History of MI (myocardial infarction)    1989   Hyperlipidemia    Hypertension    Ischemic cardiomyopathy    ef 40-45%  without clinical heart failure per cardiology note 12-28-2015 (nuclear stress ef 43% from 07-28-2015)   Left ventricular dysfunction    Non Hodgkin's lymphoma (HCC) dx 06/2010---  oncologist-  dr cloretta---  in clinical remission since 2013   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 -- lymphadenopathy to neck, mediastinal , and abdominal--  chemotherapy completed 06/ 2011   NSVT (nonsustained ventricular tachycardia) [facility]) cardiologist-  dr crouitoru   Max rate 200 bpm, 28-beat, nearly 9-second episode recorded by pacemaker on November 15, asymptomatic    Pacemaker pacemaker dependant due to CHB   1st Pacemaker Placement 1998 w/ New  generator  12-31-2000 ; 08-04-2008;  11-29-2015   PAD (peripheral artery disease) (HCC)    followed by dr berry--- last duplex 08-05-2015  patents bilateral popliteal stents, bilateral SFA 30-49% stenosis   Peripheral vascular disease with claudication (HCC)    dx 2009 left popliteal occlusion with collaterals; 03- 2015  s/p  stenting right popliteal  by dr berry and left popliteal stenting by dr David [name] ([facility]) july 2015   S/P CABG x 5    09-10-1988   S/P drug eluting coronary stent placement 01/04/2006   01-04-2006--- DES to SVG to OM1, OM2, and IR, and SVG to PDA(x2 grafts)  and 03-04-2013  DES to SVR to OM   Wears glasses     Past Surgical History:  Procedure Laterality Date   ANAL FISSURE REPAIR  12-09-2000 and 1970's   CARDIAC CATHETERIZATION  04/09/2000   patent  grafts   CARDIAC PACEMAKER PLACEMENT  11-15-1993   dr lucas   CARDIOVASCULAR STRESS TEST  07-28-2015  dr crouitoru   Intermediate risk nuclear study w/ large, severe, predominantly fixed inferior and apical defect; minimal reversibility inferior lateral wall; findings consistent w/ large prior infarct and trivial peri-infarct ischemia;  ef 43% w/ inferior hypokinesis and apical akinesis; mild LVE   CORONARY ANGIOPLASTY WITH STENT PLACEMENT  01-04-2006  dr gamble   DES to pSVG to OM1, SVG to OM2 &  IR, and mSVG to RCA dPDA (x2 grafts );  pLAD , pLCFX and  mRCA occluded;  LIMA to LAD mid-distal patent with occlusion beyond LIMA with collateral flow;  ef 30%   CORONARY ARTERY BYPASS GRAFT  09/10/1988   dr tanda   CABG X5 (03/04/2013) LIMA to LAD,SVG to imtermediate OM1 OM II,SVG to PDA   CORONARY BALLOON ANGIOPLASTY N/A 03/05/2023   Procedure: CORONARY BALLOON ANGIOPLASTY;  Surgeon: Dann Candyce RAMAN, MD;  Location: [facility] INVASIVE CV LAB;  Service: Cardiovascular;  Laterality: N/A;   CYSTOSCOPY W/ RETROGRADES Bilateral 04/13/2016   Procedure: CYSTOSCOPY WITH RETROGRADE PYELOGRAM;  Surgeon: Ricardo Likens, MD;  Location: [facility];  Service: Urology;  Laterality: Bilateral;   EP IMPLANTABLE DEVICE N/A 11/29/2015   Procedure: Pacemaker Implant;  Surgeon: David Balding, MD;  Location: [facility] INVASIVE CV LAB;  Service: Cardiovascular;  Laterality: N/A;  new generator and new atrial, vertricular leads;  [employer] Accolade   LEFT HEART CATH AND CORS/GRAFTS ANGIOGRAPHY N/A 03/05/2023   Procedure: LEFT HEART CATH AND CORS/GRAFTS ANGIOGRAPHY;  Surgeon: Dann Candyce RAMAN, MD;  Location: [facility] INVASIVE CV LAB;  Service: Cardiovascular;  Laterality: N/A;   LEFT HEART CATHETERIZATION WITH CORONARY/GRAFT ANGIOGRAM N/A 03/04/2013   Procedure: LEFT HEART CATHETERIZATION WITH EL BILE;  Surgeon: Debby DELENA Sor, MD;  Location: [facility] CATH LAB;  Service: Cardiovascular;  Laterality: N/A;    LOWER EXTREMITY ANGIOGRAM N/A 08/31/2013   Procedure: LOWER EXTREMITY ANGIOGRAM;  Surgeon: David JINNY Lesches, MD;  Location: [facility] CATH LAB;  Service: Cardiovascular;  Laterality: N/A;   PACEMAKER REPLACEMENT  12/31/2000;  08-04-2008   PERCUTANEOUS CORONARY STENT INTERVENTION (PCI-S)  03/04/2013   Procedure: PERCUTANEOUS CORONARY STENT INTERVENTION (PCI-S);  Surgeon: Debby DELENA Sor, MD;  Location: [facility] CATH LAB;  Service: Cardiovascular;;  DES to S-OM for in-stent restenosis   POPLITEAL ARTERY STENT Right 10-21-2013  dr berry   atherectomy, PTA , and IDEV stent x1   POPLITEAL ARTERY STENT Left 07/ 2015  dr David [name] at [facility]   angioplasty / stent x2   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   SUPERFICIAL LYMPH NODE BIOPSY / EXCISION Left 2011   cervical lymph node   TRANSURETHRAL RESECTION OF BLADDER TUMOR N/A 04/13/2016   Procedure: TRANSURETHRAL RESECTION OF BLADDER TUMOR (TURBT) with clot evacuation;  Surgeon: Ricardo Likens, MD;  Location: [facility];  Service: Urology;  Laterality: N/A;   VENOGRAM Left 11/29/2015   Procedure: Venogram;  Surgeon: David Balding, MD;  Location: [facility] INVASIVE CV LAB;  Service: Cardiovascular;  Laterality: Left;    Outpatient Medications Prior to Visit  Medication Sig Dispense Refill   aspirin  EC 81 MG tablet Take 81 mg by mouth daily.     empagliflozin  (JARDIANCE ) 10 MG TABS tablet Take 1 tablet (10 mg total) by mouth daily. 90 tablet 3   isosorbide  mononitrate (IMDUR ) 30 MG 24 hr tablet TAKE 3 TABLETS BY MOUTH TWICE  DAILY 180 tablet 11   metoprolol  (TOPROL -XL) 200 MG 24 hr tablet Take 1 tablet (200 mg total) by mouth daily. Take with or immediately following a meal. 10 tablet 0   rosuvastatin  (CRESTOR ) 40 MG tablet TAKE 1 TABLET BY MOUTH AT  BEDTIME 90 tablet 3   sacubitril -valsartan  (ENTRESTO ) 24-26 MG Take 1 tablet by mouth 2 (two) times daily. Start taking 48 hours after stopping Lisinopril . 20 tablet 0   triamcinolone  cream  (KENALOG ) 0.1 % Apply 1 Application topically as needed.     VASCEPA  1 g capsule TAKE TWO CAPSULES BY MOUTH TWICE DAILY 360 capsule 0   nitroGLYCERIN  (NITROSTAT ) 0.4 MG SL tablet PLACE 1 TABLET UNDER THE TONGUE EVERY 5 MINUTES AS NEEDED FOR CHEST PAIN (Patient not taking: Reported on 03/26/2024) 25 tablet 10   ranolazine  (RANEXA ) 1000 MG SR tablet Take 1 tablet (1,000 mg total) by mouth 2 (two) times daily. 60 tablet 11   furosemide  (LASIX ) 40 MG tablet Take 1 tablet (40 mg total) by mouth daily as needed for fluid or edema (Only as needed for shortness of breath, fluid, and edema.). (Patient not taking: Reported on 03/26/2024) 20 tablet 0   No facility-administered medications prior to visit.     Allergies:   Azithromycin, Lactose intolerance (gi), Niacin and related, and Sulfa antibiotics     Family History:  The patient's family history includes Diabetes in his mother; Heart attack in his father; Hyperlipidemia in his sister and sister; Hypertension in his sister and sister; Stroke in his mother.     PHYSICAL EXAM:   VS:  BP (!) 100/50 (BP Location: Left Arm, Patient Position: Sitting, Cuff Size: Normal)   Pulse 67   Ht 5' 9 (1.753 m)   Wt 186 lb (84.4 kg)   SpO2 95%   BMI 27.47 kg/m      General: Alert, oriented x3, no distress, healthy left subclavian pacemaker site Head: no evidence of trauma, PERRL, EOMI, no exophtalmos or lid lag, no myxedema, no xanthelasma; normal ears, nose and oropharynx Neck: normal jugular venous pulsations and no hepatojugular reflux; brisk carotid pulses without delay and no carotid bruits Chest: clear to auscultation, no signs of consolidation by percussion or palpation, normal fremitus, symmetrical and full respiratory excursions Cardiovascular: normal position and quality of the apical impulse, regular rhythm, normal first and second heart sounds, no murmurs, rubs or gallops Abdomen: no tenderness or distention, no masses by palpation, no abnormal  pulsatility or arterial bruits, normal bowel sounds, no hepatosplenomegaly Extremities: no clubbing, cyanosis or edema; 2+ radial, ulnar and brachial pulses bilaterally; 2+ right femoral, posterior tibial and dorsalis pedis pulses; 2+  left femoral, posterior tibial and dorsalis pedis pulses; no subclavian or femoral bruits Neurological: grossly nonfocal Psych: Normal mood and affect     Wt Readings from Last 3 Encounters:  03/26/24 186 lb (84.4 kg)  02/17/24 187 lb 12.8 oz (85.2 kg)  01/03/24 189 lb 6.4 oz (85.9 kg)    Studies/Labs Reviewed:  Pharmacological nuclear stress test 01/18/2021 The left ventricular ejection fraction is moderately decreased (30-44%). Nuclear stress EF: 36%. There was no ST segment deviation noted during stress. Defect 1: There is a medium defect of severe severity present in the basal inferior, mid inferior, apical inferior, apical lateral and apex location. Findings consistent with prior myocardial infarction. This is an intermediate risk study due to reduced systolic function. There is no ischemia.   EKG: Personally reviewed the most recent tracing from 08/27/2023 shows AV sequential pacing with distinct positive R waves in leads V1 and V2  EKG Interpretation Date/Time:    Ventricular Rate:    PR Interval:    QRS Duration:    QT Interval:    QTC Calculation:   R Axis:      Text Interpretation:          Recent Labs: 01/03/2024: BUN 19; Creatinine, Ser 1.16; Potassium 4.6; Sodium 142   Lipid Panel  07/15/2020 Cholesterol 132, HDL 41, LDL 67, triglycerides 136, creatinine 1.38, potassium 4.5, normal liver function tests  08/09/2022 Cholesterol 142, HDL 46, LDL 66, triglycerides 178 Hemoglobin 14.1, creatinine 1.19, potassium 4.3, ALT 30, platelets 197 K  08/15/2023 Cholesterol 122, HDL 36, LDL 57, triglycerides 169 Hemoglobin 13.2, ALT 22  10/10/2023 Creatinine 1.19, potassium 4.9     Component Value Date/Time   CHOL 119 06/24/2014  0855   TRIG 185 (H) 06/24/2014 0855   HDL 35 (L) 06/24/2014 0855   CHOLHDL 3.4 06/24/2014 0855   VLDL 37 06/24/2014 0855   LDLCALC 47 06/24/2014 0855   ASSESSMENT:    1. Chronic combined systolic and diastolic heart failure (HCC)   2. Medication management   3. Coronary artery disease of autologous vein bypass graft with stable angina pectoris (HCC)   4. Complete heart block (HCC)   5. PAD (peripheral artery disease) (HCC)   6. NSVT (nonsustained ventricular tachycardia) (HCC)   7. Essential hypertension   8. Dyslipidemia            PLAN:  In order of problems listed above:  CHF: I think the episodes that he thinks might represent anxiety actually present orthopnea and paroxysmal nocturnal dyspnea.  Will have him take the furosemide  on a scheduled basis and also start spironolactone .  His blood pressure will limit dose of diuretic medications and does not allow titration of Entresto .  He is already on an SGLT2 inhibitor and a high dose of beta-blocker as well (we may decide to reduce the dose of beta-blocker if he remains angina free and his blood pressure does not allow diuretics).  Decreased EF is partly due to RV pacing induced dyssynchrony, but does not appear to be low enough to justify consideration of CRT.  Will check BMET and BNP today, before starting spironolactone , and recheck labs when he comes in for follow-up appointment in 3-4 weeks. CAD (CABG 1995, PCI SVG-OM 2007 and 2014):   Interestingly, his complaints of exertional chest pain improved when he started on SGLT2 inhibitor. It is possible that he has once again developed restenosis in the SVG-OM stent treated with plain balloon angioplasty in July 2024, but he is currently not  experiencing angina pectoris.  Also it is possible that he may still have some ischemic myocardium in the distribution of the right coronary artery, although perfusion studies have suggested that this is mostly scar.  No option for percutaneous  revascularization of the inferior wall.  Will continue with medical management for now. CHB: Pacemaker dependent, no escape rhythm Pacemaker: Normal device function.  Performing remote downloads every 3 months.  Despite the aggressive minute ventilation sensor settings his heart rate histogram remains quite blunted.  He has abandoned unipolar leads so he cannot have an MRI even though his current system is MRI conditional. PAD: He does not have intermittent claudication at this time.  Although he likely has some degree of vascular insufficiency, current complaints are more consistent with neurogenic claudication from spinal stenosis (he has pain in his legs just standing at the kitchen sink).  02/15/2021 ABI actually shows some improvement (right 0.90, left 1.11). PVCs/NSVT: Low burden recently.  In the past used to have relatively frequent nonsustained VT, but this has not been present in quite a while. SABRA   HTN: Blood pressure remains quite low.  May have to decrease the dose of metoprolol . Asymptomatic stenosis of the right carotid artery: He had a carotid Doppler done on 08/15/2023 that showed no change in the 60-79% right internal carotid artery stenosis but also suggested that hemodynamically significant obstruction at the level of the right common carotid.  There was only mild plaque on the left without significant obstruction. HLP: LDL within target range.  Chronically low HDL despite the fact that he is very lean. Lymphoma in remission  Medication Adjustments/Labs and Tests Ordered: Current medicines are reviewed at length with the patient today.  Concerns regarding medicines are outlined above.  Medication changes, Labs and Tests ordered today are listed in the Patient Instructions below. Patient Instructions  Medication Instructions:  Spironolactone  12.5 mg daily Furosemide  40 mg every other day *If you need a refill on your cardiac medications before your next appointment, please call your  pharmacy*  Lab Work: BMP and BNP If you have labs (blood work) drawn today and your tests are completely normal, you will receive your results only by: MyChart Message (if you have MyChart) OR A paper copy in the mail If you have any lab test that is abnormal or we need to change your treatment, we will call you to review the results.  Testing/Procedures: Please keep a daily weight log. Notify us  if you gain 2-3 pounds overnight or 5 pounds in one week.   Follow-Up: At [facility], you and your health needs are our priority.  As part of our continuing mission to provide you with exceptional heart care, our providers are all part of one team.  This team includes your primary Cardiologist (physician) and Advanced Practice Providers or APPs (Physician Assistants and Nurse Practitioners) who all work together to provide you with the care you need, when you need it.  Your next appointment:   APP- 3-4 weeks  Dr Francyne- 3 months  We recommend signing up for the patient portal called MyChart.  Sign up information is provided on this After Visit Summary.  MyChart is used to connect with patients for Virtual Visits (Telemedicine).  Patients are able to view lab/test results, encounter notes, upcoming appointments, etc.  Non-urgent messages can be sent to your provider as well.   To learn more about what you can do with MyChart, go to [email or web address].  Signed, David Balding, MD  03/26/2024 2:40 PM    [facility] [address] [place], [place], [place]  [address] Phone: [phone]; Fax: [phone]

## 2024-03-26 NOTE — Patient Instructions (Signed)
 Medication Instructions:  Spironolactone  12.5 mg daily Furosemide  40 mg every other day *If you need a refill on your cardiac medications before your next appointment, please call your pharmacy*  Lab Work: BMP and BNP If you have labs (blood work) drawn today and your tests are completely normal, you will receive your results only by: MyChart Message (if you have MyChart) OR A paper copy in the mail If you have any lab test that is abnormal or we need to change your treatment, we will call you to review the results.  Testing/Procedures: Please keep a daily weight log. Notify us  if you gain 2-3 pounds overnight or 5 pounds in one week.   Follow-Up: At [facility], you and your health needs are our priority.  As part of our continuing mission to provide you with exceptional heart care, our providers are all part of one team.  This team includes your primary Cardiologist (physician) and Advanced Practice Providers or APPs (Physician Assistants and Nurse Practitioners) who all work together to provide you with the care you need, when you need it.  Your next appointment:   APP- 3-4 weeks  Dr Francyne- 3 months  We recommend signing up for the patient portal called MyChart.  Sign up information is provided on this After Visit Summary.  MyChart is used to connect with patients for Virtual Visits (Telemedicine).  Patients are able to view lab/test results, encounter notes, upcoming appointments, etc.  Non-urgent messages can be sent to your provider as well.   To learn more about what you can do with MyChart, go to [email or web address].

## 2024-03-30 ENCOUNTER — Ambulatory Visit: Payer: Self-pay | Admitting: Cardiovascular Disease

## 2024-03-30 LAB — BASIC METABOLIC PANEL WITH GFR
BUN/Creatinine Ratio: 17 (ref 10–24)
BUN: 22 mg/dL (ref 8–27)
CO2: 21 mmol/L (ref 20–29)
Calcium: 8.8 mg/dL (ref 8.6–10.2)
Chloride: 106 mmol/L (ref 96–106)
Creatinine, Ser: 1.3 mg/dL — ABNORMAL HIGH (ref 0.76–1.27)
Glucose: 104 mg/dL — ABNORMAL HIGH (ref 70–99)
Potassium: 4.8 mmol/L (ref 3.5–5.2)
Sodium: 143 mmol/L (ref 134–144)
eGFR: 53 mL/min/1.73 — ABNORMAL LOW (ref >59)

## 2024-03-30 LAB — BRAIN NATRIURETIC PEPTIDE: BNP: 527.9 pg/mL — ABNORMAL HIGH (ref 0.0–100.0)

## 2024-03-31 ENCOUNTER — Ambulatory Visit (INDEPENDENT_AMBULATORY_CARE_PROVIDER_SITE_OTHER): Payer: Medicare Other

## 2024-03-31 DIAGNOSIS — I442 Atrioventricular block, complete: Secondary | ICD-10-CM

## 2024-04-01 LAB — CUP PACEART REMOTE DEVICE CHECK
Battery Remaining Longevity: 42 mo
Battery Remaining Percentage: 41 %
Brady Statistic RA Percent Paced: 92 %
Brady Statistic RV Percent Paced: 97 %
Date Time Interrogation Session: 20250826022100
Implantable Lead Connection Status: 753985
Implantable Lead Connection Status: 753985
Implantable Lead Implant Date: 20170425
Implantable Lead Implant Date: 20170425
Implantable Lead Location: 753859
Implantable Lead Location: 753860
Implantable Lead Model: 7741
Implantable Lead Model: 7742
Implantable Lead Serial Number: 720495
Implantable Lead Serial Number: 763888
Implantable Pulse Generator Implant Date: 20170425
Lead Channel Impedance Value: 653 Ohm
Lead Channel Impedance Value: 731 Ohm
Lead Channel Pacing Threshold Amplitude: 0.9 V
Lead Channel Pacing Threshold Pulse Width: 0.4 ms
Lead Channel Setting Pacing Amplitude: 2.5 V
Lead Channel Setting Pacing Amplitude: 3.5 V
Lead Channel Setting Pacing Pulse Width: 0.4 ms
Lead Channel Setting Sensing Sensitivity: 2.5 mV
Pulse Gen Serial Number: 718025
Zone Setting Status: 755011

## 2024-04-02 ENCOUNTER — Ambulatory Visit: Payer: Self-pay | Admitting: Cardiovascular Disease

## 2024-04-06 NOTE — Progress Notes (Unsigned)
 [facility] [place]  ASSESSMENT / PLAN: David [name] is a 89 y.o. male with asymptomatic right 80 - 99 % carotid artery stenosis.   Recommend:  Abstinence from all tobacco products. Blood glucose control with goal A1c < 7%. Blood pressure control with goal blood pressure < 140/90 mmHg. Lipid reduction therapy with goal LDL-C <100 mg/dL  Aspirin  81mg  PO QD.  Atorvastatin 40-80mg  PO QD (or other high intensity statin therapy).  The patient and I had a very good discussion about asymptomatic carotid artery stenosis.  I reviewed the general consensus that asymptomatic intervention may not be appropriate for elderly patients, even fairly fit ones like him.  He is personally not very interested in surgery, and would like to avoid it for the time being.  I think this is very reasonable.  I will see him again in 6 months with a carotid duplex.  CHIEF COMPLAINT: Carotid artery stenosis  HISTORY OF PRESENT ILLNESS: David [name] is a 89 y.o. male referred to clinic for evaluation of asymptomatic carotid artery stenosis.  The patient is a very pleasant elderly gentleman who appears younger than his stated age.  He has a fairly significant cardiac history having undergone coronary artery bypass grafting about 35 years ago.  He has ischemic cardiomyopathy which appears well compensated, complete heart block, intermittent claudication of the lower extremities, hypertension, hyperlipidemia.  He has been followed for the carotid artery stenosis by his cardiology team for some time.  He has thankfully never suffered a stroke or mini stroke.  We reviewed his CT angiogram in detail.  I reviewed the current signs regarding asymptomatic carotid artery stenosis and very elderly patients.  I reviewed the modest improvement in stroke risk delivered from asymptomatic intervention.  I counseled him that greatest benefit was seen in fairly young patients with severe carotid artery stenosis and good  life expectancy.  He and I agreed that asymptomatic intervention may be an appropriate for him given his advanced age and health challenges.  Past Medical History:  Diagnosis Date   Arthritis    hands (10/19/2013)   Bilateral carotid bruits 04/08/2020   Right greater than left   Bladder tumor    CAD (coronary artery disease)    cardiologist-  dr crouitoru   Carotid artery disease (HCC)    moderate pRICA 60-79%,  1-39% LICA , >50% RECA last duplex 08-05-2015   Cataract of both eyes    Complete heart block (HCC)    GERD (gastroesophageal reflux disease)    Glaucoma, both eyes    Hematuria    History of MI (myocardial infarction)    1989   Hyperlipidemia    Hypertension    Ischemic cardiomyopathy    ef 40-45%  without clinical heart failure per cardiology note 12-28-2015 (nuclear stress ef 43% from 07-28-2015)   Left ventricular dysfunction    Non Hodgkin's lymphoma (HCC) dx 06/2010---  oncologist-  dr cloretta---  in clinical remission since 2013   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 -- lymphadenopathy to neck, mediastinal , and abdominal--  chemotherapy completed 06/ 2011   NSVT (nonsustained ventricular tachycardia) [facility]) cardiologist-  dr crouitoru   Max rate 200 bpm, 28-beat, nearly 9-second episode recorded by pacemaker on November 15, asymptomatic    Pacemaker pacemaker dependant due to CHB   1st Pacemaker Placement 1998 w/ New generator  12-31-2000 ; 08-04-2008;  11-29-2015   PAD (peripheral artery disease) (HCC)    followed by  dr berry--- last duplex 08-05-2015  patents bilateral popliteal stents, bilateral SFA 30-49% stenosis   Peripheral vascular disease with claudication (HCC)    dx 2009 left popliteal occlusion with collaterals; 03- 2015  s/p  stenting right popliteal  by dr berry and left popliteal stenting by dr myra ([facility]) july 2015   S/P CABG x 5    09-10-1988   S/P drug eluting coronary stent placement 01/04/2006   01-04-2006---  DES to SVG to OM1, OM2, and IR, and SVG to PDA(x2 grafts)  and 03-04-2013  DES to SVR to OM   Wears glasses     Past Surgical History:  Procedure Laterality Date   ANAL FISSURE REPAIR  12-09-2000 and 1970's   CARDIAC CATHETERIZATION  04/09/2000   patent grafts   CARDIAC PACEMAKER PLACEMENT  11-15-1993   dr lucas   CARDIOVASCULAR STRESS TEST  07-28-2015  dr crouitoru   Intermediate risk nuclear study w/ large, severe, predominantly fixed inferior and apical defect; minimal reversibility inferior lateral wall; findings consistent w/ large prior infarct and trivial peri-infarct ischemia;  ef 43% w/ inferior hypokinesis and apical akinesis; mild LVE   CORONARY ANGIOPLASTY WITH STENT PLACEMENT  01-04-2006  dr gamble   DES to pSVG to OM1, SVG to OM2 &  IR, and mSVG to RCA dPDA (x2 grafts );  pLAD , pLCFX and  mRCA occluded;  LIMA to LAD mid-distal patent with occlusion beyond LIMA with collateral flow;  ef 30%   CORONARY ARTERY BYPASS GRAFT  09/10/1988   dr tanda   CABG X5 (03/04/2013) LIMA to LAD,SVG to imtermediate OM1 OM II,SVG to PDA   CORONARY BALLOON ANGIOPLASTY N/A 03/05/2023   Procedure: CORONARY BALLOON ANGIOPLASTY;  Surgeon: Dann Candyce RAMAN, MD;  Location: [facility] INVASIVE CV LAB;  Service: Cardiovascular;  Laterality: N/A;   CYSTOSCOPY W/ RETROGRADES Bilateral 04/13/2016   Procedure: CYSTOSCOPY WITH RETROGRADE PYELOGRAM;  Surgeon: Ricardo Likens, MD;  Location[facility];  Service: Urology;  Laterality: Bilateral;   EP IMPLANTABLE DEVICE N/A 11/29/2015   Procedure: Pacemaker Implant;  Surgeon: Jerel Balding, MD;  Location: [facility] INVASIVE CV LAB;  Service: Cardiovascular;  Laterality: N/A;  new generator and new atrial, vertricular leads;  [employer] Accolade   LEFT HEART CATH AND CORS/GRAFTS ANGIOGRAPHY N/A 03/05/2023   Procedure: LEFT HEART CATH AND CORS/GRAFTS ANGIOGRAPHY;  Surgeon: Dann Candyce RAMAN, MD;  Location: [facility] INVASIVE CV LAB;  Service: Cardiovascular;   Laterality: N/A;   LEFT HEART CATHETERIZATION WITH CORONARY/GRAFT ANGIOGRAM N/A 03/04/2013   Procedure: LEFT HEART CATHETERIZATION WITH EL BILE;  Surgeon: Debby DELENA Sor, MD;  Location: [facility] CATH LAB;  Service: Cardiovascular;  Laterality: N/A;   LOWER EXTREMITY ANGIOGRAM N/A 08/31/2013   Procedure: LOWER EXTREMITY ANGIOGRAM;  Surgeon: Dorn JINNY Lesches, MD;  Location: [facility] CATH LAB;  Service: Cardiovascular;  Laterality: N/A;   PACEMAKER REPLACEMENT  12/31/2000;  08-04-2008   PERCUTANEOUS CORONARY STENT INTERVENTION (PCI-S)  03/04/2013   Procedure: PERCUTANEOUS CORONARY STENT INTERVENTION (PCI-S);  Surgeon: Debby DELENA Sor, MD;  Location: [facility] CATH LAB;  Service: Cardiovascular;;  DES to S-OM for in-stent restenosis   POPLITEAL ARTERY STENT Right 10-21-2013  dr berry   atherectomy, PTA , and IDEV stent x1   POPLITEAL ARTERY STENT Left 07/ 2015  dr myra at [facility]   angioplasty / stent x2   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   SUPERFICIAL LYMPH NODE BIOPSY / EXCISION Left 2011   cervical lymph node  TRANSURETHRAL RESECTION OF BLADDER TUMOR N/A 04/13/2016   Procedure: TRANSURETHRAL RESECTION OF BLADDER TUMOR (TURBT) with clot evacuation;  Surgeon: Ricardo Likens, MD;  Location: [facility];  Service: Urology;  Laterality: N/A;   VENOGRAM Left 11/29/2015   Procedure: Venogram;  Surgeon: Jerel Balding, MD;  Location: [facility] INVASIVE CV LAB;  Service: Cardiovascular;  Laterality: Left;    Family History  Problem Relation Age of Onset   Diabetes Mother    Stroke Mother    Heart attack Father    Hypertension Sister    Hyperlipidemia Sister    Hypertension Sister    Hyperlipidemia Sister     Social History   Socioeconomic History   Marital status: Married    Spouse name: Not on file   Number of children: Not on file   Years of education: Not on file   Highest education level: Not on file  Occupational History   Not on file  Tobacco Use    Smoking status: Former    Current packs/day: 0.00    Average packs/day: 0.1 packs/day for 1 year (0.1 ttl pk-yrs)    Types: Cigarettes    Start date: 08/07/1955    Quit date: 08/06/1956    Years since quitting: 67.7   Smokeless tobacco: Never  Substance and Sexual Activity   Alcohol  use: No   Drug use: No   Sexual activity: Never  Other Topics Concern   Not on file  Social History Narrative   Not on file   Social Drivers of Health   Financial Resource Strain: Not on file  Food Insecurity: Not on file  Transportation Needs: Not on file  Physical Activity: Not on file  Stress: Not on file  Social Connections: Not on file  Intimate Partner Violence: Not on file    Allergies  Allergen Reactions   Azithromycin Diarrhea   Lactose Intolerance (Gi) Diarrhea   Niacin And Related Diarrhea   Sulfa Antibiotics Rash    Current Outpatient Medications  Medication Sig Dispense Refill   aspirin  EC 81 MG tablet Take 81 mg by mouth daily.     empagliflozin  (JARDIANCE ) 10 MG TABS tablet Take 1 tablet (10 mg total) by mouth daily. 90 tablet 3   furosemide  (LASIX ) 40 MG tablet Take 1 tablet (40 mg total) by mouth every other day. 75 tablet 3   isosorbide  mononitrate (IMDUR ) 30 MG 24 hr tablet TAKE 3 TABLETS BY MOUTH TWICE  DAILY 180 tablet 11   metoprolol  (TOPROL -XL) 200 MG 24 hr tablet Take 1 tablet (200 mg total) by mouth daily. Take with or immediately following a meal. 10 tablet 0   nitroGLYCERIN  (NITROSTAT ) 0.4 MG SL tablet PLACE 1 TABLET UNDER THE TONGUE EVERY 5 MINUTES AS NEEDED FOR CHEST PAIN (Patient not taking: Reported on 03/26/2024) 25 tablet 10   rosuvastatin  (CRESTOR ) 40 MG tablet TAKE 1 TABLET BY MOUTH AT  BEDTIME 90 tablet 3   sacubitril -valsartan  (ENTRESTO ) 24-26 MG Take 1 tablet by mouth 2 (two) times daily. Start taking 48 hours after stopping Lisinopril . 20 tablet 0   spironolactone  (ALDACTONE ) 25 MG tablet Take 0.5 tablets (12.5 mg total) by mouth daily. 45 tablet 3    triamcinolone  cream (KENALOG ) 0.1 % Apply 1 Application topically as needed.     VASCEPA  1 g capsule TAKE TWO CAPSULES BY MOUTH TWICE DAILY 360 capsule 0   No current facility-administered medications for this visit.    PHYSICAL EXAM There were no vitals filed for this visit.  Well-appearing elderly man in no acute distress Regular rate and rhythm Unlabored breathing Normal gait and station No cranial nerve abnormalities Moves all extremities freely  PERTINENT LABORATORY AND RADIOLOGIC DATA  Most recent CBC    Latest Ref Rng & Units 02/28/2023   11:33 AM 07/15/2021   11:16 AM 02/15/2020   11:11 AM  CBC  WBC 3.4 - 10.8 x10E3/uL 5.7  6.9  5.6   Hemoglobin 13.0 - 17.7 g/dL 87.2  86.4  87.2   Hematocrit 37.5 - 51.0 % 36.5  41.5  37.1   Platelets 150 - 450 x10E3/uL 134  197  152      Most recent CMP    Latest Ref Rng & Units 03/26/2024   10:30 AM 01/03/2024   11:09 AM 10/10/2023   11:37 AM  CMP  Glucose 70 - 99 mg/dL 895  886  896   BUN 8 - 27 mg/dL 22  19  18    Creatinine 0.76 - 1.27 mg/dL 8.69  8.83  8.80   Sodium 134 - 144 mmol/L 143  142  141   Potassium 3.5 - 5.2 mmol/L 4.8  4.6  4.9   Chloride 96 - 106 mmol/L 106  101  103   CO2 20 - 29 mmol/L 21  23  25    Calcium  8.6 - 10.2 mg/dL 8.8  9.2  9.4     Renal function Estimated Creatinine Clearance: 38.5 mL/min (A) (by C-G formula based on SCr of 1.3 mg/dL (H)).  No results found for: HGBA1C  LDL Cholesterol  Date Value Ref Range Status  06/24/2014 47 0 - 99 mg/dL Final    Comment:      Total Cholesterol/HDL Ratio:CHD Risk                        Coronary Heart Disease Risk Table                                        Men       Women          1/2 Average Risk              3.4        3.3              Average Risk              5.0        4.4           2X Average Risk              9.6        7.1           3X Average Risk             23.4       11.0 Use the calculated Patient Ratio above and the CHD Risk table   to determine the patient's CHD Risk. ATP III Classification (LDL):       < 100        mg/dL         Optimal      899 - 129     mg/dL         Near or Above Optimal      130 - 159     mg/dL  Borderline High      160 - 189     mg/dL         High       > 809        mg/dL         Very High       CT angiogram of the neck 09/06/2023.  Personally reviewed in detail Right carotid artery bifurcation as significant stenosis (likely greater than 80%) with bulky calcification.  The lesion is very focal and would be fairly straightforward to treat with carotid endarterectomy.  I do not think a TCAR would be reasonable given the calcification.  Debby SAILOR. Magda, MD [facility] Phone Number: [phone] 04/06/2024 5:35 PM   Total time spent on preparing this encounter including chart review, data review, collecting history, examining the patient, coordinating care for this new patient, 60 minutes.  Portions of this report may have been transcribed using voice recognition software.  Every effort has been made to ensure accuracy; however, inadvertent computerized transcription errors may still be present.

## 2024-04-07 ENCOUNTER — Ambulatory Visit (INDEPENDENT_AMBULATORY_CARE_PROVIDER_SITE_OTHER): Admitting: Vascular Surgery

## 2024-04-07 ENCOUNTER — Ambulatory Visit (HOSPITAL_COMMUNITY)
Admission: RE | Admit: 2024-04-07 | Discharge: 2024-04-07 | Disposition: A | Discharge status: Home / Self Care | Source: Ambulatory Visit | Attending: Vascular Surgery | Admitting: Vascular Surgery

## 2024-04-07 ENCOUNTER — Encounter: Payer: Self-pay | Admitting: Vascular Surgery

## 2024-04-07 VITALS — BP 106/64 | HR 59 | Temp 98.0°F | Ht 69.0 in | Wt 187.0 lb

## 2024-04-07 DIAGNOSIS — I6521 Occlusion and stenosis of right carotid artery: Secondary | ICD-10-CM | POA: Insufficient documentation

## 2024-04-07 DIAGNOSIS — I6523 Occlusion and stenosis of bilateral carotid arteries: Secondary | ICD-10-CM | POA: Diagnosis not present

## 2024-04-09 ENCOUNTER — Other Ambulatory Visit: Payer: Self-pay | Admitting: *Deleted

## 2024-04-09 DIAGNOSIS — I6523 Occlusion and stenosis of bilateral carotid arteries: Secondary | ICD-10-CM

## 2024-04-15 ENCOUNTER — Telehealth: Payer: Self-pay

## 2024-04-15 NOTE — Telephone Encounter (Signed)
 Discussed BSC  Advisory for patient's device and explained that it needs software update to prevent the possibility of the device from going into a SAFE MODE once it reaches less than 4 years on the battery life.   Patient verbalizes understanding and agrees to come to the clinic on 04/17/24 at 850am for update.

## 2024-04-16 NOTE — Progress Notes (Deleted)
 Cardiology Office Note   Date:  04/16/2024  ID:  David [name], DOB 04-02-1933, MRN [id number] PCP: Loreli Kins, MD  [facility] Providers Cardiologist:  Jerel Balding, MD   History of Present Illness David [name] is a 89 y.o. male with a past medical history of   CAD CABG x 5 09/10/1988: LIMA to LAD, SVG to PDA PL, sequential SVG to RI, OM1 and OM 2. LHC 04/09/2000 (angina): Severe native CAD.  Mid LAD 75%.  Mid RCA 100%.  Proximal LCx 100%.  Patent grafts. LHC 01/04/2006 (angina): Proximal 100%, proximal LCx 100%, mid RCA 100%.  Patent LIMA with mid to distal LAD occlusion beyond LIMA with retrograde collateral flow from RCA collaterals.  PCI with stent to SVG to PDA, stent to SVG to OM1/OM 2/RI. LHC 03/04/2013 (high risk nuclear stress test): Patent LIMA to LAD.  Patent SVG to PDA with occluded sequential limb to PLA.  SVG to OM1 and OM 2 with 90% mid in-stent restenosis and 90% stenosis distal to previously placed stent.  PCI with Cutting Balloon angioplasty and DES to SVG to OM1 and OM 2. LHC 03/05/2023 (abnormal PET stress): Patent LIMA to LAD.  SVG to RCA occluded.  Proximal graft lesion 100%.  Proximal graft lesion before OM1 20%.  OM graft in-stent lesion-mid graft lesion before OM1 90%.  PTCA of calcified lesion in prior stent (2014) and SVG to OM jump graft.  Unable to fully expand lesion. Ischemic cardiomyopathy. Echo 02/23/2021: EF 45 to 50%.  Apical akinesis.  Grade I DD.  Elevated left atrial pressure.  Normal RV function.  Trivial MR.  Mild to moderate aortic valve sclerosis/calcification without stenosis. Echo 08/28/2023 EF 45 to 50% LV with mildly decreased function, LV internal cavity mildly dilated, grade 2 diastolic dysfunction, RV systolic function and size was normal, normal pulmonary artery pressures.  No significant change from prior study Complete heart block. S/p PPM placement 1995. GEN change 2002 and 2009. Lead revision 2017. Remote check in 03/2024 showed  that patient was pacemaker dependent. Lead measurements stable. Batter status good.  Carotid artery disease. Carotid duplex 04/07/24 showed 60-79% stenosis in the right ICA, 1-39% stenosis in the left ICA. Followed by vascular surgery  PVD. PV angiogram 08/31/2013: Unsuccessful PTA attempt CTO left popliteal artery. PV angiogram 10/19/2013: Successful atherectomy, PTA and stenting right popliteal artery. Vascular ultrasound lower extremity/ABI 02/15/2022: Moderate bilateral lower extremity arterial disease.  Right: Moderate progression compared to prior study.  Extensive calcifications throughout.  Distal SFA now 50 to 74%.  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  Patent popliteal artery stent without evidence of stenosis.  Left: Moderate progression compared to prior study.  Extensive calcifications throughout.  Distal SFA now 50 to 74%.  Known left popliteal artery to TPT stent occlusion.  Native TPT occlusion. Non-Hodgkin's lymphoma.  Patient was seen by Dr. Balding on 03/26/24. At that time, he reported episodes of exertional dyspnea. He also had episodes of anxiety, which actually sounded like PND. No lower extremity swelling or weight gain so he had not been taking lasix . He was instructed to take lasix  and started spironolactone .   Ischemic Cardiomyopathy  HFmrEF  - Echocardiogram 08/2023 showed EF 45-50%, grade II DD, normal RV systolic function, mild MR - When patient was seen 8/21, he had DOE and PND. Instructed to take lasix  40 mg every other day. Also started on spironolactone  12.5 mg daily  -  - Ordered BMP for medication monitoring  - Continue  jardiance  10 mg daily, imdur  90 mg daily, metoprolol  succinate 200 mg daily, entresto  24-26 mg BID, spironolactone  12.5 mg daily   CAD  - Most recent cath 03/05/2023: Patent LIMA to LAD.  SVG to RCA occluded.  Proximal graft lesion 100%.  Proximal graft lesion before OM1 20%.  OM graft in-stent lesion-mid graft lesion before OM1  90%.  PTCA of calcified lesion in prior stent (2014) and SVG to OM jump graft.  Unable to fully expand lesion. - Continue ASA 81 mg daily  - Continue metoprolol  succinate 200 mg daily  - Continue crestor  40 mg daily   CHB  S/p PPM  - PPM dependent - Device followed by Dr. Francyne. Continue remote downloads every 3 months  - Patient cannot have MRI due to abandoned unipolar leads   PAD Carotid Artery disease  - Patient has asymptomatic stenosis of the R ICA. Carotid duplex 04/07/24 showed 60-79% stenosis in the right ICA, 1-39% stenosis in the left ICA. - Followed by vascular surgery. No plans for surgery/intervention at this time as he has been asymptomatic   PVCs  - Previously had a higher burden of NSVT. Per Dr. JAYSON and device interrogations, has had low burden recently  - Continue metoprolol  succinate 200 mg daily   HTN   HLD   ROS: ***  Studies Reviewed      *** Risk Assessment/Calculations {Does this patient have ATRIAL FIBRILLATION?[phone]} No BP recorded.  {Refresh Note OR Click here to enter BP  :1}***       Physical Exam VS:  There were no vitals taken for this visit.       Wt Readings from Last 3 Encounters:  04/07/24 187 lb (84.8 kg)  03/26/24 186 lb (84.4 kg)  02/17/24 187 lb 12.8 oz (85.2 kg)    GEN: Well nourished, well developed in no acute distress NECK: No JVD; No carotid bruits CARDIAC: ***RRR, no murmurs, rubs, gallops RESPIRATORY:  Clear to auscultation without rales, wheezing or rhonchi  ABDOMEN: Soft, non-tender, non-distended EXTREMITIES:  No edema; No deformity   ASSESSMENT AND PLAN ***    {Are you ordering a CV Procedure (e.g. stress test, cath, DCCV, TEE, etc)?   Press F2        :[id number]}  Dispo: ***  Signed, Rollo FABIENE Louder, PA-C

## 2024-04-17 ENCOUNTER — Ambulatory Visit: Attending: Cardiology

## 2024-04-17 DIAGNOSIS — I442 Atrioventricular block, complete: Secondary | ICD-10-CM

## 2024-04-17 LAB — CUP PACEART INCLINIC DEVICE CHECK
Date Time Interrogation Session: 20250912085507
Implantable Lead Connection Status: 753985
Implantable Lead Connection Status: 753985
Implantable Lead Implant Date: 20170425
Implantable Lead Implant Date: 20170425
Implantable Lead Location: 753859
Implantable Lead Location: 753860
Implantable Lead Model: 7741
Implantable Lead Model: 7742
Implantable Lead Serial Number: 720495
Implantable Lead Serial Number: 763888
Implantable Pulse Generator Implant Date: 20170425
Pulse Gen Serial Number: 718025

## 2024-04-17 NOTE — Patient Instructions (Signed)
Follow up per recall. 

## 2024-04-17 NOTE — Progress Notes (Signed)
 BS software upgrade completed.

## 2024-04-22 DIAGNOSIS — H6980 Other specified disorders of Eustachian tube, unspecified ear: Secondary | ICD-10-CM | POA: Diagnosis not present

## 2024-04-22 DIAGNOSIS — R490 Dysphonia: Secondary | ICD-10-CM | POA: Diagnosis not present

## 2024-04-22 NOTE — Progress Notes (Signed)
 Remote PPM Transmission

## 2024-04-24 ENCOUNTER — Encounter (INDEPENDENT_AMBULATORY_CARE_PROVIDER_SITE_OTHER): Payer: Self-pay

## 2024-04-27 ENCOUNTER — Ambulatory Visit: Admitting: Cardiology

## 2024-04-30 ENCOUNTER — Ambulatory Visit: Attending: Physician Assistant | Admitting: Physician Assistant

## 2024-04-30 VITALS — BP 98/48 | HR 63 | Ht 69.0 in | Wt 188.0 lb

## 2024-04-30 DIAGNOSIS — I442 Atrioventricular block, complete: Secondary | ICD-10-CM | POA: Insufficient documentation

## 2024-04-30 DIAGNOSIS — I25810 Atherosclerosis of coronary artery bypass graft(s) without angina pectoris: Secondary | ICD-10-CM | POA: Diagnosis not present

## 2024-04-30 DIAGNOSIS — I6523 Occlusion and stenosis of bilateral carotid arteries: Secondary | ICD-10-CM | POA: Diagnosis not present

## 2024-04-30 DIAGNOSIS — E782 Mixed hyperlipidemia: Secondary | ICD-10-CM | POA: Diagnosis not present

## 2024-04-30 DIAGNOSIS — I5022 Chronic systolic (congestive) heart failure: Secondary | ICD-10-CM | POA: Insufficient documentation

## 2024-04-30 DIAGNOSIS — I10 Essential (primary) hypertension: Secondary | ICD-10-CM | POA: Diagnosis not present

## 2024-04-30 DIAGNOSIS — E785 Hyperlipidemia, unspecified: Secondary | ICD-10-CM | POA: Diagnosis not present

## 2024-04-30 NOTE — Patient Instructions (Addendum)
 Medication Instructions:  NO CHANGES.  Lab Work: Nutritional therapist AND BNP TO BE DONE TODAY.  Testing/Procedures: NONE  Follow-Up: At [facility], you and your health needs are our priority.  As part of our continuing mission to provide you with exceptional heart care, our providers are all part of one team.  This team includes your primary Cardiologist (physician) and Advanced Practice Providers or APPs (Physician Assistants and Nurse Practitioners) who all work together to provide you with the care you need, when you need it.  Your next appointment:   KEEP APPOINTMENT WITH DR. C (06-26-24 AT 8:20 AM)  Provider:   Jerel Balding, MD     Other Instructions: PLEASE CALL OUR OFFICE IF THE CHEST PAIN WORSENS.

## 2024-04-30 NOTE — Progress Notes (Unsigned)
 Cardiology Office Note   Date:  04/30/2024  ID:  QUINCE SANTANA, DOB Aug 30, 1932, MRN [id number] PCP: Loreli Kins, MD  [facility] Providers Cardiologist:  Jerel Balding, MD { Click to update primary MD,subspecialty MD or APP then REFRESH:1}    History of Present Illness David [name] is a 89 y.o. male with past medical history of non-Hodgkin's lymphoma with a large neck mass and mediastinal/abdominal lymphadenopathy treated with radiotherapy to the cervical area 2011 and the chemotherapy, bladder tumor s/p cystoscopic resection, CHB s/p [employer] PPM (not MRI conditional due to old abandoned unipolar leads), CAD, ischemic cardiomyopathy/HFmrEF, PAD was occluded popliteal stents in left lower extremity, carotid artery disease, symptomatic PVCs, hypertension and hyperlipidemia.  He had occlusion of the left popliteal artery with distal reconstitution via collaterals, he underwent successful recanalization with 2 stents by Dr. Myra in 2015.  Although he had a significant improvement in his claudication, however the stent subsequently occluded.  Claudication improved with Pletal .  He is pacemaker dependent and had a generator change out in 2017.  He has underwent numerous coronary artery revascularization procedures.  Most recent cardiac catheterization performed on 03/05/2023 showed patent LIMA to LAD, occluded SVG-RCA, 90% lesion in SVG to OM.  Angioplasty was performed without placement of a new stent, the lesion could not be fully expanded with balloon angioplasty and recommendation was for 6 months of dual antiplatelet therapy.  Dr. Dann who performed the cardiac catheterization also recommended to consider shockwave therapy to help open up the blockage if patient has recurrent angina.  Carotid Doppler in January 2025 showed no significant change in the 60 to 79% right ICA stenosis, however may have hemodynamically significant obstruction at the level of the right common carotid  artery.  There was only mild disease on the left side.  Echocardiogram in January 2025 showed EF 45 to 50%, apical akinesis, no significant valvular issue.  GDMT has been limited by low blood pressure although he was tolerating Entresto .  Patient was most recently seen by Dr. Balding on 03/26/2024, device interrogation at the time showed normal device function with estimated generator longevity 4 years.  He has not had any A-fib or ventricular tachycardia however had a rare episode of paroxysmal atrial tachycardia up to 8 seconds.  He was placed on Lasix  on a scheduled basis due to concern for orthopnea and paroxysmal nocturnal dyspnea.  He was unable to tolerate Ranexa  due to severe constipation side effect that occurs even with a lower dose.  Since the last visit, patient had repeat carotid Doppler on 04/07/2024 and was seen by Dr. Magda of vascular surgery.  He has a 80% right carotid artery bifurcation lesion.  Given the asymptomatic nature, it was decided to hold off on treating the right carotid artery disease.  Patient presents today for follow-up.  His blood pressure is borderline low today after the addition of spironolactone  and a scheduled dose of Lasix .  He denies any dizziness or fatigue.  He continued to have 4-5 episodes of chest discomfort walking short distance or taking a shower.  He says after the previous balloon angioplasty last year, he has significant improvement in his symptom but feels the symptoms worsened this year.  He says the symptom never completely went away but was a low-grade for a while after the balloon angioplasty.  I had a discussion with the patient to see if he would be interested in repeat cardiac catheterization and potentially shockwave therapy to fully open up the blockage  in the SVG-OM, however he wished to defer the invasive procedure for now as he says it is not interfering with his quality of life.  I will obtain a basic metabolic panel and BNP today.  He has  follow-up with Dr. Francyne in 2 months.  ROS: ***  Studies Reviewed      *** Risk Assessment/Calculations {Does this patient have ATRIAL FIBRILLATION?:[phone]}         Physical Exam VS:  BP (!) 98/48 (BP Location: Left Arm, Patient Position: Sitting, Cuff Size: Normal)   Pulse 63   Ht 5' 9 (1.753 m)   Wt 188 lb (85.3 kg)   SpO2 95%   BMI 27.76 kg/m        Wt Readings from Last 3 Encounters:  04/30/24 188 lb (85.3 kg)  04/07/24 187 lb (84.8 kg)  03/26/24 186 lb (84.4 kg)    GEN: Well nourished, well developed in no acute distress NECK: No JVD; No carotid bruits CARDIAC: ***RRR, no murmurs, rubs, gallops RESPIRATORY:  Clear to auscultation without rales, wheezing or rhonchi  ABDOMEN: Soft, non-tender, non-distended EXTREMITIES:  No edema; No deformity   ASSESSMENT AND PLAN ***    {Are you ordering a CV Procedure (e.g. stress test, cath, DCCV, TEE, etc)?   Press F2        :[id number]}  Dispo: ***  Signed, Scot Ford, PA

## 2024-05-01 ENCOUNTER — Ambulatory Visit: Payer: Self-pay | Admitting: Physician Assistant

## 2024-05-01 LAB — BASIC METABOLIC PANEL WITH GFR
BUN/Creatinine Ratio: 20 (ref 10–24)
BUN: 26 mg/dL (ref 8–27)
CO2: 23 mmol/L (ref 20–29)
Calcium: 9.1 mg/dL (ref 8.6–10.2)
Chloride: 100 mmol/L (ref 96–106)
Creatinine, Ser: 1.31 mg/dL — ABNORMAL HIGH (ref 0.76–1.27)
Glucose: 77 mg/dL (ref 70–99)
Potassium: 5.1 mmol/L (ref 3.5–5.2)
Sodium: 140 mmol/L (ref 134–144)
eGFR: 52 mL/min/1.73 — ABNORMAL LOW (ref >59)

## 2024-05-01 LAB — BRAIN NATRIURETIC PEPTIDE: BNP: 259.9 pg/mL — ABNORMAL HIGH (ref 0.0–100.0)

## 2024-05-04 NOTE — Progress Notes (Signed)
 BNP significantly improved, which indicate improved volume level after scheduled lasix  and spironolactone , recommend continue diuretic as prescribed.

## 2024-05-05 ENCOUNTER — Other Ambulatory Visit: Payer: Self-pay | Admitting: Cardiovascular Disease

## 2024-05-06 ENCOUNTER — Ambulatory Visit (INDEPENDENT_AMBULATORY_CARE_PROVIDER_SITE_OTHER)

## 2024-05-06 VITALS — BP 104/64 | HR 60 | Ht 69.0 in | Wt 185.0 lb

## 2024-05-06 DIAGNOSIS — J329 Chronic sinusitis, unspecified: Secondary | ICD-10-CM | POA: Diagnosis not present

## 2024-05-06 DIAGNOSIS — R490 Dysphonia: Secondary | ICD-10-CM | POA: Diagnosis not present

## 2024-05-06 DIAGNOSIS — J3489 Other specified disorders of nose and nasal sinuses: Secondary | ICD-10-CM | POA: Diagnosis not present

## 2024-05-06 DIAGNOSIS — J383 Other diseases of vocal cords: Secondary | ICD-10-CM | POA: Diagnosis not present

## 2024-05-06 DIAGNOSIS — J324 Chronic pansinusitis: Secondary | ICD-10-CM

## 2024-05-06 DIAGNOSIS — J343 Hypertrophy of nasal turbinates: Secondary | ICD-10-CM

## 2024-05-06 DIAGNOSIS — R0981 Nasal congestion: Secondary | ICD-10-CM | POA: Diagnosis not present

## 2024-05-06 MED ORDER — FLUTICASONE PROPIONATE 50 MCG/ACT NA SUSP: 2.0000 | Freq: Every day | NASAL | 6 refills | Status: DC

## 2024-05-06 NOTE — Patient Instructions (Signed)
 I have ordered an imaging study for you to complete prior to your next visit. Please call [facility] Scheduling at [phone] to schedule your imaging if you have not received a call within 24 hours. If you are unable to complete your imaging study prior to your next scheduled visit please call our office to let us  know.

## 2024-05-06 NOTE — Progress Notes (Addendum)
 Dear Dr. Loreli, Here is my assessment for our mutual patient, David [name]. Thank you for allowing me the opportunity to care for your patient. Please do not hesitate to contact me should you have any other questions. Sincerely, Dr. Hadassah Parody  Otolaryngology Clinic Note Referring provider: Dr. Loreli HPI:  David [name] is a 89 y.o. male kindly referred by Dr. Loreli for evaluation of nasal congestion and dysphonia.   Hoarseness has been getting worse over the years. Used to sing in the choir.   A couple of months ago the ears and the sinuses felt plugged up.  Currently feels like he can breathe through nose but feels congested in his head. Feels pressure in sinuses and feels like sinuses are blocked. No prior sinus surgery. He has tried antihistamine, nasal saline and atrovent without improvement. The nasal congestion and pressure is bothersome. Flonase has been used in the past but not daily or for extended period. No recent sinus infections.   He does have some dyspnea on exertion (hx of CHF and working with cardiology).   H&N Surgery: left parotidectomy 20 years; benign etiology per patient.   PMH:  - CHF - Non-hodgkin lymphoma - HTN  - GERD  Independent Review of Additional Tests or Records:  Referral note 04/22/24 from Suzen Loreli, MD reviewed: pt with ongoing sinus congestion despite medical management    PMH/Meds/All/SocHx/FamHx/ROS:   Past Medical History:  Diagnosis Date   Arthritis    hands (10/19/2013)   Bilateral carotid bruits 04/08/2020   Right greater than left   Bladder tumor    CAD (coronary artery disease)    cardiologist-  dr crouitoru   Carotid artery disease    moderate pRICA 60-79%,  1-39% LICA , >50% RECA last duplex 08-05-2015   Cataract of both eyes    Complete heart block (HCC)    GERD (gastroesophageal reflux disease)    Glaucoma, both eyes    Hematuria    History of MI (myocardial infarction)    1989   Hyperlipidemia    Hypertension     Ischemic cardiomyopathy    ef 40-45%  without clinical heart failure per cardiology note 12-28-2015 (nuclear stress ef 43% from 07-28-2015)   Left ventricular dysfunction    Non Hodgkin's lymphoma (HCC) dx 06/2010---  oncologist-  dr cloretta---  in clinical remission since 2013   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 -- lymphadenopathy to neck, mediastinal , and abdominal--  chemotherapy completed 06/ 2011   NSVT (nonsustained ventricular tachycardia) [facility] cardiologist-  dr crouitoru   Max rate 200 bpm, 28-beat, nearly 9-second episode recorded by pacemaker on November 15, asymptomatic    Pacemaker pacemaker dependant due to CHB   1st Pacemaker Placement 1998 w/ New generator  12-31-2000 ; 08-04-2008;  11-29-2015   PAD (peripheral artery disease)    followed by dr berry--- last duplex 08-05-2015  patents bilateral popliteal stents, bilateral SFA 30-49% stenosis   Peripheral vascular disease with claudication    dx 2009 left popliteal occlusion with collaterals; 03- 2015  s/p  stenting right popliteal  by dr berry and left popliteal stenting by dr myra ([facility]) july 2015   S/P CABG x 5    09-10-1988   S/P drug eluting coronary stent placement 01/04/2006   01-04-2006--- DES to SVG to OM1, OM2, and IR, and SVG to PDA(x2 grafts)  and 03-04-2013  DES to SVR to OM   Wears glasses      Past Surgical  History:  Procedure Laterality Date   ANAL FISSURE REPAIR  12-09-2000 and 1970's   CARDIAC CATHETERIZATION  04/09/2000   patent grafts   CARDIAC PACEMAKER PLACEMENT  11-15-1993   dr lucas   CARDIOVASCULAR STRESS TEST  07-28-2015  dr crouitoru   Intermediate risk nuclear study w/ large, severe, predominantly fixed inferior and apical defect; minimal reversibility inferior lateral wall; findings consistent w/ large prior infarct and trivial peri-infarct ischemia;  ef 43% w/ inferior hypokinesis and apical akinesis; mild LVE   CORONARY ANGIOPLASTY WITH STENT PLACEMENT   01-04-2006  dr gamble   DES to pSVG to OM1, SVG to OM2 &  IR, and mSVG to RCA dPDA (x2 grafts );  pLAD , pLCFX and  mRCA occluded;  LIMA to LAD mid-distal patent with occlusion beyond LIMA with collateral flow;  ef 30%   CORONARY ARTERY BYPASS GRAFT  09/10/1988   dr tanda   CABG X5 (03/04/2013) LIMA to LAD,SVG to imtermediate OM1 OM II,SVG to PDA   CORONARY BALLOON ANGIOPLASTY N/A 03/05/2023   Procedure: CORONARY BALLOON ANGIOPLASTY;  Surgeon: Dann Candyce RAMAN, MD;  Location: [facility] INVASIVE CV LAB;  Service: Cardiovascular;  Laterality: N/A;   CYSTOSCOPY W/ RETROGRADES Bilateral 04/13/2016   Procedure: CYSTOSCOPY WITH RETROGRADE PYELOGRAM;  Surgeon: Ricardo Likens, MD;  Location: [facility];  Service: Urology;  Laterality: Bilateral;   EP IMPLANTABLE DEVICE N/A 11/29/2015   Procedure: Pacemaker Implant;  Surgeon: Jerel Balding, MD;  Location: [facility] INVASIVE CV LAB;  Service: Cardiovascular;  Laterality: N/A;  new generator and new atrial, vertricular leads;  [employer] Accolade   LEFT HEART CATH AND CORS/GRAFTS ANGIOGRAPHY N/A 03/05/2023   Procedure: LEFT HEART CATH AND CORS/GRAFTS ANGIOGRAPHY;  Surgeon: Dann Candyce RAMAN, MD;  Location: [facility] INVASIVE CV LAB;  Service: Cardiovascular;  Laterality: N/A;   LEFT HEART CATHETERIZATION WITH CORONARY/GRAFT ANGIOGRAM N/A 03/04/2013   Procedure: LEFT HEART CATHETERIZATION WITH EL BILE;  Surgeon: Debby DELENA Sor, MD;  Location: [facility] CATH LAB;  Service: Cardiovascular;  Laterality: N/A;   LOWER EXTREMITY ANGIOGRAM N/A 08/31/2013   Procedure: LOWER EXTREMITY ANGIOGRAM;  Surgeon: Dorn JINNY Lesches, MD;  Location: [facility] CATH LAB;  Service: Cardiovascular;  Laterality: N/A;   PACEMAKER REPLACEMENT  12/31/2000;  08-04-2008   PERCUTANEOUS CORONARY STENT INTERVENTION (PCI-S)  03/04/2013   Procedure: PERCUTANEOUS CORONARY STENT INTERVENTION (PCI-S);  Surgeon: Debby DELENA Sor, MD;  Location: [facility] CATH LAB;  Service: Cardiovascular;;  DES to S-OM  for in-stent restenosis   POPLITEAL ARTERY STENT Right 10-21-2013  dr berry   atherectomy, PTA , and IDEV stent x1   POPLITEAL ARTERY STENT Left 07/ 2015  dr myra at [facility]   angioplasty / stent x2   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   SUPERFICIAL LYMPH NODE BIOPSY / EXCISION Left 2011   cervical lymph node   TRANSURETHRAL RESECTION OF BLADDER TUMOR N/A 04/13/2016   Procedure: TRANSURETHRAL RESECTION OF BLADDER TUMOR (TURBT) with clot evacuation;  Surgeon: Ricardo Likens, MD;  Location: [facility];  Service: Urology;  Laterality: N/A;   VENOGRAM Left 11/29/2015   Procedure: Venogram;  Surgeon: Jerel Balding, MD;  Location: [facility] INVASIVE CV LAB;  Service: Cardiovascular;  Laterality: Left;    Family History  Problem Relation Age of Onset   Diabetes Mother    Stroke Mother    Heart attack Father    Hypertension Sister    Hyperlipidemia Sister    Hypertension Sister    Hyperlipidemia Sister  Social Connections: Not on file     Current Outpatient Medications  Medication Instructions   aspirin  EC 81 mg, Daily   empagliflozin  (JARDIANCE ) 10 mg, Oral, Daily   fluticasone (FLONASE) 50 MCG/ACT nasal spray 2 sprays, Each Nare, Daily   furosemide  (LASIX ) 40 mg, Oral, Every other day   isosorbide  mononitrate (IMDUR ) 90 mg, Oral, 2 times daily   metoprolol  (TOPROL -XL) 200 mg, Oral, Daily, Take with or immediately following a meal.   nitroGLYCERIN  (NITROSTAT ) 0.4 MG SL tablet PLACE 1 TABLET UNDER THE TONGUE EVERY 5 MINUTES AS NEEDED FOR CHEST PAIN   rosuvastatin  (CRESTOR ) 40 mg, Oral, Daily at bedtime   sacubitril -valsartan  (ENTRESTO ) 24-26 MG 1 tablet, Oral, 2 times daily, Start taking 48 hours after stopping Lisinopril .   spironolactone  (ALDACTONE ) 12.5 mg, Oral, Daily   triamcinolone  cream (KENALOG ) 0.1 % 1 Application, As needed   Vascepa  2 g, Oral, 2 times daily     Physical Exam:   BP 104/64 (BP Location: Right Arm, Patient Position:  Sitting)   Pulse 60   Ht 5' 9 (1.753 m)   Wt 185 lb (83.9 kg)   SpO2 93%   BMI 27.32 kg/m   Salient findings:  CN II-XII intact Bilateral EAC clear and TM intact with well pneumatized middle ear spaces Anterior rhinoscopy:  bilateral inferior turbinates with hypertrophy Nasal endoscopy was indicated to better evaluate the nose and paranasal sinuses, given the patient's history and exam findings, and is detailed below. TFL was indicated to better evaluate the proximal airway, given the patient's history and exam findings, and is detailed below. No lesions of oral cavity/oropharynx No respiratory distress or stridor  Seprately Identifiable Procedures:  Prior to initiating any procedures, risks/benefits/alternatives were explained to the patient and verbal consent obtained.   PROCEDURE (05/06/2024): Bilateral Diagnostic Rigid Nasal Endoscopy Pre-procedure diagnosis: Concern for nasal obstruction, chronic sinusitis  Post-procedure diagnosis: same Indication: See pre-procedure diagnosis and physical exam above Complications: None apparent EBL: 0 mL Anesthesia: Lidocaine  4% and topical decongestant was topically sprayed in each nasal cavity  Description of Procedure:  Patient was identified. A rigid 30 degree endoscope was utilized to evaluate the sinonasal cavities, mucosa, sinus ostia and turbinates and septum.  Overall, signs of mucosal inflammation are noted.  Also noted are large inferior turbinates.  No mucopurulence, polyps, or masses noted.   Right Middle meatus: clear  Right SE Recess: clear Left MM: clear Left SE Recess: small amount of drainage posteriorly  Photodocumentation was obtained.  CPT CODE -- 68768 - Mod 25  Procedure Note (05/06/2024) Pre-procedure diagnosis:  Dysphonia  Post-procedure diagnosis: Same Procedure: Transnasal Fiberoptic Laryngoscopy, CPT 31575 - Mod 25 Indication: dysphonia Complications: None apparent EBL: 0 mL  The procedure was undertaken  to further evaluate the patient's complaint of dysphonia, with mirror exam inadequate for appropriate examination due to gag reflex and poor patient tolerance  Procedure:  Patient was identified as correct patient. Verbal consent was obtained. The nose was sprayed with oxymetazoline and 4% lidocaine . The The flexible laryngoscope was passed through the nose to view the nasal cavity, pharynx (oropharynx, hypopharynx) and larynx.  The larynx was examined at rest and during multiple phonatory tasks. Documentation was obtained and reviewed with patient. The scope was removed. The patient tolerated the procedure well.  Findings: The nasal cavity and nasopharynx did not reveal any masses or lesions, mucosa appeared to be without obvious lesions. The tongue base, pharyngeal walls, piriform sinuses, vallecula, epiglottis and postcricoid region are normal in appearance  EXCEPT: slight thinning and bowing of the vocal folds characteristic of age appropriate vocal fold atrophy. The visualized portion of the subglottis and proximal trachea is widely patent. The vocal folds are mobile bilaterally. There are no lesions on the free edge of the vocal folds nor elsewhere in the larynx worrisome for malignancy.       Impression & Plans:  David [name] is a 89 y.o. male with:   Nasal obstruction Bilateral inferior turbinate hypertrophy Chronic sinusitis Nasal congestion and obstruction gradually worsening and not improved by medications. On exam has bilateral IT hypertrophy and small amount of drainage posteriorly on scope exam.  - Discussed trialing flonase once more  - Will obtain sinus CT to evaluate sinuses given his symptoms and exam findings    4. 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 Scope exam revealed age related vocal fold atrophy. We discussed the etiology of his hoarseness and discussed intervention versus voice therapy versus observation and he elected for  observation      See below regarding exact medications prescribed this encounter including dosages and route: Meds ordered this encounter  Medications   fluticasone (FLONASE) 50 MCG/ACT nasal spray    Sig: Place 2 sprays into both nostrils daily.    Dispense:  16 g    Refill:  6    Thank you for allowing me the opportunity to care for your patient. Please do not hesitate to contact me should you have any other questions.  Sincerely, Hadassah Parody, MD Otolaryngologist (ENT), [facility] Phone: [phone] Fax: [phone]

## 2024-05-08 DIAGNOSIS — H524 Presbyopia: Secondary | ICD-10-CM | POA: Diagnosis not present

## 2024-05-08 DIAGNOSIS — H40053 Ocular hypertension, bilateral: Secondary | ICD-10-CM | POA: Diagnosis not present

## 2024-05-11 ENCOUNTER — Telehealth (INDEPENDENT_AMBULATORY_CARE_PROVIDER_SITE_OTHER): Payer: Self-pay

## 2024-05-11 NOTE — Telephone Encounter (Signed)
 Pt called I tried returning the phone call no answer could not leave a voicemail it stated it was unavailable.

## 2024-05-12 ENCOUNTER — Telehealth (INDEPENDENT_AMBULATORY_CARE_PROVIDER_SITE_OTHER): Payer: Self-pay

## 2024-05-12 MED ORDER — FLUTICASONE PROPIONATE 50 MCG/ACT NA SUSP: 2.0000 | Freq: Every day | NASAL | 6 refills | Status: DC

## 2024-05-12 NOTE — Telephone Encounter (Signed)
 Pt called stated the Fluticasone you prescribed him was not sent to the pharmacy ( [facility] [address] [place]). He also was asking about the ct scan you had ordered I gave him the number to call. Pt understood and is going to call to get it scheduled.

## 2024-05-13 DIAGNOSIS — Z23 Encounter for immunization: Secondary | ICD-10-CM | POA: Diagnosis not present

## 2024-05-14 ENCOUNTER — Telehealth (INDEPENDENT_AMBULATORY_CARE_PROVIDER_SITE_OTHER): Payer: Self-pay

## 2024-05-14 ENCOUNTER — Other Ambulatory Visit (INDEPENDENT_AMBULATORY_CARE_PROVIDER_SITE_OTHER): Payer: Self-pay

## 2024-05-14 MED ORDER — FLUTICASONE PROPIONATE 50 MCG/ACT NA SUSP
2.0000 | Freq: Every day | NASAL | 6 refills | Status: AC
Start: 2024-05-14 — End: ?

## 2024-05-14 NOTE — Telephone Encounter (Signed)
 Pt called and LVM that he has not received his medication yet. [facility]  [address]  They told him they have not received it yet.

## 2024-05-14 NOTE — Telephone Encounter (Signed)
 Called Pt and let him know it was being sent over differently apologized for this happening. Pt understood and said thank you so much.

## 2024-05-19 ENCOUNTER — Other Ambulatory Visit: Payer: Self-pay | Admitting: Cardiovascular Disease

## 2024-05-23 ENCOUNTER — Other Ambulatory Visit: Payer: Self-pay

## 2024-05-23 ENCOUNTER — Ambulatory Visit
Admission: EM | Admit: 2024-05-23 | Discharge: 2024-05-23 | Disposition: A | Discharge status: Home / Self Care | Attending: Family Medicine | Admitting: Family Medicine

## 2024-05-23 DIAGNOSIS — H6012 Cellulitis of left external ear: Secondary | ICD-10-CM

## 2024-05-23 MED ORDER — MUPIROCIN 2 % EX OINT: 1.0000 | TOPICAL_OINTMENT | Freq: Three times a day (TID) | CUTANEOUS | 0 refills | Status: AC

## 2024-05-23 MED ORDER — CEPHALEXIN 500 MG PO CAPS: 500.0000 mg | ORAL_CAPSULE | Freq: Four times a day (QID) | ORAL | 0 refills | Status: AC

## 2024-05-23 NOTE — ED Provider Notes (Signed)
 [facility]    CSN: [id number] Arrival date & time: 05/23/24  1532      History   Chief Complaint Chief Complaint  Patient presents with   Otalgia    HPI David [name] is a 89 y.o. male.   The history is provided by the patient.  Otalgia Swelling and soreness to left pinna.  States 2 weeks ago he accidentally hit his ear with a comb causing a wound which bled.  Bleeding has stopped but he has noticed discomfort when he lies on that ear.  Denies fever, chills, sweats.  Denies change in hearing  Past Medical History:  Diagnosis Date   Arthritis    hands (10/19/2013)   Bilateral carotid bruits 04/08/2020   Right greater than left   Bladder tumor    CAD (coronary artery disease)    cardiologist-  dr crouitoru   Carotid artery disease    moderate pRICA 60-79%,  1-39% LICA , >50% RECA last duplex 08-05-2015   Cataract of both eyes    Complete heart block (HCC)    GERD (gastroesophageal reflux disease)    Glaucoma, both eyes    Hematuria    History of MI (myocardial infarction)    1989   Hyperlipidemia    Hypertension    Ischemic cardiomyopathy    ef 40-45%  without clinical heart failure per cardiology note 12-28-2015 (nuclear stress ef 43% from 07-28-2015)   Left ventricular dysfunction    Non Hodgkin's lymphoma (HCC) dx 06/2010---  oncologist-  dr cloretta---  in clinical remission since 2013   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 -- lymphadenopathy to neck, mediastinal , and abdominal--  chemotherapy completed 06/ 2011   NSVT (nonsustained ventricular tachycardia) [facility]) cardiologist-  dr crouitoru   Max rate 200 bpm, 28-beat, nearly 9-second episode recorded by pacemaker on November 15, asymptomatic    Pacemaker pacemaker dependant due to CHB   1st Pacemaker Placement 1998 w/ New generator  12-31-2000 ; 08-04-2008;  11-29-2015   PAD (peripheral artery disease)    followed by dr berry--- last duplex 08-05-2015  patents bilateral  popliteal stents, bilateral SFA 30-49% stenosis   Peripheral vascular disease with claudication    dx 2009 left popliteal occlusion with collaterals; 03- 2015  s/p  stenting right popliteal  by dr berry and left popliteal stenting by dr myra ([facility]) july 2015   S/P CABG x 5    09-10-1988   S/P drug eluting coronary stent placement 01/04/2006   01-04-2006--- DES to SVG to OM1, OM2, and IR, and SVG to PDA(x2 grafts)  and 03-04-2013  DES to SVR to OM   Wears glasses     Patient Active Problem List   Diagnosis Date Noted   Bilateral carotid bruits 04/08/2020   Acute pain of right shoulder 06/11/2018   CHB (complete heart block) (HCC) 11/29/2015   Pacemaker battery depletion 11/28/2015   Pacemaker lead malfunction 11/28/2015   Pacemaker 10/21/2015   NSVT (nonsustained ventricular tachycardia) (HCC) 06/24/2014   Pseudoaneurysm following procedure 10/22/2013   Claudication in peripheral vascular disease 10/19/2013   Hematoma of groin -right sided; w/p PV angio 09/11/2013   Claudication- unsuccessful PTA attempt Lt popliteal 08/31/13 08/31/2013   Asymptomatic stenosis of right carotid artery without infarction 06/14/2013   Angina pectoris 03/05/2013   CAD s/p CABG 1995, s/p stent SVG-OM 2007 and July 2014 03/05/2013   S/P angioplasty with stent SVG-OM 7/30./14 03/05/2013   Complete heart block- [facility]  pacemaker '95 with Gen change '02, '09. New leads 2017. Pacer dependednt 03/05/2013   PVD - Lt popliteal occlusion with collaterals '09; rt popliteal successful diamondback orbital rotational atherectomy PTA and stent using an IDEV stent 10/21/13 03/05/2013   Non Hodgkin's lymphoma- Feb 2011- radiation Rx 03/05/2013   Dyslipidemia 03/05/2013   HTN (hypertension) 03/05/2013   Ischemic cardiomyopathy 03/05/2013   Abnormal nuclear cardiac imaging test- high risk Myoview  02/13/13 03/05/2013    Past Surgical History:  Procedure Laterality Date   ANAL FISSURE REPAIR  12-09-2000 and 1970's    CARDIAC CATHETERIZATION  04/09/2000   patent grafts   CARDIAC PACEMAKER PLACEMENT  11-15-1993   dr lucas   CARDIOVASCULAR STRESS TEST  07-28-2015  dr crouitoru   Intermediate risk nuclear study w/ large, severe, predominantly fixed inferior and apical defect; minimal reversibility inferior lateral wall; findings consistent w/ large prior infarct and trivial peri-infarct ischemia;  ef 43% w/ inferior hypokinesis and apical akinesis; mild LVE   CORONARY ANGIOPLASTY WITH STENT PLACEMENT  01-04-2006  dr gamble   DES to pSVG to OM1, SVG to OM2 &  IR, and mSVG to RCA dPDA (x2 grafts );  pLAD , pLCFX and  mRCA occluded;  LIMA to LAD mid-distal patent with occlusion beyond LIMA with collateral flow;  ef 30%   CORONARY ARTERY BYPASS GRAFT  09/10/1988   dr tanda   CABG X5 (03/04/2013) LIMA to LAD,SVG to imtermediate OM1 OM II,SVG to PDA   CORONARY BALLOON ANGIOPLASTY N/A 03/05/2023   Procedure: CORONARY BALLOON ANGIOPLASTY;  Surgeon: Dann Candyce RAMAN, MD;  Location: [facility] INVASIVE CV LAB;  Service: Cardiovascular;  Laterality: N/A;   CYSTOSCOPY W/ RETROGRADES Bilateral 04/13/2016   Procedure: CYSTOSCOPY WITH RETROGRADE PYELOGRAM;  Surgeon: Ricardo Likens, MD;  Location: [facility];  Service: Urology;  Laterality: Bilateral;   EP IMPLANTABLE DEVICE N/A 11/29/2015   Procedure: Pacemaker Implant;  Surgeon: Jerel Balding, MD;  Location: [facility] INVASIVE CV LAB;  Service: Cardiovascular;  Laterality: N/A;  new generator and new atrial, vertricular leads;  [employer] Accolade   LEFT HEART CATH AND CORS/GRAFTS ANGIOGRAPHY N/A 03/05/2023   Procedure: LEFT HEART CATH AND CORS/GRAFTS ANGIOGRAPHY;  Surgeon: Dann Candyce RAMAN, MD;  Location: [facility] INVASIVE CV LAB;  Service: Cardiovascular;  Laterality: N/A;   LEFT HEART CATHETERIZATION WITH CORONARY/GRAFT ANGIOGRAM N/A 03/04/2013   Procedure: LEFT HEART CATHETERIZATION WITH EL BILE;  Surgeon: Debby DELENA Sor, MD;  Location: [facility] C[facility] LAB;   Service: Cardiovascular;  Laterality: N/A;   LOWER EXTREMITY ANGIOGRAM N/A 08/31/2013   Procedure: LOWER EXTREMITY ANGIOGRAM;  Surgeon: Dorn JINNY Lesches, MD;  Location: [facility] CATH LAB;  Service: Cardiovascular;  Laterality: N/A;   PACEMAKER REPLACEMENT  12/31/2000;  08-04-2008   PERCUTANEOUS CORONARY STENT INTERVENTION (PCI-S)  03/04/2013   Procedure: PERCUTANEOUS CORONARY STENT INTERVENTION (PCI-S);  Surgeon: Debby DELENA Sor, MD;  Location: [facility] CATH LAB;  Service: Cardiovascular;;  DES to S-OM for in-stent restenosis   POPLITEAL ARTERY STENT Right 10-21-2013  dr berry   atherectomy, PTA , and IDEV stent x1   POPLITEAL ARTERY STENT Left 07/ 2015  dr myra at [facility]   angioplasty / stent x2   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   SUPERFICIAL LYMPH NODE BIOPSY / EXCISION Left 2011   cervical lymph node   TRANSURETHRAL RESECTION OF BLADDER TUMOR N/A 04/13/2016   Procedure: TRANSURETHRAL RESECTION OF BLADDER TUMOR (TURBT) with clot evacuation;  Surgeon: Ricardo Likens, MD;  Location: [facility];  Service: Urology;  Laterality: N/A;   VENOGRAM Left 11/29/2015   Procedure: Venogram;  Surgeon: Jerel Balding, MD;  Location: [facility] INVASIVE CV LAB;  Service: Cardiovascular;  Laterality: Left;       Home Medications    Prior to Admission medications   Medication Sig Start Date End Date Taking? Authorizing Provider  aspirin  EC 81 MG tablet Take 81 mg by mouth daily.    [provider]  empagliflozin  (JARDIANCE ) 10 MG TABS tablet Take 1 tablet (10 mg total) by mouth daily. 10/31/23   Croitoru, Mihai, MD  fluticasone (FLONASE) 50 MCG/ACT nasal spray Place 2 sprays into both nostrils daily. 05/14/24   Masciello, Hadassah BROCKS, MD  furosemide  (LASIX ) 40 MG tablet Take 1 tablet (40 mg total) by mouth every other day. 03/26/24   Croitoru, Mihai, MD  icosapent  Ethyl (VASCEPA ) 1 g capsule TAKE TWO CAPSULES BY MOUTH TWICE DAILY 05/22/24   Croitoru, Mihai, MD  isosorbide   mononitrate (IMDUR ) 30 MG 24 hr tablet TAKE 3 TABLETS BY MOUTH TWICE  DAILY 01/30/24   West, Katlyn D, NP  metoprolol  (TOPROL -XL) 200 MG 24 hr tablet Take 1 tablet (200 mg total) by mouth daily. Take with or immediately following a meal. 01/08/24   Croitoru, Mihai, MD  nitroGLYCERIN  (NITROSTAT ) 0.4 MG SL tablet PLACE 1 TABLET UNDER THE TONGUE EVERY 5 MINUTES AS NEEDED FOR CHEST PAIN 02/21/24   Croitoru, Mihai, MD  rosuvastatin  (CRESTOR ) 40 MG tablet TAKE 1 TABLET BY MOUTH AT  BEDTIME 05/06/24   Croitoru, Mihai, MD  sacubitril -valsartan  (ENTRESTO ) 24-26 MG Take 1 tablet by mouth 2 (two) times daily. Start taking 48 hours after stopping Lisinopril . 01/08/24   Croitoru, Mihai, MD  spironolactone  (ALDACTONE ) 25 MG tablet Take 0.5 tablets (12.5 mg total) by mouth daily. 03/26/24   Croitoru, Mihai, MD  triamcinolone  cream (KENALOG ) 0.1 % Apply 1 Application topically as needed. 04/22/23   [provider]    Family History Family History  Problem Relation Age of Onset   Diabetes Mother    Stroke Mother    Heart attack Father    Hypertension Sister    Hyperlipidemia Sister    Hypertension Sister    Hyperlipidemia Sister     Social History Social History   Tobacco Use   Smoking status: Former    Current packs/day: 0.00    Average packs/day: 0.1 packs/day for 1 year (0.1 ttl pk-yrs)    Types: Cigarettes    Start date: 08/07/1955    Quit date: 08/06/1956    Years since quitting: 67.8   Smokeless tobacco: Never  Vaping Use   Vaping status: Never Used  Substance Use Topics   Alcohol  use: No   Drug use: No     Allergies   Azithromycin, Lactose intolerance (gi), Niacin and related, and Sulfa antibiotics   Review of Systems Review of Systems  HENT:  Positive for ear pain.      Physical Exam Triage Vital Signs ED Triage Vitals  Encounter Vitals Group     BP 05/23/24 1553 105/62     Girls Systolic BP Percentile --      Girls Diastolic BP Percentile --      Boys Systolic BP  Percentile --      Boys Diastolic BP Percentile --      Pulse Rate 05/23/24 1553 81     Resp 05/23/24 1553 16     Temp 05/23/24 1553 97.7 F (36.5 C)     Temp Source 05/23/24  1553 Oral     SpO2 05/23/24 1553 94 %     Weight 05/23/24 1555 185 lb (83.9 kg)     Height 05/23/24 1555 5' 9 (1.753 m)     Head Circumference --      Peak Flow --      Pain Score 05/23/24 1555 0     Pain Loc --      Pain Education --      Exclude from Growth Chart --    No data found.  Updated Vital Signs BP 105/62 (BP Location: Right Arm)   Pulse 81   Temp 97.7 F (36.5 C) (Oral)   Resp 16   Ht 5' 9 (1.753 m)   Wt 185 lb (83.9 kg)   SpO2 94%   BMI 27.32 kg/m   Visual Acuity Right Eye Distance:   Left Eye Distance:   Bilateral Distance:    Right Eye Near:   Left Eye Near:    Bilateral Near:     Physical Exam Vitals and nursing note reviewed.  HENT:     Head: Normocephalic.     Ears:     Comments: Left pinna has 2 superficial wounds along the helix, there is local erythema, mild swelling, mild tenderness but no active drainage Eyes:     Conjunctiva/sclera: Conjunctivae normal.  Cardiovascular:     Rate and Rhythm: Normal rate.  Pulmonary:     Effort: Pulmonary effort is normal. No respiratory distress.  Musculoskeletal:     Cervical back: Neck supple.  Lymphadenopathy:     Cervical: No cervical adenopathy.  Neurological:     Mental Status: He is alert.      UC Treatments / Results  Labs (all labs ordered are listed, but only abnormal results are displayed) Labs Reviewed - No data to display  EKG   Radiology No results found.  Procedures Procedures (including critical care time)  Medications Ordered in UC Medications - No data to display  Initial Impression / Assessment and Plan / UC Course  I have reviewed the triage vital signs and the nursing notes.  Pertinent labs & imaging results that were available during my care of the patient were reviewed by me and  considered in my medical decision making (see chart for details).     89 year old male with infected wound left ear mild cellulitis will start on antibiotics.  He was given strict follow-up instructions Final Clinical Impressions(s) / UC Diagnoses   Final diagnoses:  None   Discharge Instructions   None    ED Prescriptions   None    PDMP not reviewed this encounter.   Arrington Yohe, [place] 05/23/24 [phone]

## 2024-05-23 NOTE — Discharge Instructions (Signed)
 Take the antibiotics to completion, wash the ear gently with soap and water daily then apply the antibiotic ointment.  See your doctor in a few days for recheck.  Go to the emergency department for development of fever, significant increase in swelling, redness or pain, concerns

## 2024-05-23 NOTE — ED Triage Notes (Signed)
 Pt presents with a chief complaint of left ear pain x 2-3 weeks. States the ear is feeling very sore and tender. Currently denies pain. Only pain to the touch. Pain makes it difficult to sleep at night. Pt mentions brushing his hair and it caused his ear to bleed. This was about 10 days ago. Made the pain worse/more noticeable.

## 2024-05-29 ENCOUNTER — Ambulatory Visit (HOSPITAL_COMMUNITY)
Admission: RE | Admit: 2024-05-29 | Discharge: 2024-05-29 | Disposition: A | Discharge status: Home / Self Care | Source: Ambulatory Visit

## 2024-05-29 DIAGNOSIS — Z471 Aftercare following joint replacement surgery: Secondary | ICD-10-CM | POA: Diagnosis not present

## 2024-05-29 DIAGNOSIS — J324 Chronic pansinusitis: Secondary | ICD-10-CM | POA: Diagnosis not present

## 2024-06-11 ENCOUNTER — Ambulatory Visit (INDEPENDENT_AMBULATORY_CARE_PROVIDER_SITE_OTHER)

## 2024-06-11 VITALS — BP 101/63 | HR 63

## 2024-06-11 DIAGNOSIS — J343 Hypertrophy of nasal turbinates: Secondary | ICD-10-CM

## 2024-06-11 DIAGNOSIS — R0981 Nasal congestion: Secondary | ICD-10-CM | POA: Diagnosis not present

## 2024-06-11 DIAGNOSIS — J3489 Other specified disorders of nose and nasal sinuses: Secondary | ICD-10-CM

## 2024-06-11 DIAGNOSIS — R490 Dysphonia: Secondary | ICD-10-CM | POA: Diagnosis not present

## 2024-06-11 NOTE — Progress Notes (Signed)
 Dear Dr. Loreli, Here is my assessment for our mutual patient, David [name]. Thank you for allowing me the opportunity to care for your patient. Please do not hesitate to contact me should you have any other questions. Sincerely, Dr. Hadassah Parody  Otolaryngology Clinic Note Referring provider: Dr. Loreli HPI:  Initial HPI (05/06/24) David [name] is a 89 y.o. male kindly referred by Dr. Loreli for evaluation of nasal congestion and dysphonia.   Hoarseness has been getting worse over the years. Used to sing in the choir.   A couple of months ago the ears and the sinuses felt plugged up.  Currently feels like he can breathe through nose but feels congested in his head. Feels pressure in sinuses and feels like sinuses are blocked. No prior sinus surgery. He has tried antihistamine, nasal saline and atrovent without improvement. The nasal congestion and pressure is bothersome. Flonase has been used in the past but not daily or for extended period. No recent sinus infections.   He does have some dyspnea on exertion (hx of CHF and working with cardiology).   H&N Surgery: left parotidectomy 20 years; benign etiology per patient.   PMH:  - CHF - Non-hodgkin lymphoma - HTN  - GERD  --------------------------------------------------------- 06/11/2024  Discussed the use of AI scribe software for clinical note transcription with the patient, who gave verbal consent to proceed.  History of Present Illness  No big changes since he was last seen. Nasal obstruction improved with flonase.      Independent Review of Additional Tests or Records:  Referral note 04/22/24 from Suzen Loreli, MD reviewed: pt with ongoing sinus congestion despite medical management  CT sinus 06/03/24 independently reviewed and shows clear sinuses, rightward nasal septal deviation, bilateral inferior turbinate hypertrophy, concha bullosa     PMH/Meds/All/SocHx/FamHx/ROS:   Past Medical History:  Diagnosis Date    Arthritis    hands (10/19/2013)   Bilateral carotid bruits 04/08/2020   Right greater than left   Bladder tumor    CAD (coronary artery disease)    cardiologist-  dr crouitoru   Carotid artery disease    moderate pRICA 60-79%,  1-39% LICA , >50% RECA last duplex 08-05-2015   Cataract of both eyes    Complete heart block (HCC)    GERD (gastroesophageal reflux disease)    Glaucoma, both eyes    Hematuria    History of MI (myocardial infarction)    1989   Hyperlipidemia    Hypertension    Ischemic cardiomyopathy    ef 40-45%  without clinical heart failure per cardiology note 12-28-2015 (nuclear stress ef 43% from 07-28-2015)   Left ventricular dysfunction    Non Hodgkin's lymphoma (HCC) dx 06/2010---  oncologist-  dr cloretta---  in clinical remission since 2013   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 -- lymphadenopathy to neck, mediastinal , and abdominal--  chemotherapy completed 06/ 2011   NSVT (nonsustained ventricular tachycardia) [facility]) cardiologist-  dr crouitoru   Max rate 200 bpm, 28-beat, nearly 9-second episode recorded by pacemaker on November 15, asymptomatic    Pacemaker pacemaker dependant due to CHB   1st Pacemaker Placement 1998 w/ New generator  12-31-2000 ; 08-04-2008;  11-29-2015   PAD (peripheral artery disease)    followed by dr berry--- last duplex 08-05-2015  patents bilateral popliteal stents, bilateral SFA 30-49% stenosis   Peripheral vascular disease with claudication    dx 2009 left popliteal occlusion with collaterals; 03- 2015  s/p  stenting right  popliteal  by dr berry and left popliteal stenting by dr myra ([facility]) july 2015   S/P CABG x 5    09-10-1988   S/P drug eluting coronary stent placement 01/04/2006   01-04-2006--- DES to SVG to OM1, OM2, and IR, and SVG to PDA(x2 grafts)  and 03-04-2013  DES to SVR to OM   Wears glasses      Past Surgical History:  Procedure Laterality Date   ANAL FISSURE REPAIR  12-09-2000  and 1970's   CARDIAC CATHETERIZATION  04/09/2000   patent grafts   CARDIAC PACEMAKER PLACEMENT  11-15-1993   dr lucas   CARDIOVASCULAR STRESS TEST  07-28-2015  dr crouitoru   Intermediate risk nuclear study w/ large, severe, predominantly fixed inferior and apical defect; minimal reversibility inferior lateral wall; findings consistent w/ large prior infarct and trivial peri-infarct ischemia;  ef 43% w/ inferior hypokinesis and apical akinesis; mild LVE   CORONARY ANGIOPLASTY WITH STENT PLACEMENT  01-04-2006  dr gamble   DES to pSVG to OM1, SVG to OM2 &  IR, and mSVG to RCA dPDA (x2 grafts );  pLAD , pLCFX and  mRCA occluded;  LIMA to LAD mid-distal patent with occlusion beyond LIMA with collateral flow;  ef 30%   CORONARY ARTERY BYPASS GRAFT  09/10/1988   dr tanda   CABG X5 (03/04/2013) LIMA to LAD,SVG to imtermediate OM1 OM II,SVG to PDA   CORONARY BALLOON ANGIOPLASTY N/A 03/05/2023   Procedure: CORONARY BALLOON ANGIOPLASTY;  Surgeon: Dann Candyce RAMAN, MD;  Location: [facility] INVASIVE CV LAB;  Service: Cardiovascular;  Laterality: N/A;   CYSTOSCOPY W/ RETROGRADES Bilateral 04/13/2016   Procedure: CYSTOSCOPY WITH RETROGRADE PYELOGRAM;  Surgeon: Ricardo Likens, MD;  Location: [facility];  Service: Urology;  Laterality: Bilateral;   EP IMPLANTABLE DEVICE N/A 11/29/2015   Procedure: Pacemaker Implant;  Surgeon: Jerel Balding, MD;  Location: [facility] INVASIVE CV LAB;  Service: Cardiovascular;  Laterality: N/A;  new generator and new atrial, vertricular leads;  [employer] Accolade   LEFT HEART CATH AND CORS/GRAFTS ANGIOGRAPHY N/A 03/05/2023   Procedure: LEFT HEART CATH AND CORS/GRAFTS ANGIOGRAPHY;  Surgeon: Dann Candyce RAMAN, MD;  Location: [facility] INVASIVE CV LAB;  Service: Cardiovascular;  Laterality: N/A;   LEFT HEART CATHETERIZATION WITH CORONARY/GRAFT ANGIOGRAM N/A 03/04/2013   Procedure: LEFT HEART CATHETERIZATION WITH EL BILE;  Surgeon: Debby DELENA Sor, MD;  Location:  [facility] CATH LAB;  Service: Cardiovascular;  Laterality: N/A;   LOWER EXTREMITY ANGIOGRAM N/A 08/31/2013   Procedure: LOWER EXTREMITY ANGIOGRAM;  Surgeon: Dorn JINNY Lesches, MD;  Location: [facility] CATH LAB;  Service: Cardiovascular;  Laterality: N/A;   PACEMAKER REPLACEMENT  12/31/2000;  08-04-2008   PERCUTANEOUS CORONARY STENT INTERVENTION (PCI-S)  03/04/2013   Procedure: PERCUTANEOUS CORONARY STENT INTERVENTION (PCI-S);  Surgeon: Debby DELENA Sor, MD;  Location: [facility] CATH LAB;  Service: Cardiovascular;;  DES to S-OM for in-stent restenosis   POPLITEAL ARTERY STENT Right 10-21-2013  dr berry   atherectomy, PTA , and IDEV stent x1   POPLITEAL ARTERY STENT Left 07/ 2015  dr [facility]ra at [facility]   angioplasty / stent x2   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   SUPERFICIAL LYMPH NODE BIOPSY / EXCISION Left 2011   cervical lymph node   TRANSURETHRAL RESECTION OF BLADDER TUMOR N/A 04/13/2016   Procedure: TRANSURETHRAL RESECTION OF BLADDER TUMOR (TURBT) with clot evacuation;  Surgeon: Ricardo Likens, MD;  Location: [facility];  Service: Urology;  Laterality: N/A;  VENOGRAM Left 11/29/2015   Procedure: Venogram;  Surgeon: Jerel Balding, MD;  Location: [facility] INVASIVE CV LAB;  Service: Cardiovascular;  Laterality: Left;    Family History  Problem Relation Age of Onset   Diabetes Mother    Stroke Mother    Heart attack Father    Hypertension Sister    Hyperlipidemia Sister    Hypertension Sister    Hyperlipidemia Sister      Social Connections: Not on file     Current Outpatient Medications  Medication Instructions   aspirin  EC 81 mg, Daily   empagliflozin  (JARDIANCE ) 10 mg, Oral, Daily   fluticasone (FLONASE) 50 MCG/ACT nasal spray 2 sprays, Each Nare, Daily   furosemide  (LASIX ) 40 mg, Oral, Every other day   isosorbide  mononitrate (IMDUR ) 90 mg, Oral, 2 times daily   metoprolol  (TOPROL -XL) 200 mg, Oral, Daily, Take with or immediately following a meal.    nitroGLYCERIN  (NITROSTAT ) 0.4 MG SL tablet PLACE 1 TABLET UNDER THE TONGUE EVERY 5 MINUTES AS NEEDED FOR CHEST PAIN   rosuvastatin  (CRESTOR ) 40 mg, Oral, Daily at bedtime   sacubitril -valsartan  (ENTRESTO ) 24-26 MG 1 tablet, Oral, 2 times daily, Start taking 48 hours after stopping Lisinopril .   spironolactone  (ALDACTONE ) 12.5 mg, Oral, Daily   triamcinolone  cream (KENALOG ) 0.1 % 1 Application, As needed   Vascepa  2 g, Oral, 2 times daily     Physical Exam:   BP 101/63 (BP Location: Left Arm, Patient Position: Sitting)   Pulse 63   SpO2 95%   Salient findings:  CN II-XII intact Bilateral EAC clear and TM intact with well pneumatized middle ear spaces Anterior rhinoscopy:  bilateral inferior turbinates with hypertrophy No lesions of oral cavity/oropharynx No respiratory distress or stridor  Seprately Identifiable Procedures:  Prior to initiating any procedures, risks/benefits/alternatives were explained to the patient and verbal consent obtained.   PROCEDURE (05/06/24): Bilateral Diagnostic Rigid Nasal Endoscopy Pre-procedure diagnosis: Concern for nasal obstruction, chronic sinusitis  Post-procedure diagnosis: same Indication: See pre-procedure diagnosis and physical exam above Complications: None apparent EBL: 0 mL Anesthesia: Lidocaine  4% and topical decongestant was topically sprayed in each nasal cavity  Description of Procedure:  Patient was identified. A rigid 30 degree endoscope was utilized to evaluate the sinonasal cavities, mucosa, sinus ostia and turbinates and septum.  Overall, signs of mucosal inflammation are noted.  Also noted are large inferior turbinates.  No mucopurulence, polyps, or masses noted.   Right Middle meatus: clear  Right SE Recess: clear Left MM: clear Left SE Recess: small amount of drainage posteriorly  Photodocumentation was obtained.  CPT CODE -- 68768 - Mod 25  Procedure Note (05/06/24) Pre-procedure diagnosis:  Dysphonia  Post-procedure  diagnosis: Same Procedure: Transnasal Fiberoptic Laryngoscopy, CPT 31575 - Mod 25 Indication: dysphonia Complications: None apparent EBL: 0 mL  The procedure was undertaken to further evaluate the patient's complaint of dysphonia, with mirror exam inadequate for appropriate examination due to gag reflex and poor patient tolerance  Procedure:  Patient was identified as correct patient. Verbal consent was obtained. The nose was sprayed with oxymetazoline and 4% lidocaine . The The flexible laryngoscope was passed through the nose to view the nasal cavity, pharynx (oropharynx, hypopharynx) and larynx.  The larynx was examined at rest and during multiple phonatory tasks. Documentation was obtained and reviewed with patient. The scope was removed. The patient tolerated the procedure well.  Findings: The nasal cavity and nasopharynx did not reveal any masses or lesions, mucosa appeared to be without obvious lesions. The tongue base,  pharyngeal walls, piriform sinuses, vallecula, epiglottis and postcricoid region are normal in appearance EXCEPT: slight thinning and bowing of the vocal folds characteristic of age appropriate vocal fold atrophy. The visualized portion of the subglottis and proximal trachea is widely patent. The vocal folds are mobile bilaterally. There are no lesions on the free edge of the vocal folds nor elsewhere in the larynx worrisome for malignancy.       Impression & Plans:  David [name] is a 89 y.o. male with:    Assessment and Plan Assessment & Plan Nasal congestion Bilateral inferior turbinate hypertrophy Bilateral concha bullosa  Intermittent congestion likely due to turbinate swelling. No sinus disease on CT. Symptoms improve with Flonase. - Continue Flonase nasal spray, increase to twice daily if needed.  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 Scope exam revealed age related vocal fold atrophy. We discussed the etiology of his  hoarseness and discussed intervention versus voice therapy versus observation and he elected for observation    See below regarding exact medications prescribed this encounter including dosages and route: No orders of the defined types were placed in this encounter.   Thank you for allowing me the opportunity to care for your patient. Please do not hesitate to contact me should you have any other questions.  Sincerely, Hadassah Parody, MD Otolaryngologist (ENT), [facility] Phone: [phone] Fax: [phone]

## 2024-06-17 ENCOUNTER — Encounter: Payer: Self-pay | Admitting: Cardiovascular Disease

## 2024-06-26 ENCOUNTER — Ambulatory Visit: Attending: Cardiovascular Disease | Admitting: Cardiovascular Disease

## 2024-06-26 ENCOUNTER — Encounter: Payer: Self-pay | Admitting: Cardiovascular Disease

## 2024-06-26 VITALS — BP 98/58 | HR 60 | Resp 16 | Ht 69.0 in | Wt 189.0 lb

## 2024-06-26 DIAGNOSIS — Z950 Presence of cardiac pacemaker: Secondary | ICD-10-CM | POA: Insufficient documentation

## 2024-06-26 DIAGNOSIS — I25718 Atherosclerosis of autologous vein coronary artery bypass graft(s) with other forms of angina pectoris: Secondary | ICD-10-CM | POA: Insufficient documentation

## 2024-06-26 DIAGNOSIS — I739 Peripheral vascular disease, unspecified: Secondary | ICD-10-CM | POA: Diagnosis not present

## 2024-06-26 DIAGNOSIS — I6521 Occlusion and stenosis of right carotid artery: Secondary | ICD-10-CM | POA: Diagnosis not present

## 2024-06-26 DIAGNOSIS — E785 Hyperlipidemia, unspecified: Secondary | ICD-10-CM | POA: Insufficient documentation

## 2024-06-26 DIAGNOSIS — I5042 Chronic combined systolic (congestive) and diastolic (congestive) heart failure: Secondary | ICD-10-CM | POA: Insufficient documentation

## 2024-06-26 DIAGNOSIS — Z8572 Personal history of non-Hodgkin lymphomas: Secondary | ICD-10-CM | POA: Diagnosis not present

## 2024-06-26 DIAGNOSIS — I10 Essential (primary) hypertension: Secondary | ICD-10-CM | POA: Insufficient documentation

## 2024-06-26 DIAGNOSIS — I442 Atrioventricular block, complete: Secondary | ICD-10-CM | POA: Diagnosis not present

## 2024-06-26 NOTE — Progress Notes (Unsigned)
 Patient ID: David [name], male   DOB: Aug 30, 1932, 89 y.o.   MRN: [id number]      Cardiology Office Note    Date:  06/27/2024   ID:  David [name], DOB Mar 07, 1933, MRN [id number]  PCP:  David Kins, MD  Cardiologist:  David Lesches, MD (PAD);  David Balding, MD   Chief Complaint  Patient presents with   Chronic combined systolic and diastolic heart failure (HCC)   Follow-up    3 month     History of Present Illness:  David [name] is a 89 y.o. male David [name] is a 89 y.o. male with complete heart block, coronary artery disease, PAD of the lower extremities (has occluded popliteal stents in the left lower extremity), symptomatic PVCs (and one episode of lengthy nonsustained VT detected by his pacemaker), CHF with mildly reduced LVEF.  He continues to have CCS functional class II-III exertional angina.  This bothers him the most in the morning when he gets stressed and seems to get better later in the day.  He takes his first dose of isosorbide  mononitrate after he eats breakfast.  He also had exertional dyspnea that seems to parallel the angina pectoris.  He has not had orthopnea or PND and denies lower extremity edema.  He is on a very high dose of beta-blocker and isosorbide  and has been intolerant of ranolazine  due to severe constipation (we have tried twice.  His blood pressure (typically in the 90s/high 50s) limits additional antianginal medications.  In addition to beta-blockers he is also on spironolactone  and a very low-dose and Jardiance  and takes furosemide  every other day.  He is on high-dose rosuvastatin  and takes daily aspirin .  Metabolic control is adequate with an LDL cholesterol of 57, HDL 36, triglycerides 169.  He does not have diabetes mellitus.  His most recent creatinine is 1.31.  Cardiac catheterization on 03/05/2023 showed occlusion of the native LAD, the major OM branch and the native right coronary artery.  There was a patent LIMA to the distal LAD artery  and the saphenous vein graft to the RCA was occluded.  There was also 90% in-stent stenosis in the saphenous vein graft to the oblique marginal artery.  Angioplasty was performed with a lot of difficulty, despite the use of scoring and cutting balloons and a large diameter noncompliant balloon; there was still a moderate residual stenosis and a new stent could not be placed.  His echocardiogram in January 2025 showed findings consistent with elevated mean left atrial pressure (pseudonormal mitral inflow, E/e'> 20).  It also showed LVEF 45-50% with apical akinesis, but without major valvular problems.  Heart failure medication doses have been limited by low blood pressure, but he seems to be tolerating Entresto .  After the most recent catheterization he had a protracted period of severe itching without obvious rashes, that has finally resolved with antihistamines and a topical cream.  Note that he had taken clopidogrel  before and has had numerous contrast based studies without pruritus like this.  Pacemaker interrogation office today shows normal function estimated joint longevity of 3.5 years and virtually 100% AV sequential pacing.  There have been no episodes of atrial fibrillation or ventricular tachycardia since his last download.  Lead parameters are normal.  His heart rate histogram is relatively blunted, but he is also fairly sedentary due to his problems with angina.  He is pacemaker dependent due to complete AV block and presenting rhythm is AV sequential pacing. He has a Environmental [employer]  Accolade dual-chamber pacemaker that was implanted as new system with a new generator and leads in 2017, when his old device with unipolar leads reached end of service.  The old leads were left in place so even though his system is MRI compatible, he should not have an MRI due to abandoned leads.    [employer] Accolade EL DR model number [id number], serial number [id number] Right atrial lead (new) [employer]  Ingevity MRI, model number 7741, serial number [id number] Right ventricular lead (new)  [employer] MRI, model number [id number], serial number [id number]. Old unipolar leads are still in place so that he is not MRI conditional despite the fact that the individual components of his new system are MRI conditional.  David [name] has had numerous coronary revascularization procedures, most recently angioplasty for previously stented saphenous vein graft to the oblique marginal artery in July 2024. He is now almost 20 years status post coronary bypass surgery. The same bypass graft had received a stent in 2007. He has mild ischemic cardiomyopathy with an ejection fraction of 40-45%, without clinical  heart failure. The nuclear stress test on July 28, 2015 shows old scar in the apex and inferior wall. LVEF is calculated at 43% (identical to 2014 and 2015).   He had occlusion of the left popliteal artery with distal reconstitution via collaterals. He had successful recanalization with placement of 2 stents by Dr. Myra in 2015 . This led to marked improvement in his claudication, but the stents subsequently occluded.  Claudication improved with cilostazol . He is pacemaker dependent secondary to complete heart block. He had a generator changeout in 2017. Interrogation of his device today again shows some issues with noise on the atrial channel but none on the ventricular channel.  Moderate carotid artery disease (stable 60-79% proximal internal carotid artery stenosis on the right, mild plaque on the left), last duplex US  was February 11, 2015.   He has a history of non-Hodgkin's lymphoma with a large neck mass but also mediastinal and abdominal lymphadenopathy treated with radiotherapy to the cervical area in 2011 followed by chemotherapy with Rituxan and fludarabine. He is considered to be in remission.   He has undergone cystoscopic resection of a bladder tumor and bilateral cataract surgery.    Past Medical  History:  Diagnosis Date   Arthritis    hands (10/19/2013)   Bilateral carotid bruits 04/08/2020   Right greater than left   Bladder tumor    CAD (coronary artery disease)    cardiologist-  dr crouitoru   Carotid artery disease    moderate pRICA 60-79%,  1-39% LICA , >50% RECA last duplex 08-05-2015   Cataract of both eyes    Complete heart block (HCC)    GERD (gastroesophageal reflux disease)    Glaucoma, both eyes    Hematuria    History of MI (myocardial infarction)    1989   Hyperlipidemia    Hypertension    Ischemic cardiomyopathy    ef 40-45%  without clinical heart failure per cardiology note 12-28-2015 (nuclear stress ef 43% from 07-28-2015)   Left ventricular dysfunction    Non Hodgkin's lymphoma (HCC) dx 06/2010---  oncologist-  dr cloretta---  in clinical remission since 2013   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 -- lymphadenopathy to neck, mediastinal , and abdominal--  chemotherapy completed 06/ 2011   NSVT (nonsustained ventricular tachycardia) [facility]) cardiologist-  dr crouitoru   Max rate 200 bpm, 28-beat, nearly 9-second episode recorded  by pacemaker on November 15, asymptomatic    Pacemaker pacemaker dependant due to CHB   1st Pacemaker Placement 1998 w/ New generator  12-31-2000 ; 08-04-2008;  11-29-2015   PAD (peripheral artery disease)    followed by dr berry--- last duplex 08-05-2015  patents bilateral popliteal stents, bilateral SFA 30-49% stenosis   Peripheral vascular disease with claudication    dx 2009 left popliteal occlusion with collaterals; 03- 2015  s/p  stenting right popliteal  by dr berry and left popliteal stenting by dr David [name] ([facility]) july 2015   S/P CABG x 5    09-10-1988   S/P drug eluting coronary stent placement 01/04/2006   01-04-2006--- DES to SVG to OM1, OM2, and IR, and SVG to PDA(x2 grafts)  and 03-04-2013  DES to SVR to OM   Wears glasses     Past Surgical History:  Procedure Laterality Date   ANAL  FISSURE REPAIR  12-09-2000 and 1970's   CARDIAC CATHETERIZATION  04/09/2000   patent grafts   CARDIAC PACEMAKER PLACEMENT  11-15-1993   dr lucas   CARDIOVASCULAR STRESS TEST  07-28-2015  dr crouitoru   Intermediate risk nuclear study w/ large, severe, predominantly fixed inferior and apical defect; minimal reversibility inferior lateral wall; findings consistent w/ large prior infarct and trivial peri-infarct ischemia;  ef 43% w/ inferior hypokinesis and apical akinesis; mild LVE   CORONARY ANGIOPLASTY WITH STENT PLACEMENT  01-04-2006  dr gamble   DES to pSVG to OM1, SVG to OM2 &  IR, and mSVG to RCA dPDA (x2 grafts );  pLAD , pLCFX and  mRCA occluded;  LIMA to LAD mid-distal patent with occlusion beyond LIMA with collateral flow;  ef 30%   CORONARY ARTERY BYPASS GRAFT  09/10/1988   dr tanda   CABG X5 (03/04/2013) LIMA to LAD,SVG to imtermediate OM1 OM II,SVG to PDA   CORONARY BALLOON ANGIOPLASTY N/A 03/05/2023   Procedure: CORONARY BALLOON ANGIOPLASTY;  Surgeon: Dann Candyce RAMAN, MD;  Location: [facility] INVASIVE CV LAB;  Service: Cardiovascular;  Laterality: N/A;   CYSTOSCOPY W/ RETROGRADES Bilateral 04/13/2016   Procedure: CYSTOSCOPY WITH RETROGRADE PYELOGRAM;  Surgeon: Ricardo Likens, MD;  Location: [facility];  Service: Urology;  Laterality: Bilateral;   EP IMPLANTABLE DEVICE N/A 11/29/2015   Procedure: Pacemaker Implant;  Surgeon: David Balding, MD;  Location: [facility] INVASIVE CV LAB;  Service: Cardiovascular;  Laterality: N/A;  new generator and new atrial, vertricular leads;  [employer] Accolade   LEFT HEART CATH AND CORS/GRAFTS ANGIOGRAPHY N/A 03/05/2023   Procedure: LEFT HEART CATH AND CORS/GRAFTS ANGIOGRAPHY;  Surgeon: Dann Candyce RAMAN, MD;  Location: [facility] INVASIVE CV LAB;  Service: Cardiovascular;  Laterality: N/A;   LEFT HEART CATHETERIZATION WITH CORONARY/GRAFT ANGIOGRAM N/A 03/04/2013   Procedure: LEFT HEART CATHETERIZATION WITH EL BILE;  Surgeon:  Debby DELENA Sor, MD;  Location: [facility] CATH LAB;  Service: Cardiovascular;  Laterality: N/A;   LOWER EXTREMITY ANGIOGRAM N/A 08/31/2013   Procedure: LOWER EXTREMITY ANGIOGRAM;  Surgeon: David JINNY Lesches, MD;  Location: [facility] CATH LAB;  Service: Cardiovascular;  Laterality: N/A;   PACEMAKER REPLACEMENT  12/31/2000;  08-04-2008   PERCUTANEOUS CORONARY STENT INTERVENTION (PCI-S)  03/04/2013   Procedure: PERCUTANEOUS CORONARY STENT INTERVENTION (PCI-S);  Surgeon: Debby DELENA Sor, MD;  Location: [facility] CATH LAB;  Service: Cardiovascular;;  DES to S-OM for in-stent restenosis   POPLITEAL ARTERY STENT Right 10-21-2013  dr berry   atherectomy, PTA , and IDEV stent x1   POPLITEAL ARTERY STENT Left 07/ 2015  dr David [name] at [facility]   angioplasty / stent x2   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   SUPERFICIAL LYMPH NODE BIOPSY / EXCISION Left 2011   cervical lymph node   TRANSURETHRAL RESECTION OF BLADDER TUMOR N/A 04/13/2016   Procedure: TRANSURETHRAL RESECTION OF BLADDER TUMOR (TURBT) with clot evacuation;  Surgeon: Ricardo Likens, MD;  Location: [facility];  Service: Urology;  Laterality: N/A;   VENOGRAM Left 11/29/2015   Procedure: Venogram;  Surgeon: David Balding, MD;  Location: [facility] INVASIVE CV LAB;  Service: Cardiovascular;  Laterality: Left;    Outpatient Medications Prior to Visit  Medication Sig Dispense Refill   aspirin  EC 81 MG tablet Take 81 mg by mouth daily.     empagliflozin  (JARDIANCE ) 10 MG TABS tablet Take 1 tablet (10 mg total) by mouth daily. 90 tablet 3   fluticasone  (FLONASE ) 50 MCG/ACT nasal spray Place 2 sprays into both nostrils daily. 16 g 6   furosemide  (LASIX ) 40 MG tablet Take 1 tablet (40 mg total) by mouth every other day. 75 tablet 3   icosapent  Ethyl (VASCEPA ) 1 g capsule TAKE TWO CAPSULES BY MOUTH TWICE DAILY 360 capsule 3   isosorbide  mononitrate (IMDUR ) 30 MG 24 hr tablet TAKE 3 TABLETS BY MOUTH TWICE  DAILY 180 tablet 11   metoprolol   (TOPROL -XL) 200 MG 24 hr tablet Take 1 tablet (200 mg total) by mouth daily. Take with or immediately following a meal. 10 tablet 0   nitroGLYCERIN  (NITROSTAT ) 0.4 MG SL tablet PLACE 1 TABLET UNDER THE TONGUE EVERY 5 MINUTES AS NEEDED FOR CHEST PAIN 25 tablet 10   rosuvastatin  (CRESTOR ) 40 MG tablet TAKE 1 TABLET BY MOUTH AT  BEDTIME 90 tablet 3   sacubitril -valsartan  (ENTRESTO ) 24-26 MG Take 1 tablet by mouth 2 (two) times daily. Start taking 48 hours after stopping Lisinopril . 20 tablet 0   spironolactone  (ALDACTONE ) 25 MG tablet Take 0.5 tablets (12.5 mg total) by mouth daily. 45 tablet 3   triamcinolone  cream (KENALOG ) 0.1 % Apply 1 Application topically as needed.     No facility-administered medications prior to visit.     Allergies:   Azithromycin, Lactose intolerance (gi), Niacin and related, and Sulfa antibiotics     Family History:  The patient's family history includes Diabetes in his mother; Heart attack in his father; Hyperlipidemia in his sister and sister; Hypertension in his sister and sister; Stroke in his mother.     PHYSICAL EXAM:   VS:  BP (!) 98/58 (BP Location: Left Arm, Patient Position: Sitting, Cuff Size: Normal)   Pulse 60   Resp 16   Ht 5' 9 (1.753 m)   Wt 189 lb (85.7 kg)   SpO2 96%   BMI 27.91 kg/m      General: Alert, oriented x3, no distress, healthy pacemaker site Head: no evidence of trauma, PERRL, EOMI, no exophtalmos or lid lag, no myxedema, no xanthelasma; normal ears, nose and oropharynx Neck: normal jugular venous pulsations and no hepatojugular reflux; brisk carotid pulses without delay and no carotid bruits Chest: clear to auscultation, no signs of consolidation by percussion or palpation, normal fremitus, symmetrical and full respiratory excursions Cardiovascular: normal position and quality of the apical impulse, regular rhythm, normal first and second heart sounds, no murmurs, rubs or gallops Abdomen: no tenderness or distention, no masses  by palpation, no abnormal pulsatility or arterial bruits, normal bowel sounds, no hepatosplenomegaly Extremities: no clubbing, cyanosis or edema; 2+ radial, ulnar and brachial  pulses bilaterally; 2+ right femoral, posterior tibial and dorsalis pedis pulses; 2+ left femoral, posterior tibial and dorsalis pedis pulses; no subclavian or femoral bruits Neurological: grossly nonfocal Psych: Normal mood and affect      Wt Readings from Last 3 Encounters:  06/26/24 189 lb (85.7 kg)  05/23/24 185 lb (83.9 kg)  05/06/24 185 lb (83.9 kg)    Studies/Labs Reviewed:  Pharmacological nuclear stress test 01/18/2021 The left ventricular ejection fraction is moderately decreased (30-44%). Nuclear stress EF: 36%. There was no ST segment deviation noted during stress. Defect 1: There is a medium defect of severe severity present in the basal inferior, mid inferior, apical inferior, apical lateral and apex location. Findings consistent with prior myocardial infarction. This is an intermediate risk study due to reduced systolic function. There is no ischemia.   EKG: Personally reviewed the most recent tracing from 08/31/2023 which shows AV sequential pacing.  Intracardiac electrogram today also shows AV sequential pacing.   EKG Interpretation Date/Time:    Ventricular Rate:    PR Interval:    QRS Duration:    QT Interval:    QTC Calculation:   R Axis:      Text Interpretation:          Recent Labs: 04/30/2024: BNP 259.9; BUN 26; Creatinine, Ser 1.31; Potassium 5.1; Sodium 140   Lipid Panel  07/15/2020 Cholesterol 132, HDL 41, LDL 67, triglycerides 136, creatinine 1.38, potassium 4.5, normal liver function tests  08/09/2022 Cholesterol 142, HDL 46, LDL 66, triglycerides 178 Hemoglobin 14.1, creatinine 1.19, potassium 4.3, ALT 30, platelets 197 K  08/15/2023 Cholesterol 122, HDL 36, LDL 57, triglycerides 169 Hemoglobin 13.2, ALT 22      Component Value Date/Time   CHOL 119  06/24/2014 0855   TRIG 185 (H) 06/24/2014 0855   HDL 35 (L) 06/24/2014 0855   CHOLHDL 3.4 06/24/2014 0855   VLDL 37 06/24/2014 0855   LDLCALC 47 06/24/2014 0855   ASSESSMENT:    1. Chronic combined systolic and diastolic heart failure (HCC)   2. Coronary artery disease of autologous vein bypass graft with stable angina pectoris   3. CHB (complete heart block) (HCC)   4. Pacemaker   5. PAD (peripheral artery disease)   6. Essential hypertension   7. Asymptomatic stenosis of right carotid artery without infarction   8. Dyslipidemia (high LDL; low HDL)   9. History of lymphoma             PLAN:  In order of problems listed above:  CHF: No longer appears to have dyspnea at rest.  Moderately decreased LVEF.  No clinical signs of hypervolemia.  NYHA functional class II-III continue Entresto , spironolactone , furosemide , Jardiance , metoprolol  succinate.  Decreased EF is partly due to RV pacing induced dyssynchrony, but does not appear to be low enough to justify consideration of CRT.  Stable electrolytes and renal parameters. CAD (CABG 1995, PCI SVG-OM 2007 and 2014): It is very likely that he once again has restenosis in the SVG-OM were only partial improvement with angioplasty was possible at his last procedure in July 2024, despite the use of large diameter Cutting Balloon and noncompliant balloons at high pressure.  The interventional cardiologist at that time suggested possible use of a shockwave device to allow better dilation of the vessel, but it is unclear to me whether the lesion is within the stent or not.  Showed the images to a couple of my interventional partners and they agree that the intervention could be attempted  again, with the use of intravascular ultrasound.   Also it is possible that he may still have some ischemic myocardium in the distribution of the right coronary artery, although perfusion studies have suggested that this is mostly scar.  No option for percutaneous  revascularization of the inferior wall since there is total occlusion of the native RCA and the SVG to the RCA.  The inferior wall does receive some collateral flow from the LIMA-LAD.  Will continue with medical management for now.  Will have him take his first dose of isosorbide  mononitrate first thing in the morning, before he even gets out of bed, to see if this improves his symptoms of worst exertional angina in the mornings. CHB: He is pacemaker dependent.  There is no escape rhythm. Pacemaker: Device function is normal.  Performing remote downloads every 3 months.  Despite the aggressive minute ventilation sensor settings his heart rate histogram remains quite blunted.  He has abandoned unipolar leads so he cannot have an MRI even though his current system is MRI conditional. PAD: He does not currently have problems with intermittent claudication.  PVCs/NSVT: None seen recently.  In the past used to have relatively frequent episodes of brief nonsustained VT. HTN: Blood pressure borderline low.  He has not had dizziness or syncope. Asymptomatic stenosis of the right carotid artery: Carotid duplex ultrasound performed 04/07/2024 shows stable 60-79% stenosis in the right internal carotid artery as well as greater than 50% stenosis in the common carotid artery on that side.  He is seeing Dr. Magda. HLP: LDL within target range.  Chronically low HDL despite the fact that he is very lean.  No change recommended to his medications Lymphoma in remission  Medication Adjustments/Labs and Tests Ordered: Current medicines are reviewed at length with the patient today.  Concerns regarding medicines are outlined above.  Medication changes, Labs and Tests ordered today are listed in the Patient Instructions below. Patient Instructions  Medication Instructions:  Please take your Isosorbide  Mononitrate at 7 am (before you get out of bed in the morning) and second dose at 5 pm.  *If you need a refill on your  cardiac medications before your next appointment, please call your pharmacy*  Lab Work: None ordered If you have labs (blood work) drawn today and your tests are completely normal, you will receive your results only by: MyChart Message (if you have MyChart) OR A paper copy in the mail If you have any lab test that is abnormal or we need to change your treatment, we will call you to review the results.  Testing/Procedures: None ordered  Follow-Up: At [facility], you and your health needs are our priority.  As part of our continuing mission to provide you with exceptional heart care, our providers are all part of one team.  This team includes your primary Cardiologist (physician) and Advanced Practice Providers or APPs (Physician Assistants and Nurse Practitioners) who all work together to provide you with the care you need, when you need it.  Your next appointment:   3 month(s)  Provider:   Jerel Balding, MD    We recommend signing up for the patient portal called MyChart.  Sign up information is provided on this After Visit Summary.  MyChart is used to connect with patients for Virtual Visits (Telemedicine).  Patients are able to view lab/test results, encounter notes, upcoming appointments, etc.  Non-urgent messages can be sent to your provider as well.   To learn more about what you can  do with MyChart, go to [email or web address].        Signed, David Balding, MD  06/27/2024 2:14 PM    [facility] [address] [place], [place], [place]  [address] Phone: [phone]; Fax: [phone]

## 2024-06-26 NOTE — Patient Instructions (Signed)
 Medication Instructions:  Please take your Isosorbide  Mononitrate at 7 am (before you get out of bed in the morning) and second dose at 5 pm.  *If you need a refill on your cardiac medications before your next appointment, please call your pharmacy*  Lab Work: None ordered If you have labs (blood work) drawn today and your tests are completely normal, you will receive your results only by: MyChart Message (if you have MyChart) OR A paper copy in the mail If you have any lab test that is abnormal or we need to change your treatment, we will call you to review the results.  Testing/Procedures: None ordered  Follow-Up: At [facility], you and your health needs are our priority.  As part of our continuing mission to provide you with exceptional heart care, our providers are all part of one team.  This team includes your primary Cardiologist (physician) and Advanced Practice Providers or APPs (Physician Assistants and Nurse Practitioners) who all work together to provide you with the care you need, when you need it.  Your next appointment:   3 month(s)  Provider:   Jerel Balding, MD    We recommend signing up for the patient portal called MyChart.  Sign up information is provided on this After Visit Summary.  MyChart is used to connect with patients for Virtual Visits (Telemedicine).  Patients are able to view lab/test results, encounter notes, upcoming appointments, etc.  Non-urgent messages can be sent to your provider as well.   To learn more about what you can do with MyChart, go to [email or web address].

## 2024-06-30 ENCOUNTER — Ambulatory Visit: Payer: Medicare Other

## 2024-06-30 DIAGNOSIS — I5042 Chronic combined systolic (congestive) and diastolic (congestive) heart failure: Secondary | ICD-10-CM | POA: Diagnosis not present

## 2024-07-01 LAB — CUP PACEART REMOTE DEVICE CHECK
Battery Remaining Longevity: 36 mo
Battery Remaining Percentage: 38 %
Brady Statistic RA Percent Paced: 96 %
Brady Statistic RV Percent Paced: 98 %
Date Time Interrogation Session: 20251125022100
Implantable Lead Connection Status: 753985
Implantable Lead Connection Status: 753985
Implantable Lead Implant Date: 20170425
Implantable Lead Implant Date: 20170425
Implantable Lead Location: 753859
Implantable Lead Location: 753860
Implantable Lead Model: 7741
Implantable Lead Model: 7742
Implantable Lead Serial Number: 720495
Implantable Lead Serial Number: 763888
Implantable Pulse Generator Implant Date: 20170425
Lead Channel Impedance Value: 708 Ohm
Lead Channel Impedance Value: 795 Ohm
Lead Channel Pacing Threshold Amplitude: 0.8 V
Lead Channel Pacing Threshold Pulse Width: 0.4 ms
Lead Channel Setting Pacing Amplitude: 1.3 V
Lead Channel Setting Pacing Amplitude: 2.5 V
Lead Channel Setting Pacing Pulse Width: 0.4 ms
Lead Channel Setting Sensing Sensitivity: 2.5 mV
Pulse Gen Serial Number: 718025
Zone Setting Status: 755011

## 2024-07-03 ENCOUNTER — Ambulatory Visit: Payer: Self-pay | Admitting: Cardiovascular Disease

## 2024-07-03 ENCOUNTER — Encounter: Payer: Self-pay | Admitting: Cardiovascular Disease

## 2024-07-06 NOTE — Progress Notes (Signed)
 Remote PPM Transmission

## 2024-07-14 ENCOUNTER — Other Ambulatory Visit: Payer: Self-pay | Admitting: Cardiovascular Disease

## 2024-08-01 ENCOUNTER — Encounter: Payer: Self-pay | Admitting: Cardiovascular Disease

## 2024-08-06 ENCOUNTER — Encounter (HOSPITAL_COMMUNITY): Payer: Self-pay

## 2024-08-06 ENCOUNTER — Other Ambulatory Visit: Payer: Self-pay

## 2024-08-06 ENCOUNTER — Inpatient Hospital Stay (HOSPITAL_COMMUNITY)
Admission: EM | Admit: 2024-08-06 | Discharge: 2024-08-08 | DRG: 379 | Disposition: A | Discharge status: Home / Self Care | Attending: Internal Medicine | Admitting: Internal Medicine

## 2024-08-06 ENCOUNTER — Emergency Department (HOSPITAL_COMMUNITY)

## 2024-08-06 DIAGNOSIS — C859A Non-Hodgkin lymphoma, unspecified, in remission: Secondary | ICD-10-CM | POA: Diagnosis present

## 2024-08-06 DIAGNOSIS — D649 Anemia, unspecified: Secondary | ICD-10-CM

## 2024-08-06 DIAGNOSIS — Z888 Allergy status to other drugs, medicaments and biological substances status: Secondary | ICD-10-CM

## 2024-08-06 DIAGNOSIS — Z833 Family history of diabetes mellitus: Secondary | ICD-10-CM

## 2024-08-06 DIAGNOSIS — E785 Hyperlipidemia, unspecified: Secondary | ICD-10-CM | POA: Diagnosis present

## 2024-08-06 DIAGNOSIS — K219 Gastro-esophageal reflux disease without esophagitis: Secondary | ICD-10-CM | POA: Diagnosis present

## 2024-08-06 DIAGNOSIS — Z7982 Long term (current) use of aspirin: Secondary | ICD-10-CM

## 2024-08-06 DIAGNOSIS — Z8249 Family history of ischemic heart disease and other diseases of the circulatory system: Secondary | ICD-10-CM

## 2024-08-06 DIAGNOSIS — Z83438 Family history of other disorder of lipoprotein metabolism and other lipidemia: Secondary | ICD-10-CM

## 2024-08-06 DIAGNOSIS — I2510 Atherosclerotic heart disease of native coronary artery without angina pectoris: Secondary | ICD-10-CM | POA: Diagnosis present

## 2024-08-06 DIAGNOSIS — Z951 Presence of aortocoronary bypass graft: Secondary | ICD-10-CM

## 2024-08-06 DIAGNOSIS — I252 Old myocardial infarction: Secondary | ICD-10-CM

## 2024-08-06 DIAGNOSIS — K648 Other hemorrhoids: Secondary | ICD-10-CM | POA: Diagnosis present

## 2024-08-06 DIAGNOSIS — Z950 Presence of cardiac pacemaker: Secondary | ICD-10-CM

## 2024-08-06 DIAGNOSIS — K625 Hemorrhage of anus and rectum: Secondary | ICD-10-CM | POA: Diagnosis present

## 2024-08-06 DIAGNOSIS — Z8572 Personal history of non-Hodgkin lymphomas: Secondary | ICD-10-CM

## 2024-08-06 DIAGNOSIS — Z882 Allergy status to sulfonamides status: Secondary | ICD-10-CM

## 2024-08-06 DIAGNOSIS — Z955 Presence of coronary angioplasty implant and graft: Secondary | ICD-10-CM

## 2024-08-06 DIAGNOSIS — E739 Lactose intolerance, unspecified: Secondary | ICD-10-CM | POA: Diagnosis present

## 2024-08-06 DIAGNOSIS — Z79899 Other long term (current) drug therapy: Secondary | ICD-10-CM

## 2024-08-06 DIAGNOSIS — Z7984 Long term (current) use of oral hypoglycemic drugs: Secondary | ICD-10-CM

## 2024-08-06 DIAGNOSIS — I255 Ischemic cardiomyopathy: Secondary | ICD-10-CM | POA: Diagnosis present

## 2024-08-06 DIAGNOSIS — H409 Unspecified glaucoma: Secondary | ICD-10-CM | POA: Diagnosis present

## 2024-08-06 DIAGNOSIS — Z87891 Personal history of nicotine dependence: Secondary | ICD-10-CM

## 2024-08-06 DIAGNOSIS — F32A Depression, unspecified: Secondary | ICD-10-CM | POA: Diagnosis present

## 2024-08-06 DIAGNOSIS — I739 Peripheral vascular disease, unspecified: Secondary | ICD-10-CM | POA: Diagnosis present

## 2024-08-06 DIAGNOSIS — I10 Essential (primary) hypertension: Secondary | ICD-10-CM | POA: Diagnosis present

## 2024-08-06 DIAGNOSIS — K922 Gastrointestinal hemorrhage, unspecified: Principal | ICD-10-CM

## 2024-08-06 DIAGNOSIS — K5731 Diverticulosis of large intestine without perforation or abscess with bleeding: Principal | ICD-10-CM | POA: Diagnosis present

## 2024-08-06 DIAGNOSIS — Z823 Family history of stroke: Secondary | ICD-10-CM

## 2024-08-06 LAB — COMPREHENSIVE METABOLIC PANEL WITH GFR
ALT: 19 U/L (ref 0–44)
AST: 27 U/L (ref 15–41)
Albumin: 4.4 g/dL (ref 3.5–5.0)
Alkaline Phosphatase: 57 U/L (ref 38–126)
Anion gap: 12 (ref 5–15)
BUN: 38 mg/dL — ABNORMAL HIGH (ref 8–23)
CO2: 24 mmol/L (ref 22–32)
Calcium: 9.1 mg/dL (ref 8.9–10.3)
Chloride: 101 mmol/L (ref 98–111)
Creatinine, Ser: 1.48 mg/dL — ABNORMAL HIGH (ref 0.61–1.24)
GFR, Estimated: 45 mL/min — ABNORMAL LOW
Glucose, Bld: 110 mg/dL — ABNORMAL HIGH (ref 70–99)
Potassium: 5 mmol/L (ref 3.5–5.1)
Sodium: 137 mmol/L (ref 135–145)
Total Bilirubin: 0.4 mg/dL (ref 0.0–1.2)
Total Protein: 6.7 g/dL (ref 6.5–8.1)

## 2024-08-06 LAB — PREPARE RBC (CROSSMATCH)

## 2024-08-06 LAB — CBC
HCT: 36.2 % — ABNORMAL LOW (ref 39.0–52.0)
Hemoglobin: 11.4 g/dL — ABNORMAL LOW (ref 13.0–17.0)
MCH: 31.4 pg (ref 26.0–34.0)
MCHC: 31.5 g/dL (ref 30.0–36.0)
MCV: 99.7 fL (ref 80.0–100.0)
Platelets: 180 K/uL (ref 150–400)
RBC: 3.63 MIL/uL — ABNORMAL LOW (ref 4.22–5.81)
RDW: 13.7 % (ref 11.5–15.5)
WBC: 7.1 K/uL (ref 4.0–10.5)
nRBC: 0 % (ref 0.0–0.2)

## 2024-08-06 LAB — ABO/RH: ABO/RH(D): A POS

## 2024-08-06 LAB — POC OCCULT BLOOD, ED: Fecal Occult Blood, POC: POSITIVE — AB

## 2024-08-06 MED ORDER — IOHEXOL 350 MG/ML SOLN
75.0000 mL | Freq: Once | INTRAVENOUS | Status: AC | PRN
  Administered 2024-08-06: 75 mL via INTRAVENOUS

## 2024-08-06 MED ORDER — LACTATED RINGERS IV BOLUS: 500.0000 mL | Freq: Once | INTRAVENOUS | Status: DC

## 2024-08-06 MED ORDER — SODIUM CHLORIDE 0.9% IV SOLUTION: Freq: Once | INTRAVENOUS | Status: AC

## 2024-08-06 MED ORDER — SODIUM CHLORIDE 0.9 % IV BOLUS
500.0000 mL | Freq: Once | INTRAVENOUS | Status: AC
  Administered 2024-08-06: 500 mL via INTRAVENOUS

## 2024-08-06 NOTE — ED Triage Notes (Signed)
 QTC: Patient reports rectal bleeding. Ambulatory in the waiting room.

## 2024-08-06 NOTE — ED Triage Notes (Signed)
 Pt reports rectal bleeding that started 1 hour ago, when bearing down to have a BM. Pt reports bright red blood, Not on blood thinner.   Reports similar episode 6 months ago, Evaluated by GI, told diverticulitis, self resolved.   Pt denies abdominal pain, N/V, Dizziness

## 2024-08-06 NOTE — ED Provider Triage Note (Signed)
 Emergency Medicine Provider Triage Evaluation Note  David [name] , a 89 y.o. male  was evaluated in triage.  Pt complains of rectal bleeding that started approximately 1 hour ago.  Patient states he does have a history of diverticulitis that occurred approximately 6 months ago, but this is worse than previous.  He denies any pain.  He does have significant bright red blood per rectum that occurs with bearing down.  He denies any shortness of breath, nausea or vomiting.  Vital signs are stable.  Review of Systems  Positive: Rectal bleeding Negative:   Physical Exam  BP 116/66 (BP Location: Right Arm)   Pulse 67   Temp 97.8 F (36.6 C)   Resp 18   Ht 5' 9 (1.753 m)   Wt 83.9 kg   SpO2 98%   BMI 27.32 kg/m  Gen:   Awake, no distress   Resp:  Normal effort  MSK:   Moves extremities without difficulty  Other:  Abdomen nontender  Medical Decision Making  Medically screening exam initiated at 7:47 PM.  Appropriate orders placed.  David [name] was informed that the remainder of the evaluation will be completed by another provider, this initial triage assessment does not replace that evaluation, and the importance of remaining in the ED until their evaluation is complete.  Labs, CT, type and screen ordered.  Patient did use the bathroom while in triage, and there was a moderate amount of bright red blood noted in the toilet.   David [name], [place] 08/06/24 [phone]

## 2024-08-06 NOTE — ED Provider Notes (Signed)
 " [name] AT [facility] Provider Note   CSN: [id number] Arrival date & time: 08/06/24  8061     Patient presents with: Rectal Bleeding   David [name] is a 89 y.o. male.   Patient is an 89 year old male who presents to the emergency department with a chief complaint of rectal bleeding which has been ongoing since earlier this evening.  Patient notes that he has had 4 bright red bloody bowel movements.  He notes that he has had no associated abdominal pain.  He denies any nausea, vomiting.  He denies any changes in urination to include dysuria or hematuria.  He has had no chest pain or shortness of breath.  He denies any dizziness, lightheadedness or syncope.  He does note that he had a bout similar to this a few months ago and was secondary to diverticulitis.   Rectal Bleeding      Prior to Admission medications  Medication Sig Start Date End Date Taking? Authorizing Provider  aspirin  EC 81 MG tablet Take 81 mg by mouth daily.    [provider]  empagliflozin  (JARDIANCE ) 10 MG TABS tablet TAKE 1 TABLET BY MOUTH DAILY 07/15/24   Croitoru, Mihai, MD  fluticasone  (FLONASE ) 50 MCG/ACT nasal spray Place 2 sprays into both nostrils daily. 05/14/24   Masciello, Hadassah BROCKS, MD  furosemide  (LASIX ) 40 MG tablet Take 1 tablet (40 mg total) by mouth every other day. 03/26/24   Croitoru, Mihai, MD  icosapent  Ethyl (VASCEPA ) 1 g capsule TAKE TWO CAPSULES BY MOUTH TWICE DAILY 05/22/24   Croitoru, Mihai, MD  isosorbide  mononitrate (IMDUR ) 30 MG 24 hr tablet TAKE 3 TABLETS BY MOUTH TWICE  DAILY 01/30/24   West, Katlyn D, NP  metoprolol  (TOPROL -XL) 200 MG 24 hr tablet Take 1 tablet (200 mg total) by mouth daily. Take with or immediately following a meal. 01/08/24   Croitoru, Mihai, MD  nitroGLYCERIN  (NITROSTAT ) 0.4 MG SL tablet PLACE 1 TABLET UNDER THE TONGUE EVERY 5 MINUTES AS NEEDED FOR CHEST PAIN 02/21/24   Croitoru, Mihai, MD  rosuvastatin  (CRESTOR ) 40 MG tablet TAKE 1  TABLET BY MOUTH AT  BEDTIME 05/06/24   Croitoru, Mihai, MD  sacubitril -valsartan  (ENTRESTO ) 24-26 MG Take 1 tablet by mouth 2 (two) times daily. Start taking 48 hours after stopping Lisinopril . 01/08/24   Croitoru, Mihai, MD  spironolactone  (ALDACTONE ) 25 MG tablet Take 0.5 tablets (12.5 mg total) by mouth daily. 03/26/24   Croitoru, Mihai, MD  triamcinolone  cream (KENALOG ) 0.1 % Apply 1 Application topically as needed. 04/22/23   [provider]    Allergies: Azithromycin, Lactose intolerance (gi), Niacin and related, and Sulfa antibiotics    Review of Systems  Gastrointestinal:  Positive for anal bleeding and hematochezia.  All other systems reviewed and are negative.   Updated Vital Signs BP (!) 101/52   Pulse 60   Temp 97.9 F (36.6 C) (Oral)   Resp 16   Ht 5' 9 (1.753 m)   Wt 83.9 kg   SpO2 98%   BMI 27.32 kg/m   Physical Exam Vitals and nursing note reviewed.  Constitutional:      General: He is not in acute distress.    Appearance: Normal appearance. He is not ill-appearing.  HENT:     Head: Normocephalic and atraumatic.     Nose: Nose normal.     Mouth/Throat:     Mouth: Mucous membranes are moist.  Eyes:     Extraocular Movements: Extraocular movements intact.  Conjunctiva/sclera: Conjunctivae normal.     Pupils: Pupils are equal, round, and reactive to light.  Cardiovascular:     Rate and Rhythm: Normal rate and regular rhythm.     Pulses: Normal pulses.     Heart sounds: Normal heart sounds. No murmur heard.    No gallop.  Pulmonary:     Effort: Pulmonary effort is normal. No respiratory distress.     Breath sounds: Normal breath sounds. No stridor. No wheezing, rhonchi or rales.  Abdominal:     General: Abdomen is flat. Bowel sounds are normal. There is no distension.     Palpations: Abdomen is soft.     Tenderness: There is no abdominal tenderness. There is no guarding.  Musculoskeletal:        General: Normal range of motion.     Cervical  back: Normal range of motion and neck supple. No rigidity or tenderness.  Skin:    General: Skin is warm and dry.  Neurological:     General: No focal deficit present.     Mental Status: He is alert and oriented to person, place, and time. Mental status is at baseline.  Psychiatric:        Mood and Affect: Mood normal.        Behavior: Behavior normal.        Thought Content: Thought content normal.        Judgment: Judgment normal.     (all labs ordered are listed, but only abnormal results are displayed) Labs Reviewed  COMPREHENSIVE METABOLIC PANEL WITH GFR - Abnormal; Notable for the following components:      Result Value   Glucose, Bld 110 (*)    BUN 38 (*)    Creatinine, Ser 1.48 (*)    GFR, Estimated 45 (*)    All other components within normal limits  CBC - Abnormal; Notable for the following components:   RBC 3.63 (*)    Hemoglobin 11.4 (*)    HCT 36.2 (*)    All other components within normal limits  POC OCCULT BLOOD, ED - Abnormal; Notable for the following components:   Fecal Occult Blood, POC Positive (*)    All other components within normal limits  TYPE AND SCREEN  PREPARE RBC (CROSSMATCH)    EKG: None  Radiology: No results found.   Procedures   Medications Ordered in the ED  0.9 %  sodium chloride  infusion (Manually program via Guardrails IV Fluids) ( Intravenous New Bag/Given 08/06/24 2059)  sodium chloride  0.9 % bolus 500 mL (500 mLs Intravenous New Bag/Given 08/06/24 2047)                                    Medical Decision Making Amount and/or Complexity of Data Reviewed Labs: ordered. Radiology: ordered. ECG/medicine tests: ordered.  Risk Prescription drug management. Decision regarding hospitalization.   This patient presents to the ED for concern of rectal bleeding, this involves an extensive number of treatment options, and is a complaint that carries with it a high risk of complications and morbidity.  The differential diagnosis  includes lower GI bleed, upper GI bleed, diverticulitis, colitis   Co morbidities that complicate the patient evaluation  CAD, complete heart block, hypertension, hyperlipidemia   Additional history obtained:  Additional history obtained from family External records from outside source obtained and reviewed including medical records   Lab Tests:  I Ordered, and personally interpreted  labs.  The pertinent results include: No leukocytosis, mild worsening anemia, elevated creatinine, unremarkable electrolytes, normal liver function, Hemoccult positive   Imaging Studies ordered:  I ordered imaging studies including CTA abdomen and pelvis I independently visualized and interpreted imaging which showed no indication for active extravasation, wall thickening of the transverse colon I agree with the radiologist interpretation   Cardiac Monitoring: / EKG:  The patient was maintained on a cardiac monitor.  I personally viewed and interpreted the cardiac monitored which showed an underlying rhythm of: Sinus rhythm, no ST/T wave changes, no ischemic changes, no STEMI   Consultations Obtained:  I requested consultation with the hospitalist, gastroenterology,  and discussed lab and imaging findings as well as pertinent plan - they recommend: Admission with gastroenterology to consult   Problem List / ED Course / Critical interventions / Medication management  Patient is doing well at this time and does remain stable.  Blood pressure has greatly improved with blood transfusion.  CTA of the abdomen and pelvis demonstrated no signs of active extravasation.  He did have some inflammation within the proximal transverse colon.  Will plan for admission to the hospitalist service at this time.  Gastroenterology was made aware of the patient to see in consult.  He does have a mild worsening anemia.  Have discussed patient case with Dr. Dena with the hospitalist service who has excepted for  admission. I ordered medication including IV fluids, packed red blood cells for GI bleed and hypotension Reevaluation of the patient after these medicines showed that the patient improved I have reviewed the patients home medicines and have made adjustments as needed   Social Determinants of Health:  None   Test / Admission - Considered:  Admission     Final diagnoses:  None    ED Discharge Orders     None          Daralene Lonni JONETTA DEVONNA 08/07/24 0026    Patt Alm Macho, MD 08/10/24 2316  "

## 2024-08-06 NOTE — ED Notes (Signed)
 Unit of blood started at 2050. Unit number [id number] P.

## 2024-08-06 NOTE — ED Notes (Signed)
 Pt to bathroom, moderate to large amount of bright red blood noted in toilet, PA aware

## 2024-08-07 DIAGNOSIS — K625 Hemorrhage of anus and rectum: Secondary | ICD-10-CM

## 2024-08-07 LAB — TYPE AND SCREEN
ABO/RH(D): A POS
Antibody Screen: NEGATIVE
Unit division: 0
Unit division: 0

## 2024-08-07 LAB — CBC WITH DIFFERENTIAL/PLATELET
Abs Immature Granulocytes: 0.03 K/uL (ref 0.00–0.07)
Basophils Absolute: 0 K/uL (ref 0.0–0.1)
Basophils Relative: 0 %
Eosinophils Absolute: 0.1 K/uL (ref 0.0–0.5)
Eosinophils Relative: 2 %
HCT: 35.7 % — ABNORMAL LOW (ref 39.0–52.0)
Hemoglobin: 11.6 g/dL — ABNORMAL LOW (ref 13.0–17.0)
Immature Granulocytes: 0 %
Lymphocytes Relative: 13 %
Lymphs Abs: 0.9 K/uL (ref 0.7–4.0)
MCH: 31.7 pg (ref 26.0–34.0)
MCHC: 32.5 g/dL (ref 30.0–36.0)
MCV: 97.5 fL (ref 80.0–100.0)
Monocytes Absolute: 0.7 K/uL (ref 0.1–1.0)
Monocytes Relative: 10 %
Neutro Abs: 5.1 K/uL (ref 1.7–7.7)
Neutrophils Relative %: 75 %
Platelets: 149 K/uL — ABNORMAL LOW (ref 150–400)
RBC: 3.66 MIL/uL — ABNORMAL LOW (ref 4.22–5.81)
RDW: 14.2 % (ref 11.5–15.5)
WBC: 6.8 K/uL (ref 4.0–10.5)
nRBC: 0 % (ref 0.0–0.2)

## 2024-08-07 LAB — BPAM RBC
Blood Product Expiration Date: 202601282359
Blood Product Expiration Date: 202602012359
ISSUE DATE / TIME: 202601012047
ISSUE DATE / TIME: 202601020925
Unit Type and Rh: 5100
Unit Type and Rh: 5100

## 2024-08-07 MED ORDER — ROSUVASTATIN CALCIUM 20 MG PO TABS
40.0000 mg | ORAL_TABLET | Freq: Every day | ORAL | Status: DC
  Administered 2024-08-07: 40 mg via ORAL
  Filled 2024-08-07: qty 2

## 2024-08-07 MED ORDER — OXYCODONE HCL 5 MG PO TABS: 5.0000 mg | ORAL_TABLET | ORAL | Status: DC | PRN

## 2024-08-07 MED ORDER — ONDANSETRON HCL 4 MG/2ML IJ SOLN: 4.0000 mg | Freq: Four times a day (QID) | INTRAMUSCULAR | Status: DC | PRN

## 2024-08-07 MED ORDER — ACETAMINOPHEN 325 MG PO TABS: 650.0000 mg | ORAL_TABLET | Freq: Four times a day (QID) | ORAL | Status: DC | PRN

## 2024-08-07 MED ORDER — LACTATED RINGERS IV SOLN: INTRAVENOUS | Status: AC

## 2024-08-07 MED ORDER — ENOXAPARIN SODIUM 40 MG/0.4ML IJ SOSY: 40.0000 mg | PREFILLED_SYRINGE | INTRAMUSCULAR | Status: DC

## 2024-08-07 MED ORDER — SPIRONOLACTONE 12.5 MG HALF TABLET
12.5000 mg | ORAL_TABLET | Freq: Every day | ORAL | Status: DC
  Administered 2024-08-07 – 2024-08-08 (×2): 12.5 mg via ORAL
  Filled 2024-08-07 (×2): qty 1

## 2024-08-07 MED ORDER — ACETAMINOPHEN 650 MG RE SUPP: 650.0000 mg | Freq: Four times a day (QID) | RECTAL | Status: DC | PRN

## 2024-08-07 MED ORDER — METOPROLOL SUCCINATE ER 100 MG PO TB24
200.0000 mg | ORAL_TABLET | Freq: Every day | ORAL | Status: DC
  Administered 2024-08-07 – 2024-08-08 (×2): 200 mg via ORAL
  Filled 2024-08-07: qty 8
  Filled 2024-08-07: qty 2

## 2024-08-07 MED ORDER — ASPIRIN 81 MG PO TBEC
81.0000 mg | DELAYED_RELEASE_TABLET | Freq: Every day | ORAL | Status: DC
  Administered 2024-08-07 – 2024-08-08 (×2): 81 mg via ORAL
  Filled 2024-08-07 (×2): qty 1

## 2024-08-07 MED ORDER — ISOSORBIDE MONONITRATE ER 60 MG PO TB24
90.0000 mg | ORAL_TABLET | Freq: Two times a day (BID) | ORAL | Status: DC
  Administered 2024-08-07 – 2024-08-08 (×3): 90 mg via ORAL
  Filled 2024-08-07: qty 3
  Filled 2024-08-07 (×2): qty 1

## 2024-08-07 MED ORDER — NITROGLYCERIN 0.4 MG SL SUBL
0.4000 mg | SUBLINGUAL_TABLET | SUBLINGUAL | Status: DC | PRN
  Administered 2024-08-07: 0.4 mg via SUBLINGUAL
  Filled 2024-08-07 (×2): qty 1

## 2024-08-07 MED ORDER — SACUBITRIL-VALSARTAN 24-26 MG PO TABS
1.0000 | ORAL_TABLET | Freq: Two times a day (BID) | ORAL | Status: DC
  Administered 2024-08-07 – 2024-08-08 (×3): 1 via ORAL
  Filled 2024-08-07 (×4): qty 1

## 2024-08-07 MED ORDER — ONDANSETRON HCL 4 MG PO TABS: 4.0000 mg | ORAL_TABLET | Freq: Four times a day (QID) | ORAL | Status: DC | PRN

## 2024-08-07 NOTE — Consult Note (Signed)
 [facility] Gastroenterology Consult  Referring Provider: No ref. provider found Primary Care Physician:  Loreli Kins, MD Primary Gastroenterologist: [facility] Kr[facility]  Reason for Consultation: Painless hematochezia  SUBJECTIVE:   HPI: David [name] is a 89 y.o. male with past medical history significant for coronary artery disease status post CABG and cardiac stents, ischemic cardiomyopathy, complete heart block status post PPM.  Seen and evaluated by myself in office in June 2025 for episode of nonsustained, painless hematochezia which resolved spontaneously.  Small internal hemorrhoids were noted on rectal exam at that time.  Last colonoscopy was completed 03/09/2003 and was normal to the cecum.  We discussed colonoscopy at that time though he was apprehensive to proceed given his heart history.  Patient seen and evaluated in the emergency department.  Wife present at bedside.  Noted that he began to have painless, large-volume blood per rectum yesterday evening after eating dinner.  He had additional episode which was again, large in volume and he presented to the emergency department.  He has been tolerating clear liquids since his presentation.  He has not had further bowel movement/blood per rectum.  No chest pain or shortness of breath.  No nausea or vomiting.  CT angiogram 08/06/2024 showed mild focal wall thickening of the proximal transverse colon indeterminant, sigmoid colon diverticulosis.  Hemoglobin 11.4, PLT 180, WBC 7.1.  Past Medical History:  Diagnosis Date   Arthritis    hands (10/19/2013)   Bilateral carotid bruits 04/08/2020   Right greater than left   Bladder tumor    CAD (coronary artery disease)    cardiologist-  dr crouitoru   Carotid artery disease    moderate pRICA 60-79%,  1-39% LICA , >50% RECA last duplex 08-05-2015   Cataract of both eyes    Complete heart block (HCC)    GERD (gastroesophageal reflux disease)    Glaucoma, both eyes    Hematuria     History of MI (myocardial infarction)    1989   Hyperlipidemia    Hypertension    Ischemic cardiomyopathy    ef 40-45%  without clinical heart failure per cardiology note 12-28-2015 (nuclear stress ef 43% from 07-28-2015)   Left ventricular dysfunction    Non Hodgkin's lymphoma (HCC) dx 06/2010---  oncologist-  dr cloretta---  in clinical remission since 2013   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 -- lymphadenopathy to neck, mediastinal , and abdominal--  chemotherapy completed 06/ 2011   NSVT (nonsustained ventricular tachycardia) [facility]) cardiologist-  dr crouitoru   Max rate 200 bpm, 28-beat, nearly 9-second episode recorded by pacemaker on November 15, asymptomatic    Pacemaker pacemaker dependant due to CHB   1st Pacemaker Placement 1998 w/ New generator  12-31-2000 ; 08-04-2008;  11-29-2015   PAD (peripheral artery disease)    followed by dr berry--- last duplex 08-05-2015  patents bilateral popliteal stents, bilateral SFA 30-49% stenosis   Peripheral vascular disease with claudication    dx 2009 left popliteal occlusion with collaterals; 03- 2015  s/p  stenting right popliteal  by dr berry and left popliteal stenting by dr myra ([facility]) july 2015   S/P CABG x 5    09-10-1988   S/P drug eluting coronary stent placement 01/04/2006   01-04-2006--- DES to SVG to OM1, OM2, and IR, and SVG to PDA(x2 grafts)  and 03-04-2013  DES to SVR to OM   Wears glasses    Past Surgical History:  Procedure Laterality Date   ANAL  FISSURE REPAIR  12-09-2000 and 1970's   CARDIAC CATHETERIZATION  04/09/2000   patent grafts   CARDIAC PACEMAKER PLACEMENT  11-15-1993   dr lucas   CARDIOVASCULAR STRESS TEST  07-28-2015  dr crouitoru   Intermediate risk nuclear study w/ large, severe, predominantly fixed inferior and apical defect; minimal reversibility inferior lateral wall; findings consistent w/ large prior infarct and trivial peri-infarct ischemia;  ef 43% w/ inferior  hypokinesis and apical akinesis; mild LVE   CORONARY ANGIOPLASTY WITH STENT PLACEMENT  01-04-2006  dr gamble   DES to pSVG to OM1, SVG to OM2 &  IR, and mSVG to RCA dPDA (x2 grafts );  pLAD , pLCFX and  mRCA occluded;  LIMA to LAD mid-distal patent with occlusion beyond LIMA with collateral flow;  ef 30%   CORONARY ARTERY BYPASS GRAFT  09/10/1988   dr tanda   CABG X5 (03/04/2013) LIMA to LAD,SVG to imtermediate OM1 OM II,SVG to PDA   CORONARY BALLOON ANGIOPLASTY N/A 03/05/2023   Procedure: CORONARY BALLOON ANGIOPLASTY;  Surgeon: Dann Candyce RAMAN, MD;  Location: [facility] INVASIVE CV LAB;  Service: Cardiovascular;  Laterality: N/A;   CYSTOSCOPY W/ RETROGRADES Bilateral 04/13/2016   Procedure: CYSTOSCOPY WITH RETROGRADE PYELOGRAM;  Surgeon: Ricardo Likens, MD;  Location: [facility];  Service: Urology;  Laterality: Bilateral;   EP IMPLANTABLE DEVICE N/A 11/29/2015   Procedure: Pacemaker Implant;  Surgeon: Jerel Balding, MD;  Location: [facility] INVASIVE CV LAB;  Service: Cardiovascular;  Laterality: N/A;  new generator and new atrial, vertricular leads;  [employer] Accolade   LEFT HEART CATH AND CORS/GRAFTS ANGIOGRAPHY N/A 03/05/2023   Procedure: LEFT HEART CATH AND CORS/GRAFTS ANGIOGRAPHY;  Surgeon: Dann Candyce RAMAN, MD;  Location: [facility] INVASIVE CV LAB;  Service: Cardiovascular;  Laterality: N/A;   LEFT HEART CATHETERIZATION WITH CORONARY/GRAFT ANGIOGRAM N/A 03/04/2013   Procedure: LEFT HEART CATHETERIZATION WITH EL BILE;  Surgeon: Debby DELENA Sor, MD;  Location: [facility] CATH LAB;  Service: Cardiovascular;  Laterality: N/A;   LOWER EXTREMITY ANGIOGRAM N/A 08/31/2013   Procedure: LOWER EXTREMITY ANGIOGRAM;  Surgeon: Dorn JINNY Lesches, MD;  Location: [facility] CATH LAB;  Service: Cardiovascular;  Laterality: N/A;   PACEMAKER REPLACEMENT  12/31/2000;  08-04-2008   PERCUTANEOUS CORONARY STENT INTERVENTION (PCI-S)  03/04/2013   Procedure: PERCUTANEOUS CORONARY STENT INTERVENTION (PCI-S);   Surgeon: Debby DELENA Sor, MD;  Location: [facility] CATH LAB;  Service: Cardiovascular;;  DES to S-OM for in-stent restenosis   POPLITEAL ARTERY STENT Right 10-21-2013  dr berry   atherectomy, PTA , and IDEV stent x1   POPLITEAL ARTERY STENT Left 07/ 2015  [facility]   angioplasty / stent x2   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   SUPERFICIAL LYMPH NODE BIOPSY / EXCISION Left 2011   cervical lymph node   TRANSURETHRAL RESECTION OF BLADDER TUMOR N/A 04/13/2016   Procedure: TRANSURETHRAL RESECTION OF BLADDER TUMOR (TURBT) with clot evacuation;  Surgeon: Ricardo Likens, MD;  Location: [facility];  Service: Urology;  Laterality: N/A;   VENOGRAM Left 11/29/2015   Procedure: Venogram;  Surgeon: Jerel Balding, MD;  Location: [facility] INVASIVE CV LAB;  Service: Cardiovascular;  Laterality: Left;   Prior to Admission medications  Medication Sig Start Date End Date Taking? Authorizing Provider  aspirin  EC 81 MG tablet Take 81 mg by mouth daily.   Yes [provider]  empagliflozin  (JARDIANCE ) 10 MG TABS tablet TAKE 1 TABLET BY MOUTH DAILY 07/15/24  Yes Croitoru, Jerel, MD  fluticasone  (  FLONASE ) 50 MCG/ACT nasal spray Place 2 sprays into both nostrils daily. 05/14/24  Yes Masciello, Hadassah BROCKS, MD  furosemide  (LASIX ) 40 MG tablet Take 1 tablet (40 mg total) by mouth every other day. 03/26/24  Yes Croitoru, Mihai, MD  icosapent  Ethyl (VASCEPA ) 1 g capsule TAKE TWO CAPSULES BY MOUTH TWICE DAILY 05/22/24  Yes Croitoru, Mihai, MD  isosorbide  mononitrate (IMDUR ) 30 MG 24 hr tablet TAKE 3 TABLETS BY MOUTH TWICE  DAILY 01/30/24  Yes Devora, Katlyn D, NP  metoprolol  (TOPROL -XL) 200 MG 24 hr tablet Take 1 tablet (200 mg total) by mouth daily. Take with or immediately following a meal. 01/08/24  Yes Croitoru, Mihai, MD  nitroGLYCERIN  (NITROSTAT ) 0.4 MG SL tablet PLACE 1 TABLET UNDER THE TONGUE EVERY 5 MINUTES AS NEEDED FOR CHEST PAIN 02/21/24  Yes Croitoru, Mihai, MD  rosuvastatin   (CRESTOR ) 40 MG tablet TAKE 1 TABLET BY MOUTH AT  BEDTIME 05/06/24  Yes Croitoru, Mihai, MD  sacubitril -valsartan  (ENTRESTO ) 24-26 MG Take 1 tablet by mouth 2 (two) times daily. Start taking 48 hours after stopping Lisinopril . 01/08/24  Yes Croitoru, Mihai, MD  spironolactone  (ALDACTONE ) 25 MG tablet Take 0.5 tablets (12.5 mg total) by mouth daily. 03/26/24  Yes Croitoru, Mihai, MD  triamcinolone  cream (KENALOG ) 0.1 % Apply 1 Application topically as needed. 04/22/23  Yes [provider]   Current Facility-Administered Medications  Medication Dose Route Frequency Provider Last Rate Last Admin   acetaminophen  (TYLENOL ) tablet 650 mg  650 mg Oral Q6H PRN Dena Charleston, MD       Or   acetaminophen  (TYLENOL ) suppository 650 mg  650 mg Rectal Q6H PRN Dena Charleston, MD       aspirin  EC tablet 81 mg  81 mg Oral Daily Dorrell, Robert, MD   81 mg at 08/07/24 1031   isosorbide  mononitrate (IMDUR ) 24 hr tablet 90 mg  90 mg Oral BID Dena Charleston, MD   90 mg at 08/07/24 1030   lactated ringers  infusion   Intravenous Continuous Dena Charleston, MD 75 mL/hr at 08/07/24 1146 Rate Verify at 08/07/24 1146   metoprolol  succinate (TOPROL -XL) 24 hr tablet 200 mg  200 mg Oral Daily Dorrell, Charleston, MD   200 mg at 08/07/24 1029   ondansetron  (ZOFRAN ) tablet 4 mg  4 mg Oral Q6H PRN Dena Charleston, MD       Or   ondansetron  (ZOFRAN ) injection 4 mg  4 mg Intravenous Q6H PRN Dorrell, Robert, MD       oxyCODONE  (Oxy IR/ROXICODONE ) immediate release tablet 5 mg  5 mg Oral Q4H PRN Dorrell, Robert, MD       rosuvastatin  (CRESTOR ) tablet 40 mg  40 mg Oral QHS Dena Charleston, MD       sacubitril -valsartan  (ENTRESTO ) 24-26 mg per tablet  1 tablet Oral BID Dena Charleston, MD   1 tablet at 08/07/24 1031   spironolactone  (ALDACTONE ) tablet 12.5 mg  12.5 mg Oral Daily Dorrell, Charleston, MD   12.5 mg at 08/07/24 1030   Current Outpatient Medications  Medication Sig Dispense Refill   aspirin  EC 81 MG tablet Take 81 mg  by mouth daily.     empagliflozin  (JARDIANCE ) 10 MG TABS tablet TAKE 1 TABLET BY MOUTH DAILY 90 tablet 0   fluticasone  (FLONASE ) 50 MCG/ACT nasal spray Place 2 sprays into both nostrils daily. 16 g 6   furosemide  (LASIX ) 40 MG tablet Take 1 tablet (40 mg total) by mouth every other day. 75 tablet 3   icosapent  Ethyl (VASCEPA ) 1  g capsule TAKE TWO CAPSULES BY MOUTH TWICE DAILY 360 capsule 3   isosorbide  mononitrate (IMDUR ) 30 MG 24 hr tablet TAKE 3 TABLETS BY MOUTH TWICE  DAILY 180 tablet 11   metoprolol  (TOPROL -XL) 200 MG 24 hr tablet Take 1 tablet (200 mg total) by mouth daily. Take with or immediately following a meal. 10 tablet 0   nitroGLYCERIN  (NITROSTAT ) 0.4 MG SL tablet PLACE 1 TABLET UNDER THE TONGUE EVERY 5 MINUTES AS NEEDED FOR CHEST PAIN 25 tablet 10   rosuvastatin  (CRESTOR ) 40 MG tablet TAKE 1 TABLET BY MOUTH AT  BEDTIME 90 tablet 3   sacubitril -valsartan  (ENTRESTO ) 24-26 MG Take 1 tablet by mouth 2 (two) times daily. Start taking 48 hours after stopping Lisinopril . 20 tablet 0   spironolactone  (ALDACTONE ) 25 MG tablet Take 0.5 tablets (12.5 mg total) by mouth daily. 45 tablet 3   triamcinolone  cream (KENALOG ) 0.1 % Apply 1 Application topically as needed.     Allergies as of 08/06/2024 - Review Complete 08/06/2024  Allergen Reaction Noted   Azithromycin Diarrhea 02/25/2013   Lactose intolerance (gi) Diarrhea 05/28/2013   Niacin and related Diarrhea 02/25/2013   Sulfa antibiotics Rash 10/26/2011   Family History  Problem Relation Age of Onset   Diabetes Mother    Stroke Mother    Heart attack Father    Hypertension Sister    Hyperlipidemia Sister    Hypertension Sister    Hyperlipidemia Sister    Social History   Socioeconomic History   Marital status: Married    Spouse name: Not on file   Number of children: 3   Years of education: Not on file   Highest education level: Not on file  Occupational History   Not on file  Tobacco Use   Smoking status: Former     Current packs/day: 0.00    Average packs/day: 0.1 packs/day for 1 year (0.1 ttl pk-yrs)    Types: Cigarettes    Start date: 08/07/1955    Quit date: 08/06/1956    Years since quitting: 68.0   Smokeless tobacco: Never  Vaping Use   Vaping status: Never Used  Substance and Sexual Activity   Alcohol  use: No   Drug use: No   Sexual activity: Never  Other Topics Concern   Not on file  Social History Narrative   Not on file   Social Drivers of Health   Tobacco Use: Medium Risk (08/06/2024)   Patient History    Smoking Tobacco Use: Former    Smokeless Tobacco Use: Never    Passive Exposure: Not on Actuary Strain: Not on file  Food Insecurity: Not on file  Transportation Needs: Not on file  Physical Activity: Not on file  Stress: Not on file  Social Connections: Not on file  Intimate Partner Violence: Not on file  Depression (PHQ2-9): Low Risk (02/17/2024)   Depression (PHQ2-9)    PHQ-2 Score: 0  Alcohol  Screen: Not on file  Housing: Not on file  Utilities: Not on file  Health Literacy: Not on file   Review of Systems:  Review of Systems  Respiratory:  Negative for shortness of breath.   Cardiovascular:  Negative for chest pain.  Gastrointestinal:  Positive for blood in stool. Negative for abdominal pain, nausea and vomiting.    OBJECTIVE:   Temp:  [97.4 F (36.3 C)-97.9 F (36.6 C)] 97.6 F (36.4 C) (01/02 1115) Pulse Rate:  [59-72] 59 (01/02 1115) Resp:  [12-23] 15 (01/02 1115) BP: (91-117)/(48-67) 110/67 (  01/02 1115) SpO2:  [97 %-100 %] 100 % (01/02 1115) Weight:  [83.9 kg] 83.9 kg (01/01 1947)   Physical Exam Constitutional:      General: He is not in acute distress.    Appearance: He is not ill-appearing, toxic-appearing or diaphoretic.  Cardiovascular:     Rate and Rhythm: Normal rate and regular rhythm.  Pulmonary:     Effort: No respiratory distress.     Breath sounds: Normal breath sounds.  Abdominal:     General: Bowel sounds are normal.  There is no distension.     Palpations: Abdomen is soft.     Tenderness: There is no abdominal tenderness. There is no guarding.  Neurological:     Mental Status: He is alert.     Labs: Recent Labs    08/06/24 1958 08/07/24 1010  WBC 7.1 6.8  HGB 11.4* 11.6*  HCT 36.2* 35.7*  PLT 180 149*   BMET Recent Labs    08/06/24 1958  NA 137  K 5.0  CL 101  CO2 24  GLUCOSE 110*  BUN 38*  CREATININE 1.48*  CALCIUM  9.1   LFT Recent Labs    08/06/24 1958  PROT 6.7  ALBUMIN 4.4  AST 27  ALT 19  ALKPHOS 57  BILITOT 0.4   PT/INR No results for input(s): LABPROT, INR in the last 72 hours.  Diagnostic imaging: CT Angio Abd/Pel W and/or Wo Contrast Result Date: 08/06/2024 EXAM: CTA ABDOMEN AND PELVIS WITHOUT AND WITH CONTRAST 08/06/2024 10:46:06 PM TECHNIQUE: CTA images of the abdomen and pelvis without and with intravenous contrast. Three-dimensional MIP/volume rendered formations were performed. Automated exposure control, iterative reconstruction, and/or weight based adjustment of the mA/kV was utilized to reduce the radiation dose to as low as reasonably achievable. COMPARISON: CT abdomen and pelvis 01/28/2024. CLINICAL HISTORY: Lower GI bleed. FINDINGS: VASCULATURE: No acute finding. AORTA: Severe atherosclerotic calcifications of the aorta. No acute finding. No abdominal aortic aneurysm. No dissection. CELIAC TRUNK: No acute finding. No occlusion or significant stenosis. SUPERIOR MESENTERIC ARTERY: No acute finding. No occlusion or significant stenosis. RENAL ARTERIES: No acute finding. No occlusion or significant stenosis. ILIAC ARTERIES: Severe atherosclerotic calcifications of the iliac arteries. No acute finding. No occlusion or significant stenosis. LIVER: The liver is unremarkable. GALLBLADDER AND BILE DUCTS: Gallstones are present. No biliary ductal dilatation. SPLEEN: Low density subcapsular fluid collection measuring 61 HU is small in size measuring less than 1 cm along  the lateral aspect of the spleen. Minimal splenic calcifications are present. No evidence for laceration. No surrounding stranding or free fluid. PANCREAS: The pancreas is unremarkable. ADRENAL GLANDS: Indeterminate left adrenal nodule is unchanged measuring 12 mm. The right adrenal gland demonstrates no acute abnormality. KIDNEYS, URETERS AND BLADDER: There is a single punctate left renal calculus. Bilateral renal cysts and hypodensities which are too small to characterize are again noted measuring up to 2.1 cm. Mildly hyperdense rounded area in the superior pole of the left kidney measures 8 mm, unchanged. No stones in the ureters. No hydronephrosis. No perinephric or periureteral stranding. There is diffuse bladder wall thickening. GI AND BOWEL: Stomach and duodenal sweep demonstrate no acute abnormality. There is some mild focal wall thickening in the proximal transverse colon image 16/56 which is indeterminate. No surrounding inflammation. No active gastrointestinal bleeding identified. There are sigmoid colon diverticula. The appendix appears normal. There is no bowel obstruction. No abnormal bowel wall thickening or distension. REPRODUCTIVE: Prostate gland is mildly enlarged. PERITONEUM AND RETROPERITONEUM: No ascites or free  air. LUNG BASE: No acute abnormality. LYMPH NODES: No lymphadenopathy. BONES AND SOFT TISSUES: Degenerative changes affect the spine and hips. There are small fat containing inguinal hernias. No acute abnormality of the bones. No acute soft tissue abnormality. IMPRESSION: 1. No active gastrointestinal bleeding identified. 2. Mild focal wall thickening in the proximal transverse colon, indeterminate; recommend colonoscopy for further evaluation. 3. Diffuse bladder wall thickening, nonspecific. 4. Indeterminate 1.2 cm left adrenal nodule, unchanged; recommend adrenal washout CT or chemical-shift MRI for characterization. 5. Mildly hyperdense 8 mm left renal upper pole lesion, unchanged and  too small to characterize; recommend renal protocol MRI (preferred) or CT without and with contrast in 6-12 months. 6. Cholelithiasis. 7. Severe atherosclerotic calcifications of the aorta and iliac arteries. Electronically signed by: Greig Pique MD 08/06/2024 11:28 PM EST RP Workstation: [id number]   IMPRESSION: Painless hematochezia Diverticulosis based on CT imaging Acute blood loss anemia Coronary artery disease Acute kidney injury  PLAN: -At length discussion with patient regarding etiology of his rectal bleeding, suspect diverticular in nature based on clinical presentation and CT imaging -Discussed consideration for colonoscopy if rectal bleeding were to persist, he is apprehensive to proceed to colonoscopy given his cardiac history, would only consider this if emergent -Given above, recommend supportive care, will advance diet today, trend H/H, transfuse for hemoglobin less than 7, monitor bowel movements, if clinically stable and hemoglobin remained stable, can consider discharge this evening or tomorrow a.m. from GI perspective   LOS: 0 days   Estefana Keas, [facility] Gastroenterology

## 2024-08-07 NOTE — H&P (Signed)
 " History and Physical    David [name] FMW:[id number] DOB: October 06, 1932 DOA: 08/06/2024  PCP: Loreli Kins, MD   Chief Complaint: Rectal bleeding  HPI: David [name] is a 89 y.o. male with medical history significant of CAD, GERD, hypertension, ischemic cardiomyopathy presents emergency department rectal bleeding.  Patient has been having 4 episodes of rectal bleeding prompting presentation to the ER.  On arrival she was afebrile and hemodynamically stable.  Labs were obtained on presentation which showed creatinine 1.4 baseline, hemoglobin 11.4 baseline around 12.  She underwent CT abdomen which showed no acute findings.  There was some wall thickening in the transverse colon.  GI was consulted in the emergency department and patient was noted further workup.  On admission she was resting comfortably without complaints.   Review of Systems: Review of Systems  Constitutional:  Positive for diaphoresis. Negative for chills and fever.  HENT: Negative.    Eyes: Negative.   Respiratory: Negative.    Cardiovascular: Negative.   Gastrointestinal: Negative.   Genitourinary: Negative.   Musculoskeletal: Negative.   Skin: Negative.   Neurological: Negative.   Endo/Heme/Allergies: Negative.   Psychiatric/Behavioral: Negative.    All other systems reviewed and are negative.    As per HPI otherwise 10 point review of systems negative.   Allergies[1]  Past Medical History:  Diagnosis Date   Arthritis    hands (10/19/2013)   Bilateral carotid bruits 04/08/2020   Right greater than left   Bladder tumor    CAD (coronary artery disease)    cardiologist-  dr crouitoru   Carotid artery disease    moderate pRICA 60-79%,  1-39% LICA , >50% RECA last duplex 08-05-2015   Cataract of both eyes    Complete heart block (HCC)    GERD (gastroesophageal reflux disease)    Glaucoma, both eyes    Hematuria    History of MI (myocardial infarction)    1989   Hyperlipidemia    Hypertension     Ischemic cardiomyopathy    ef 40-45%  without clinical heart failure per cardiology note 12-28-2015 (nuclear stress ef 43% from 07-28-2015)   Left ventricular dysfunction    Non Hodgkin's lymphoma (HCC) dx 06/2010---  oncologist-  dr cloretta---  in clinical remission since 2013   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 -- lymphadenopathy to neck, mediastinal , and abdominal--  chemotherapy completed 06/ 2011   NSVT (nonsustained ventricular tachycardia) [facility]) cardiologist-  dr crouitoru   Max rate 200 bpm, 28-beat, nearly 9-second episode recorded by pacemaker on November 15, asymptomatic    Pacemaker pacemaker dependant due to CHB   1st Pacemaker Placement 1998 w/ New generator  12-31-2000 ; 08-04-2008;  11-29-2015   PAD (peripheral artery disease)    followed by dr berry--- last duplex 08-05-2015  patents bilateral popliteal stents, bilateral SFA 30-49% stenosis   Peripheral vascular disease with claudication    dx 2009 left popliteal occlusion with collaterals; 03- 2015  s/p  stenting right popliteal  by dr berry and left popliteal stenting by dr myra ([facility]) july 2015   S/P CABG x 5    09-10-1988   S/P drug eluting coronary stent placement 01/04/2006   01-04-2006--- DES to SVG to OM1, OM2, and IR, and SVG to PDA(x2 grafts)  and 03-04-2013  DES to SVR to OM   Wears glasses     Past Surgical History:  Procedure Laterality Date   ANAL FISSURE REPAIR  12-09-2000 and 1970's  CARDIAC CATHETERIZATION  04/09/2000   patent grafts   CARDIAC PACEMAKER PLACEMENT  11-15-1993   dr lucas   CARDIOVASCULAR STRESS TEST  07-28-2015  dr crouitoru   Intermediate risk nuclear study w/ large, severe, predominantly fixed inferior and apical defect; minimal reversibility inferior lateral wall; findings consistent w/ large prior infarct and trivial peri-infarct ischemia;  ef 43% w/ inferior hypokinesis and apical akinesis; mild LVE   CORONARY ANGIOPLASTY WITH STENT PLACEMENT   01-04-2006  dr gamble   DES to pSVG to OM1, SVG to OM2 &  IR, and mSVG to RCA dPDA (x2 grafts );  pLAD , pLCFX and  mRCA occluded;  LIMA to LAD mid-distal patent with occlusion beyond LIMA with collateral flow;  ef 30%   CORONARY ARTERY BYPASS GRAFT  09/10/1988   dr tanda   CABG X5 (03/04/2013) LIMA to LAD,SVG to imtermediate OM1 OM II,SVG to PDA   CORONARY BALLOON ANGIOPLASTY N/A 03/05/2023   Procedure: CORONARY BALLOON ANGIOPLASTY;  Surgeon: Dann Candyce RAMAN, MD;  Location: [facility] INVASIVE CV LAB;  Service: Cardiovascular;  Laterality: N/A;   CYSTOSCOPY W/ RETROGRADES Bilateral 04/13/2016   Procedure: CYSTOSCOPY WITH RETROGRADE PYELOGRAM;  Surgeon: Ricardo Likens, MD;  Location: [facility];  Service: Urology;  Laterality: Bilateral;   EP IMPLANTABLE DEVICE N/A 11/29/2015   Procedure: Pacemaker Implant;  Surgeon: Jerel Balding, MD;  Location: [facility] INVASIVE CV LAB;  Service: Cardiovascular;  Laterality: N/A;  new generator and new atrial, vertricular leads;  [employer] Accolade   LEFT HEART CATH AND CORS/GRAFTS ANGIOGRAPHY N/A 03/05/2023   Procedure: LEFT HEART CATH AND CORS/GRAFTS ANGIOGRAPHY;  Surgeon: Dann Candyce RAMAN, MD;  Location: [facility] INVASIVE CV LAB;  Service: Cardiovascular;  Laterality: N/A;   LEFT HEART CATHETERIZATION WITH CORONARY/GRAFT ANGIOGRAM N/A 03/04/2013   Procedure: LEFT HEART CATHETERIZATION WITH EL BILE;  Surgeon: Debby DELENA Sor, MD;  Location: [facility] CATH LAB;  Service: Cardiovascular;  Laterality: N/A;   LOWER EXTREMITY ANGIOGRAM N/A 08/31/2013   Procedure: LOWER EXTREMITY ANGIOGRAM;  Surgeon: Dorn JINNY Lesches, MD;  Location: [facility] CATH LAB;  Service: Cardiovascular;  Laterality: N/A;   PACEMAKER REPLACEMENT  12/31/2000;  08-04-2008   PERCUTANEOUS CORONARY STENT INTERVENTION (PCI-S)  03/04/2013   Procedure: PERCUTANEOUS CORONARY STENT INTERVENTION (PCI-S);  Surgeon: Debby DELENA Sor, MD;  Location: [facility] CATH LAB;  Service: Cardiovascular;;  DES to S-OM  for in-stent restenosis   POPLITEAL ARTERY STENT Right 10-21-2013  dr berry   atherectomy, PTA , and IDEV stent x1   POPLITEAL ARTERY STENT Left 07/ 2015  dr myra at [facility]   angioplasty / stent x2   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   SUPERFICIAL LYMPH NODE BIOPSY / EXCISION Left 2011   cervical lymph node   TRANSURETHRAL RESECTION OF BLADDER TUMOR N/A 04/13/2016   Procedure: TRANSURETHRAL RESECTION OF BLADDER TUMOR (TURBT) with clot evacuation;  Surgeon: Ricardo Likens, MD;  Location: [facility];  Service: Urology;  Laterality: N/A;   VENOGRAM Left 11/29/2015   Procedure: Venogram;  Surgeon: Jerel Balding, MD;  Location: [facility] INVASIVE CV LAB;  Service: Cardiovascular;  Laterality: Left;     reports that he quit smoking about 68 years ago. His smoking use included cigarettes. He started smoking about 69 years ago. He has a 0.1 pack-year smoking history. He has never used smokeless tobacco. He reports that he does not drink alcohol  and does not use drugs.  Family History  Problem Relation Age of Onset   Diabetes  Mother    Stroke Mother    Heart attack Father    Hypertension Sister    Hyperlipidemia Sister    Hypertension Sister    Hyperlipidemia Sister     Prior to Admission medications  Medication Sig Start Date End Date Taking? Authorizing Provider  aspirin  EC 81 MG tablet Take 81 mg by mouth daily.   Yes [provider]  empagliflozin  (JARDIANCE ) 10 MG TABS tablet TAKE 1 TABLET BY MOUTH DAILY 07/15/24  Yes Croitoru, Mihai, MD  fluticasone  (FLONASE ) 50 MCG/ACT nasal spray Place 2 sprays into both nostrils daily. 05/14/24  Yes Masciello, Hadassah BROCKS, MD  furosemide  (LASIX ) 40 MG tablet Take 1 tablet (40 mg total) by mouth every other day. 03/26/24  Yes Croitoru, Mihai, MD  icosapent  Ethyl (VASCEPA ) 1 g capsule TAKE TWO CAPSULES BY MOUTH TWICE DAILY 05/22/24  Yes Croitoru, Mihai, MD  isosorbide  mononitrate (IMDUR ) 30 MG 24 hr tablet TAKE  3 TABLETS BY MOUTH TWICE  DAILY 01/30/24  Yes West, Katlyn D, NP  metoprolol  (TOPROL -XL) 200 MG 24 hr tablet Take 1 tablet (200 mg total) by mouth daily. Take with or immediately following a meal. 01/08/24  Yes Croitoru, Mihai, MD  nitroGLYCERIN  (NITROSTAT ) 0.4 MG SL tablet PLACE 1 TABLET UNDER THE TONGUE EVERY 5 MINUTES AS NEEDED FOR CHEST PAIN 02/21/24  Yes Croitoru, Mihai, MD  rosuvastatin  (CRESTOR ) 40 MG tablet TAKE 1 TABLET BY MOUTH AT  BEDTIME 05/06/24  Yes Croitoru, Mihai, MD  sacubitril -valsartan  (ENTRESTO ) 24-26 MG Take 1 tablet by mouth 2 (two) times daily. Start taking 48 hours after stopping Lisinopril . 01/08/24  Yes Croitoru, Mihai, MD  spironolactone  (ALDACTONE ) 25 MG tablet Take 0.5 tablets (12.5 mg total) by mouth daily. 03/26/24  Yes Croitoru, Mihai, MD  triamcinolone  cream (KENALOG ) 0.1 % Apply 1 Application topically as needed. 04/22/23  Yes [provider]    Physical Exam: Vitals:   08/07/24 0245 08/07/24 0315 08/07/24 0330 08/07/24 0415  BP: (!) 92/56 108/63 104/60 108/61  Pulse: (!) 59 60 60 (!) 59  Resp: 12 14 16 15   Temp:      TempSrc:      SpO2: 98% 98% 97% 98%  Weight:      Height:       Physical Exam Constitutional:      Appearance: He is normal weight.  HENT:     Head: Normocephalic.     Nose: Nose normal.     Mouth/Throat:     Mouth: Mucous membranes are moist.     Pharynx: Oropharynx is clear.  Eyes:     Conjunctiva/sclera: Conjunctivae normal.     Pupils: Pupils are equal, round, and reactive to light.  Cardiovascular:     Rate and Rhythm: Normal rate and regular rhythm.     Pulses: Normal pulses.     Heart sounds: Normal heart sounds.  Pulmonary:     Effort: Pulmonary effort is normal.     Breath sounds: Normal breath sounds.  Abdominal:     General: Abdomen is flat. Bowel sounds are normal.  Musculoskeletal:        General: Normal range of motion.     Cervical back: Normal range of motion.  Skin:    General: Skin is warm.     Capillary  Refill: Capillary refill takes less than 2 seconds.  Neurological:     General: No focal deficit present.     Mental Status: He is alert.  Psychiatric:  Mood and Affect: Mood normal.        Labs on Admission: I have personally reviewed the patients's labs and imaging studies.  Assessment/Plan Principal Problem:   Rectal bleeding   # Rectal bleeding most likely secondary to ischemic colitis versus diverticular bleeding versus hemorrhoids - Hemoglobin relatively stable - No recurrent episodes in emergency department  Plan: Trend hemoglobin Clear liquid diet Appreciate GI recommendations  # CAD-continue aspirin , Imdur   # Hypertension-continue metoprolol   # Ischemic cardiomyopathy-continue Entresto , Aldactone , statin   Admission status: Inpatient Med-Surg  Certification: The appropriate patient status for this patient is INPATIENT. Inpatient status is judged to be reasonable and necessary in order to provide the required intensity of service to ensure the patient's safety. The patient's presenting symptoms, physical exam findings, and initial radiographic and laboratory data in the context of their chronic comorbidities is felt to place them at high risk for further clinical deterioration. Furthermore, it is not anticipated that the patient will be medically stable for discharge from the hospital within 2 midnights of admission.   * I certify that at the point of admission it is my clinical judgment that the patient will require inpatient hospital care spanning beyond 2 midnights from the point of admission due to high intensity of service, high risk for further deterioration and high frequency of surveillance required.DEWAINE Lamar Dess MD [facility] If 7PM-7AM, please contact night-coverage [email or web address]  08/07/2024, 5:01 AM        [1]  Allergies Allergen Reactions   Azithromycin Diarrhea   Lactose Intolerance (Gi) Diarrhea   Niacin And Related  Diarrhea   Sulfa Antibiotics Rash   "

## 2024-08-07 NOTE — Progress Notes (Signed)
 NEW ADMISSION NOTE New Admission Note:   Arrival Method: w/c and walked to bed c assistance Mental Orientation: alert and oriented x 4 Telemetry: not ordered Assessment: Completed Skin:intact IV: NSL BUE Pain: denies; has frequent chest pain at home at night for which he takes nitroglycerin  for almost daily. Pt and wife says he has discussed this with his doctor in detail. This is with exertion/changing positions. He reported chest pain of less than a minute with movement into room but said it was completely gone and did not need any nitroglycerin . Tubes: none Safety Measures: Safety Fall Prevention Plan has been given, discussed and signed Admission: Completed [facility] Orientation: Patient has been orientated to the room, unit and staff.  Family: wife and daughter present and they took his wedding ring  Call light has been placed within reach and bed alarm has been activated.   Orah Sonnen A Proctor-Gann, RN

## 2024-08-07 NOTE — ED Notes (Signed)
 Pt up sitting in recliner chair.

## 2024-08-07 NOTE — Progress Notes (Signed)
 " PROGRESS NOTE    David [name]  FMW:[id number] DOB: [birth year] DOA: 08/06/2024 PCP: Loreli Kins, MD  Outpatient Specialists:     Brief Narrative:  As per H&P done on presentation:  David [name] is a 89 y.o. male with medical history significant of CAD, GERD, hypertension, ischemic cardiomyopathy presents emergency department rectal bleeding.  Patient has been having 4 episodes of rectal bleeding prompting presentation to the ER.  On arrival she was afebrile and hemodynamically stable.  Labs were obtained on presentation which showed creatinine 1.4 baseline, hemoglobin 11.4 baseline around 12.  She underwent CT abdomen which showed no acute findings.  There was some wall thickening in the transverse colon.  GI was consulted in the emergency department and patient was noted further workup.  On admission she was resting comfortably without complaints.  08/07/2024: Patient seen alongside patient's wife.  GI input is appreciated.  No further rectal bleeding reported.  Patient is not keen on colonoscopy.  Continue to manage supportively for likely diverticular bleed.  Hemoglobin is greater than 11 g/dL.   Assessment & Plan:   Principal Problem:   Rectal bleeding   Rectal bleeding/likely diverticular bleed: - GI input is appreciated. - Continue to monitor H/H. - Avoid constipation. - Patient is not keen on colonoscopy.     # CAD-continue aspirin , Imdur    # Hypertension-continue metoprolol    # Ischemic cardiomyopathy-continue Entresto , Aldactone , statin       DVT prophylaxis:  Code Status: Full code Family Communication: Wife by bedside Disposition Plan: Likely discharge back home   Consultants:  GI  Procedures:  None  Antimicrobials:  None   Subjective: No further rectal bleeding  Objective: Vitals:   08/07/24 0415 08/07/24 0539 08/07/24 1115 08/07/24 1430  BP: 108/61 116/65 110/67 104/65  Pulse: (!) 59 61 (!) 59 61  Resp: 15 17 15 16   Temp:  (!) 97.4 F  (36.3 C) 97.6 F (36.4 C) (!) 97.5 F (36.4 C)  TempSrc:  Oral Oral Oral  SpO2: 98% 100% 100% 100%  Weight:      Height:       No intake or output data in the 24 hours ending 08/07/24 1541 Filed Weights   08/06/24 1947  Weight: 83.9 kg    Examination:  General exam: Appears calm and comfortable.  Patient is pale Respiratory system: Clear to auscultation. Respiratory effort normal. Cardiovascular system: S1 & S2 heard Gastrointestinal system: Abdomen is soft and nontender.  Central nervous system: Alert and oriented.  Extremities: No leg edema  Data Reviewed: I have personally reviewed following labs and imaging studies  CBC: Recent Labs  Lab 08/06/24 1958 08/07/24 1010  WBC 7.1 6.8  NEUTROABS  --  5.1  HGB 11.4* 11.6*  HCT 36.2* 35.7*  MCV 99.7 97.5  PLT 180 149*   Basic Metabolic Panel: Recent Labs  Lab 08/06/24 1958  NA 137  K 5.0  CL 101  CO2 24  GLUCOSE 110*  BUN 38*  CREATININE 1.48*  CALCIUM  9.1   GFR: Estimated Creatinine Clearance: 33.8 mL/min (A) (by C-G formula based on SCr of 1.48 mg/dL (H)). Liver Function Tests: Recent Labs  Lab 08/06/24 1958  AST 27  ALT 19  ALKPHOS 57  BILITOT 0.4  PROT 6.7  ALBUMIN 4.4   No results for input(s): LIPASE, AMYLASE in the last 168 hours. No results for input(s): AMMONIA in the last 168 hours. Coagulation Profile: No results for input(s): INR, PROTIME in the last 168 hours. Cardiac  Enzymes: No results for input(s): CKTOTAL, CKMB, CKMBINDEX, TROPONINI in the last 168 hours. BNP (last 3 results) No results for input(s): PROBNP in the last 8760 hours. HbA1C: No results for input(s): HGBA1C in the last 72 hours. CBG: No results for input(s): GLUCAP in the last 168 hours. Lipid Profile: No results for input(s): CHOL, HDL, LDLCALC, TRIG, CHOLHDL, LDLDIRECT in the last 72 hours. Thyroid  Function Tests: No results for input(s): TSH, T4TOTAL, FREET4, T3FREE,  THYROIDAB in the last 72 hours. Anemia Panel: No results for input(s): VITAMINB12, FOLATE, FERRITIN, TIBC, IRON, RETICCTPCT in the last 72 hours. Urine analysis:    Component Value Date/Time   COLORURINE RED (A) 05/07/2016 2345   APPEARANCEUR CLOUDY (A) 05/07/2016 2345   LABSPEC 1.008 05/07/2016 2345   LABSPEC 1.020 10/14/2009 1323   PHURINE 6.0 05/07/2016 2345   GLUCOSEU NEGATIVE 05/07/2016 2345   HGBUR LARGE (A) 05/07/2016 2345   BILIRUBINUR NEGATIVE 05/07/2016 2345   BILIRUBINUR Negative 10/14/2009 1323   KETONESUR NEGATIVE 05/07/2016 2345   PROTEINUR 100 (A) 05/07/2016 2345   UROBILINOGEN 0.2 10/20/2013 1412   NITRITE POSITIVE (A) 05/07/2016 2345   LEUKOCYTESUR LARGE (A) 05/07/2016 2345   LEUKOCYTESUR Negative 10/14/2009 1323   Sepsis Labs: @LABRCNTIP (procalcitonin:4,lacticidven:4)  )No results found for this or any previous visit (from the past 240 hours).       Radiology Studies: CT Angio Abd/Pel W and/or Wo Contrast Result Date: 08/06/2024 EXAM: CTA ABDOMEN AND PELVIS WITHOUT AND WITH CONTRAST 08/06/2024 10:46:06 PM TECHNIQUE: CTA images of the abdomen and pelvis without and with intravenous contrast. Three-dimensional MIP/volume rendered formations were performed. Automated exposure control, iterative reconstruction, and/or weight based adjustment of the mA/kV was utilized to reduce the radiation dose to as low as reasonably achievable. COMPARISON: CT abdomen and pelvis 01/28/2024. CLINICAL HISTORY: Lower GI bleed. FINDINGS: VASCULATURE: No acute finding. AORTA: Severe atherosclerotic calcifications of the aorta. No acute finding. No abdominal aortic aneurysm. No dissection. CELIAC TRUNK: No acute finding. No occlusion or significant stenosis. SUPERIOR MESENTERIC ARTERY: No acute finding. No occlusion or significant stenosis. RENAL ARTERIES: No acute finding. No occlusion or significant stenosis. ILIAC ARTERIES: Severe atherosclerotic calcifications of the iliac  arteries. No acute finding. No occlusion or significant stenosis. LIVER: The liver is unremarkable. GALLBLADDER AND BILE DUCTS: Gallstones are present. No biliary ductal dilatation. SPLEEN: Low density subcapsular fluid collection measuring 61 HU is small in size measuring less than 1 cm along the lateral aspect of the spleen. Minimal splenic calcifications are present. No evidence for laceration. No surrounding stranding or free fluid. PANCREAS: The pancreas is unremarkable. ADRENAL GLANDS: Indeterminate left adrenal nodule is unchanged measuring 12 mm. The right adrenal gland demonstrates no acute abnormality. KIDNEYS, URETERS AND BLADDER: There is a single punctate left renal calculus. Bilateral renal cysts and hypodensities which are too small to characterize are again noted measuring up to 2.1 cm. Mildly hyperdense rounded area in the superior pole of the left kidney measures 8 mm, unchanged. No stones in the ureters. No hydronephrosis. No perinephric or periureteral stranding. There is diffuse bladder wall thickening. GI AND BOWEL: Stomach and duodenal sweep demonstrate no acute abnormality. There is some mild focal wall thickening in the proximal transverse colon image 16/56 which is indeterminate. No surrounding inflammation. No active gastrointestinal bleeding identified. There are sigmoid colon diverticula. The appendix appears normal. There is no bowel obstruction. No abnormal bowel wall thickening or distension. REPRODUCTIVE: Prostate gland is mildly enlarged. PERITONEUM AND RETROPERITONEUM: No ascites or free air. LUNG BASE: No  acute abnormality. LYMPH NODES: No lymphadenopathy. BONES AND SOFT TISSUES: Degenerative changes affect the spine and hips. There are small fat containing inguinal hernias. No acute abnormality of the bones. No acute soft tissue abnormality. IMPRESSION: 1. No active gastrointestinal bleeding identified. 2. Mild focal wall thickening in the proximal transverse colon,  indeterminate; recommend colonoscopy for further evaluation. 3. Diffuse bladder wall thickening, nonspecific. 4. Indeterminate 1.2 cm left adrenal nodule, unchanged; recommend adrenal washout CT or chemical-shift MRI for characterization. 5. Mildly hyperdense 8 mm left renal upper pole lesion, unchanged and too small to characterize; recommend renal protocol MRI (preferred) or CT without and with contrast in 6-12 months. 6. Cholelithiasis. 7. Severe atherosclerotic calcifications of the aorta and iliac arteries. Electronically signed by: Greig Pique MD 08/06/2024 11:28 PM EST RP Workstation: [id number]        Scheduled Meds:  aspirin  EC  81 mg Oral Daily   isosorbide  mononitrate  90 mg Oral BID   metoprolol   200 mg Oral Daily   rosuvastatin   40 mg Oral QHS   sacubitril -valsartan   1 tablet Oral BID   spironolactone   12.5 mg Oral Daily   Continuous Infusions:  lactated ringers  75 mL/hr at 08/07/24 1146     LOS: 0 days    Time spent: 35 minutes    Leatrice Chapel, MD  [facility] Pager #: [phone] 7PM-7AM contact night coverage as above    "

## 2024-08-07 NOTE — ED Notes (Signed)
 Dorrell MD notified that pts BP is starting to trend down. BP 92/56 MAP 67. MAP goal >65 per MD.

## 2024-08-08 ENCOUNTER — Encounter: Payer: Self-pay | Admitting: Cardiovascular Disease

## 2024-08-08 DIAGNOSIS — K625 Hemorrhage of anus and rectum: Secondary | ICD-10-CM | POA: Diagnosis not present

## 2024-08-08 LAB — CBC WITH DIFFERENTIAL/PLATELET
Abs Immature Granulocytes: 0.02 K/uL (ref 0.00–0.07)
Basophils Absolute: 0 K/uL (ref 0.0–0.1)
Basophils Relative: 1 %
Eosinophils Absolute: 0.2 K/uL (ref 0.0–0.5)
Eosinophils Relative: 3 %
HCT: 37.1 % — ABNORMAL LOW (ref 39.0–52.0)
Hemoglobin: 12.5 g/dL — ABNORMAL LOW (ref 13.0–17.0)
Immature Granulocytes: 0 %
Lymphocytes Relative: 15 %
Lymphs Abs: 1 K/uL (ref 0.7–4.0)
MCH: 32.1 pg (ref 26.0–34.0)
MCHC: 33.7 g/dL (ref 30.0–36.0)
MCV: 95.1 fL (ref 80.0–100.0)
Monocytes Absolute: 0.8 K/uL (ref 0.1–1.0)
Monocytes Relative: 11 %
Neutro Abs: 4.7 K/uL (ref 1.7–7.7)
Neutrophils Relative %: 70 %
Platelets: 157 K/uL (ref 150–400)
RBC: 3.9 MIL/uL — ABNORMAL LOW (ref 4.22–5.81)
RDW: 14.1 % (ref 11.5–15.5)
WBC: 6.7 K/uL (ref 4.0–10.5)
nRBC: 0 % (ref 0.0–0.2)

## 2024-08-08 LAB — MAGNESIUM: Magnesium: 2.1 mg/dL (ref 1.7–2.4)

## 2024-08-08 LAB — RENAL FUNCTION PANEL
Albumin: 4.3 g/dL (ref 3.5–5.0)
Anion gap: 11 (ref 5–15)
BUN: 22 mg/dL (ref 8–23)
CO2: 22 mmol/L (ref 22–32)
Calcium: 9.2 mg/dL (ref 8.9–10.3)
Chloride: 105 mmol/L (ref 98–111)
Creatinine, Ser: 1.21 mg/dL (ref 0.61–1.24)
GFR, Estimated: 57 mL/min — ABNORMAL LOW
Glucose, Bld: 102 mg/dL — ABNORMAL HIGH (ref 70–99)
Phosphorus: 3 mg/dL (ref 2.5–4.6)
Potassium: 4.5 mmol/L (ref 3.5–5.1)
Sodium: 138 mmol/L (ref 135–145)

## 2024-08-08 NOTE — Progress Notes (Signed)
 Reviewed AVS, patient expressed understanding of medications, MD follow up reviewed.  Patient states all belongings brought to the hospital at time of admission are accounted for and packed to take home.  This nursing staff  transport patient to  entrance C,  where family member was waiting in vehicle to transport home.

## 2024-08-08 NOTE — Discharge Summary (Signed)
 Physician Discharge Summary  Patient ID: David [name] MRN: [id number] DOB/AGE: August 30, 1932 89 y.o.  Admit date: 08/06/2024 Discharge date: 08/08/2024  Admission Diagnoses:  Discharge Diagnoses:  Principal Problem:   -Rectal bleeding - Possible diverticular bleed   Discharged Condition: stable  Hospital Course: Patient is an 89 year old male with past medical history significant of CAD, GERD, hypertension, ischemic cardiomyopathy and prior rectal bleed.  Patient presented with another episode of rectal bleeding.  Patient was admitted and managed supportively.  Labs obtained on presentation revealed serum creatinine of 1.4 (baseline serum creatinine of 1.31), hemoglobin 11.4 g/dl (baseline hemoglobin around 12 g/dl).  Patient was managed supportively.  CT angio of abdomen and pelvis with and without contrast was not revealing (see CT result below).  GI team assisted in patient's management.  Patient has been cleared for discharge by the GI team.  Patient was not keen on colonoscopy.  Hemoglobin prior to discharge was 12.5 g/dL.  Patient will follow-up with primary care provider and GI team on discharge.  Rectal bleeding/likely diverticular bleed: - GI input is appreciated. - Patient was managed supportively. - H/H was monitored. - Hemoglobin has remained stable. - Hemoglobin prior to discharge was 12.5 g/dL. - No further bleeding reported. - Patient was not keen on colonoscopy. - GI team has cleared patient for discharge.     # CAD-continue aspirin , Imdur    # Hypertension-continue metoprolol    # Ischemic cardiomyopathy-continue Entresto , Aldactone , statin  Consults: GI.  Patient is not keen on pursuing colonoscopy.  Significant Diagnostic Studies:  -On discharge, hemoglobin was 12.5 g/dL.  CTA abdomen and pelvis with and without contrast revealed: 1. No active gastrointestinal bleeding identified. 2. Mild focal wall thickening in the proximal transverse colon,  indeterminate; recommend colonoscopy for further evaluation. 3. Diffuse bladder wall thickening, nonspecific. 4. Indeterminate 1.2 cm left adrenal nodule, unchanged; recommend adrenal washout CT or chemical-shift MRI for characterization. 5. Mildly hyperdense 8 mm left renal upper pole lesion, unchanged and too small to characterize; recommend renal protocol MRI (preferred) or CT without and with contrast in 6-12 months. 6. Cholelithiasis. 7. Severe atherosclerotic calcifications of the aorta and iliac arteries.   Electronically signed by: Greig Pique MD 08/06/2024 11:28 PM EST RP Workstation: [id number]   Discharge Exam: Blood pressure 112/63, pulse 60, temperature 97.8 F (36.6 C), temperature source Oral, resp. rate 18, height 5' 9 (1.753 m), weight 92.4 kg, SpO2 97%.   Disposition: Discharge disposition: 01-Home or Self Care       Discharge Instructions     Increase activity slowly   Complete by: As directed       Allergies as of 08/08/2024       Reactions   Azithromycin Diarrhea   Lactose Intolerance (gi) Diarrhea   Niacin And Related Diarrhea   Sulfa Antibiotics Rash        Medication List     STOP taking these medications    triamcinolone  cream 0.1 % Commonly known as: KENALOG        TAKE these medications    aspirin  EC 81 MG tablet Take 81 mg by mouth daily.   fluticasone  50 MCG/ACT nasal spray Commonly known as: FLONASE  Place 2 sprays into both nostrils daily.   furosemide  40 MG tablet Commonly known as: LASIX  Take 1 tablet (40 mg total) by mouth every other day.   isosorbide  mononitrate 30 MG 24 hr tablet Commonly known as: IMDUR  TAKE 3 TABLETS BY MOUTH TWICE  DAILY   Jardiance  10 MG Tabs tablet Generic  drug: empagliflozin  TAKE 1 TABLET BY MOUTH DAILY   metoprolol  200 MG 24 hr tablet Commonly known as: TOPROL -XL Take 1 tablet (200 mg total) by mouth daily. Take with or immediately following a meal.   nitroGLYCERIN  0.4 MG SL  tablet Commonly known as: NITROSTAT  PLACE 1 TABLET UNDER THE TONGUE EVERY 5 MINUTES AS NEEDED FOR CHEST PAIN   rosuvastatin  40 MG tablet Commonly known as: CRESTOR  TAKE 1 TABLET BY MOUTH AT  BEDTIME   sacubitril -valsartan  24-26 MG Commonly known as: ENTRESTO  Take 1 tablet by mouth 2 (two) times daily. Start taking 48 hours after stopping Lisinopril .   spironolactone  25 MG tablet Commonly known as: ALDACTONE  Take 0.5 tablets (12.5 mg total) by mouth daily.   Vascepa  1 g capsule Generic drug: icosapent  Ethyl TAKE TWO CAPSULES BY MOUTH TWICE DAILY        Follow-up Information     Loreli Kins, MD Follow up.   Specialty: Family Medicine Contact information: [address] [place] [address] [phone]         Gastroenterology, Margarete Follow up in 2 week(s).   Contact information: [address] [place] [address] [phone]                Time spent: 35 minutes.  SignedBETHA Leatrice LILLETTE Rosario 08/08/2024, 11:19 AM

## 2024-08-11 NOTE — Telephone Encounter (Signed)
 I spoke with David [name] about the colonoscopy for evaluation of recent GI bleeding.  I told him that I think he can have the colonoscopy with a low-moderate risk of serious cardiovascular complications.  I did say that it is probably wiser to have it done at the hospital, rather than in an outpatient freestanding endoscopy site.

## 2024-08-17 LAB — LAB REPORT - SCANNED
A1c: 5.5
Creatinine, POC: 123 mg/dL
EGFR: 46
Microalb Creat Ratio: 40.6
Microalbumin, Urine: 4.97

## 2024-08-24 ENCOUNTER — Encounter: Payer: Self-pay | Admitting: Cardiovascular Disease

## 2024-09-29 ENCOUNTER — Ambulatory Visit: Admitting: Cardiovascular Disease

## 2024-09-29 ENCOUNTER — Ambulatory Visit: Payer: Medicare Other

## 2024-10-06 ENCOUNTER — Encounter (HOSPITAL_COMMUNITY)

## 2024-10-06 ENCOUNTER — Ambulatory Visit: Admitting: Vascular Surgery

## 2024-12-29 ENCOUNTER — Ambulatory Visit: Payer: Medicare Other

## 2025-02-16 ENCOUNTER — Ambulatory Visit: Admitting: Oncology

## 2025-03-30 ENCOUNTER — Ambulatory Visit: Payer: Medicare Other

## 2025-06-29 ENCOUNTER — Ambulatory Visit: Payer: Medicare Other

## 2025-09-28 ENCOUNTER — Ambulatory Visit: Payer: Medicare Other
